# Patient Record
Sex: Male | Born: 1965 | Race: White | Hispanic: No | Marital: Married | Smoking: Former smoker
Health system: Southern US, Community
[De-identification: ages and names within clinical notes are randomized; demographics above are authoritative.]

## PROBLEM LIST (undated history)

## (undated) DIAGNOSIS — R51 Headache: Secondary | ICD-10-CM

## (undated) DIAGNOSIS — G4730 Sleep apnea, unspecified: Secondary | ICD-10-CM

## (undated) DIAGNOSIS — M810 Age-related osteoporosis without current pathological fracture: Secondary | ICD-10-CM

## (undated) DIAGNOSIS — U071 COVID-19: Secondary | ICD-10-CM

## (undated) DIAGNOSIS — K529 Noninfective gastroenteritis and colitis, unspecified: Secondary | ICD-10-CM

## (undated) DIAGNOSIS — K219 Gastro-esophageal reflux disease without esophagitis: Secondary | ICD-10-CM

## (undated) DIAGNOSIS — K222 Esophageal obstruction: Secondary | ICD-10-CM

## (undated) DIAGNOSIS — J45909 Unspecified asthma, uncomplicated: Secondary | ICD-10-CM

## (undated) DIAGNOSIS — R112 Nausea with vomiting, unspecified: Secondary | ICD-10-CM

## (undated) DIAGNOSIS — Z9889 Other specified postprocedural states: Secondary | ICD-10-CM

## (undated) DIAGNOSIS — I2510 Atherosclerotic heart disease of native coronary artery without angina pectoris: Secondary | ICD-10-CM

## (undated) DIAGNOSIS — Z8719 Personal history of other diseases of the digestive system: Secondary | ICD-10-CM

## (undated) DIAGNOSIS — E669 Obesity, unspecified: Secondary | ICD-10-CM

## (undated) DIAGNOSIS — Z87442 Personal history of urinary calculi: Secondary | ICD-10-CM

## (undated) DIAGNOSIS — I10 Essential (primary) hypertension: Secondary | ICD-10-CM

## (undated) DIAGNOSIS — R519 Headache, unspecified: Secondary | ICD-10-CM

## (undated) DIAGNOSIS — E7800 Pure hypercholesterolemia, unspecified: Secondary | ICD-10-CM

## (undated) DIAGNOSIS — R42 Dizziness and giddiness: Secondary | ICD-10-CM

## (undated) DIAGNOSIS — N400 Enlarged prostate without lower urinary tract symptoms: Secondary | ICD-10-CM

## (undated) DIAGNOSIS — T7840XA Allergy, unspecified, initial encounter: Secondary | ICD-10-CM

## (undated) DIAGNOSIS — D510 Vitamin B12 deficiency anemia due to intrinsic factor deficiency: Secondary | ICD-10-CM

## (undated) DIAGNOSIS — M1990 Unspecified osteoarthritis, unspecified site: Secondary | ICD-10-CM

## (undated) HISTORY — DX: Benign prostatic hyperplasia without lower urinary tract symptoms: N40.0

## (undated) HISTORY — PX: ESOPHAGOGASTRODUODENOSCOPY: SHX1529

## (undated) HISTORY — DX: COVID-19: U07.1

## (undated) HISTORY — PX: COLONOSCOPY: SHX174

## (undated) HISTORY — DX: Allergy, unspecified, initial encounter: T78.40XA

## (undated) HISTORY — DX: Unspecified osteoarthritis, unspecified site: M19.90

## (undated) HISTORY — PX: CARPAL TUNNEL RELEASE: SHX101

## (undated) HISTORY — PX: APPENDECTOMY: SHX54

## (undated) HISTORY — DX: Vitamin B12 deficiency anemia due to intrinsic factor deficiency: D51.0

## (undated) HISTORY — DX: Obesity, unspecified: E66.9

## (undated) HISTORY — DX: Esophageal obstruction: K22.2

## (undated) HISTORY — DX: Noninfective gastroenteritis and colitis, unspecified: K52.9

## (undated) HISTORY — DX: Dizziness and giddiness: R42

## (undated) HISTORY — DX: Age-related osteoporosis without current pathological fracture: M81.0

## (undated) HISTORY — DX: Gastro-esophageal reflux disease without esophagitis: K21.9

---
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
## 2003-08-15 ENCOUNTER — Emergency Department (HOSPITAL_COMMUNITY): Admission: EM | Admit: 2003-08-15 | Discharge: 2003-08-15 | Payer: Self-pay | Admitting: Emergency Medicine

## 2004-07-05 ENCOUNTER — Emergency Department (HOSPITAL_COMMUNITY): Admission: EM | Admit: 2004-07-05 | Discharge: 2004-07-05 | Payer: Self-pay | Admitting: *Deleted

## 2004-08-31 ENCOUNTER — Ambulatory Visit: Payer: Self-pay | Admitting: Internal Medicine

## 2004-09-03 ENCOUNTER — Ambulatory Visit: Payer: Self-pay | Admitting: Professional

## 2004-09-07 ENCOUNTER — Ambulatory Visit: Payer: Self-pay | Admitting: Professional

## 2004-10-08 ENCOUNTER — Ambulatory Visit: Payer: Self-pay | Admitting: Professional

## 2004-10-13 ENCOUNTER — Ambulatory Visit: Payer: Self-pay | Admitting: Internal Medicine

## 2004-10-15 ENCOUNTER — Ambulatory Visit: Payer: Self-pay | Admitting: Professional

## 2004-10-21 ENCOUNTER — Ambulatory Visit: Payer: Self-pay | Admitting: Cardiovascular Disease

## 2004-10-22 ENCOUNTER — Ambulatory Visit: Payer: Self-pay | Admitting: Professional

## 2004-10-26 ENCOUNTER — Ambulatory Visit: Payer: Self-pay | Admitting: Cardiology

## 2004-10-27 ENCOUNTER — Inpatient Hospital Stay (HOSPITAL_BASED_OUTPATIENT_CLINIC_OR_DEPARTMENT_OTHER): Admission: RE | Admit: 2004-10-27 | Discharge: 2004-10-27 | Payer: Self-pay | Admitting: Cardiology

## 2004-10-30 ENCOUNTER — Ambulatory Visit: Payer: Self-pay | Admitting: Cardiovascular Disease

## 2004-11-05 ENCOUNTER — Ambulatory Visit: Payer: Self-pay | Admitting: Professional

## 2004-12-01 ENCOUNTER — Ambulatory Visit: Payer: Self-pay

## 2005-01-07 ENCOUNTER — Ambulatory Visit: Payer: Self-pay | Admitting: Internal Medicine

## 2005-01-18 ENCOUNTER — Ambulatory Visit (HOSPITAL_COMMUNITY): Admission: RE | Admit: 2005-01-18 | Discharge: 2005-01-18 | Payer: Self-pay | Admitting: Internal Medicine

## 2006-01-31 ENCOUNTER — Ambulatory Visit: Payer: Self-pay | Admitting: Internal Medicine

## 2007-05-15 ENCOUNTER — Encounter: Payer: Self-pay | Admitting: *Deleted

## 2010-02-24 ENCOUNTER — Emergency Department (HOSPITAL_COMMUNITY): Admission: EM | Admit: 2010-02-24 | Discharge: 2010-02-24 | Payer: Self-pay | Admitting: Emergency Medicine

## 2010-12-14 LAB — DIFFERENTIAL
Basophils Absolute: 0 10*3/uL (ref 0.0–0.1)
Basophils Relative: 1 % (ref 0–1)
Eosinophils Absolute: 0.1 10*3/uL (ref 0.0–0.7)
Eosinophils Relative: 2 % (ref 0–5)
Lymphocytes Relative: 31 % (ref 12–46)
Lymphs Abs: 2.1 10*3/uL (ref 0.7–4.0)
Monocytes Absolute: 0.5 10*3/uL (ref 0.1–1.0)
Monocytes Relative: 8 % (ref 3–12)
Neutro Abs: 3.9 10*3/uL (ref 1.7–7.7)
Neutrophils Relative %: 59 % (ref 43–77)

## 2010-12-14 LAB — BASIC METABOLIC PANEL
BUN: 16 mg/dL (ref 6–23)
CO2: 27 mEq/L (ref 19–32)
Calcium: 9.4 mg/dL (ref 8.4–10.5)
Chloride: 108 mEq/L (ref 96–112)
Creatinine, Ser: 0.87 mg/dL (ref 0.4–1.5)
GFR calc Af Amer: >60 mL/min (ref >60)
GFR calc non Af Amer: >60 mL/min (ref >60)
Glucose, Bld: 115 mg/dL — ABNORMAL HIGH (ref 70–99)
Potassium: 4.4 mEq/L (ref 3.5–5.1)
Sodium: 140 mEq/L (ref 135–145)

## 2010-12-14 LAB — URINALYSIS, ROUTINE W REFLEX MICROSCOPIC
Bilirubin Urine: NEGATIVE
Glucose, UA: NEGATIVE mg/dL
Hgb urine dipstick: NEGATIVE
Ketones, ur: NEGATIVE mg/dL
Nitrite: NEGATIVE
Protein, ur: NEGATIVE mg/dL
Specific Gravity, Urine: 1.021 (ref 1.005–1.030)
Urobilinogen, UA: 0.2 mg/dL (ref 0.0–1.0)
pH: 5.5 (ref 5.0–8.0)

## 2010-12-14 LAB — CBC
HCT: 40.3 % (ref 39.0–52.0)
Hemoglobin: 14.1 g/dL (ref 13.0–17.0)
MCHC: 35 g/dL (ref 30.0–36.0)
MCV: 88.6 fL (ref 78.0–100.0)
Platelets: 270 10*3/uL (ref 150–400)
RBC: 4.56 MIL/uL (ref 4.22–5.81)
RDW: 13.4 % (ref 11.5–15.5)
WBC: 6.7 10*3/uL (ref 4.0–10.5)

## 2011-02-12 NOTE — Cardiovascular Report (Signed)
Taylor Schroeder, Taylor Schroeder               ACCOUNT NO.:  [id number]   MEDICAL RECORD NO.:  [id number]          PATIENT TYPE:  OIB   LOCATION:  6501                         FACILITY:  [facility]   PHYSICIAN:  Arturo Morton. Riley Kill, M.D. [facility] OF BIRTH:  Jul 23, 1966   DATE OF PROCEDURE:  10/27/2004  DATE OF DISCHARGE:                              CARDIAC CATHETERIZATION   INDICATIONS:  Mr. 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 He has been  under quite a bit of stress, and according to his mother just ended a 6-year  relationship. He has been a three pack per day smoker. He also drinks up to  two pots of coffee a day. The patient now presents with intermittent chest  discomfort. According to history, he apparently has had Cardiolite done in  the [facility] which was negative for ischemia, although I do not have  access to this. He has a markedly positive family history. He was seen in  consultation by Dr. Charlton Haws, and referred for diagnostic cardiac  catheterization. He is cut down to one pack of smoking of cigarettes a day.   PROCEDURE:  1.  Left heart catheterization  2.  Selective coronary arteriography.  3.  Selective left ventriculography.   DESCRIPTION OF PROCEDURE:  The patient was brought to the catheterization  laboratory after informed consent, prepped and draped in usual fashion.  Through an anterior puncture, the femoral artery was entered and a 4-French  sheath inserted by Mr. Hobbs.  Central aortic and left ventricular pressures  were measured with a pigtail, ventriculography was performed in the RAO  projection. Following this coronary arteriography was performed with four  [place] standard Judkins catheters. He tolerated the procedure well and there  were no complications. He was taken to the holding area in satisfactory  clinical condition. I proceeded to the viewing room were I reviewed the  angiographic  pictures with his mother and explained the path of physiology  and acute myocardial infarction.  I have also explained this to the patient  in detail.   HEMODYNAMIC DATA:  1.  Left ventricular pressure 107/8.  2.  Left aortic pressure 111/77.  3.  No gradient or pullback across aortic valve.   ANGIOGRAPHIC DATA:  1.  The left main coronary artery is large caliber vessel. It bifurcates      into a left anterior descending artery and into a dominant circumflex      vessel. The LAD courses to the apex. The LAD demonstrates some very      minimal Luminal irregularity. There is a major diagonal branch as well.      The LAD and the diagonal branch are large caliber vessels are free of      critical disease.  2.  The circumflex is a dominant vessel. There is a proximal marginal branch      without significant focal narrowing. There is a second marginal branch      that is a large caliber vessel. It provides a significant portion of the  lateral wall. In this marginal branch is a 40-50% area of eccentric      plaquing. The overall lumen itself appears to be adequate for flow. The      AV circumflex provides flow down the third marginal branch and three      posterolateral branches including a small posterior descending branch.      Other than minor Luminal irregularity,  all these vessels are free of      critical disease. The right coronary artery is a nondominant vessel      providing a single inferior branch.   Ventriculography in the RAO projection reveals preserved global systolic  function without segmental wall motion abnormality and without significant  mitral regurgitation.   CONCLUSION:  1.  Well-preserved overall left ventricular function.  2.  40-50% eccentric plaquing in the circumflex marginal branch,  OM II      without critical focal high-grade narrowing.  3.  Other scattered Luminal irregularities as noted above.   DISPOSITION:  1.  The patient will have follow-up  with Dr. Eden Emms for further evaluation.  2.  We will recommend at least an aspirin daily.  3.  The the patient absolutely needs to stop smoking  4.  Counseling will be helpful.  5.  Other risk factor modification will be important with risk factors      identified given the patient's positive family history.   As I explained to the patient,  given the underlying plaquing in the  circumflex he is at risk for plaque rupture and acute myocardial infarction.  This will need to be kept in mind. Given his habits, I think he is at  increased risk for this. A follow-up will be with Dr. Eden Emms.      TDS/MEDQ  D:  10/27/2004  T:  10/27/2004  Job:  [id number]   cc:   Charlton Haws, M.D.   Rosalyn Gess Norins, M.D. [facility]   CV Laboratory

## 2012-08-29 ENCOUNTER — Ambulatory Visit (INDEPENDENT_AMBULATORY_CARE_PROVIDER_SITE_OTHER): Payer: 59 | Admitting: Internal Medicine

## 2012-08-29 ENCOUNTER — Other Ambulatory Visit (INDEPENDENT_AMBULATORY_CARE_PROVIDER_SITE_OTHER): Payer: 59

## 2012-08-29 ENCOUNTER — Encounter: Payer: Self-pay | Admitting: Internal Medicine

## 2012-08-29 VITALS — BP 136/84 | HR 88 | Temp 97.8°F | Resp 16 | Wt 240.5 lb

## 2012-08-29 DIAGNOSIS — R7309 Other abnormal glucose: Secondary | ICD-10-CM

## 2012-08-29 DIAGNOSIS — R739 Hyperglycemia, unspecified: Secondary | ICD-10-CM | POA: Insufficient documentation

## 2012-08-29 DIAGNOSIS — Z0000 Encounter for general adult medical examination without abnormal findings: Secondary | ICD-10-CM

## 2012-08-29 DIAGNOSIS — J209 Acute bronchitis, unspecified: Secondary | ICD-10-CM | POA: Insufficient documentation

## 2012-08-29 LAB — CBC WITH DIFFERENTIAL/PLATELET
Basophils Absolute: 0.1 10*3/uL (ref 0.0–0.1)
Basophils Relative: 1 % (ref 0.0–3.0)
Eosinophils Absolute: 0.2 10*3/uL (ref 0.0–0.7)
Eosinophils Relative: 2 % (ref 0.0–5.0)
HCT: 42.8 % (ref 39.0–52.0)
Hemoglobin: 14.4 g/dL (ref 13.0–17.0)
Lymphocytes Relative: 24.1 % (ref 12.0–46.0)
Lymphs Abs: 2.6 10*3/uL (ref 0.7–4.0)
MCHC: 33.5 g/dL (ref 30.0–36.0)
MCV: 89.5 fl (ref 78.0–100.0)
Monocytes Absolute: 0.8 10*3/uL (ref 0.1–1.0)
Monocytes Relative: 6.9 % (ref 3.0–12.0)
Neutro Abs: 7.2 10*3/uL (ref 1.4–7.7)
Neutrophils Relative %: 66 % (ref 43.0–77.0)
Platelets: 286 10*3/uL (ref 150.0–400.0)
RBC: 4.78 Mil/uL (ref 4.22–5.81)
RDW: 13.1 % (ref 11.5–14.6)
WBC: 10.9 10*3/uL — ABNORMAL HIGH (ref 4.5–10.5)

## 2012-08-29 LAB — LIPID PANEL
Cholesterol: 212 mg/dL — ABNORMAL HIGH (ref 0–200)
HDL: 53.3 mg/dL (ref >39.00)
Total CHOL/HDL Ratio: 4
Triglycerides: 103 mg/dL (ref 0.0–149.0)
VLDL: 20.6 mg/dL (ref 0.0–40.0)

## 2012-08-29 LAB — COMPREHENSIVE METABOLIC PANEL
ALT: 21 U/L (ref 0–53)
AST: 19 U/L (ref 0–37)
Albumin: 4.1 g/dL (ref 3.5–5.2)
Alkaline Phosphatase: 63 U/L (ref 39–117)
BUN: 17 mg/dL (ref 6–23)
CO2: 26 mEq/L (ref 19–32)
Calcium: 9.3 mg/dL (ref 8.4–10.5)
Chloride: 102 mEq/L (ref 96–112)
Creatinine, Ser: 1 mg/dL (ref 0.4–1.5)
GFR: 89.1 mL/min (ref >60.00)
Glucose, Bld: 100 mg/dL — ABNORMAL HIGH (ref 70–99)
Potassium: 4.4 mEq/L (ref 3.5–5.1)
Sodium: 136 mEq/L (ref 135–145)
Total Bilirubin: 0.6 mg/dL (ref 0.3–1.2)
Total Protein: 7.1 g/dL (ref 6.0–8.3)

## 2012-08-29 LAB — PSA: PSA: 1.54 ng/mL (ref 0.10–4.00)

## 2012-08-29 LAB — URINALYSIS, ROUTINE W REFLEX MICROSCOPIC
Bilirubin Urine: NEGATIVE
Hgb urine dipstick: NEGATIVE
Ketones, ur: NEGATIVE
Leukocytes, UA: NEGATIVE
Nitrite: NEGATIVE
Specific Gravity, Urine: <=1.005 (ref 1.000–1.030)
Total Protein, Urine: NEGATIVE
Urine Glucose: NEGATIVE
Urobilinogen, UA: 0.2 (ref 0.0–1.0)
pH: 6 (ref 5.0–8.0)

## 2012-08-29 LAB — TSH: TSH: 2.49 u[IU]/mL (ref 0.35–5.50)

## 2012-08-29 LAB — LDL CHOLESTEROL, DIRECT: Direct LDL: 147.6 mg/dL

## 2012-08-29 LAB — HEMOGLOBIN A1C: Hgb A1c MFr Bld: 5.7 % (ref 4.6–6.5)

## 2012-08-29 LAB — FECAL OCCULT BLOOD, GUAIAC: Fecal Occult Blood: NEGATIVE

## 2012-08-29 MED ORDER — AZITHROMYCIN 500 MG PO TABS: 500.0000 mg | ORAL_TABLET | Freq: Every day | ORAL | Status: DC

## 2012-08-29 NOTE — Patient Instructions (Signed)
Acute Bronchitis You have acute bronchitis. This means you have a chest cold. The airways in your lungs are red and sore (inflamed). Acute means it is sudden onset.  CAUSES Bronchitis is most often caused by the same virus that causes a cold. SYMPTOMS   Body aches.  Chest congestion.  Chills.  Cough.  Fever.  Shortness of breath.  Sore throat. TREATMENT  Acute bronchitis is usually treated with rest, fluids, and medicines for relief of fever or cough. Most symptoms should go away after a few days or a week. Increased fluids may help thin your secretions and will prevent dehydration. Your caregiver may give you an inhaler to improve your symptoms. The inhaler reduces shortness of breath and helps control cough. You can take over-the-counter pain relievers or cough medicine to decrease coughing, pain, or fever. A cool-air vaporizer may help thin bronchial secretions and make it easier to clear your chest. Antibiotics are usually not needed but can be prescribed if you smoke, are seriously ill, have chronic lung problems, are elderly, or you are at higher risk for developing complications.Allergies and asthma can make bronchitis worse. Repeated episodes of bronchitis may cause longstanding lung problems. Avoid smoking and secondhand smoke.Exposure to cigarette smoke or irritating chemicals will make bronchitis worse. If you are a cigarette smoker, consider using nicotine gum or skin patches to help control withdrawal symptoms. Quitting smoking will help your lungs heal faster. Recovery from bronchitis is often slow, but you should start feeling better after 2 to 3 days. Cough from bronchitis frequently lasts for 3 to 4 weeks. To prevent another bout of acute bronchitis:  Quit smoking.  Wash your hands frequently to get rid of viruses or use a hand sanitizer.  Avoid other people with cold or virus symptoms.  Try not to touch your hands to your mouth, nose, or eyes. SEEK IMMEDIATE  MEDICAL CARE IF:  You develop increased fever, chills, or chest pain.  You have severe shortness of breath or bloody sputum.  You develop dehydration, fainting, repeated vomiting, or a severe headache.  You have no improvement after 1 week of treatment or you get worse. MAKE SURE YOU:   Understand these instructions.  Will watch your condition.  Will get help right away if you are not doing well or get worse. Document Released: 10/21/2004 Document Revised: 12/06/2011 Document Reviewed: 01/06/2011 [facility] Patient Information 2013 [place], [place]. Health Maintenance, Males A healthy lifestyle and preventative care can promote health and wellness.  Maintain regular health, dental, and eye exams.  Eat a healthy diet. Foods like vegetables, fruits, whole grains, low-fat dairy products, and lean protein foods contain the nutrients you need without too many calories. Decrease your intake of foods high in solid fats, added sugars, and salt. Get information about a proper diet from your caregiver, if necessary.  Regular physical exercise is one of the most important things you can do for your health. Most adults should get at least 150 minutes of moderate-intensity exercise (any activity that increases your heart rate and causes you to sweat) each week. In addition, most adults need muscle-strengthening exercises on 2 or more days a week.   Maintain a healthy weight. The body mass index (BMI) is a screening tool to identify possible weight problems. It provides an estimate of body fat based on height and weight. Your caregiver can help determine your BMI, and can help you achieve or maintain a healthy weight. For adults 20 years and older:  A BMI below 18.5  is considered underweight.  A BMI of 18.5 to 24.9 is normal.  A BMI of 25 to 29.9 is considered overweight.  A BMI of 30 and above is considered obese.  Maintain normal blood lipids and cholesterol by exercising and minimizing your  intake of saturated fat. Eat a balanced diet with plenty of fruits and vegetables. Blood tests for lipids and cholesterol should begin at age 10 and be repeated every 5 years. If your lipid or cholesterol levels are high, you are over 50, or you are a high risk for heart disease, you may need your cholesterol levels checked more frequently.Ongoing high lipid and cholesterol levels should be treated with medicines, if diet and exercise are not effective.  If you smoke, find out from your caregiver how to quit. If you do not use tobacco, do not start.  If you choose to drink alcohol, do not exceed 2 drinks per day. One drink is considered to be 12 ounces (355 mL) of beer, 5 ounces (148 mL) of wine, or 1.5 ounces (44 mL) of liquor.  Avoid use of street drugs. Do not share needles with anyone. Ask for help if you need support or instructions about stopping the use of drugs.  High blood pressure causes heart disease and increases the risk of stroke. Blood pressure should be checked at least every 1 to 2 years. Ongoing high blood pressure should be treated with medicines if weight loss and exercise are not effective.  If you are 49 to 47 years old, ask your caregiver if you should take aspirin to prevent heart disease.  Diabetes screening involves taking a blood sample to check your fasting blood sugar level. This should be done once every 3 years, after age 72, if you are within normal weight and without risk factors for diabetes. Testing should be considered at a younger age or be carried out more frequently if you are overweight and have at least 1 risk factor for diabetes.  Colorectal cancer can be detected and often prevented. Most routine colorectal cancer screening begins at the age of 93 and continues through age 53. However, your caregiver may recommend screening at an earlier age if you have risk factors for colon cancer. On a yearly basis, your caregiver may provide home test kits to check for  hidden blood in the stool. Use of a small camera at the end of a tube, to directly examine the colon (sigmoidoscopy or colonoscopy), can detect the earliest forms of colorectal cancer. Talk to your caregiver about this at age 5, when routine screening begins. Direct examination of the colon should be repeated every 5 to 10 years through age 48, unless early forms of pre-cancerous polyps or small growths are found.  Hepatitis C blood testing is recommended for all people born from [birth year] through 1966 and any individual with known risks for hepatitis C.  Healthy men should no longer receive prostate-specific antigen (PSA) blood tests as part of routine cancer screening. Consult with your caregiver about prostate cancer screening.  Testicular cancer screening is not recommended for adolescents or adult males who have no symptoms. Screening includes self-exam, caregiver exam, and other screening tests. Consult with your caregiver about any symptoms you have or any concerns you have about testicular cancer.  Practice safe sex. Use condoms and avoid high-risk sexual practices to reduce the spread of sexually transmitted infections (STIs).  Use sunscreen with a sun protection factor (SPF) of 30 or greater. Apply sunscreen liberally and repeatedly throughout the  day. You should seek shade when your shadow is shorter than you. Protect yourself by wearing long sleeves, pants, a wide-brimmed hat, and sunglasses year round, whenever you are outdoors.  Notify your caregiver of new moles or changes in moles, especially if there is a change in shape or color. Also notify your caregiver if a mole is larger than the size of a pencil eraser.  A one-time screening for abdominal aortic aneurysm (AAA) and surgical repair of large AAAs by sound wave imaging (ultrasonography) is recommended for ages 96 to 48 years who are current or former smokers.  Stay current with your immunizations. Document Released: 03/11/2008  Document Revised: 12/06/2011 Document Reviewed: 02/08/2011 [facility] Patient Information 2013 [place], [place].

## 2012-08-29 NOTE — Progress Notes (Signed)
Subjective:    Patient ID: Taylor Schroeder, male    DOB: 1966-09-17, 46 y.o.   MRN: [id number]  Cough This is a new problem. The current episode started in the past 7 days. The problem has been unchanged. The problem occurs every few hours. The cough is productive of purulent sputum. Associated symptoms include a sore throat. Pertinent negatives include no chest pain, chills, ear congestion, ear pain, fever, headaches, heartburn, hemoptysis, myalgias, nasal congestion, postnasal drip, rash, rhinorrhea, shortness of breath, sweats, weight loss or wheezing. Nothing aggravates the symptoms. He has tried OTC cough suppressant for the symptoms. The treatment provided mild relief. There is no history of asthma, bronchiectasis, bronchitis, COPD, emphysema, environmental allergies or pneumonia.      Review of Systems  Constitutional: Negative for fever, chills, weight loss, diaphoresis, activity change, appetite change, fatigue and unexpected weight change.  HENT: Positive for sore throat. Negative for ear pain, rhinorrhea and postnasal drip.   Eyes: Negative.   Respiratory: Positive for cough. Negative for apnea, hemoptysis, choking, chest tightness, shortness of breath, wheezing and stridor.   Cardiovascular: Negative for chest pain, palpitations and leg swelling.  Gastrointestinal: Negative for heartburn, nausea, vomiting, abdominal pain, diarrhea, constipation and blood in stool.  Genitourinary: Negative.  Negative for urgency, frequency, hematuria, flank pain, decreased urine volume, enuresis and difficulty urinating.  Musculoskeletal: Positive for arthralgias (shoulders and knees). Negative for myalgias, back pain, joint swelling and gait problem.  Skin: Negative for color change, pallor, rash and wound.  Neurological: Negative for dizziness, tremors, seizures, syncope, facial asymmetry, speech difficulty, weakness, light-headedness, numbness and headaches.  Hematological: Negative for  environmental allergies and adenopathy. Does not bruise/bleed easily.  Psychiatric/Behavioral: Negative.        Objective:   Physical Exam  Vitals reviewed. Constitutional: He is oriented to person, place, and time. He appears well-developed and well-nourished. No distress.  HENT:  Head: Normocephalic and atraumatic.  Mouth/Throat: Oropharynx is clear and moist. No oropharyngeal exudate.  Eyes: Conjunctivae normal are normal. Right eye exhibits no discharge. Left eye exhibits no discharge. No scleral icterus.  Neck: Normal range of motion. Neck supple. No JVD present. No tracheal deviation present. No thyromegaly present.  Cardiovascular: Normal rate, regular rhythm, normal heart sounds and intact distal pulses.  Exam reveals no gallop and no friction rub.   No murmur heard. Pulmonary/Chest: Effort normal and breath sounds normal. No stridor. No respiratory distress. He has no wheezes. He has no rales. He exhibits no tenderness.  Abdominal: Soft. Bowel sounds are normal. He exhibits no distension and no mass. There is no tenderness. There is no rebound and no guarding. Hernia confirmed negative in the right inguinal area and confirmed negative in the left inguinal area.  Genitourinary: Rectum normal, prostate normal, testes normal and penis normal. Rectal exam shows no external hemorrhoid, no internal hemorrhoid, no fissure, no mass, no tenderness and anal tone normal. Guaiac negative stool. Prostate is not enlarged and not tender. Right testis shows no mass, no swelling and no tenderness. Right testis is descended. Left testis shows no mass, no swelling and no tenderness. Left testis is descended. Uncircumcised. No phimosis, paraphimosis, hypospadias, penile erythema or penile tenderness. No discharge found.  Musculoskeletal: Normal range of motion. He exhibits no edema and no tenderness.  Lymphadenopathy:    He has no cervical adenopathy.       Right: No inguinal adenopathy present.        Left: No inguinal adenopathy present.  Neurological: He is oriented  to person, place, and time.  Skin: Skin is warm and dry. No rash noted. He is not diaphoretic. No erythema. No pallor.  Psychiatric: He has a normal mood and affect. His behavior is normal. Judgment and thought content normal.      Lab Results  Component Value Date   WBC 6.7 02/24/2010   HGB 14.1 02/24/2010   HCT 40.3 02/24/2010   PLT 270 02/24/2010   GLUCOSE 115* 02/24/2010   NA 140 02/24/2010   K 4.4 02/24/2010   CL 108 02/24/2010   CREATININE 0.87 02/24/2010   BUN 16 02/24/2010   CO2 27 02/24/2010      Assessment & Plan:

## 2012-08-30 NOTE — Assessment & Plan Note (Signed)
Exam done Vaccines were reviewed Labs ordered Pt ed material was given 

## 2012-08-30 NOTE — Assessment & Plan Note (Signed)
I will check his A1C to see if he has developed DM II 

## 2012-08-30 NOTE — Assessment & Plan Note (Signed)
Start Zpak

## 2012-09-27 DIAGNOSIS — K529 Noninfective gastroenteritis and colitis, unspecified: Secondary | ICD-10-CM

## 2012-09-27 HISTORY — DX: Noninfective gastroenteritis and colitis, unspecified: K52.9

## 2012-10-23 ENCOUNTER — Emergency Department (HOSPITAL_COMMUNITY)
Admission: EM | Admit: 2012-10-23 | Discharge: 2012-10-24 | Disposition: A | Discharge status: Home / Self Care | Payer: 59 | Attending: Emergency Medicine | Admitting: Emergency Medicine

## 2012-10-23 ENCOUNTER — Encounter (HOSPITAL_COMMUNITY): Payer: Self-pay | Admitting: Emergency Medicine

## 2012-10-23 ENCOUNTER — Emergency Department (HOSPITAL_COMMUNITY): Payer: 59

## 2012-10-23 DIAGNOSIS — Z79899 Other long term (current) drug therapy: Secondary | ICD-10-CM | POA: Insufficient documentation

## 2012-10-23 DIAGNOSIS — R6889 Other general symptoms and signs: Secondary | ICD-10-CM

## 2012-10-23 DIAGNOSIS — Z8739 Personal history of other diseases of the musculoskeletal system and connective tissue: Secondary | ICD-10-CM | POA: Insufficient documentation

## 2012-10-23 DIAGNOSIS — R52 Pain, unspecified: Secondary | ICD-10-CM | POA: Insufficient documentation

## 2012-10-23 DIAGNOSIS — R509 Fever, unspecified: Secondary | ICD-10-CM | POA: Insufficient documentation

## 2012-10-23 DIAGNOSIS — R0989 Other specified symptoms and signs involving the circulatory and respiratory systems: Secondary | ICD-10-CM | POA: Insufficient documentation

## 2012-10-23 DIAGNOSIS — R0600 Dyspnea, unspecified: Secondary | ICD-10-CM

## 2012-10-23 DIAGNOSIS — K219 Gastro-esophageal reflux disease without esophagitis: Secondary | ICD-10-CM | POA: Insufficient documentation

## 2012-10-23 DIAGNOSIS — R0609 Other forms of dyspnea: Secondary | ICD-10-CM | POA: Insufficient documentation

## 2012-10-23 DIAGNOSIS — J45909 Unspecified asthma, uncomplicated: Secondary | ICD-10-CM | POA: Insufficient documentation

## 2012-10-23 DIAGNOSIS — Z87891 Personal history of nicotine dependence: Secondary | ICD-10-CM | POA: Insufficient documentation

## 2012-10-23 LAB — CBC WITH DIFFERENTIAL/PLATELET
Basophils Absolute: 0 10*3/uL (ref 0.0–0.1)
Basophils Relative: 0 % (ref 0–1)
Eosinophils Absolute: 0.1 10*3/uL (ref 0.0–0.7)
Eosinophils Relative: 1 % (ref 0–5)
HCT: 38.5 % — ABNORMAL LOW (ref 39.0–52.0)
Hemoglobin: 13.5 g/dL (ref 13.0–17.0)
Lymphocytes Relative: 12 % (ref 12–46)
Lymphs Abs: 1.2 10*3/uL (ref 0.7–4.0)
MCH: 30.5 pg (ref 26.0–34.0)
MCHC: 35.1 g/dL (ref 30.0–36.0)
MCV: 86.9 fL (ref 78.0–100.0)
Monocytes Absolute: 0.6 10*3/uL (ref 0.1–1.0)
Monocytes Relative: 6 % (ref 3–12)
Neutro Abs: 7.9 10*3/uL — ABNORMAL HIGH (ref 1.7–7.7)
Neutrophils Relative %: 81 % — ABNORMAL HIGH (ref 43–77)
Platelets: 200 10*3/uL (ref 150–400)
RBC: 4.43 MIL/uL (ref 4.22–5.81)
RDW: 13.3 % (ref 11.5–15.5)
WBC: 9.8 10*3/uL (ref 4.0–10.5)

## 2012-10-23 LAB — BASIC METABOLIC PANEL
BUN: 18 mg/dL (ref 6–23)
CO2: 26 mEq/L (ref 19–32)
Calcium: 8.9 mg/dL (ref 8.4–10.5)
Chloride: 101 mEq/L (ref 96–112)
Creatinine, Ser: 1.03 mg/dL (ref 0.50–1.35)
GFR calc Af Amer: >90 mL/min (ref >90)
GFR calc non Af Amer: 85 mL/min — ABNORMAL LOW (ref >90)
Glucose, Bld: 124 mg/dL — ABNORMAL HIGH (ref 70–99)
Potassium: 3.7 mEq/L (ref 3.5–5.1)
Sodium: 136 mEq/L (ref 135–145)

## 2012-10-23 MED ORDER — ACETAMINOPHEN 325 MG PO TABS
650.0000 mg | ORAL_TABLET | Freq: Once | ORAL | Status: AC
  Administered 2012-10-23: 650 mg via ORAL
  Filled 2012-10-23: qty 2

## 2012-10-23 MED ORDER — HYDROCOD POLST-CHLORPHEN POLST 10-8 MG/5ML PO LQCR
5.0000 mL | Freq: Once | ORAL | Status: AC
  Administered 2012-10-23: 5 mL via ORAL
  Filled 2012-10-23: qty 5

## 2012-10-23 NOTE — ED Notes (Signed)
Pt to ED via [facility] with reports of sudden onset of body aches, elevated temp and productive cough today while driving home from work.

## 2012-10-23 NOTE — ED Provider Notes (Signed)
History     CSN: [id number]  Arrival date & time 10/23/12  2248   First MD Initiated Contact with Patient 10/23/12 2300      Chief Complaint  Patient presents with  . Cough    (Consider location/radiation/quality/duration/timing/severity/associated sxs/prior treatment) HPI 47 year old male presents to emergency department complaining of fever, body aches, cough. Symptoms started this evening around 8:00. Patient reports he's been feeling unwell earlier in the day however, with sinus congestion. 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 He reports it hurts to breathe and cough. He did not get a flu shot this year. Patient is not a current smoker, but has history of heavy smoking. He quit 4 years ago. He reports history of asthma, arthritis. Patient takes 1000 mg of ibuprofen each morning. Last dose this morning. He has not taken anything since that time. Patient presents via EMS, received albuterol neb treatment from EMS.  No sick contacts.   Past Medical History  Diagnosis Date  . Arthritis   . GERD (gastroesophageal reflux disease)     Past Surgical History  Procedure Date  . Appendectomy     Family History  Problem Relation Age of Onset  . Hypertension Mother   . Cancer Father   . Alcohol abuse Neg Hx   . Diabetes Neg Hx   . Early death Neg Hx   . Heart disease Neg Hx   . Hyperlipidemia Neg Hx   . Kidney disease Neg Hx   . Stroke Neg Hx     History  Substance Use Topics  . Smoking status: Former Smoker -- 2.0 packs/day for 30 years    Quit date: 08/29/2008  . Smokeless tobacco: Never Used  . Alcohol Use: No      Review of Systems  See History of Present Illness; otherwise all other systems are reviewed and negative  Allergies  Fexofenadine and Meperidine hcl  Home Medications   Current Outpatient Rx  Name  Route  Sig  Dispense  Refill  . IBUPROFEN PO   Oral   Take 1 tablet by mouth every 6 (six) hours as needed. For pain        . OMEPRAZOLE MAGNESIUM 20 MG PO TBEC   Oral   Take 20 mg by mouth daily.           BP 161/91  Temp 100 F (37.8 C) (Oral)  Resp 20  SpO2 98%  Physical Exam  Nursing note and vitals reviewed. Constitutional: He is oriented to person, place, and time. He appears well-developed and well-nourished. He appears distressed (uncomfortable appearing).  HENT:  Head: Normocephalic and atraumatic.  Nose: Nose normal.  Mouth/Throat: Oropharynx is clear and moist.  Neck: Normal range of motion. Neck supple. No JVD present. No tracheal deviation present. No thyromegaly present.       No stridor  Cardiovascular: Regular rhythm, normal heart sounds and intact distal pulses.  Exam reveals no gallop and no friction rub.   No murmur heard.      Tachycardia noted  Pulmonary/Chest: No stridor. He exhibits tenderness.       Patient is tachypnea. He has cough. No wheezing rales or rhonchi noted  Abdominal: Soft. Bowel sounds are normal. He exhibits no distension and no mass. There is no tenderness. There is no rebound and no guarding.  Musculoskeletal: Normal range of motion. He exhibits no edema and no tenderness.  Lymphadenopathy:    He has no cervical adenopathy.  Neurological:  He is alert and oriented to person, place, and time. He exhibits normal muscle tone. Coordination normal.  Skin: Skin is warm and dry. No rash noted. No erythema. No pallor.    ED Course  Procedures (including critical care time)  Labs Reviewed  CBC WITH DIFFERENTIAL - Abnormal; Notable for the following:    HCT 38.5 (*)     Neutrophils Relative 81 (*)     Neutro Abs 7.9 (*)     All other components within normal limits  BASIC METABOLIC PANEL - Abnormal; Notable for the following:    Glucose, Bld 124 (*)     GFR calc non Af Amer 85 (*)     All other components within normal limits   Dg Chest Port 1 View  10/23/2012  *RADIOLOGY REPORT*  Clinical Data: Fever, cough  PORTABLE CHEST - 1 VIEW  Comparison: Off   Findings: Poor inspiratory effect.  Allowing for this, normal heart size and vascular pattern.  Lungs clear with no effusions.  IMPRESSION: No acute findings.   Original Report Authenticated By: Esperanza Heir, M.D.     Date: 10/23/2012  Rate: 139  Rhythm: sinus tachycardia  QRS Axis: normal  Intervals: normal  ST/T Wave abnormalities: normal  Conduction Disutrbances:none  Narrative Interpretation:   Old EKG Reviewed: none available    1. Flu-like symptoms   2. Dyspnea       MDM  47 year old male with acute onset of febrile illness associated with cough and chest pain. Will get chest x-ray, lab work, suspect viral infection bronchitis versus influenza. No wheezing noted. Will treat fever, cough.       Olivia Mackie, MD 10/24/12 [phone]

## 2012-10-24 MED ORDER — OSELTAMIVIR PHOSPHATE 75 MG PO CAPS
75.0000 mg | ORAL_CAPSULE | Freq: Once | ORAL | Status: AC
  Administered 2012-10-24: 75 mg via ORAL
  Filled 2012-10-24: qty 1

## 2012-10-24 MED ORDER — OXYMETAZOLINE HCL 0.05 % NA SOLN
1.0000 | Freq: Once | NASAL | Status: AC
  Administered 2012-10-24: 1 via NASAL
  Filled 2012-10-24: qty 15

## 2012-10-24 MED ORDER — OSELTAMIVIR PHOSPHATE 75 MG PO CAPS: 75.0000 mg | ORAL_CAPSULE | Freq: Two times a day (BID) | ORAL | Status: DC

## 2012-10-24 MED ORDER — HYDROCOD POLST-CHLORPHEN POLST 10-8 MG/5ML PO LQCR: 5.0000 mL | Freq: Two times a day (BID) | ORAL | Status: DC | PRN

## 2012-10-24 MED ORDER — ALBUTEROL SULFATE HFA 108 (90 BASE) MCG/ACT IN AERS
1.0000 | INHALATION_SPRAY | RESPIRATORY_TRACT | Status: DC | PRN
  Administered 2012-10-24: 2 via RESPIRATORY_TRACT
  Filled 2012-10-24: qty 6.7

## 2012-12-21 ENCOUNTER — Ambulatory Visit (INDEPENDENT_AMBULATORY_CARE_PROVIDER_SITE_OTHER): Payer: 59 | Admitting: Internal Medicine

## 2012-12-21 ENCOUNTER — Encounter: Payer: Self-pay | Admitting: Internal Medicine

## 2012-12-21 VITALS — BP 126/86 | HR 86 | Temp 98.0°F | Resp 16 | Ht 69.0 in | Wt 247.0 lb

## 2012-12-21 DIAGNOSIS — E669 Obesity, unspecified: Secondary | ICD-10-CM

## 2012-12-21 DIAGNOSIS — K219 Gastro-esophageal reflux disease without esophagitis: Secondary | ICD-10-CM

## 2012-12-21 MED ORDER — DEXLANSOPRAZOLE 60 MG PO CPDR: 60.0000 mg | DELAYED_RELEASE_CAPSULE | Freq: Every day | ORAL | Status: DC

## 2012-12-21 MED ORDER — LORCASERIN HCL 10 MG PO TABS: 1.0000 | ORAL_TABLET | Freq: Two times a day (BID) | ORAL | Status: DC

## 2012-12-21 NOTE — Progress Notes (Signed)
Subjective:    Patient ID: Taylor Schroeder, male    DOB: June 23, 1966, 47 y.o.   MRN: [id number]  Gastrophageal Reflux He complains of heartburn and water brash. He reports no abdominal pain, no belching, no chest pain, no choking, no coughing, no dysphagia, no early satiety, no globus sensation, no hoarse voice, no nausea, no sore throat, no stridor, no tooth decay or no wheezing. This is a chronic problem. The current episode started more than 1 year ago. The problem occurs frequently. The problem has been gradually worsening. The heartburn is located in the substernum. The heartburn is of mild intensity. The heartburn wakes him from sleep. The heartburn does not limit his activity. The heartburn doesn't change with position. The symptoms are aggravated by medications. Pertinent negatives include no anemia, fatigue, melena, muscle weakness, orthopnea or weight loss. He has tried a PPI (prilosec) for the symptoms. The treatment provided mild relief.      Review of Systems  Constitutional: Positive for unexpected weight change (weight gain). Negative for fever, chills, weight loss, diaphoresis, activity change, appetite change and fatigue.  HENT: Negative.  Negative for sore throat and hoarse voice.   Eyes: Negative.   Respiratory: Negative.  Negative for apnea, cough, choking, chest tightness, shortness of breath, wheezing and stridor.   Cardiovascular: Negative.  Negative for chest pain, palpitations and leg swelling.  Gastrointestinal: Positive for heartburn. Negative for dysphagia, nausea, vomiting, abdominal pain, diarrhea, constipation and melena.  Endocrine: Negative.   Genitourinary: Negative.   Musculoskeletal: Positive for arthralgias (knees). Negative for myalgias, back pain, joint swelling, gait problem and muscle weakness.  Skin: Negative.  Negative for color change, pallor, rash and wound.  Allergic/Immunologic: Negative.   Neurological: Negative.  Negative for dizziness, tremors,  weakness, light-headedness, numbness and headaches.  Hematological: Negative.  Negative for adenopathy. Does not bruise/bleed easily.  Psychiatric/Behavioral: Negative.        Objective:   Physical Exam  Vitals reviewed. Constitutional: He is oriented to person, place, and time. He appears well-developed and well-nourished. No distress.  HENT:  Head: Normocephalic and atraumatic.  Mouth/Throat: Oropharynx is clear and moist. No oropharyngeal exudate.  Eyes: Conjunctivae are normal. Right eye exhibits no discharge. Left eye exhibits no discharge. No scleral icterus.  Neck: Normal range of motion. Neck supple. No JVD present. No tracheal deviation present. No thyromegaly present.  Cardiovascular: Normal rate, regular rhythm, normal heart sounds and intact distal pulses.  Exam reveals no gallop and no friction rub.   No murmur heard. Pulmonary/Chest: Effort normal and breath sounds normal. No stridor. No respiratory distress. He has no wheezes. He has no rales. He exhibits no tenderness.  Abdominal: Soft. Bowel sounds are normal. He exhibits no distension and no mass. There is no tenderness. There is no rebound and no guarding.  Musculoskeletal: Normal range of motion. He exhibits no edema and no tenderness.  Lymphadenopathy:    He has no cervical adenopathy.  Neurological: He is oriented to person, place, and time.  Skin: Skin is warm and dry. No rash noted. He is not diaphoretic. No erythema. No pallor.  Psychiatric: He has a normal mood and affect. His behavior is normal. Judgment and thought content normal.      Lab Results  Component Value Date   WBC 9.8 10/23/2012   HGB 13.5 10/23/2012   HCT 38.5* 10/23/2012   PLT 200 10/23/2012   GLUCOSE 124* 10/23/2012   CHOL 212* 08/29/2012   TRIG 103.0 08/29/2012   HDL 53.30 08/29/2012  LDLDIRECT 147.6 08/29/2012   ALT 21 08/29/2012   AST 19 08/29/2012   NA 136 10/23/2012   K 3.7 10/23/2012   CL 101 10/23/2012   CREATININE 1.03 10/23/2012   BUN  18 10/23/2012   CO2 26 10/23/2012   TSH 2.49 08/29/2012   PSA 1.54 08/29/2012   HGBA1C 5.7 08/29/2012      Assessment & Plan:

## 2012-12-21 NOTE — Patient Instructions (Signed)

## 2012-12-24 NOTE — Assessment & Plan Note (Signed)
prilosec has not been controlling his symptoms so I have asked him to start dexilant

## 2012-12-24 NOTE — Assessment & Plan Note (Signed)
He will start diet and exercise and will see a nutritionist I have asked him to try belviq

## 2013-01-15 ENCOUNTER — Other Ambulatory Visit: Payer: Self-pay | Admitting: Internal Medicine

## 2013-01-18 ENCOUNTER — Telehealth: Payer: Self-pay | Admitting: Internal Medicine

## 2013-01-18 DIAGNOSIS — E669 Obesity, unspecified: Secondary | ICD-10-CM

## 2013-01-18 MED ORDER — LORCASERIN HCL 10 MG PO TABS: 1.0000 | ORAL_TABLET | Freq: Two times a day (BID) | ORAL | Status: DC

## 2013-01-18 NOTE — Telephone Encounter (Signed)
done

## 2013-01-18 NOTE — Telephone Encounter (Signed)
rx faxed to pharmacy

## 2013-01-18 NOTE — Telephone Encounter (Signed)
Patient requesting refill on Belviq be sent to [facility] on [address]

## 2013-01-19 ENCOUNTER — Telehealth: Payer: Self-pay

## 2013-01-19 NOTE — Telephone Encounter (Signed)
Called insurance [facility], Belviq approved x 3 mos, pharmacy notified

## 2013-01-25 HISTORY — PX: CARDIAC CATHETERIZATION: SHX172

## 2013-01-27 ENCOUNTER — Emergency Department (HOSPITAL_COMMUNITY): Payer: 59

## 2013-01-27 ENCOUNTER — Observation Stay (HOSPITAL_COMMUNITY)
Admission: EM | Admit: 2013-01-27 | Discharge: 2013-01-29 | Disposition: A | Discharge status: Home / Self Care | Payer: 59 | Attending: Cardiology | Admitting: Cardiology

## 2013-01-27 ENCOUNTER — Encounter (HOSPITAL_COMMUNITY): Payer: Self-pay | Admitting: *Deleted

## 2013-01-27 DIAGNOSIS — Z6833 Body mass index (BMI) 33.0-33.9, adult: Secondary | ICD-10-CM | POA: Insufficient documentation

## 2013-01-27 DIAGNOSIS — I2510 Atherosclerotic heart disease of native coronary artery without angina pectoris: Secondary | ICD-10-CM | POA: Insufficient documentation

## 2013-01-27 DIAGNOSIS — E669 Obesity, unspecified: Secondary | ICD-10-CM

## 2013-01-27 DIAGNOSIS — R079 Chest pain, unspecified: Principal | ICD-10-CM

## 2013-01-27 DIAGNOSIS — K219 Gastro-esophageal reflux disease without esophagitis: Secondary | ICD-10-CM | POA: Diagnosis present

## 2013-01-27 HISTORY — DX: Atherosclerotic heart disease of native coronary artery without angina pectoris: I25.10

## 2013-01-27 LAB — PRO B NATRIURETIC PEPTIDE: Pro B Natriuretic peptide (BNP): <5 pg/mL (ref 0–125)

## 2013-01-27 LAB — CBC
HCT: 40.2 % (ref 39.0–52.0)
RBC: 4.71 MIL/uL (ref 4.22–5.81)
RDW: 13.1 % (ref 11.5–15.5)
WBC: 8.6 10*3/uL (ref 4.0–10.5)

## 2013-01-27 LAB — BASIC METABOLIC PANEL
BUN: 21 mg/dL (ref 6–23)
CO2: 26 mEq/L (ref 19–32)
Chloride: 104 mEq/L (ref 96–112)
GFR calc Af Amer: >90 mL/min (ref >90)
Potassium: 3.8 mEq/L (ref 3.5–5.1)

## 2013-01-27 LAB — POCT I-STAT, CHEM 8
Hemoglobin: 11.9 g/dL — ABNORMAL LOW (ref 13.0–17.0)
Sodium: 140 mEq/L (ref 135–145)
TCO2: 26 mmol/L (ref 0–100)

## 2013-01-27 LAB — POCT I-STAT TROPONIN I

## 2013-01-27 MED ORDER — GI COCKTAIL ~~LOC~~
30.0000 mL | Freq: Once | ORAL | Status: AC
  Administered 2013-01-27: 30 mL via ORAL
  Filled 2013-01-27: qty 30

## 2013-01-27 MED ORDER — IOHEXOL 350 MG/ML SOLN
100.0000 mL | Freq: Once | INTRAVENOUS | Status: AC | PRN
  Administered 2013-01-27: 100 mL via INTRAVENOUS

## 2013-01-27 MED ORDER — ONDANSETRON HCL 4 MG/2ML IJ SOLN: 4.0000 mg | Freq: Once | INTRAMUSCULAR | Status: AC

## 2013-01-27 MED ORDER — ONDANSETRON HCL 4 MG/2ML IJ SOLN
INTRAMUSCULAR | Status: AC
  Administered 2013-01-27: 4 mg via INTRAVENOUS
  Filled 2013-01-27: qty 2

## 2013-01-27 MED ORDER — MORPHINE SULFATE 4 MG/ML IJ SOLN
INTRAMUSCULAR | Status: AC
Start: 2013-01-27 — End: 2013-01-27
  Administered 2013-01-27: 4 mg
  Filled 2013-01-27: qty 1

## 2013-01-27 MED ORDER — MORPHINE SULFATE 2 MG/ML IJ SOLN
2.0000 mg | INTRAMUSCULAR | Status: DC | PRN
  Filled 2013-01-27: qty 1

## 2013-01-27 MED ORDER — NITROGLYCERIN 0.4 MG SL SUBL
0.4000 mg | SUBLINGUAL_TABLET | SUBLINGUAL | Status: DC | PRN
  Administered 2013-01-27: 0.4 mg via SUBLINGUAL
  Filled 2013-01-27: qty 25

## 2013-01-27 MED ORDER — ASPIRIN 325 MG PO TABS
325.0000 mg | ORAL_TABLET | ORAL | Status: AC
  Administered 2013-01-27: 325 mg via ORAL
  Filled 2013-01-27: qty 1

## 2013-01-27 MED ORDER — IOHEXOL 350 MG/ML SOLN
80.0000 mL | Freq: Once | INTRAVENOUS | Status: AC | PRN
  Administered 2013-01-27: 80 mL via INTRAVENOUS

## 2013-01-27 NOTE — ED Notes (Signed)
Pt reports pain relief. Pt being transported to radiology now.

## 2013-01-27 NOTE — ED Provider Notes (Signed)
History     CSN: [id number]  Arrival date & time 01/27/13  2123   First MD Initiated Contact with Patient 01/27/13 2134      Chief Complaint  Patient presents with  . Chest Pain    HPI Sudden onset of sharp pain substernal which radiates into the back.  Pain began this afternoon got worse after eating peanuts.  Patient has had some diaphoresis.  No nausea or vomiting. Past Medical History  Diagnosis Date  . Arthritis   . GERD (gastroesophageal reflux disease)   . Coronary artery disease Non-obstructive    a. nonobs cath in 2006;  b. 01/2013 Cath: LM nl, LAD 51m, LCX nl, RCA non-dom, nl. EF 60-65%.    Past Surgical History  Procedure Laterality Date  . Appendectomy    . Cardiac catheterization      Family History  Problem Relation Age of Onset  . Hypertension Mother   . Cancer Father   . Alcohol abuse Neg Hx   . Diabetes Neg Hx   . Early death Neg Hx   . Heart disease Neg Hx   . Hyperlipidemia Neg Hx   . Kidney disease Neg Hx   . Stroke Neg Hx     History  Substance Use Topics  . Smoking status: Former Smoker -- 2.00 packs/day for 30 years    Quit date: 08/29/2008  . Smokeless tobacco: Never Used  . Alcohol Use: 1.2 oz/week    2 Glasses of wine per week      Review of Systems Level V caveat Allergies  Fexofenadine and Meperidine hcl  Home Medications   Current Outpatient Rx  Name  Route  Sig  Dispense  Refill  . Lorcaserin HCl (BELVIQ) 10 MG TABS   Oral   Take 1 tablet by mouth 2 (two) times daily.   60 tablet   5   . omeprazole (PRILOSEC OTC) 20 MG tablet   Oral   Take 20 mg by mouth every other day.         Marland Kitchen aspirin EC 81 MG EC tablet   Oral   Take 1 tablet (81 mg total) by mouth daily.         Marland Kitchen ibuprofen (ADVIL,MOTRIN) 200 MG tablet   Oral   Take 4 tablets (800 mg total) by mouth 2 (two) times daily as needed for pain.   30 tablet        BP 125/74  Pulse 74  Temp(Src) 98 F (36.7 C) (Oral)  Resp 21  Ht 5\' 9"  (1.753 m)  Wt  228 lb 6.3 oz (103.6 kg)  BMI 33.71 kg/m2  SpO2 98%  Physical Exam  Nursing note and vitals reviewed. Constitutional: He is oriented to person, place, and time. He appears well-developed and well-nourished. He appears distressed.  HENT:  Head: Normocephalic and atraumatic.  Eyes: Pupils are equal, round, and reactive to light.  Neck: Normal range of motion.  Cardiovascular: Normal rate and intact distal pulses.   Pulmonary/Chest: No respiratory distress. He has no wheezes. He has no rales.  Abdominal: Normal appearance. He exhibits no distension. There is no tenderness. There is no rebound.  Musculoskeletal: Normal range of motion.  Neurological: He is alert and oriented to person, place, and time. No cranial nerve deficit.  Skin: Skin is warm and dry. No rash noted.  Psychiatric: He has a normal mood and affect. His behavior is normal.    ED Course  Procedures (including critical care time)  Date:  01/27/2013  Rate: 96  Rhythm: normal sinus rhythm  QRS Axis: normal  Intervals: normal  ST/T Wave abnormalities: normal  Conduction Disutrbances: none  Narrative Interpretation: Poor R. wave progression anteriorly which appears to be no different than the EKG from January 2014  Meds ordered this encounter  Medications  . aspirin tablet 325 mg    Sig:   . nitroGLYCERIN (NITROSTAT) SL tablet 0.4 mg    Sig:   . morphine 4 MG/ML injection    Sig:     BULLOCK, NATALIE: cabinet override  . morphine 2 MG/ML injection 2 mg    Sig:   . ondansetron (ZOFRAN) 4 MG/2ML injection    Sig:     BULLOCK, NATALIE: cabinet override  . ibuprofen (ADVIL,MOTRIN) 200 MG tablet    Sig: Take 800 mg by mouth 2 (two) times daily.  Marland Kitchen omeprazole (PRILOSEC OTC) 20 MG tablet    Sig: Take 20 mg by mouth every other day.  . ondansetron (ZOFRAN) injection 4 mg    Sig:   . gi cocktail (Maalox,Lidocaine,Donnatal)    Sig:       Labs Reviewed  CBC - Abnormal; Notable for the following:    MCHC 36.1 (*)     All other components within normal limits  BASIC METABOLIC PANEL - Abnormal; Notable for the following:    GFR calc non Af Amer 85 (*)    All other components within normal limits  CBC WITH DIFFERENTIAL - Abnormal; Notable for the following:    HCT 36.6 (*)    MCHC 36.1 (*)    All other components within normal limits  COMPREHENSIVE METABOLIC PANEL - Abnormal; Notable for the following:    Glucose, Bld 116 (*)    All other components within normal limits  BASIC METABOLIC PANEL - Abnormal; Notable for the following:    Glucose, Bld 103 (*)    All other components within normal limits  CBC - Abnormal; Notable for the following:    HCT 38.1 (*)    All other components within normal limits  HEPARIN LEVEL (UNFRACTIONATED) - Abnormal; Notable for the following:    Heparin Unfractionated 0.26 (*)    All other components within normal limits  POCT I-STAT, CHEM 8 - Abnormal; Notable for the following:    Hemoglobin 11.9 (*)    HCT 35.0 (*)    All other components within normal limits  MRSA PCR SCREENING  PRO B NATRIURETIC PEPTIDE  TROPONIN I  TROPONIN I  TROPONIN I  PROTIME-INR  APTT  PRO B NATRIURETIC PEPTIDE  LIPID PANEL  HEPARIN LEVEL (UNFRACTIONATED)  POCT I-STAT TROPONIN I  POCT ACTIVATED CLOTTING TIME   No results found.   1. Chest pain   2. GERD (gastroesophageal reflux disease)   3. Obesity, unspecified       MDM  Patient seen and evaluated by cardiology.  Admitted to hospital.  Patient much improved after pain management.        Nelia Shi, MD 02/01/13 [phone]

## 2013-01-27 NOTE — ED Notes (Signed)
Pt states c/o Constant sharp shooting CP that radiates to his back. Pain relived by nothing.

## 2013-01-27 NOTE — Consult Note (Deleted)
Admit date: 01/27/2013 Referring Physician  Dr. Radford Pax Primary Physician  Dr. Yetta Barre Primary Cardiologist  Dr. Riley Kill Reason for Consultation  Chest pain  HPI: Thisi s a 46yo WM with a history of CAD with remote cath in 2006 showing luminal irregularities of the LAD, 40-50% eccentric second marginal of the left circ and nondominant normal RCA who presented to the ER with complaints of chest pain.  He says that the pain started around 2pm while sitting and was described as a dull aching sensation.  It was constant since 2pm and then around 7pm the pain became much worse when driving home with radiation into his back and was a sharp as well as pressure pain.  He says it felt like someone was stabbing him as well as feeling like he had a stitch in his back.  He was SOB as well and it was worth when taking a deep breath in.  He pulled over to the side of the road and vomited but did not feel better.  He arrived in the ER and was given SL NTG and morphine.  He thinks the NTG helped some.  He says that now the sharp intense pain is gone but there is still a dull ache in the midsternal part of his chest.  He denied any radiation into his arms.  Because of the radiation into his back a chest CT angio was done which did not show any aortic dissection but did show CAD of the LAD.  Currently is CP is rated a 2/10 and did not improve after GI cocktail.     PMH:   Past Medical History  Diagnosis Date  . Arthritis    CAD with 40-50% OM2 by cath 2006   . GERD (gastroesophageal reflux disease)      PSH:   Past Surgical History  Procedure Laterality Date  . Appendectomy      Allergies:  Fexofenadine and Meperidine hcl Prior to Admit Meds:   (Not in a hospital admission) Fam HX:    Family History  Problem Relation Age of Onset  . Hypertension Mother   . Cancer Father   . Alcohol abuse Neg Hx   . Diabetes Neg Hx   . Early death Neg Hx   . Heart disease Neg Hx   . Hyperlipidemia Neg Hx   . Kidney  disease Neg Hx   . Stroke Neg Hx    Social HX:    History   Social History  . Marital Status: Married    Spouse Name: N/A    Number of Children: N/A  . Years of Education: N/A   Occupational History  . Not on file.   Social History Main Topics  . Smoking status: Former Smoker -- 2.00 packs/day for 30 years    Quit date: 08/29/2008  . Smokeless tobacco: Never Used  . Alcohol Use: No  . Drug Use: No  . Sexually Active: Yes   Other Topics Concern  . Not on file   Social History Narrative  . No narrative on file     ROS:  All 11 ROS were addressed and are negative except what is stated in the HPI  Physical Exam: Blood pressure 123/77, pulse 66, resp. rate 16, SpO2 100.00%.    General: Well developed, well nourished, in no acute distress Head: Eyes PERRLA, No xanthomas.   Normal cephalic and atramatic  Lungs:   Clear bilaterally to auscultation and percussion. Heart:   HRRR S1 S2 Pulses are  2+ & equal.            No carotid bruit. No JVD.  No abdominal bruits. No femoral bruits. Abdomen: Bowel sounds are positive, abdomen soft and non-tender without masses or                  Hernia's noted. Msk:  Back normal, normal gait. Normal strength and tone for age. Extremities:   No clubbing, cyanosis or edema.  DP +1 Neuro: Alert and oriented X 3. Psych:  Good affect, responds appropriately    Labs:   Lab Results  Component Value Date   WBC 8.6 01/27/2013   HGB 11.9* 01/27/2013   HCT 35.0* 01/27/2013   MCV 85.4 01/27/2013   PLT 253 01/27/2013    Recent Labs Lab 01/27/13 2133 01/27/13 2220  NA 140 140  K 3.8 3.6  CL 104 103  CO2 26  --   BUN 21 22  CREATININE 1.03 0.90  CALCIUM 9.5  --   GLUCOSE 95 89   No results found for this basename: PTT   No results found for this basename: INR, PROTIME   No results found for this basename: CKTOTAL, CKMB, CKMBINDEX, TROPONINI     Lab Results  Component Value Date   CHOL 212* 08/29/2012   Lab Results  Component Value  Date   HDL 53.30 08/29/2012   No results found for this basename: [facility]   Lab Results  Component Value Date   TRIG 103.0 08/29/2012   Lab Results  Component Value Date   CHOLHDL 4 08/29/2012   Lab Results  Component Value Date   LDLDIRECT 147.6 08/29/2012      Radiology:  Dg Chest Port 1 View  01/27/2013  *RADIOLOGY REPORT*  Clinical Data: Chest pain  PORTABLE CHEST - 1 VIEW  Comparison: Prior chest x-ray 10/23/2012  Findings: Low inspiratory volumes with mild bibasilar atelectasis. Stable cardiomegaly.  No edema, large effusion or pneumothorax. No acute osseous abnormality.  Degenerative changes in the left glenohumeral joint.  IMPRESSION: Low inspiratory volumes with mild bibasilar atelectasis.  Stable cardiomegaly.   Original Report Authenticated By: [name], M.D.    Ct Angio Chest Aorta W/cm &/or Wo/cm  01/27/2013  *RADIOLOGY REPORT*  Clinical Data: Chest pain, evaluate for dissection  CT ANGIOGRAPHY CHEST  Technique:  Multidetector CT imaging of the chest using the standard protocol during bolus administration of intravenous contrast. Multiplanar reconstructed images including MIPs were obtained and reviewed to evaluate the vascular anatomy.  Contrast: OMNIPAQUE IOHEXOL 350 MG/ML SOLN, 80mL OMNIPAQUE IOHEXOL 350 MG/ML SOLN  Comparison: Chest x-ray obtained earlier today  Findings:  Mediastinum: Unremarkable CT appearance of the thyroid gland.  No suspicious mediastinal or hilar adenopathy.  No soft tissue mediastinal mass.  The thoracic esophagus is unremarkable.  Heart/Vascular: No high attenuation within the aortic media on the noncontrasted view to suggest intramural hematoma. There is a bovine configuration of the aortic arch (two vessel arch with common origin of the brachiocephalic and left common carotid arteries), a normal anatomic variant.  No aortic dissection or dilatation.  Adequate opacification of the pulmonary arteries to the subsegmental level.  No evidence of  central filling defect to suggest acute pulmonary embolism.  Heart is within normal limits for size.  No pericardial effusion.  Probable trace atherosclerotic calcifications in the left anterior descending coronary artery.  Lungs/Pleura: Trace biapical emphysema.  Mild dependent atelectasis in the lower lobes.  Otherwise, the lungs are clear.  Upper Abdomen: Small  stone noted in the gallbladder neck/proximal cystic duct.  No significant gallbladder wall thickening or distension.  Bones: No acute fracture or aggressive appearing lytic or blastic osseous lesion.  IMPRESSION:  1.  Negative for acute dissection, intramural hematoma, pulmonary embolism or other acute cardiopulmonary abnormality. 2. Probable coronary artery disease as identified by the presence of calcified vascular plaque in the left anterior descending coronary artery 3.  Trace biapical pulmonary emphysema 4.  Cholelithiasis   Original Report Authenticated By: [name], M.D.     EKG:  NSR with no ST changes  ASSESSMENT:  1.  Chest pain syndrome with typical and atypical features in a patient with history of nonobstructive left circumflex disease and chest CT showing coronary artery calcifications in the LAD.  He has 2/10 CP that persists but EKG is normal and initiial cardiac enzymes are negative. 2.  GERD 3.  Arthritis  PLAN:   1.  Admit to tele bed 2.  Cycle cardiac enzymes 3.  IV NTG 4.  IV Heparin gtt 5.  ASA/beta blocker 6.  Check d-dimer 7.  Cardiac cath on Monday to evaluate coronary anatomy 8.  Check fasting lipid panel in am  Quintella Reichert, MD  01/27/2013  11:56 PM

## 2013-01-27 NOTE — ED Notes (Signed)
CT scan unsuccessful during 1st try, had to redo a 2nd time.

## 2013-01-28 ENCOUNTER — Encounter (HOSPITAL_COMMUNITY): Payer: Self-pay | Admitting: *Deleted

## 2013-01-28 DIAGNOSIS — R079 Chest pain, unspecified: Secondary | ICD-10-CM

## 2013-01-28 LAB — COMPREHENSIVE METABOLIC PANEL
ALT: 18 U/L (ref 0–53)
AST: 14 U/L (ref 0–37)
Alkaline Phosphatase: 59 U/L (ref 39–117)
Calcium: 9.1 mg/dL (ref 8.4–10.5)
Potassium: 4 mEq/L (ref 3.5–5.1)
Sodium: 137 mEq/L (ref 135–145)
Total Protein: 6.5 g/dL (ref 6.0–8.3)

## 2013-01-28 LAB — PROTIME-INR
INR: 1.05 (ref 0.00–1.49)
Prothrombin Time: 13.6 seconds (ref 11.6–15.2)

## 2013-01-28 LAB — CBC WITH DIFFERENTIAL/PLATELET
Basophils Absolute: 0 10*3/uL (ref 0.0–0.1)
Eosinophils Absolute: 0.1 10*3/uL (ref 0.0–0.7)
Eosinophils Relative: 1 % (ref 0–5)
Lymphocytes Relative: 24 % (ref 12–46)
MCH: 30.3 pg (ref 26.0–34.0)
MCV: 84.1 fL (ref 78.0–100.0)
Neutrophils Relative %: 69 % (ref 43–77)
Platelets: 217 10*3/uL (ref 150–400)
RBC: 4.35 MIL/uL (ref 4.22–5.81)
RDW: 13.1 % (ref 11.5–15.5)
WBC: 9.1 10*3/uL (ref 4.0–10.5)

## 2013-01-28 LAB — HEPARIN LEVEL (UNFRACTIONATED): Heparin Unfractionated: 0.34 IU/mL (ref 0.30–0.70)

## 2013-01-28 LAB — LIPID PANEL
HDL: 50 mg/dL (ref >39)
LDL Cholesterol: 87 mg/dL (ref 0–99)
Total CHOL/HDL Ratio: 3.1 RATIO
Triglycerides: 89 mg/dL (ref <150)
VLDL: 18 mg/dL (ref 0–40)

## 2013-01-28 LAB — BASIC METABOLIC PANEL
Calcium: 9.2 mg/dL (ref 8.4–10.5)
Creatinine, Ser: 0.97 mg/dL (ref 0.50–1.35)
GFR calc Af Amer: >90 mL/min (ref >90)
GFR calc non Af Amer: >90 mL/min (ref >90)

## 2013-01-28 LAB — TROPONIN I
Troponin I: <0.3 ng/mL (ref <0.30)
Troponin I: <0.3 ng/mL (ref <0.30)

## 2013-01-28 MED ORDER — ONDANSETRON HCL 4 MG/2ML IJ SOLN: 4.0000 mg | Freq: Four times a day (QID) | INTRAMUSCULAR | Status: DC | PRN

## 2013-01-28 MED ORDER — SODIUM CHLORIDE 0.9 % IV SOLN: 250.0000 mL | INTRAVENOUS | Status: DC | PRN

## 2013-01-28 MED ORDER — ASPIRIN EC 81 MG PO TBEC
81.0000 mg | DELAYED_RELEASE_TABLET | Freq: Every day | ORAL | Status: DC
  Administered 2013-01-29: 81 mg via ORAL
  Filled 2013-01-28: qty 1

## 2013-01-28 MED ORDER — PANTOPRAZOLE SODIUM 40 MG PO TBEC
40.0000 mg | DELAYED_RELEASE_TABLET | ORAL | Status: DC
  Administered 2013-01-28: 40 mg via ORAL
  Filled 2013-01-28: qty 1

## 2013-01-28 MED ORDER — NITROGLYCERIN IN D5W 200-5 MCG/ML-% IV SOLN
3.0000 ug/min | INTRAVENOUS | Status: DC
  Administered 2013-01-28: 5 ug/min via INTRAVENOUS
  Filled 2013-01-28: qty 250

## 2013-01-28 MED ORDER — HEPARIN (PORCINE) IN NACL 100-0.45 UNIT/ML-% IJ SOLN
1350.0000 [IU]/h | INTRAMUSCULAR | Status: DC
  Administered 2013-01-28: 1350 [IU]/h via INTRAVENOUS
  Filled 2013-01-28 (×2): qty 250

## 2013-01-28 MED ORDER — HEPARIN (PORCINE) IN NACL 100-0.45 UNIT/ML-% IJ SOLN
1450.0000 [IU]/h | INTRAMUSCULAR | Status: DC
  Administered 2013-01-28: 1450 [IU]/h via INTRAVENOUS
  Filled 2013-01-28 (×2): qty 250

## 2013-01-28 MED ORDER — ATORVASTATIN CALCIUM 20 MG PO TABS
20.0000 mg | ORAL_TABLET | Freq: Every day | ORAL | Status: DC
  Administered 2013-01-28 – 2013-01-29 (×2): 20 mg via ORAL
  Filled 2013-01-28 (×2): qty 1

## 2013-01-28 MED ORDER — METOPROLOL TARTRATE 12.5 MG HALF TABLET
12.5000 mg | ORAL_TABLET | Freq: Two times a day (BID) | ORAL | Status: DC
  Administered 2013-01-28 – 2013-01-29 (×4): 12.5 mg via ORAL
  Filled 2013-01-28 (×5): qty 1

## 2013-01-28 MED ORDER — NITROGLYCERIN 0.4 MG SL SUBL: 0.4000 mg | SUBLINGUAL_TABLET | SUBLINGUAL | Status: DC | PRN

## 2013-01-28 MED ORDER — SODIUM CHLORIDE 0.9 % IJ SOLN
3.0000 mL | Freq: Two times a day (BID) | INTRAMUSCULAR | Status: DC
  Administered 2013-01-28 – 2013-01-29 (×3): 3 mL via INTRAVENOUS

## 2013-01-28 MED ORDER — HEPARIN BOLUS VIA INFUSION
4000.0000 [IU] | Freq: Once | INTRAVENOUS | Status: AC
  Administered 2013-01-28: 4000 [IU] via INTRAVENOUS
  Filled 2013-01-28: qty 4000

## 2013-01-28 MED ORDER — SODIUM CHLORIDE 0.9 % IJ SOLN: 3.0000 mL | INTRAMUSCULAR | Status: DC | PRN

## 2013-01-28 MED ORDER — OMEPRAZOLE MAGNESIUM 20 MG PO TBEC
20.0000 mg | DELAYED_RELEASE_TABLET | ORAL | Status: DC
Start: 2013-01-28 — End: 2013-01-28

## 2013-01-28 MED ORDER — ACETAMINOPHEN 325 MG PO TABS: 650.0000 mg | ORAL_TABLET | ORAL | Status: DC | PRN

## 2013-01-28 MED ORDER — SODIUM CHLORIDE 0.9 % IV SOLN
INTRAVENOUS | Status: DC
  Administered 2013-01-28: 03:00:00 via INTRAVENOUS
  Administered 2013-01-29: 10 mL/h via INTRAVENOUS

## 2013-01-28 MED ORDER — SODIUM CHLORIDE 0.9 % IV SOLN
INTRAVENOUS | Status: DC
  Administered 2013-01-29: 75 mL/h via INTRAVENOUS

## 2013-01-28 NOTE — Progress Notes (Signed)
ANTICOAGULATION CONSULT NOTE - Initial Consult  Pharmacy Consult for Heparin Indication: chest pain/ACS  Allergies  Allergen Reactions  . Fexofenadine Other (See Comments)    Unknown-reaction  . Meperidine Hcl     "bad things" happen when I take it    Patient Measurements: Height: 5' 8.9" (175 cm) Weight: 246 lb 14.6 oz (112 kg) IBW/kg (Calculated) : 70.47 Heparin Dosing Weight: 95 kg   Vital Signs: BP: 120/82 mmHg (05/04 0030) Pulse Rate: 68 (05/04 0030)  Labs:  Recent Labs  01/27/13 2133 01/27/13 2220  HGB 14.5 11.9*  HCT 40.2 35.0*  PLT 253  --   CREATININE 1.03 0.90    Estimated Creatinine Clearance: 125 ml/min (by C-G formula based on Cr of 0.9).   Medical History: Past Medical History  Diagnosis Date  . Arthritis   . GERD (gastroesophageal reflux disease)     Medications:  Prescriptions prior to admission  Medication Sig Dispense Refill  . ibuprofen (ADVIL,MOTRIN) 200 MG tablet Take 800 mg by mouth 2 (two) times daily.      . Lorcaserin HCl (BELVIQ) 10 MG TABS Take 1 tablet by mouth 2 (two) times daily.  60 tablet  5  . omeprazole (PRILOSEC OTC) 20 MG tablet Take 20 mg by mouth every other day.        Assessment: 47 yo male with chest pain for heparin Goal of Therapy:  Heparin level 0.3-0.7 units/ml Monitor platelets by anticoagulation protocol: Yes   Plan:  Heparin 4000 units IV bolus, then 1350 units/hr Check heparin level in 6 hours.  Eddie Candle 01/28/2013,1:24 AM

## 2013-01-28 NOTE — Progress Notes (Signed)
SUBJECTIVE:  Patient admitted early this am.  For cath on Monday for evaluation of chest pain.   Mild chest soreness ongoing.    PHYSICAL EXAM Filed Vitals:   01/28/13 0230 01/28/13 0300 01/28/13 0500 01/28/13 0744  BP: 125/71 130/87 113/74 112/79  Pulse: 66 60 50 60  Temp:    97.6 F (36.4 C)  TempSrc:    Oral  Resp: 12 12 12 11   Height:      Weight:      SpO2: 100% 100% 99% 99%   General:  No distress Lungs:  Clear Heart:  RRR Abdomen:  Positive bowel sounds, no rebound no guarding Extremities:  No edema  LABS: Lab Results  Component Value Date   TROPONINI <0.30 01/28/2013   Results for orders placed during the hospital encounter of 01/27/13 (from the past 24 hour(s))  CBC     Status: Abnormal   Collection Time    01/27/13  9:33 PM      Result Value Range   WBC 8.6  4.0 - 10.5 K/uL   RBC 4.71  4.22 - 5.81 MIL/uL   Hemoglobin 14.5  13.0 - 17.0 g/dL   HCT 16.1  09.6 - 04.5 %   MCV 85.4  78.0 - 100.0 fL   MCH 30.8  26.0 - 34.0 pg   MCHC 36.1 (*) 30.0 - 36.0 g/dL   RDW 40.9  81.1 - 91.4 %   Platelets 253  150 - 400 K/uL  BASIC METABOLIC PANEL     Status: Abnormal   Collection Time    01/27/13  9:33 PM      Result Value Range   Sodium 140  135 - 145 mEq/L   Potassium 3.8  3.5 - 5.1 mEq/L   Chloride 104  96 - 112 mEq/L   CO2 26  19 - 32 mEq/L   Glucose, Bld 95  70 - 99 mg/dL   BUN 21  6 - 23 mg/dL   Creatinine, Ser 7.82  0.50 - 1.35 mg/dL   Calcium 9.5  8.4 - 95.6 mg/dL   GFR calc non Af Amer 85 (*) >90 mL/min   GFR calc Af Amer >90  >90 mL/min  PRO B NATRIURETIC PEPTIDE     Status: None   Collection Time    01/27/13  9:33 PM      Result Value Range   Pro B Natriuretic peptide (BNP) <5.0  0 - 125 pg/mL  POCT I-STAT TROPONIN I     Status: None   Collection Time    01/27/13  9:50 PM      Result Value Range   Troponin i, poc 0.00  0.00 - 0.08 ng/mL   Comment 3           POCT I-STAT, CHEM 8     Status: Abnormal   Collection Time    01/27/13 10:20 PM   Result Value Range   Sodium 140  135 - 145 mEq/L   Potassium 3.6  3.5 - 5.1 mEq/L   Chloride 103  96 - 112 mEq/L   BUN 22  6 - 23 mg/dL   Creatinine, Ser 2.13  0.50 - 1.35 mg/dL   Glucose, Bld 89  70 - 99 mg/dL   Calcium, Ion 0.86  5.78 - 1.23 mmol/L   TCO2 26  0 - 100 mmol/L   Hemoglobin 11.9 (*) 13.0 - 17.0 g/dL   HCT 46.9 (*) 62.9 - 52.8 %  TROPONIN I  Status: None   Collection Time    01/28/13  1:17 AM      Result Value Range   Troponin I <0.30  <0.30 ng/mL  PRO B NATRIURETIC PEPTIDE     Status: None   Collection Time    01/28/13  1:17 AM      Result Value Range   Pro B Natriuretic peptide (BNP) <5.0  0 - 125 pg/mL  PROTIME-INR     Status: None   Collection Time    01/28/13  1:18 AM      Result Value Range   Prothrombin Time 13.6  11.6 - 15.2 seconds   INR 1.05  0.00 - 1.49  APTT     Status: None   Collection Time    01/28/13  1:18 AM      Result Value Range   aPTT 33  24 - 37 seconds  CBC WITH DIFFERENTIAL     Status: Abnormal   Collection Time    01/28/13  1:18 AM      Result Value Range   WBC 9.1  4.0 - 10.5 K/uL   RBC 4.35  4.22 - 5.81 MIL/uL   Hemoglobin 13.2  13.0 - 17.0 g/dL   HCT 96.0 (*) 45.4 - 09.8 %   MCV 84.1  78.0 - 100.0 fL   MCH 30.3  26.0 - 34.0 pg   MCHC 36.1 (*) 30.0 - 36.0 g/dL   RDW 11.9  14.7 - 82.9 %   Platelets 217  150 - 400 K/uL   Neutrophils Relative 69  43 - 77 %   Neutro Abs 6.3  1.7 - 7.7 K/uL   Lymphocytes Relative 24  12 - 46 %   Lymphs Abs 2.2  0.7 - 4.0 K/uL   Monocytes Relative 6  3 - 12 %   Monocytes Absolute 0.5  0.1 - 1.0 K/uL   Eosinophils Relative 1  0 - 5 %   Eosinophils Absolute 0.1  0.0 - 0.7 K/uL   Basophils Relative 0  0 - 1 %   Basophils Absolute 0.0  0.0 - 0.1 K/uL  COMPREHENSIVE METABOLIC PANEL     Status: Abnormal   Collection Time    01/28/13  1:18 AM      Result Value Range   Sodium 137  135 - 145 mEq/L   Potassium 4.0  3.5 - 5.1 mEq/L   Chloride 104  96 - 112 mEq/L   CO2 25  19 - 32 mEq/L    Glucose, Bld 116 (*) 70 - 99 mg/dL   BUN 21  6 - 23 mg/dL   Creatinine, Ser 5.62  0.50 - 1.35 mg/dL   Calcium 9.1  8.4 - 13.0 mg/dL   Total Protein 6.5  6.0 - 8.3 g/dL   Albumin 3.6  3.5 - 5.2 g/dL   AST 14  0 - 37 U/L   ALT 18  0 - 53 U/L   Alkaline Phosphatase 59  39 - 117 U/L   Total Bilirubin 0.3  0.3 - 1.2 mg/dL   GFR calc non Af Amer >90  >90 mL/min   GFR calc Af Amer >90  >90 mL/min  MRSA PCR SCREENING     Status: None   Collection Time    01/28/13  1:28 AM      Result Value Range   MRSA by PCR NEGATIVE  NEGATIVE  LIPID PANEL     Status: None   Collection Time    01/28/13  4:25 AM      Result Value Range   Cholesterol 155  0 - 200 mg/dL   Triglycerides 89  <409 mg/dL   HDL 50  >81 mg/dL   Total CHOL/HDL Ratio 3.1     VLDL 18  0 - 40 mg/dL   LDL Cholesterol 87  0 - 99 mg/dL  BASIC METABOLIC PANEL     Status: Abnormal   Collection Time    01/28/13  4:25 AM      Result Value Range   Sodium 139  135 - 145 mEq/L   Potassium 3.8  3.5 - 5.1 mEq/L   Chloride 106  96 - 112 mEq/L   CO2 28  19 - 32 mEq/L   Glucose, Bld 103 (*) 70 - 99 mg/dL   BUN 19  6 - 23 mg/dL   Creatinine, Ser 1.91  0.50 - 1.35 mg/dL   Calcium 9.2  8.4 - 47.8 mg/dL   GFR calc non Af Amer >90  >90 mL/min   GFR calc Af Amer >90  >90 mL/min    Intake/Output Summary (Last 24 hours) at 01/28/13 0823 Last data filed at 01/28/13 0700  Gross per 24 hour  Intake 115.73 ml  Output   1250 ml  Net -1134.27 ml    EKG:  Sinus rhythm, rate 60, axis within normal limits, intervals within normal limits, no acute ST-T wave changes.  01/28/2013  ASSESSMENT AND PLAN:  CHEST PAIN:   Cath Monday.  Continue heparin and IV NTG.        Fayrene Fearing [facility] 01/28/2013 8:23 AM

## 2013-01-28 NOTE — H&P (Signed)
Admit date: 01/27/2013 Referring Physician  Dr. Radford Pax Primary Physician  Dr. Yetta Barre Primary Cardiologist  Dr. Riley Kill Reason for Consultation  Chest pain   HPI: Thisi s a 47yo WM with a history of CAD with remote cath in 2006 showing luminal irregularities of the LAD, 40-50% eccentric second marginal of the left circ and nondominant normal RCA who presented to the ER with complaints of chest pain.  He says that the pain started around 2pm while sitting and was described as a dull aching sensation.  It was constant since 2pm and then around 7pm the pain became much worse when driving home with radiation into his back and was a sharp as well as pressure pain.  He says it felt like someone was stabbing him as well as feeling like he had a stitch in his back.  He was SOB as well and it was worth when taking a deep breath in.  He pulled over to the side of the road and vomited but did not feel better.  He arrived in the ER and was given SL NTG and morphine.  He thinks the NTG helped some.  He says that now the sharp intense pain is gone but there is still a dull ache in the midsternal part of his chest.  He denied any radiation into his arms.  Because of the radiation into his back a chest CT angio was done which did not show any aortic dissection but did show CAD of the LAD.  Currently is CP is rated a 2/10 and did not improve after GI cocktail.        PMH:    Past Medical History   Diagnosis  Date   .  Arthritis       CAD with 40-50% OM2 by cath 2006     .  GERD (gastroesophageal reflux disease)         PSH:   Past Surgical History   Procedure  Laterality  Date   .  Appendectomy         Allergies:  Fexofenadine and Meperidine hcl Prior to Admit Meds:   (Not in a hospital admission) Fam HX:     Family History   Problem  Relation  Age of Onset   .  Hypertension  Mother     .  Cancer  Father     .  Alcohol abuse  Neg Hx     .  Diabetes  Neg Hx     .  Early death  Neg Hx     .  Heart  disease  Neg Hx     .  Hyperlipidemia  Neg Hx     .  Kidney disease  Neg Hx     .  Stroke  Neg Hx      Social HX:     History       Social History   .  Marital Status:  Married       Spouse Name:  N/A       Number of Children:  N/A   .  Years of Education:  N/A       Occupational History   .  Not on file.       Social History Main Topics   .  Smoking status:  Former Smoker -- 2.00 packs/day for 30 years       Quit date:  08/29/2008   .  Smokeless tobacco:  Never Used   .  Alcohol  Use:  No   .  Drug Use:  No   .  Sexually Active:  Yes       Other Topics  Concern   .  Not on file       Social History Narrative   .  No narrative on file      ROS:  All 11 ROS were addressed and are negative except what is stated in the HPI   Physical Exam: Blood pressure 123/77, pulse 66, resp. rate 16, SpO2 100.00%.     General: Well developed, well nourished, in no acute distress Head: Eyes PERRLA, No xanthomas.   Normal cephalic and atramatic       Lungs:   Clear bilaterally to auscultation and percussion. Heart:   HRRR S1 S2 Pulses are 2+ & equal.            No carotid bruit. No JVD.  No abdominal bruits. No femoral bruits. Abdomen: Bowel sounds are positive, abdomen soft and non-tender without masses or                   Hernia's noted. Msk:  Back normal, normal gait. Normal strength and tone for age. Extremities:   No clubbing, cyanosis or edema.  DP +1 Neuro: Alert and oriented X 3. Psych:  Good affect, responds appropriately       Labs:    Lab Results   Component  Value  Date     WBC  8.6  01/27/2013     HGB  11.9*  01/27/2013     HCT  35.0*  01/27/2013     MCV  85.4  01/27/2013     PLT  253  01/27/2013    Recent Labs Lab  01/27/13 2133  01/27/13 2220   NA  140  140   K  3.8  3.6   CL  104  103   CO2  26   --    BUN  21  22   CREATININE  1.03  0.90   CALCIUM  9.5   --    GLUCOSE  95  89    No results found for this basename: PTT    No results found for  this basename: INR, PROTIME    No results found for this basename: CKTOTAL, CKMB, CKMBINDEX, TROPONINI       Lab Results   Component  Value  Date     CHOL  212*  08/29/2012    Lab Results   Component  Value  Date     HDL  53.30  08/29/2012    No results found for this basename: [facility]    Lab Results   Component  Value  Date     TRIG  103.0  08/29/2012    Lab Results   Component  Value  Date     CHOLHDL  4  08/29/2012    Lab Results   Component  Value  Date     LDLDIRECT  147.6  08/29/2012      Radiology:  Dg Chest Port 1 View   01/27/2013  *RADIOLOGY REPORT*  Clinical Data: Chest pain  PORTABLE CHEST - 1 VIEW  Comparison: Prior chest x-ray 10/23/2012  Findings: Low inspiratory volumes with mild bibasilar atelectasis. Stable cardiomegaly.  No edema, large effusion or pneumothorax. No acute osseous abnormality.  Degenerative changes in the left glenohumeral joint.  IMPRESSION: Low inspiratory volumes with mild bibasilar atelectasis.  Stable cardiomegaly.   Original Report Authenticated  By: [name], M.D.     Ct Angio Chest Aorta W/cm &/or Wo/cm   01/27/2013  *RADIOLOGY REPORT*  Clinical Data: Chest pain, evaluate for dissection  CT ANGIOGRAPHY CHEST  Technique:  Multidetector CT imaging of the chest using the standard protocol during bolus administration of intravenous contrast. Multiplanar reconstructed images including MIPs were obtained and reviewed to evaluate the vascular anatomy.  Contrast: OMNIPAQUE IOHEXOL 350 MG/ML SOLN, 80mL OMNIPAQUE IOHEXOL 350 MG/ML SOLN  Comparison: Chest x-ray obtained earlier today  Findings:  Mediastinum: Unremarkable CT appearance of the thyroid gland.  No suspicious mediastinal or hilar adenopathy.  No soft tissue mediastinal mass.  The thoracic esophagus is unremarkable.  Heart/Vascular: No high attenuation within the aortic media on the noncontrasted view to suggest intramural hematoma. There is a bovine configuration of the aortic arch  (two vessel arch with common origin of the brachiocephalic and left common carotid arteries), a normal anatomic variant.  No aortic dissection or dilatation.  Adequate opacification of the pulmonary arteries to the subsegmental level.  No evidence of central filling defect to suggest acute pulmonary embolism.  Heart is within normal limits for size.  No pericardial effusion.  Probable trace atherosclerotic calcifications in the left anterior descending coronary artery.  Lungs/Pleura: Trace biapical emphysema.  Mild dependent atelectasis in the lower lobes.  Otherwise, the lungs are clear.  Upper Abdomen: Small stone noted in the gallbladder neck/proximal cystic duct.  No significant gallbladder wall thickening or distension.  Bones: No acute fracture or aggressive appearing lytic or blastic osseous lesion.  IMPRESSION:  1.  Negative for acute dissection, intramural hematoma, pulmonary embolism or other acute cardiopulmonary abnormality. 2. Probable coronary artery disease as identified by the presence of calcified vascular plaque in the left anterior descending coronary artery 3.  Trace biapical pulmonary emphysema 4.  Cholelithiasis   Original Report Authenticated By: [name], M.D.     EKG:  NSR with no ST changes   ASSESSMENT:   1.  Chest pain syndrome with typical and atypical features in a patient with history of nonobstructive left circumflex disease and chest CT showing coronary artery calcifications in the LAD.  He has 2/10 CP that persists but EKG is normal and initiial cardiac enzymes are negative. 2.  GERD 3.  Arthritis   PLAN:    1.  Admit to tele bed 2.  Cycle cardiac enzymes 3.  IV NTG 4.  IV Heparin gtt 5.  ASA/beta blocker 6.  Check d-dimer 7.  Cardiac cath on Monday to evaluate coronary anatomy 8.  Check fasting lipid panel in am   Quintella Reichert, MD   01/27/2013   11:56 PM         Routing History...     Date/Time From To Method   01/28/2013 12:07 AM Quintella Reichert, MD Etta Grandchild, MD In Basket

## 2013-01-28 NOTE — Progress Notes (Addendum)
Report from Night RN. Chart reviewed together. Handoff complete.Introductions complete. Will continue to monitor and advise attending as needed.   

## 2013-01-29 ENCOUNTER — Encounter (HOSPITAL_COMMUNITY): Payer: Self-pay | Admitting: Nurse Practitioner

## 2013-01-29 ENCOUNTER — Encounter (HOSPITAL_COMMUNITY)
Admission: EM | Disposition: A | Discharge status: Home / Self Care | Payer: Self-pay | Source: Home / Self Care | Attending: Emergency Medicine

## 2013-01-29 DIAGNOSIS — R079 Chest pain, unspecified: Secondary | ICD-10-CM

## 2013-01-29 HISTORY — PX: LEFT HEART CATHETERIZATION WITH CORONARY ANGIOGRAM: SHX5451

## 2013-01-29 LAB — POCT ACTIVATED CLOTTING TIME: Activated Clotting Time: 165 seconds

## 2013-01-29 LAB — HEPARIN LEVEL (UNFRACTIONATED): Heparin Unfractionated: 0.26 IU/mL — ABNORMAL LOW (ref 0.30–0.70)

## 2013-01-29 LAB — CBC
MCH: 29.7 pg (ref 26.0–34.0)
Platelets: 211 10*3/uL (ref 150–400)
RBC: 4.41 MIL/uL (ref 4.22–5.81)
RDW: 13.3 % (ref 11.5–15.5)
WBC: 6.7 10*3/uL (ref 4.0–10.5)

## 2013-01-29 SURGERY — LEFT HEART CATHETERIZATION WITH CORONARY ANGIOGRAM
Anesthesia: LOCAL

## 2013-01-29 MED ORDER — FENTANYL CITRATE 0.05 MG/ML IJ SOLN
INTRAMUSCULAR | Status: AC
  Filled 2013-01-29: qty 2

## 2013-01-29 MED ORDER — MIDAZOLAM HCL 2 MG/2ML IJ SOLN
INTRAMUSCULAR | Status: AC
  Filled 2013-01-29: qty 2

## 2013-01-29 MED ORDER — ASPIRIN 81 MG PO TBEC: 81.0000 mg | DELAYED_RELEASE_TABLET | Freq: Every day | ORAL | Status: DC

## 2013-01-29 MED ORDER — HEPARIN (PORCINE) IN NACL 2-0.9 UNIT/ML-% IJ SOLN
INTRAMUSCULAR | Status: AC
  Filled 2013-01-29: qty 1000

## 2013-01-29 MED ORDER — SODIUM CHLORIDE 0.9 % IV SOLN
INTRAVENOUS | Status: DC
  Administered 2013-01-29: 14:00:00 via INTRAVENOUS

## 2013-01-29 MED ORDER — ACETAMINOPHEN 325 MG PO TABS: 650.0000 mg | ORAL_TABLET | ORAL | Status: DC | PRN

## 2013-01-29 MED ORDER — HEPARIN (PORCINE) IN NACL 100-0.45 UNIT/ML-% IJ SOLN: 1650.0000 [IU]/h | INTRAMUSCULAR | Status: DC

## 2013-01-29 MED ORDER — LIDOCAINE HCL (PF) 1 % IJ SOLN
INTRAMUSCULAR | Status: AC
  Filled 2013-01-29: qty 30

## 2013-01-29 MED ORDER — IBUPROFEN 200 MG PO TABS: 800.0000 mg | ORAL_TABLET | Freq: Two times a day (BID) | ORAL | Status: DC | PRN

## 2013-01-29 MED ORDER — ONDANSETRON HCL 4 MG/2ML IJ SOLN: 4.0000 mg | Freq: Four times a day (QID) | INTRAMUSCULAR | Status: DC | PRN

## 2013-01-29 NOTE — Care Management Note (Signed)
    Page 1 of 1   01/29/2013     12:32:46 PM   CARE MANAGEMENT NOTE 01/29/2013  Patient:  Taylor Schroeder, Taylor Schroeder   Account Number:  [id number]  Date Initiated:  01/29/2013  Documentation initiated by:  Junius Creamer  Subjective/Objective Assessment:   adm w ch pain     Action/Plan:   lives w wife, pcp dr Sanda Linger   Anticipated DC Date:     Anticipated DC Plan:  HOME/SELF CARE      DC Planning Services  CM consult      Choice offered to / List presented to:             Status of service:   Medicare Important Message given?   (If response is "NO", the following Medicare IM given date fields will be blank) Date Medicare IM given:   Date Additional Medicare IM given:    Discharge Disposition:    Per UR Regulation:  Reviewed for med. necessity/level of care/duration of stay  If discussed at Long Length of Stay Meetings, dates discussed:    Comments:

## 2013-01-29 NOTE — Progress Notes (Signed)
   SUBJECTIVE:  Patient admitted with chest pain.  Enzymes are negative so far.  Plan is for cath this am for evaluation of chest pain.   Mild chest soreness ongoing.    PHYSICAL EXAM Filed Vitals:   01/29/13 0400 01/29/13 0500 01/29/13 0600 01/29/13 0700  BP: 117/74     Pulse:      Temp:      TempSrc:      Resp: 10 16 13 13   Height:      Weight: 228 lb 6.3 oz (103.6 kg)     SpO2:       General:  No distress Lungs:  Clear Heart:  RRR Abdomen:  Positive bowel sounds, no rebound no guarding Extremities:  No edema  LABS: Lab Results  Component Value Date   TROPONINI <0.30 01/28/2013   Results for orders placed during the hospital encounter of 01/27/13 (from the past 24 hour(s))  TROPONIN I     Status: None   Collection Time    01/28/13  2:30 PM      Result Value Range   Troponin I <0.30  <0.30 ng/mL  CBC     Status: Abnormal   Collection Time    01/29/13  4:50 AM      Result Value Range   WBC 6.7  4.0 - 10.5 K/uL   RBC 4.41  4.22 - 5.81 MIL/uL   Hemoglobin 13.1  13.0 - 17.0 g/dL   HCT 04.5 (*) 40.9 - 81.1 %   MCV 86.4  78.0 - 100.0 fL   MCH 29.7  26.0 - 34.0 pg   MCHC 34.4  30.0 - 36.0 g/dL   RDW 91.4  78.2 - 95.6 %   Platelets 211  150 - 400 K/uL  HEPARIN LEVEL (UNFRACTIONATED)     Status: Abnormal   Collection Time    01/29/13  4:50 AM      Result Value Range   Heparin Unfractionated 0.26 (*) 0.30 - 0.70 IU/mL    Intake/Output Summary (Last 24 hours) at 01/29/13 0745 Last data filed at 01/29/13 0700  Gross per 24 hour  Intake    884 ml  Output    950 ml  Net    -66 ml    EKG:  Sinus rhythm, rate 60, axis within normal limits, intervals within normal limits, no acute ST-T wave changes.  01/29/2013  ASSESSMENT AND PLAN:  CHEST PAIN:   Has a hx of mild - moderate CAD/  Cath today.  Continue heparin and IV NTG.   Cholesterol is 155,  Trigs are 89.  HDL = 50.  LDL = 87.  Possible DC this afternoon if cath is unremarkable.         Elyn Aquas. 01/29/2013 7:45 AM

## 2013-01-29 NOTE — Progress Notes (Signed)
ANTICOAGULATION CONSULT NOTE - Initial Consult  Pharmacy Consult for Heparin Indication: chest pain/ACS  Allergies  Allergen Reactions  . Fexofenadine Other (See Comments)    Unknown-reaction  . Meperidine Hcl     "bad things" happen when I take it    Patient Measurements: Height: 5\' 9"  (175.3 cm) Weight: 228 lb 6.3 oz (103.6 kg) IBW/kg (Calculated) : 70.7 Heparin Dosing Weight: 95 kg   Vital Signs: Temp: 97.5 F (36.4 C) (05/05 0745) Temp src: Oral (05/05 0745) BP: 119/81 mmHg (05/05 0745) Pulse Rate: 75 (05/05 0745)  Labs:  Recent Labs  01/27/13 2133 01/27/13 2220 01/28/13 0117 01/28/13 0118 01/28/13 0425 01/28/13 0734 01/28/13 1430 01/29/13 0450  HGB 14.5 11.9*  --  13.2  --   --   --  13.1  HCT 40.2 35.0*  --  36.6*  --   --   --  38.1*  PLT 253  --   --  217  --   --   --  211  APTT  --   --   --  33  --   --   --   --   LABPROT  --   --   --  13.6  --   --   --   --   INR  --   --   --  1.05  --   --   --   --   HEPARINUNFRC  --   --   --   --   --  0.34  --  0.26*  CREATININE 1.03 0.90  --  0.94 0.97  --   --   --   TROPONINI  --   --  <0.30  --   --  <0.30 <0.30  --     Estimated Creatinine Clearance: 111.7 ml/min (by C-G formula based on Cr of 0.97).   Medical History: Past Medical History  Diagnosis Date  . Arthritis   . GERD (gastroesophageal reflux disease)   . Coronary artery disease     Medications:  Prescriptions prior to admission  Medication Sig Dispense Refill  . ibuprofen (ADVIL,MOTRIN) 200 MG tablet Take 800 mg by mouth 2 (two) times daily.      . Lorcaserin HCl (BELVIQ) 10 MG TABS Take 1 tablet by mouth 2 (two) times daily.  60 tablet  5  . omeprazole (PRILOSEC OTC) 20 MG tablet Take 20 mg by mouth every other day.        Assessment: 47 yo male with chest pain on heparin planning cath today. Heparin slightly below goal, will adjust. No bleeding issues noted.  Goal of Therapy:  Heparin level 0.3-0.7 units/ml Monitor  platelets by anticoagulation protocol: Yes   Plan:  Increase heparin to 1650 units/hr Follow up after cath  Sheppard Coil PharmD., BCPS Clinical Pharmacist Pager [phone] 01/29/2013 8:29 AM

## 2013-01-29 NOTE — Discharge Summary (Signed)
Patient ID: Taylor Schroeder,  MRN: [id number], DOB/AGE: December 19, 1965 47 y.o.  Admit date: 01/27/2013 Discharge date: 01/29/2013  Primary Care Provider: Sanda Linger Primary Cardiologist: previously T. Stuckey, MD  Discharge Diagnoses Principal Problem:   Chest pain  **s/p cardiac catheterization this admission revealing mild, nonobstructive CAD. Active Problems:   GERD (gastroesophageal reflux disease)   Morbid obesity  Allergies Allergies  Allergen Reactions  . Fexofenadine Other (See Comments)    Unknown-reaction  . Meperidine Hcl     "bad things" happen when I take it   Procedures  CT Angiography of the Chest with contrast  IMPRESSION:  1. Negative for acute dissection, intramural hematoma, pulmonary embolism or other acute cardiopulmonary abnormality. 2. Probable coronary artery disease as identified by the presence of calcified vascular plaque in the left anterior descending coronary artery 3. Trace biapical pulmonary emphysema 4. Cholelithiasis _____________  Cardiac Catheterization 01/29/2013  Hemodynamics:     LV pressure: 108/20 Aortic pressure: 114/72  Angiography    Left Main: smooth and normal Left anterior Descending: moderate in size, mild irregularities,  Mid vessel. 20% Left Circumflex: large dominant,  OM1 is normal.  PDA, small PLSA are normal Right Coronary Artery: small and non-dominant, normal LV Gram: normal LV function.  EF 60-65%  Complications: No apparent complications Patient did tolerate procedure well.  Contrast used: 45 cc  Conclusions:   Essentially normal coronaries.  He had several minor luminal irregularities.   Normal LV function. _____________  History of Present Illness  47 y/o male with a h/o non-obstructive CAD, who was in his usual state of health until the afternoon of 01/27/2013 when he developed a dull-aching sensation in his chest.  This persisted over approximately 5 hrs prior to worsening and becoming more stabbing  in character.  He presented to [facility] ED on the evening of 5/3, where ECG was non-acute and initial cardiac markers were negative.  CT angio of the chest with contrast was performed and was negative for PE or dissection.  He was admitted by cardiology for further evaluation.  Hospital Course  Pt ruled out for MI, though he continued to have dull chest aching.  He underwent diagnostic catheterization this AM, which showed mild, non-obstructive CAD.  He will be discharged home today in good condition.  Discharge Vitals Blood pressure 130/84, pulse 63, temperature 98 F (36.7 C), temperature source Oral, resp. rate 21, height 5\' 9"  (1.753 m), weight 228 lb 6.3 oz (103.6 kg), SpO2 95.00%.  Filed Weights   01/28/13 0100 01/28/13 0124 01/29/13 0400  Weight: 246 lb 14.6 oz (112 kg) 231 lb 0.7 oz (104.8 kg) 228 lb 6.3 oz (103.6 kg)   Labs  CBC  Recent Labs  01/28/13 0118 01/29/13 0450  WBC 9.1 6.7  NEUTROABS 6.3  --   HGB 13.2 13.1  HCT 36.6* 38.1*  MCV 84.1 86.4  PLT 217 211   Basic Metabolic Panel  Recent Labs  01/28/13 0118 01/28/13 0425  NA 137 139  K 4.0 3.8  CL 104 106  CO2 25 28  GLUCOSE 116* 103*  BUN 21 19  CREATININE 0.94 0.97  CALCIUM 9.1 9.2   Liver Function Tests  Recent Labs  01/28/13 0118  AST 14  ALT 18  ALKPHOS 59  BILITOT 0.3  PROT 6.5  ALBUMIN 3.6   Cardiac Enzymes  Recent Labs  01/28/13 0117 01/28/13 0734 01/28/13 1430  TROPONINI <0.30 <0.30 <0.30   Fasting Lipid Panel  Recent Labs  01/28/13 0425  CHOL 155  HDL 50  LDLCALC 87  TRIG 89  CHOLHDL 3.1   Disposition  Pt is being discharged home today in good condition.  Follow-up Plans & Appointments  Follow-up Information   Follow up with Sanda Linger, MD. (1-2 wks)    Contact information:   [address] [facility] [address] [place] [address] [phone]     Discharge Medications    Medication List    TAKE these medications       aspirin 81 MG EC tablet  Take 1  tablet (81 mg total) by mouth daily.     ibuprofen 200 MG tablet  Commonly known as:  ADVIL,MOTRIN  Take 4 tablets (800 mg total) by mouth 2 (two) times daily as needed for pain.     Lorcaserin HCl 10 MG Tabs  Commonly known as:  BELVIQ  Take 1 tablet by mouth 2 (two) times daily.     omeprazole 20 MG tablet  Commonly known as:  PRILOSEC OTC  Take 20 mg by mouth every other day.      Outstanding Labs/Studies  None  Duration of Discharge Encounter   Greater than 30 minutes including physician time.  Signed, Nicolasa Ducking NP 01/29/2013, 1:26 PM   Attending Note:   The patient was seen and examined.  Agree with assessment and plan as noted above.  Changes made to the above note as needed.  Cath was unremarkable.  DC to home.    [name], [name]., MD, [facility] 01/30/2013, 3:18 PM

## 2013-01-29 NOTE — CV Procedure (Signed)
    Cardiac Cath Note  Taylor Schroeder [id number] 16-Jul-1966  Procedure: Left  Heart Cardiac Catheterization Note Indications:  Chest pain  Procedure Details Consent: Obtained Time Out: Verified patient identification, verified procedure, site/side was marked, verified correct patient position, special equipment/implants available, Radiology Safety Procedures followed,  medications/allergies/relevent history reviewed, required imaging and test results available.  Performed   Medications: Fentanyl: 50 mcg IV Versed: 2 mg IV  We attempted to cannulate the right radial artery but were unable to gain access.  The right femoral artery was easily canulated using a modified Seldinger technique.  Hemodynamics:    LV pressure: 108/20 Aortic pressure: 114/72  Angiography   Left Main: smooth and normal  Left anterior Descending: moderate in size, mild irregularities,  Mid vessel. 20%  Left Circumflex: large dominant,  OM1 is normal.  PDA, small PLSA are normal  Right Coronary Artery: small and non-dominant, normal  LV Gram: normal LV function.  EF 60-65%  Complications: No apparent complications Patient did tolerate procedure well.  Contrast used: 45 cc  Conclusions:   Essentially normal coronaries.  He had several minor luminal irregularities.  Normal LV function.  [name], [name]., MD, [facility] 01/29/2013, 12:52 PM Office - [phone] Pager [phone]

## 2013-01-30 ENCOUNTER — Telehealth: Payer: Self-pay | Admitting: Cardiovascular Disease

## 2013-01-30 ENCOUNTER — Encounter: Payer: Self-pay | Admitting: *Deleted

## 2013-01-30 NOTE — Telephone Encounter (Signed)
Letter done, pt aware it is at front desk.

## 2013-01-30 NOTE — Telephone Encounter (Signed)
Per Bjorn Loser after hours voicemail, pt needs work note that he needs to be out of work the rest of this week, and to go back next week with no restrictions, needs today , pls call when ready

## 2013-02-05 ENCOUNTER — Telehealth: Payer: Self-pay | Admitting: Cardiology

## 2013-02-05 NOTE — Telephone Encounter (Signed)
Pt was called, pt forgot to pick up the letter that was placed at front desk last week. Pt told that I will get letter and take to MR to fax. Pt aware.

## 2013-02-05 NOTE — Telephone Encounter (Signed)
New problem    Per pt call please fax work excuse to [phone]

## 2013-02-21 ENCOUNTER — Encounter: Payer: Self-pay | Admitting: *Deleted

## 2013-02-21 ENCOUNTER — Encounter: Payer: 59 | Attending: Internal Medicine | Admitting: *Deleted

## 2013-02-21 VITALS — Ht 69.0 in | Wt 227.0 lb

## 2013-02-21 DIAGNOSIS — E669 Obesity, unspecified: Secondary | ICD-10-CM

## 2013-02-21 DIAGNOSIS — E663 Overweight: Secondary | ICD-10-CM | POA: Insufficient documentation

## 2013-02-21 DIAGNOSIS — Z713 Dietary counseling and surveillance: Secondary | ICD-10-CM | POA: Insufficient documentation

## 2013-02-21 NOTE — Patient Instructions (Addendum)
Taylor Schroeder Delights breakfast sandwiches (freezer aisle) Bring lunch from home: ham or [place] with cheese sandwich with fruit, water, applesauce  Snack: Nabs or nuts Dinner: protein and 2 veggies Drink plenty of water throughout the day  Get in the pool as much as possible

## 2013-02-21 NOTE — Progress Notes (Signed)
  Medical Nutrition Therapy:  Appt start time: 1030 end time:  1130.   Assessment:  Primary concerns today: Taylor Schroeder is here for nutrition counseling pertaining to obesity.  He has been following a 1500 calorie diet, and he admits that sometimes he doesn't even get that much to eat.  He complains of cravings at night.  He has cut back on his belviq, but is considering increasing his dose. He doesn't like the side effects and would eventually would like to come off of it.  He isn't able to eat much during the day because his lunch time is 30 minutes and there is nothing healthy close by so he feels like he can make good choices.  He feels like he might be able to bring lunch from home now that his family has moved out.  He used to eat huge portions, but now he eats less.  Most of this is sheer determination and he is willing himself to eat less.  He wants to lose weight to help his knees.  He is following a very strict diet.  His cholesterol is 155 mg/dl down from 161 in March.  He was recently in the hospital for his heart an had to post-pone much needed shoulder surgery.  He is in a lot of pain in his knees and shoulder.  His doctor recommends going out on disability   MEDICATIONS: see list   DIETARY INTAKE:  Usual eating pattern includes 1 meals and 2-3 snacks per day.  Everyday foods include lean protein, vegetables, fruits.  Avoided foods include red meat.  He doesn't drink as much anymore and he also doesn't keep as many sweets in the house  24-hr recall:  B ( AM): coffee with granola bar usually but hasn't had anything lately  Snk ( AM): maybe Nabs  L ( PM): bananas, apples (this is hard with bad tooth) Snk ( PM): none D ( PM): between 5-7pm: chicken or chicken with vegetables on flatbread Snk ( PM): doesn't eat anything even if hungry.  Might have banana.  Used to eat sweets a lot, but not much any more Beverages: water  Usual physical activity: walks and lifts boxes at work  Estimated  energy needs: 1800-2000 calories 200 g carbohydrates 135 g protein 50 g fat  Progress Towards Goal(s):  In progress.   Nutritional Diagnosis:  NB-1.1 Food and nutrition-related knowledge deficit As related to proper balance of fats, carbohydrates, and proteins.  As evidenced by previous morbid obesity and subsequent rapid weight loss from very strict diet plan.    Intervention:  Nutrition counseling provided.  Discussed metabolic effects of meal skipping and encouraged Taylor Schroeder to avoid this practice.  Discussed Healthy options for breakfast, lunch, dinner, and snacks.  Encouraged 1800 calories/day.  Encouraged adequate water and protein consumption   Monitoring/Evaluation:  Dietary intake, exercise, and body weight in 4 month(s).

## 2013-03-12 HISTORY — PX: SHOULDER ARTHROSCOPY: SHX128

## 2013-04-18 ENCOUNTER — Encounter: Payer: Self-pay | Admitting: Internal Medicine

## 2013-04-18 ENCOUNTER — Other Ambulatory Visit (INDEPENDENT_AMBULATORY_CARE_PROVIDER_SITE_OTHER): Payer: 59

## 2013-04-18 ENCOUNTER — Ambulatory Visit (INDEPENDENT_AMBULATORY_CARE_PROVIDER_SITE_OTHER): Payer: 59 | Admitting: Internal Medicine

## 2013-04-18 VITALS — BP 118/80 | HR 67 | Temp 97.7°F | Resp 16 | Wt 228.0 lb

## 2013-04-18 DIAGNOSIS — D649 Anemia, unspecified: Secondary | ICD-10-CM

## 2013-04-18 DIAGNOSIS — D510 Vitamin B12 deficiency anemia due to intrinsic factor deficiency: Secondary | ICD-10-CM

## 2013-04-18 DIAGNOSIS — N400 Enlarged prostate without lower urinary tract symptoms: Secondary | ICD-10-CM

## 2013-04-18 DIAGNOSIS — D518 Other vitamin B12 deficiency anemias: Secondary | ICD-10-CM

## 2013-04-18 DIAGNOSIS — R1310 Dysphagia, unspecified: Secondary | ICD-10-CM

## 2013-04-18 DIAGNOSIS — K219 Gastro-esophageal reflux disease without esophagitis: Secondary | ICD-10-CM

## 2013-04-18 DIAGNOSIS — D519 Vitamin B12 deficiency anemia, unspecified: Secondary | ICD-10-CM

## 2013-04-18 DIAGNOSIS — E669 Obesity, unspecified: Secondary | ICD-10-CM

## 2013-04-18 HISTORY — DX: Vitamin B12 deficiency anemia due to intrinsic factor deficiency: D51.0

## 2013-04-18 LAB — CBC WITH DIFFERENTIAL/PLATELET
Basophils Relative: 0.6 % (ref 0.0–3.0)
Eosinophils Relative: 1.8 % (ref 0.0–5.0)
Lymphocytes Relative: 37.2 % (ref 12.0–46.0)
Neutrophils Relative %: 50.8 % (ref 43.0–77.0)
RBC: 4.52 Mil/uL (ref 4.22–5.81)
WBC: 6.6 10*3/uL (ref 4.5–10.5)

## 2013-04-18 LAB — IBC PANEL
Saturation Ratios: 32.8 % (ref 20.0–50.0)
Transferrin: 248.2 mg/dL (ref 212.0–360.0)

## 2013-04-18 LAB — PSA: PSA: 1.74 ng/mL (ref 0.10–4.00)

## 2013-04-18 LAB — FOLATE: Folate: 12.5 ng/mL (ref >5.9)

## 2013-04-18 LAB — FERRITIN: Ferritin: 173.2 ng/mL (ref 22.0–322.0)

## 2013-04-18 MED ORDER — HYOSCYAMINE SULFATE ER 0.375 MG PO TB12: 0.3750 mg | ORAL_TABLET | Freq: Two times a day (BID) | ORAL | Status: DC | PRN

## 2013-04-18 MED ORDER — CYANOCOBALAMIN 500 MCG/0.1ML NA SOLN: 0.1000 mL | NASAL | Status: DC

## 2013-04-18 NOTE — Assessment & Plan Note (Signed)
Will continue the PPI and also add levbid for symptom relief

## 2013-04-18 NOTE — Assessment & Plan Note (Signed)
He is losing weight

## 2013-04-18 NOTE — Assessment & Plan Note (Signed)
I will check his CBC and vitamin levels today

## 2013-04-18 NOTE — Patient Instructions (Signed)
Gastroesophageal Reflux Disease, Adult  Gastroesophageal reflux disease (GERD) happens when acid from your stomach flows up into the esophagus. When acid comes in contact with the esophagus, the acid causes soreness (inflammation) in the esophagus. Over time, GERD may create small holes (ulcers) in the lining of the esophagus.  CAUSES   · Increased body weight. This puts pressure on the stomach, making acid rise from the stomach into the esophagus.  · Smoking. This increases acid production in the stomach.  · Drinking alcohol. This causes decreased pressure in the lower esophageal sphincter (valve or ring of muscle between the esophagus and stomach), allowing acid from the stomach into the esophagus.  · Late evening meals and a full stomach. This increases pressure and acid production in the stomach.  · A malformed lower esophageal sphincter.  Sometimes, no cause is found.  SYMPTOMS   · Burning pain in the lower part of the mid-chest behind the breastbone and in the mid-stomach area. This may occur twice a week or more often.  · Trouble swallowing.  · Sore throat.  · Dry cough.  · Asthma-like symptoms including chest tightness, shortness of breath, or wheezing.  DIAGNOSIS   Your caregiver may be able to diagnose GERD based on your symptoms. In some cases, X-rays and other tests may be done to check for complications or to check the condition of your stomach and esophagus.  TREATMENT   Your caregiver may recommend over-the-counter or prescription medicines to help decrease acid production. Ask your caregiver before starting or adding any new medicines.   HOME CARE INSTRUCTIONS   · Change the factors that you can control. Ask your caregiver for guidance concerning weight loss, quitting smoking, and alcohol consumption.  · Avoid foods and drinks that make your symptoms worse, such as:  · Caffeine or alcoholic drinks.  · Chocolate.  · Peppermint or mint flavorings.  · Garlic and onions.  · Spicy foods.  · Citrus fruits,  such as oranges, lemons, or limes.  · Tomato-based foods such as sauce, chili, salsa, and pizza.  · Fried and fatty foods.  · Avoid lying down for the 3 hours prior to your bedtime or prior to taking a nap.  · Eat small, frequent meals instead of large meals.  · Wear loose-fitting clothing. Do not wear anything tight around your waist that causes pressure on your stomach.  · Raise the head of your bed 6 to 8 inches with wood blocks to help you sleep. Extra pillows will not help.  · Only take over-the-counter or prescription medicines for pain, discomfort, or fever as directed by your caregiver.  · Do not take aspirin, ibuprofen, or other nonsteroidal anti-inflammatory drugs (NSAIDs).  SEEK IMMEDIATE MEDICAL CARE IF:   · You have pain in your arms, neck, jaw, teeth, or back.  · Your pain increases or changes in intensity or duration.  · You develop nausea, vomiting, or sweating (diaphoresis).  · You develop shortness of breath, or you faint.  · Your vomit is green, yellow, black, or looks like coffee grounds or blood.  · Your stool is red, bloody, or black.  These symptoms could be signs of other problems, such as heart disease, gastric bleeding, or esophageal bleeding.  MAKE SURE YOU:   · Understand these instructions.  · Will watch your condition.  · Will get help right away if you are not doing well or get worse.  Document Released: 06/23/2005 Document Revised: 12/06/2011 Document Reviewed: 04/02/2011  Exit[employer]® Patient   Information ©2014 [employer].

## 2013-04-18 NOTE — Assessment & Plan Note (Signed)
I am concerned that he may have UGI pathology so I have referred him to GI - ? The need for an EGD

## 2013-04-18 NOTE — Assessment & Plan Note (Signed)
PSA is normal.

## 2013-04-18 NOTE — Assessment & Plan Note (Signed)
Start B12 replacement therapy 

## 2013-04-18 NOTE — Progress Notes (Signed)
Subjective:    Patient ID: Taylor Schroeder, male    DOB: June 17, 1966, 47 y.o.   MRN: [id number]  Gastrophageal Reflux He reports no abdominal pain, no chest pain, no coughing, no nausea or no wheezing. The symptoms are aggravated by certain foods. Associated symptoms include weight loss. Pertinent negatives include no anemia, fatigue, melena, muscle weakness or orthopnea. Risk factors include NSAIDs. He has tried a PPI for the symptoms. The treatment provided mild relief. Past procedures include an EGD (he thinks there were some ulcers on his previous EGD).      Review of Systems  Constitutional: Positive for weight loss, appetite change and unexpected weight change. Negative for fever, chills, diaphoresis, activity change and fatigue.  HENT: Positive for trouble swallowing (and early satiety).   Eyes: Negative.   Respiratory: Negative.  Negative for cough, chest tightness, shortness of breath, wheezing and stridor.   Cardiovascular: Negative.  Negative for chest pain, palpitations and leg swelling.  Gastrointestinal: Positive for rectal pain. Negative for nausea, vomiting, abdominal pain, diarrhea, constipation, blood in stool, melena and abdominal distention.  Endocrine: Negative.   Genitourinary: Negative.   Musculoskeletal: Negative.  Negative for muscle weakness.  Skin: Negative.   Allergic/Immunologic: Negative.   Neurological: Negative.   Hematological: Negative.  Negative for adenopathy. Does not bruise/bleed easily.  Psychiatric/Behavioral: Negative.        Objective:   Physical Exam  Vitals reviewed. Constitutional: He is oriented to person, place, and time. He appears well-developed and well-nourished.  Non-toxic appearance. He does not have a sickly appearance. He does not appear ill. No distress.  HENT:  Head: Normocephalic and atraumatic.  Mouth/Throat: Oropharynx is clear and moist. No oropharyngeal exudate.  Eyes: Conjunctivae are normal. Right eye exhibits no  discharge. Left eye exhibits no discharge. No scleral icterus.  Neck: Normal range of motion. Neck supple. No JVD present. No tracheal deviation present. No thyromegaly present.  Cardiovascular: Normal rate, regular rhythm, normal heart sounds and intact distal pulses.  Exam reveals no gallop and no friction rub.   No murmur heard. Pulmonary/Chest: Effort normal and breath sounds normal. No stridor. No respiratory distress. He has no wheezes. He has no rales. He exhibits no tenderness.  Abdominal: Soft. Bowel sounds are normal. He exhibits no distension and no mass. There is no tenderness. There is no rebound and no guarding. Hernia confirmed negative in the right inguinal area and confirmed negative in the left inguinal area.  Genitourinary: Penis normal. Rectal exam shows external hemorrhoid and internal hemorrhoid. Rectal exam shows no fissure, no mass, no tenderness and anal tone normal. Guaiac negative stool. Prostate is enlarged (1+ smooth symm BPH). Prostate is not tender. Right testis shows no mass, no swelling and no tenderness. Right testis is descended. Left testis shows no mass, no swelling and no tenderness. Left testis is descended. Circumcised. No penile erythema or penile tenderness. No discharge found.  Musculoskeletal: Normal range of motion. He exhibits no edema and no tenderness.  Lymphadenopathy:    He has no cervical adenopathy.       Right: No inguinal adenopathy present.       Left: No inguinal adenopathy present.  Neurological: He is oriented to person, place, and time.  Skin: Skin is warm and dry. No rash noted. He is not diaphoretic. No erythema. No pallor.  Psychiatric: He has a normal mood and affect. His behavior is normal. Judgment and thought content normal.      Lab Results  Component Value Date  WBC 6.7 01/29/2013   HGB 13.1 01/29/2013   HCT 38.1* 01/29/2013   PLT 211 01/29/2013   GLUCOSE 103* 01/28/2013   CHOL 155 01/28/2013   TRIG 89 01/28/2013   HDL 50 01/28/2013    LDLDIRECT 147.6 08/29/2012   LDLCALC 87 01/28/2013   ALT 18 01/28/2013   AST 14 01/28/2013   NA 139 01/28/2013   K 3.8 01/28/2013   CL 106 01/28/2013   CREATININE 0.97 01/28/2013   BUN 19 01/28/2013   CO2 28 01/28/2013   TSH 2.49 08/29/2012   PSA 1.54 08/29/2012   INR 1.05 01/28/2013   HGBA1C 5.7 08/29/2012      Assessment & Plan:

## 2013-04-19 ENCOUNTER — Encounter: Payer: Self-pay | Admitting: Internal Medicine

## 2013-05-04 ENCOUNTER — Telehealth: Payer: Self-pay

## 2013-05-04 NOTE — Telephone Encounter (Signed)
Spoke to patient and he said maybe in the late 80's he had GI testing.  He's not real sure where or when.  I had called to see if we could get GI records before his visit with Dr. Leone Payor on 05/18/13.

## 2013-05-18 ENCOUNTER — Other Ambulatory Visit: Payer: 59

## 2013-05-18 ENCOUNTER — Encounter: Payer: Self-pay | Admitting: Internal Medicine

## 2013-05-18 ENCOUNTER — Ambulatory Visit (INDEPENDENT_AMBULATORY_CARE_PROVIDER_SITE_OTHER): Payer: 59 | Admitting: Internal Medicine

## 2013-05-18 VITALS — BP 114/80 | HR 80 | Ht 67.75 in | Wt 227.2 lb

## 2013-05-18 DIAGNOSIS — R1310 Dysphagia, unspecified: Secondary | ICD-10-CM

## 2013-05-18 DIAGNOSIS — E538 Deficiency of other specified B group vitamins: Secondary | ICD-10-CM

## 2013-05-18 DIAGNOSIS — R195 Other fecal abnormalities: Secondary | ICD-10-CM

## 2013-05-18 MED ORDER — NA SULFATE-K SULFATE-MG SULF 17.5-3.13-1.6 GM/177ML PO SOLN: ORAL | Status: DC

## 2013-05-18 MED ORDER — CYANOCOBALAMIN 1000 MCG/ML IJ SOLN
1000.0000 ug | Freq: Once | INTRAMUSCULAR | Status: AC
  Administered 2013-05-18: 1000 ug via INTRAMUSCULAR

## 2013-05-18 MED ORDER — VITAMIN B-12 1000 MCG PO TABS: 1000.0000 ug | ORAL_TABLET | Freq: Every day | ORAL | Status: DC

## 2013-05-18 NOTE — Patient Instructions (Addendum)
You have been scheduled for an endoscopy and colonoscopy with propofol. Please follow the written instructions given to you at your visit today. Please pick up your prep at the pharmacy within the next 1-3 days. If you use inhalers (even only as needed), please bring them with you on the day of your procedure. Your physician has requested that you go to www.startemmi.com and enter the access code given to you at your visit today. This web site gives a general overview about your procedure. However, you should still follow specific instructions given to you by our office regarding your preparation for the procedure.  Your physician has requested that you go to the basement for  lab work before leaving today.  Please purchase Vitamin B12 and take daily.  We are giving you a Vitamin B12 injection today.  I appreciate the opportunity to care for you.

## 2013-05-19 ENCOUNTER — Encounter: Payer: Self-pay | Admitting: Internal Medicine

## 2013-05-19 NOTE — Progress Notes (Signed)
Referred by Sanda Linger, MD Subjective:    Patient ID: Taylor Schroeder, male    DOB: 01/22/1966, 47 y.o.   MRN: [id number]  HPI The patient is a middle-aged married white man, with intermittent solid food dysphagia - mid-sternal sticking point for years. Getting worse. Some weight loss in last year. Rice and potatoes are particular problems.He has been taking Prilosec x years and no heartburn as long as he takes it at least every other day.  He also describes a 1.5 year hx of soft to loose stools that are hard to clean (anal area) after defecation. No change in medications or diet associated with this. No bleeding but does have some bulging with defecation, ? Hemorrhoids.  He was admitted to hospital in 01/2013 with chest pain - cardiac cath with non-obstructive disease  Allergies  Allergen Reactions  . Allegra [Fexofenadine Hcl]   . Demerol [Meperidine]   . Fexofenadine Other (See Comments)    Unknown-reaction  . Meperidine Hcl     "bad things" happen when I take it   Outpatient Prescriptions Prior to Visit  Medication Sig Dispense Refill  . aspirin EC 81 MG EC tablet Take 1 tablet (81 mg total) by mouth daily.      . hyoscyamine (LEVBID) 0.375 MG 12 hr tablet Take 1 tablet (0.375 mg total) by mouth every 12 (twelve) hours as needed for cramping.  180 tablet  3  . Lorcaserin HCl 10 MG TABS Take 1 tablet by mouth daily.      Marland Kitchen omeprazole (PRILOSEC OTC) 20 MG tablet Take 20 mg by mouth every other day.      . Cyanocobalamin 500 MCG/0.1ML SOLN Place 0.1 mLs (500 mcg total) into the nose once a week.  2.3 mL  11   No facility-administered medications prior to visit.   Past Medical History  Diagnosis Date  . Arthritis   . GERD (gastroesophageal reflux disease)   . Coronary artery disease Non-obstructive    a. nonobs cath in 2006;  b. 01/2013 Cath: LM nl, LAD 53m, LCX nl, RCA non-dom, nl. EF 60-65%.  . Obesity   . BPH (benign prostatic hyperplasia)   . Anemia    Past Surgical History   Procedure Laterality Date  . Appendectomy    . Cardiac catheterization    . Carpal tunnel release Right   . Shoulder arthroscopy Left 03/12/2013  . Inguinal hernia repair     History   Social History  . Marital Status: Married    Spouse Name: N/A    Number of Children: 2  . Years of Education: N/A   Occupational History  . warehouse    Social History Main Topics  . Smoking status: Former Smoker -- 2.00 packs/day for 30 years    Types: Cigarettes    Quit date: 08/29/2008  . Smokeless tobacco: Never Used  . Alcohol Use: 1.2 oz/week    2 Glasses of wine per week     Comment: socially  . Drug Use: No  . Sexual Activity: Yes   Other Topics Concern  . None   Social History Narrative   Married, 2 daughters   Company secretary   Family History  Problem Relation Age of Onset  . Hypertension Mother   . Cancer Father     type unknown  . Alcohol abuse Neg Hx   . Diabetes Neg Hx   . Early death Neg Hx   . Heart disease Neg Hx   . Hyperlipidemia Neg Hx   .  Kidney disease Neg Hx   . Stroke Neg Hx   . COPD Other   . Emphysema Maternal Grandmother   . Emphysema Maternal Grandfather   . Other Paternal Grandmother     spinal cancer        Review of Systems B12 deficiency recently dxed - taking nasal B12 which is irritating nasal passages some Arthritis All other ROS negative    Objective:   Physical Exam General:  Well-developed, well-nourished and in no acute distress Eyes:  anicteric. ENT:   Mouth and posterior pharynx free of lesions.  Neck:   supple w/o thyromegaly or mass.  Lungs: Clear to auscultation bilaterally. Heart:  S1S2, no rubs, murmurs, gallops. Abdomen:  soft, non-tender, no hepatosplenomegaly, hernia, or mass and BS+.  Rectal: deferred Lymph:  no cervical or supraclavicular adenopathy. Extremities:   no edema Skin   no rash. Neuro:  A&O x 3.  Psych:  appropriate mood and  Affect.   Data Reviewed: Lab Results  Component Value Date    VITAMINB12 163* 04/18/2013   Lab Results  Component Value Date   WBC 6.6 04/18/2013   HGB 13.9 04/18/2013   HCT 40.6 04/18/2013   MCV 89.9 04/18/2013   PLT 247.0 04/18/2013   Lab Results  Component Value Date   TSH 2.49 08/29/2012    PCP notes, 01/2013 hospital records CT angio Chest 01/2013     Assessment & Plan:  Dysphagia, unspecified(787.20) - Plan: Intrinsic Factor Antibodies, Anti-Parietal Antibody, Ambulatory referral to Gastroenterology, Tissue transglutaminase, IgA, IgA  Loose stools - Plan: Intrinsic Factor Antibodies, Anti-Parietal Antibody, Ambulatory referral to Gastroenterology, Tissue transglutaminase, IgA, IgA  B12 deficiency - Plan: vitamin B-12 (CYANOCOBALAMIN) 1000 MCG tablet, cyanocobalamin ((VITAMIN B-12)) injection 1,000 mcg  1. EGD/dilate esophagus and colonoscopy The risks and benefits as well as alternatives of endoscopic procedure(s) have been discussed and reviewed. All questions answered. The patient agrees to proceed. 2. B12 injection today - ? If he can get injectable at an outpt pharmacy - will discuss after endoscopies 3. Serologic evaluation to see why he has B12 deficiency (? Pernicious anemia, ? Celiac dz)  I appreciate the opportunity to care for this patient.  [id number] Yetta Barre, MD

## 2013-05-21 ENCOUNTER — Encounter: Payer: Self-pay | Admitting: Internal Medicine

## 2013-05-24 ENCOUNTER — Ambulatory Visit (AMBULATORY_SURGERY_CENTER): Payer: 59 | Admitting: Internal Medicine

## 2013-05-24 ENCOUNTER — Encounter: Payer: Self-pay | Admitting: Internal Medicine

## 2013-05-24 VITALS — BP 124/81 | HR 76 | Temp 96.7°F | Resp 17 | Ht 67.0 in | Wt 227.0 lb

## 2013-05-24 DIAGNOSIS — K5289 Other specified noninfective gastroenteritis and colitis: Secondary | ICD-10-CM

## 2013-05-24 DIAGNOSIS — D126 Benign neoplasm of colon, unspecified: Secondary | ICD-10-CM

## 2013-05-24 DIAGNOSIS — R1310 Dysphagia, unspecified: Secondary | ICD-10-CM

## 2013-05-24 DIAGNOSIS — R195 Other fecal abnormalities: Secondary | ICD-10-CM

## 2013-05-24 DIAGNOSIS — Q391 Atresia of esophagus with tracheo-esophageal fistula: Secondary | ICD-10-CM

## 2013-05-24 DIAGNOSIS — Q393 Congenital stenosis and stricture of esophagus: Secondary | ICD-10-CM

## 2013-05-24 LAB — HM COLONOSCOPY

## 2013-05-24 MED ORDER — SODIUM CHLORIDE 0.9 % IV SOLN: 500.0000 mL | INTRAVENOUS | Status: DC

## 2013-05-24 MED ORDER — CYANOCOBALAMIN 1000 MCG/ML IJ SOLN: 1000.0000 ug | INTRAMUSCULAR | Status: DC

## 2013-05-24 NOTE — Progress Notes (Addendum)
Patient did not have preoperative order for IV antibiotic SSI prophylaxis. (G8918)  Patient did not experience any of the following events: a burn prior to discharge; a fall within the facility; wrong site/side/patient/procedure/implant event; or a hospital transfer or hospital admission upon discharge from the facility. (G8907)  

## 2013-05-24 NOTE — Op Note (Signed)
[facility] [address] [place], [address]   COLONOSCOPY PROCEDURE REPORT  PATIENT: Taylor Schroeder, Taylor Schroeder  MR#: [id number] BIRTHDATE: 08/03/1966 , 47  yrs. old GENDER: Male ENDOSCOPIST: Iva Boop, MD, [facility] REFERRED [id number] Karsten Ro, M.D. PROCEDURE DATE:  05/24/2013 PROCEDURE:   Colonoscopy with biopsy First Screening Colonoscopy - Avg.  risk and is 50 yrs.  old or older - No.  Prior Negative Screening - Now for repeat screening. N/A  History of Adenoma - Now for follow-up colonoscopy & has been > or = to 3 yrs.  N/A ASA CLASS:   Class II INDICATIONS:unexplained diarrhea. MEDICATIONS: Propofol (Diprivan) 140 mg IV, MAC sedation, administered by CRNA, These medications were titrated to patient response per physician's verbal order, and There was residual sedation effect present from prior procedure.  DESCRIPTION OF PROCEDURE:   After the risks benefits and alternatives of the procedure were thoroughly explained, informed consent was obtained.  A digital rectal exam revealed an enlarged prostate.   The [id number]  endoscope was introduced through the anus and advanced to the terminal ileum which was intubated for a short distance. No adverse events experienced. The quality of the prep was excellent using Suprep  The instrument was then slowly withdrawn as the colon was fully examined.   COLON FINDINGS: Mild ileitis was found in the terminal ileum.  Two small erosions/ulcers. Multiple biopsies were performed using cold forceps.   The colonic mucosa appeared normal throughout the entire examined colon.  Multiple random biopsies of the area were performed.   A right colon retroflexion was performed.  Retroflexed views revealed no abnormalities. The time to cecum=2 minutes 32 seconds.  Withdrawal time=8 minutes 02 seconds.  The scope was withdrawn and the procedure completed. COMPLICATIONS: There were no complications.  ENDOSCOPIC IMPRESSION: 1.    Mild erosion and ileitis was found in the terminal ileum; multiple biopsies were performed using cold forceps 2.   The colonic mucosa appeared normal throughout the entire examined colon; multiple random biopsies of the area were performed.  RECOMMENDATIONS: 1.  Office will call with the results. 2.  Repeat routine  colonoscopy 10 years for screening   eSigned:  Iva Boop, MD, [facility] 05/24/2013 4:43 PM   cc: Etta Grandchild, MD and The Patient

## 2013-05-24 NOTE — Progress Notes (Signed)
Procedure ends, to recovery, report given and VSS. 

## 2013-05-24 NOTE — Patient Instructions (Addendum)
There was an abnormality called a ring in your esophagus. It is a ring of scar tissue where food was likely stopping. I dilated the esophagus to fix this. If it worked you should know soon.  There was subtle inflammation in the terminal ileum (end of the small intestine). I took biopsies of that and the normal colon to try to understand your loose stool problems.   I will let you know the results and plans, likely by next week (phone call).  I am providing a B12 prescription today ([facility]) to see if you can get the injectable form - you should be able to get tuberculin syringes and we can show you how to inject it (my staff). Let us know if you can get this. Be sure to stay on the oral B12 regardless, for now.   I appreciate the opportunity to care for you. Iva Boop, MD, FACG   YOU HAD AN ENDOSCOPIC PROCEDURE TODAY AT THE [facility]: Refer to the procedure report that was given to you for any specific questions about what was found during the examination.  If the procedure report does not answer your questions, please call your gastroenterologist to clarify.  If you requested that your care partner not be given the details of your procedure findings, then the procedure report has been included in a sealed envelope for you to review at your convenience later.  YOU SHOULD EXPECT: Some feelings of bloating in the abdomen. Passage of more gas than usual.  Walking can help get rid of the air that was put into your GI tract during the procedure and reduce the bloating. If you had a lower endoscopy (such as a colonoscopy or flexible sigmoidoscopy) you may notice spotting of blood in your stool or on the toilet paper. If you underwent a bowel prep for your procedure, then you may not have a normal bowel movement for a few days.  DIET: You may have clear liquids at 5:30PM.  If they are tolerated, you may go to the soft diet for the rest of the day.  Drink plenty of fluids but you  should avoid alcoholic beverages for 24 hours.  ACTIVITY: Your care partner should take you home directly after the procedure.  You should plan to take it easy, moving slowly for the rest of the day.  You can resume normal activity the day after the procedure however you should NOT DRIVE or use heavy machinery for 24 hours (because of the sedation medicines used during the test).    SYMPTOMS TO REPORT IMMEDIATELY: A gastroenterologist can be reached at any hour.  During normal business hours, 8:30 AM to 5:00 PM Monday through Friday, call [phone].  After hours and on weekends, please call the GI answering service at [phone] who will take a message and have the physician on call contact you.   Following lower endoscopy (colonoscopy or flexible sigmoidoscopy):  Excessive amounts of blood in the stool  Significant tenderness or worsening of abdominal pains  Swelling of the abdomen that is new, acute  Fever of 100F or higher  Following upper endoscopy (EGD)  Vomiting of blood or coffee ground material  New chest pain or pain under the shoulder blades  Painful or persistently difficult swallowing  New shortness of breath  Fever of 100F or higher  Black, tarry-looking stools  FOLLOW UP: If any biopsies were taken you will be contacted by phone or by letter within the next 1-3  weeks.  Call your gastroenterologist if you have not heard about the biopsies in 3 weeks.  Our staff will call the home number listed on your records the next business day following your procedure to check on you and address any questions or concerns that you may have at that time regarding the information given to you following your procedure. This is a courtesy call and so if there is no answer at the home number and we have not heard from you through the emergency physician on call, we will assume that you have returned to your regular daily activities without incident.  SIGNATURES/CONFIDENTIALITY: You  and/or your care partner have signed paperwork which will be entered into your electronic medical record.  These signatures attest to the fact that that the information above on your After Visit Summary has been reviewed and is understood.  Full responsibility of the confidentiality of this discharge information lies with you and/or your care-partner.  See above for B12 instructions.

## 2013-05-24 NOTE — Progress Notes (Signed)
Called to room to assist during endoscopic procedure.  Patient ID and intended procedure confirmed with present staff. Received instructions for my participation in the procedure from the performing physician.  

## 2013-05-24 NOTE — Op Note (Signed)
[facility] [address] [place], [address]   ENDOSCOPY PROCEDURE REPORT  PATIENT: Taylor Schroeder, Taylor Schroeder  MR#: [id number] BIRTHDATE: [birth year] , 47  yrs. old GENDER: Male ENDOSCOPIST: Iva Boop, MD, [facility] REFERRED BY:  Etta Grandchild, M.D. PROCEDURE DATE:  05/24/2013 PROCEDURE:  EGD, diagnostic and Maloney dilation of esophagus ASA CLASS:     Class II INDICATIONS:  Dysphagia. MEDICATIONS: Propofol (Diprivan) 180 mg IV, MAC sedation, administered by CRNA, and These medications were titrated to patient response per physician's verbal order TOPICAL ANESTHETIC: none  DESCRIPTION OF PROCEDURE: After the risks benefits and alternatives of the procedure were thoroughly explained, informed consent was obtained.  The LB [id number] endoscope was introduced through the mouth and advanced to the second portion of the duodenum. Without limitations.  The instrument was slowly withdrawn as the mucosa was fully examined.        ESOPHAGUS: A moderately severe  ring was found in the lower third of the esophagus.  The remainder of the upper endoscopy exam was otherwise normal. Retroflexed views revealed no abnormalities.     The scope was then withdrawn from the patient. a 54 Fr Maloney dilator was passed w/o difficulty or heme  and the procedure completed.  COMPLICATIONS: There were no complications. ENDOSCOPIC IMPRESSION: 1.   Ring was found in the lower third of the esophagus - dilated 54 Fr 2.   The remainder of the upper endoscopy exam was otherwise normal  RECOMMENDATIONS: Clear liquids until  , then soft foods rest iof day.  Resume prior diet tomorrow. Start subcutaneous monthly B12 if possible (Rx given) - nasal irritated nares and he has pernicious anemia so though may absorb oral B12 ok SQ/IM injection more reliable See colonoscopy also   eSigned:  Iva Boop, MD, [facility] 05/24/2013 4:38 PM   QM:VHQION Karsten Ro, MD and The Patient

## 2013-05-25 ENCOUNTER — Telehealth: Payer: Self-pay

## 2013-05-25 NOTE — Telephone Encounter (Signed)
  Follow up Call-  Call back number 05/24/2013  Post procedure Call Back phone  # [phone]  Permission to leave phone message Yes     Patient questions:  Do you have a fever, pain , or abdominal swelling? no Pain Score  0 *  Have you tolerated food without any problems? yes  Have you been able to return to your normal activities? yes  Do you have any questions about your discharge instructions: Diet   no Medications  no Follow up visit  no  Do you have questions or concerns about your Care? no  Actions: * If pain score is 4 or above: No action needed, pain <4.

## 2013-06-01 ENCOUNTER — Encounter: Payer: Self-pay | Admitting: Internal Medicine

## 2013-06-01 ENCOUNTER — Other Ambulatory Visit: Payer: Self-pay

## 2013-06-01 DIAGNOSIS — K5090 Crohn's disease, unspecified, without complications: Secondary | ICD-10-CM

## 2013-06-01 DIAGNOSIS — K5000 Crohn's disease of small intestine without complications: Secondary | ICD-10-CM | POA: Insufficient documentation

## 2013-06-01 MED ORDER — BUDESONIDE 3 MG PO CP24: 9.0000 mg | ORAL_CAPSULE | ORAL | Status: DC

## 2013-06-01 NOTE — Progress Notes (Signed)
Quick Note:  Biopsies from ileum suggest Crohn's disease - please let him know  1) Entocort EC 9 mg daily #90 1 refill 2) IBD expanded panel please 3) REV 6 weeks 4) Send him info about Crohn's - it is a working diagnosis at this point - lab tests and response or not to Entocort will help [name] tell 5) he can also go to [email or web address] for info 6) if he has any specific ?'s I can call him or message via My Chart if active - let me know   LEC 10 year colon recall no letter ______

## 2013-06-06 ENCOUNTER — Telehealth: Payer: Self-pay | Admitting: Internal Medicine

## 2013-06-06 ENCOUNTER — Other Ambulatory Visit (INDEPENDENT_AMBULATORY_CARE_PROVIDER_SITE_OTHER): Payer: 59

## 2013-06-06 DIAGNOSIS — K5090 Crohn's disease, unspecified, without complications: Secondary | ICD-10-CM

## 2013-06-06 DIAGNOSIS — R195 Other fecal abnormalities: Secondary | ICD-10-CM

## 2013-06-06 DIAGNOSIS — R1310 Dysphagia, unspecified: Secondary | ICD-10-CM

## 2013-06-07 LAB — IBD EXPANDED PANEL
ACCA: 39 units (ref 0–90)
Atypical pANCA: NEGATIVE
gASCA: 14 units (ref 0–50)

## 2013-06-07 LAB — TISSUE TRANSGLUTAMINASE, IGA: Tissue Transglutaminase Ab, IgA: 4.6 U/mL (ref <20)

## 2013-06-07 NOTE — Telephone Encounter (Signed)
Left message on machine to call back  

## 2013-06-07 NOTE — Telephone Encounter (Signed)
Would you like to prescribe something else?

## 2013-06-07 NOTE — Telephone Encounter (Signed)
Prednisone 10 mg tabs #100 no refills 40 mg daily x 5 days 30 mg daily x 5 days 20 mg daily x 10 days 10mg  daily until he sees me

## 2013-06-08 NOTE — Telephone Encounter (Signed)
Let him know though biopsies suggested Crohn's the lab testing does not - so inconclusive as to the cause of loose stools and intestinal changes  Its ok to hold off on Tx and discuss at f/u in Oct- call back sooner if ?'s/worse

## 2013-06-08 NOTE — Progress Notes (Signed)
Quick Note:  Patient to be called ______

## 2013-06-08 NOTE — Telephone Encounter (Signed)
Pt does not want to take prednisone, he says there is something going on with his insurance and its not paying for anything right now.  He will keep f/u appt and call back if he hears from his insurance and gets that straightened out.  I did advise the pt it is important to take his medications.

## 2013-06-14 ENCOUNTER — Telehealth: Payer: Self-pay | Admitting: *Deleted

## 2013-06-14 NOTE — Telephone Encounter (Signed)
Pt informed. He states he is trying to appeal the Belviq denial. He wants to hold off on Adipex at this time. He will let us know if/when he decides to use Adipex.

## 2013-06-14 NOTE — Telephone Encounter (Signed)
He can try adipex but for only 4 months He can't take adipex for 2 years like he could with belviq This is his decision

## 2013-06-14 NOTE — Telephone Encounter (Signed)
Belziq PA is denied. Letter has been mailed. Options are to change med or appeal denial.

## 2013-06-20 ENCOUNTER — Ambulatory Visit: Payer: 59 | Admitting: Internal Medicine

## 2013-06-25 ENCOUNTER — Ambulatory Visit: Payer: 59 | Admitting: *Deleted

## 2013-06-27 ENCOUNTER — Encounter: Payer: Self-pay | Admitting: Internal Medicine

## 2013-06-27 ENCOUNTER — Ambulatory Visit (INDEPENDENT_AMBULATORY_CARE_PROVIDER_SITE_OTHER): Payer: 59 | Admitting: Internal Medicine

## 2013-06-27 VITALS — BP 124/88 | HR 83 | Temp 97.2°F | Resp 16 | Ht 67.0 in | Wt 230.0 lb

## 2013-06-27 DIAGNOSIS — D510 Vitamin B12 deficiency anemia due to intrinsic factor deficiency: Secondary | ICD-10-CM

## 2013-06-27 DIAGNOSIS — E669 Obesity, unspecified: Secondary | ICD-10-CM

## 2013-06-27 DIAGNOSIS — Z23 Encounter for immunization: Secondary | ICD-10-CM

## 2013-06-27 MED ORDER — LORCASERIN HCL 10 MG PO TABS: 1.0000 | ORAL_TABLET | Freq: Every day | ORAL | Status: DC

## 2013-06-27 MED ORDER — CYANOCOBALAMIN 2000 MCG PO TABS: 2000.0000 ug | ORAL_TABLET | Freq: Every day | ORAL | Status: DC

## 2013-06-27 NOTE — Progress Notes (Signed)
  Subjective:    Patient ID: Taylor Schroeder, male    DOB: 06-08-1966, 47 y.o.   MRN: [id number]  Anemia Presents for follow-up visit. There has been no abdominal pain, anorexia, bruising/bleeding easily, confusion, fever, leg swelling, light-headedness, malaise/fatigue, pallor, palpitations, paresthesias, pica or weight loss. Signs of blood loss that are not present include hematemesis, hematochezia and melena. Past treatments include parenteral vitamin B12 and oral vitamin B12.      Review of Systems  Constitutional: Negative.  Negative for fever, chills, weight loss, malaise/fatigue, diaphoresis, appetite change and fatigue.  HENT: Negative.   Eyes: Negative.   Respiratory: Negative.  Negative for cough, chest tightness, shortness of breath, wheezing and stridor.   Cardiovascular: Negative.  Negative for chest pain, palpitations and leg swelling.  Gastrointestinal: Negative.  Negative for nausea, vomiting, abdominal pain, diarrhea, constipation, melena, hematochezia, anorexia and hematemesis.  Endocrine: Negative.   Genitourinary: Negative.   Musculoskeletal: Negative.   Skin: Negative.  Negative for pallor.  Allergic/Immunologic: Negative.   Neurological: Negative.  Negative for light-headedness and paresthesias.  Hematological: Negative.  Does not bruise/bleed easily.  Psychiatric/Behavioral: Negative.  Negative for confusion.       Objective:   Physical Exam   Lab Results  Component Value Date   WBC 6.6 04/18/2013   HGB 13.9 04/18/2013   HCT 40.6 04/18/2013   PLT 247.0 04/18/2013   GLUCOSE 103* 01/28/2013   CHOL 155 01/28/2013   TRIG 89 01/28/2013   HDL 50 01/28/2013   LDLDIRECT 147.6 08/29/2012   LDLCALC 87 01/28/2013   ALT 18 01/28/2013   AST 14 01/28/2013   NA 139 01/28/2013   K 3.8 01/28/2013   CL 106 01/28/2013   CREATININE 0.97 01/28/2013   BUN 19 01/28/2013   CO2 28 01/28/2013   TSH 2.49 08/29/2012   PSA 1.74 04/18/2013   INR 1.05 01/28/2013   HGBA1C 5.7 08/29/2012       Assessment  & Plan:

## 2013-06-27 NOTE — Patient Instructions (Signed)

## 2013-06-28 NOTE — Assessment & Plan Note (Signed)
He has been unable to afford the B12 injection so he will try high dose oral B12 replacement therapy

## 2013-06-28 NOTE — Assessment & Plan Note (Signed)
The request for belviq was denied because I stated that he had not lost 5% BW since he started but on further review today his weight decreased from 247 lb ---> 227 lb after several months of belviq so we will re-submit an Rx and ask for an appeal.

## 2013-07-09 ENCOUNTER — Encounter: Payer: 59 | Attending: Internal Medicine | Admitting: *Deleted

## 2013-07-09 DIAGNOSIS — Z713 Dietary counseling and surveillance: Secondary | ICD-10-CM | POA: Insufficient documentation

## 2013-07-09 DIAGNOSIS — K219 Gastro-esophageal reflux disease without esophagitis: Secondary | ICD-10-CM

## 2013-07-09 DIAGNOSIS — K5000 Crohn's disease of small intestine without complications: Secondary | ICD-10-CM

## 2013-07-09 DIAGNOSIS — E669 Obesity, unspecified: Secondary | ICD-10-CM

## 2013-07-09 NOTE — Progress Notes (Signed)
  Medical Nutrition Therapy:  Appt start time: 1700 end time:  1730.  Assessment:  Primary concerns today: Taylor Schroeder is here for nutrition counseling follow up for obesity.  He had shoulder surgery in June and was out of work 6 weeks.  He has been diagnosed with moderate Chron's disease.  He has been off Belviq and his sciatic nerve is painful so he isn't able to be as active.  His daughter and her family have moved back in so the foods that are in the home are not as healthy. He's going to need a double knee replacement and his activity is limited.  He is very upset over his weight gain  MEDICATIONS: see list   DIETARY INTAKE:  Usual eating pattern includes 2 meals and 0 snacks per day.  Everyday foods include protein and carbohydrates.  Avoided foods include ham and hamburgers (causes GI distress).  Very seldom eats red meat or potatoes, corn  24-hr recall:  B ( AM): poptart or pastry Snk ( AM): none  L ( PM): sub sandwich or Nabs or skips Snk ( PM): none D ( PM): meat and 2 sides Snk ( PM): not usually Beverages: diet soda and black coffee.  Sometimes sweet tea  Usual physical activity: not now  Estimated energy needs: 1800 calories 200 g carbohydrates 135 g protein 50 g fat    Nutritional Diagnosis:  [place]-3.4 Unintentional weight gain As related to limited physical activity and inability to afford nutritious foods.  As evidenced by 7 pound weight gain this year.    Intervention:  Nutrition counseling provided.  Gave fliers on low-cost farmers' markets.  Discussed healthier breakfast options like [place] sandwich and bringing lunch like bagel, apple, and string cheese.  Reminded him to drink more water  Monitoring/Evaluation:  Dietary intake, exercise, GI symptoms, and body weight prn.  Suggested Pervis Hocking for Chron's disease education

## 2013-07-12 ENCOUNTER — Ambulatory Visit (INDEPENDENT_AMBULATORY_CARE_PROVIDER_SITE_OTHER): Payer: 59 | Admitting: Internal Medicine

## 2013-07-12 ENCOUNTER — Encounter: Payer: Self-pay | Admitting: Internal Medicine

## 2013-07-12 VITALS — BP 134/90 | HR 78 | Ht 67.0 in | Wt 235.0 lb

## 2013-07-12 DIAGNOSIS — K222 Esophageal obstruction: Secondary | ICD-10-CM | POA: Insufficient documentation

## 2013-07-12 DIAGNOSIS — K5000 Crohn's disease of small intestine without complications: Secondary | ICD-10-CM

## 2013-07-12 DIAGNOSIS — D510 Vitamin B12 deficiency anemia due to intrinsic factor deficiency: Secondary | ICD-10-CM

## 2013-07-12 HISTORY — DX: Esophageal obstruction: K22.2

## 2013-07-12 MED ORDER — CYANOCOBALAMIN 1000 MCG/ML IJ SOLN
1000.0000 ug | Freq: Once | INTRAMUSCULAR | Status: AC
  Administered 2013-07-12: 1000 ug via INTRAMUSCULAR

## 2013-07-12 NOTE — Assessment & Plan Note (Addendum)
B12 1000 ug IM today and continue oral - could be ok Recheck level later in year me or Dr. Yetta Barre

## 2013-07-12 NOTE — Assessment & Plan Note (Signed)
He seems to have mild to moderate disease. I don't think further studies such as a capsule endoscopy would change much. Ideally a 1-2 months treatment with Entocort could prove diagnostic. He does use ibuprofen 800 mg daily which could cause terminal ileal ulcers and we'll have to keep that in mind but it seems like he could certainly have Crohn's disease of the small bowel. Given cost issues I will not press for a Pneumovax her vitamin D level at this time he is having difficulty affording all of his medical needs at this time. Options include mesalamine compound started to the small bowel versus something like a sick all, sulfasalazine as a possibility though more least favorite choice given propensity for side effects. Not convinced and immunomodulators needed for him at this point. I will get back to him with results of the we can find out about cost assistance etc.

## 2013-07-12 NOTE — Assessment & Plan Note (Signed)
Dysphagia markedly improved though he had one episode of recurrence. He is to chew carefully and I think he should take 40 mg a PPI every day so to Prilosec OTC or generic equivalent make sense. In looking at his formulary as best I can tell none of the generic or name Oliver Barre PPIs in prescription strength are covered.

## 2013-07-12 NOTE — Patient Instructions (Addendum)
Today you have been given a Vitamin B-12 injection.  We will investigate options for treatment of chron's and call you with the most cost effective ideas.  Please take 2 omeprazole before breakfast daily. (40mg  daily)  I appreciate the opportunity to care for you.

## 2013-07-12 NOTE — Progress Notes (Signed)
  Subjective:    Patient ID: Taylor Schroeder, male    DOB: Jul 06, 1966, 47 y.o.   MRN: [id number]  HPI The patient returns for followup. He has been diagnosed with Crohn's ileitis I. recent colonoscopy in August. He has read about it, and thinks that over the years he's had intermittent gas cramps and pains and loose stools associated with certain foods like dairy products and fatty foods and thinks that might be explained by Crohn's disease and I agree. He could not afford Entocort there was a >$400 copay.   He had a lower esophageal but not GE junction ring in the esophagus and this was dilated with a 54 [place] Maloney dilator, he is noted a marked improvement in dysphagia but did have 1 episode of solid food dysphagia french fries recently. He is taking 20 mg omeprazole and alternates that with 40 mg pantoprazole with his wife gets. He is a very restrictive medication formulary it seems as far as high co-pay or even requiring full payment.  He is trying oral B12 because B12 injections to the pharmacy for one vial of 10 injections was $68 which was cost prohibitive for him.  Medications, allergies, past medical history, past surgical history, family history and social history are reviewed and updated in the EMR.  Review of Systems He has right-sided sciatica problems, he has bilateral chronic knee pain, still has some shoulder difficulties Weight is increasing, his obesity drug was denied further approval to Dr. Yetta Barre is working on that    Objective:   Physical Exam The patient is a middle-aged white man, obese, no acute distress    Assessment & Plan:

## 2013-07-16 ENCOUNTER — Telehealth: Payer: Self-pay | Admitting: Internal Medicine

## 2013-07-16 NOTE — Telephone Encounter (Signed)
Per last note, Belviq PA denied and phentermine was offered. Pt declined phentermine stating he would appeal insurance decision.

## 2013-07-16 NOTE — Telephone Encounter (Signed)
Patient needs PA for Belviq, wants to make sure we received information about this and what the status of his PA is, requesting call back

## 2013-07-26 ENCOUNTER — Telehealth: Payer: Self-pay | Admitting: Internal Medicine

## 2013-07-26 NOTE — Telephone Encounter (Signed)
07/26/2013  Pt left message inquiring about a pre-auth for Belviq.  Pt wants to know the status.  Please contact pt.

## 2013-07-27 ENCOUNTER — Telehealth: Payer: Self-pay | Admitting: Internal Medicine

## 2013-07-27 NOTE — Telephone Encounter (Deleted)
07/27/2013  Pt called in again about the Belviq.  Pt states that when he was in office on 06/27/2013, he informed Dr. Yetta Barre that the [facility] company and [facility] have been trying to contact this office regarding the pre-auth for this RX.  I informed Pt to get in touch with insurance and [facility] to make sure they have the correct fax number, and to have insurance company contact us directly regarding the matter if needed.  No further action needed at this time, just a [place]

## 2013-07-27 NOTE — Telephone Encounter (Signed)
Merrilyn Puma, CMA at 06/14/2013 4:56 PM    Status: Signed             Pt informed. He states he is trying to appeal the Belviq denial. He wants to hold off on Adipex at this time. He will let us know if/when he decides to use Adipex.         Etta Grandchild, MD at 06/14/2013 2:10 PM    Status: Signed             He can try adipex but for only 4 months  He can't take adipex for 2 years like he could with belviq  This is his decision        Merrilyn Puma, CMA at 06/14/2013 2:06 PM    Status: Signed             Belziq PA is denied. Letter has been mailed. Options are to change

## 2013-07-27 NOTE — Telephone Encounter (Signed)
07/27/2013  Pt called in again about the Belviq.  Pt states that when he was in office on 06/27/2013, he informed Dr. Jones that the insurance company and [facility] have been trying to contact this office regarding the pre-auth for this RX.  I informed Pt to get in touch with insurance and [facility] to make sure they have the correct fax number, and to have insurance company contact us directly regarding the matter if needed.  No further action needed at this time, just a FYI °

## 2013-07-27 NOTE — Telephone Encounter (Signed)
A user error has taken place.

## 2013-07-30 ENCOUNTER — Telehealth: Payer: Self-pay | Admitting: *Deleted

## 2013-07-30 NOTE — Telephone Encounter (Signed)
Status: Signed              Merrilyn Puma, CMA at 06/14/2013 4:56 PM      Status:  Signed                        Pt informed. He states he is trying to appeal the Belviq denial. He wants to hold off on Adipex at this time. He will let us know if/when he decides to use Adipex.               Etta Grandchild, MD at 06/14/2013 2:10 PM      Status:  Signed                        He can try adipex but for only 4 months   He can't take adipex for 2 years like he could with belviq   This is his decision              Merrilyn Puma, CMA at 06/14/2013 2:06 PM      Status:  Signed                     Belziq PA is denied. Letter has been mailed. Options are to change

## 2013-07-30 NOTE — Telephone Encounter (Signed)
Pt called states Belviq Rx needs PA.  Please advise

## 2013-08-03 ENCOUNTER — Telehealth: Payer: Self-pay

## 2013-08-03 NOTE — Telephone Encounter (Signed)
Patient called stating that he is aware of the initial denial of Belviq but he has since lost additional weight and requalify. Patient request that PA is resubmitted with new information. [facility] called [facility] but office closed at this time, will try again later.

## 2013-08-03 NOTE — Telephone Encounter (Signed)
Letter written

## 2013-08-03 NOTE — Telephone Encounter (Signed)
[facility] to [facility] request and was advised that MD will need to provide a letter of medical necessity that gives new information that meets requirements. This letter must be faxed to Prescription claim Appeals at [phone]. Thanks

## 2013-08-03 NOTE — Telephone Encounter (Signed)
Letter of appeal faxed to [phone] Prescription claim appeals # [id number]. Pt notified of status

## 2013-08-03 NOTE — Telephone Encounter (Signed)
Must indicate if  Pt has lost at least 5% of baseline body weight.

## 2013-10-01 ENCOUNTER — Telehealth: Payer: Self-pay | Admitting: Internal Medicine

## 2013-10-01 ENCOUNTER — Ambulatory Visit: Payer: Self-pay | Admitting: Nurse Practitioner

## 2013-10-01 NOTE — Telephone Encounter (Signed)
Patient Information:  Caller Name: Jariah  Phone: [phone]  Patient: Tramon, Crescenzo  Gender: Male  DOB: 06-Jan-1966  Age: 48 Years  PCP: Scarlette Calico (Adults only)  Office Follow Up:  Does the office need to follow up with this patient?: No  Instructions For The Office: N/A  RN Note:  All appts full 1/5 for [facility] office, scheduled at [facility] office for 1315 10/01/13.  Symptoms  Reason For Call & Symptoms: Burning feeling to left side of neck under ear down to collar bone area;  Feels like and "[place] Burn";  No rash, redness or injury;  Pain increased 09/30/13.  Not sleeping well due to the discomfort.  Pain is pretty constant today, Ibuprofen dulls the pain some for awhile.  No chest pain or sob.  Reviewed Health History In EMR: Yes  Reviewed Medications In EMR: Yes  Reviewed Allergies In EMR: Yes  Reviewed Surgeries / Procedures: Yes  Date of Onset of Symptoms: 09/17/2013  Treatments Tried: Used his Oxycodone 1/2 of a pill  09/29/13 that he had left from a surgery, helped him sleep but still woke up with alot of pain. Ibuprofen with some help  Treatments Tried Worked: No  Guideline(s) Used:  Neck Pain or Stiffness  Disposition Per Guideline:   Go to Office Now  Reason For Disposition Reached:   Head is twisting to one side (or ask "is it turning against your will?")  Advice Given:  Call Back If:  You become worse.  Patient Will Follow Care Advice:  YES

## 2013-10-03 ENCOUNTER — Encounter: Payer: Self-pay | Admitting: Internal Medicine

## 2013-10-03 ENCOUNTER — Ambulatory Visit (INDEPENDENT_AMBULATORY_CARE_PROVIDER_SITE_OTHER): Payer: 59 | Admitting: Internal Medicine

## 2013-10-03 ENCOUNTER — Ambulatory Visit (INDEPENDENT_AMBULATORY_CARE_PROVIDER_SITE_OTHER)
Admission: RE | Admit: 2013-10-03 | Discharge: 2013-10-03 | Disposition: A | Discharge status: Home / Self Care | Payer: 59 | Source: Ambulatory Visit | Attending: Internal Medicine | Admitting: Internal Medicine

## 2013-10-03 VITALS — BP 132/88 | HR 84 | Temp 97.8°F | Resp 16 | Ht 67.0 in | Wt 228.8 lb

## 2013-10-03 DIAGNOSIS — M5030 Other cervical disc degeneration, unspecified cervical region: Secondary | ICD-10-CM

## 2013-10-03 DIAGNOSIS — J019 Acute sinusitis, unspecified: Secondary | ICD-10-CM | POA: Insufficient documentation

## 2013-10-03 DIAGNOSIS — M542 Cervicalgia: Secondary | ICD-10-CM

## 2013-10-03 MED ORDER — CYCLOBENZAPRINE HCL 10 MG PO TABS: 10.0000 mg | ORAL_TABLET | Freq: Three times a day (TID) | ORAL | Status: DC | PRN

## 2013-10-03 MED ORDER — AMOXICILLIN 875 MG PO TABS: 875.0000 mg | ORAL_TABLET | Freq: Two times a day (BID) | ORAL | Status: DC

## 2013-10-03 NOTE — Patient Instructions (Signed)
Torticollis, Acute °You have suddenly (acutely) developed a twisted neck (torticollis). This is usually a self-limited condition. °CAUSES  °Acute torticollis may be caused by malposition, trauma or infection. Most commonly, acute torticollis is caused by sleeping in an awkward position. Torticollis may also be caused by the flexion, extension or twisting of the neck muscles beyond their normal position. Sometimes, the exact cause may not be known. °SYMPTOMS  °Usually, there is pain and limited movement of the neck. Your neck may twist to one side. °DIAGNOSIS  °The diagnosis is often made by physical examination. X-rays, CT scans or MRIs may be done if there is a history of trauma or concern of infection. °TREATMENT  °For a common, stiff neck that develops during sleep, treatment is focused on relaxing the contracted neck muscle. Medications (including shots) may be used to treat the problem. Most cases resolve in several days. Torticollis usually responds to conservative physical therapy. If left untreated, the shortened and spastic neck muscle can cause deformities in the face and neck. Rarely, surgery is required. °HOME CARE INSTRUCTIONS  °· Use over-the-counter and prescription medications as directed by your caregiver. °· Do stretching exercises and massage the neck as directed by your caregiver. °· Follow up with physical therapy if needed and as directed by your caregiver. °SEEK IMMEDIATE MEDICAL CARE IF:  °· You develop difficulty breathing or noisy breathing (stridor). °· You drool, develop trouble swallowing or have pain with swallowing. °· You develop numbness or weakness in the hands or feet. °· You have changes in speech or vision. °· You have problems with urination or bowel movements. °· You have difficulty walking. °· You have a fever. °· You have increased pain. °MAKE SURE YOU:  °· Understand these instructions. °· Will watch your condition. °· Will get help right away if you are not doing well or  get worse. °Document Released: 09/10/2000 Document Revised: 12/06/2011 Document Reviewed: 10/22/2009 °[employer]® Patient Information ©2014 [employer]. ° °

## 2013-10-03 NOTE — Progress Notes (Signed)
Pre visit review using our clinic review tool, if applicable. No additional management support is needed unless otherwise documented below in the visit note. 

## 2013-10-03 NOTE — Assessment & Plan Note (Signed)
I will treat the infection with amoxil 

## 2013-10-03 NOTE — Assessment & Plan Note (Signed)
He tried oxycodone and did not get much relief so I have asked him to cont taking nsaids and to try a muscle relaxer

## 2013-10-03 NOTE — Progress Notes (Signed)
Subjective:    Patient ID: Taylor Schroeder, male    DOB: Oct 14, 1965, 48 y.o.   MRN: [id number]  Sinusitis This is a recurrent problem. The current episode started 1 to 4 weeks ago. The problem is unchanged. There has been no fever. The fever has been present for less than 1 day. His pain is at a severity of 0/10. He is experiencing no pain. Associated symptoms include congestion, neck pain and sinus pressure. Pertinent negatives include no chills, coughing, diaphoresis, ear pain, headaches, hoarse voice, shortness of breath, sneezing, sore throat or swollen glands. Past treatments include nothing.  Neck Pain  This is a recurrent problem. The current episode started 1 to 4 weeks ago. The problem occurs intermittently. The problem has been unchanged. The pain is associated with nothing. The pain is present in the left side. The quality of the pain is described as aching. The pain is at a severity of 2/10. The pain is mild. Nothing aggravates the symptoms. Pertinent negatives include no chest pain, fever, headaches, leg pain, numbness, pain with swallowing, paresis, photophobia, syncope, tingling, trouble swallowing, visual change, weakness or weight loss. He has tried NSAIDs and oral narcotics for the symptoms. The treatment provided mild relief.      Review of Systems  Constitutional: Negative.  Negative for fever, chills, weight loss, diaphoresis, activity change, appetite change, fatigue and unexpected weight change.  HENT: Positive for congestion and sinus pressure. Negative for ear pain, hoarse voice, sneezing, sore throat and trouble swallowing.   Eyes: Negative.  Negative for photophobia.  Respiratory: Negative.  Negative for apnea, cough, choking, chest tightness, shortness of breath, wheezing and stridor.   Cardiovascular: Negative.  Negative for chest pain, palpitations, leg swelling and syncope.  Gastrointestinal: Negative.  Negative for abdominal pain, diarrhea, constipation, blood in  stool and rectal pain.  Endocrine: Negative.   Genitourinary: Negative.   Musculoskeletal: Positive for neck pain. Negative for arthralgias, back pain, gait problem, joint swelling, myalgias and neck stiffness.  Skin: Negative.   Allergic/Immunologic: Negative.   Neurological: Negative.  Negative for dizziness, tingling, tremors, seizures, syncope, facial asymmetry, speech difficulty, weakness, light-headedness, numbness and headaches.  Hematological: Negative.  Negative for adenopathy. Does not bruise/bleed easily.  Psychiatric/Behavioral: Negative.        Objective:   Physical Exam  Vitals reviewed. Constitutional: He is oriented to person, place, and time. He appears well-developed and well-nourished. No distress.  HENT:  Head: Normocephalic and atraumatic.  Right Ear: Hearing, tympanic membrane, external ear and ear canal normal.  Left Ear: Hearing, tympanic membrane, external ear and ear canal normal.  Nose: Mucosal edema and rhinorrhea present. Right sinus exhibits maxillary sinus tenderness. Right sinus exhibits no frontal sinus tenderness. Left sinus exhibits maxillary sinus tenderness. Left sinus exhibits no frontal sinus tenderness.  Mouth/Throat: Oropharynx is clear and moist and mucous membranes are normal. Mucous membranes are not pale, not dry and not cyanotic. No oral lesions. No trismus in the jaw. No uvula swelling. No oropharyngeal exudate, posterior oropharyngeal edema, posterior oropharyngeal erythema or tonsillar abscesses.  Eyes: Conjunctivae are normal. Right eye exhibits no discharge. Left eye exhibits no discharge. No scleral icterus.  Neck: Normal range of motion. Neck supple. No JVD present. No tracheal deviation present. No thyromegaly present.  Cardiovascular: Normal rate, regular rhythm, normal heart sounds and intact distal pulses.  Exam reveals no gallop and no friction rub.   No murmur heard. Pulmonary/Chest: Effort normal and breath sounds normal. No  stridor. No respiratory distress.  He has no wheezes. He has no rales. He exhibits no tenderness.  Abdominal: Soft. Bowel sounds are normal. He exhibits no distension and no mass. There is no tenderness. There is no rebound and no guarding.  Musculoskeletal: Normal range of motion. He exhibits no edema and no tenderness.       Cervical back: Normal. He exhibits normal range of motion, no tenderness, no bony tenderness, no swelling, no edema, no deformity, no laceration, no pain, no spasm and normal pulse.  Lymphadenopathy:    He has no cervical adenopathy.  Neurological: He is alert and oriented to person, place, and time. He has normal strength. He displays no atrophy, no tremor and normal reflexes. No cranial nerve deficit or sensory deficit. He exhibits normal muscle tone. He displays a negative Romberg sign. He displays no seizure activity. Coordination and gait normal. He displays no Babinski's sign on the right side. He displays no Babinski's sign on the left side.  Reflex Scores:      Tricep reflexes are 0 on the right side and 0 on the left side.      Bicep reflexes are 0 on the right side and 0 on the left side.      Brachioradialis reflexes are 0 on the right side and 0 on the left side.      Patellar reflexes are 0 on the right side and 0 on the left side.      Achilles reflexes are 0 on the right side and 0 on the left side. Skin: Skin is warm and dry. No rash noted. He is not diaphoretic. No erythema. No pallor.  Psychiatric: He has a normal mood and affect. His behavior is normal. Judgment and thought content normal.      Lab Results  Component Value Date   WBC 6.6 04/18/2013   HGB 13.9 04/18/2013   HCT 40.6 04/18/2013   PLT 247.0 04/18/2013   GLUCOSE 103* 01/28/2013   CHOL 155 01/28/2013   TRIG 89 01/28/2013   HDL 50 01/28/2013   LDLDIRECT 147.6 08/29/2012   LDLCALC 87 01/28/2013   ALT 18 01/28/2013   AST 14 01/28/2013   NA 139 01/28/2013   K 3.8 01/28/2013   CL 106 01/28/2013   CREATININE  0.97 01/28/2013   BUN 19 01/28/2013   CO2 28 01/28/2013   TSH 2.49 08/29/2012   PSA 1.74 04/18/2013   INR 1.05 01/28/2013   HGBA1C 5.7 08/29/2012      Assessment & Plan:

## 2013-10-03 NOTE — Assessment & Plan Note (Signed)
The xray shows that he has a lot of disease so I have asked him to see pain management about this

## 2013-10-04 ENCOUNTER — Ambulatory Visit: Payer: 59 | Admitting: Internal Medicine

## 2013-10-23 ENCOUNTER — Encounter: Payer: Self-pay | Admitting: Physical Medicine & Rehabilitation

## 2013-10-24 ENCOUNTER — Ambulatory Visit: Payer: 59 | Admitting: Internal Medicine

## 2013-11-19 ENCOUNTER — Ambulatory Visit: Payer: 59 | Admitting: Physical Medicine & Rehabilitation

## 2014-01-29 ENCOUNTER — Other Ambulatory Visit (HOSPITAL_COMMUNITY): Payer: Self-pay | Admitting: Orthopaedic Surgery

## 2014-01-29 NOTE — Pre-Procedure Instructions (Addendum)
PEARCE LITTLEFIELD  01/29/2014   Your procedure is scheduled on:  02/06/14  Report to [facility] short stay admitting at 1100 AM.  Call this number if you have problems the morning of surgery: [phone]   Remember:   Do not eat food or drink liquids after midnight.   Take these medicines the morning of surgery with A SIP OF WATER:omeprazole           STOP all herbel meds, nsaids (aleve,naproxen,advil,ibuprofen) 5 days prior to surgery including vitamins, aspirin    Do not wear jewelry, make-up or nail polish.  Do not wear lotions, powders, or perfumes. You may wear deodorant.  Do not shave 48 hours prior to surgery. Men may shave face and neck.  Do not bring valuables to the hospital.  [facility] is not responsible                  for any belongings or valuables.               Contacts, dentures or bridgework may not be worn into surgery.  Leave suitcase in the car. After surgery it may be brought to your room.  For patients admitted to the hospital, discharge time is determined by your                treatment team.               Patients discharged the day of surgery will not be allowed to drive  home.  Name and phone number of your driver:   Special Instructions:  Special Instructions: [facility] - Preparing for Surgery  Before surgery, you can play an important role.  Because skin is not sterile, your skin needs to be as free of germs as possible.  You can reduce the number of germs on you skin by washing with CHG (chlorahexidine gluconate) soap before surgery.  CHG is an antiseptic cleaner which kills germs and bonds with the skin to continue killing germs even after washing.  Please DO NOT use if you have an allergy to CHG or antibacterial soaps.  If your skin becomes reddened/irritated stop using the CHG and inform your nurse when you arrive at Short Stay.  Do not shave (including legs and underarms) for at least 48 hours prior to the first CHG shower.  You may shave your  face.  Please follow these instructions carefully:   1.  Shower with CHG Soap the night before surgery and the morning of Surgery.  2.  If you choose to wash your hair, wash your hair first as usual with your normal shampoo.  3.  After you shampoo, rinse your hair and body thoroughly to remove the Shampoo.  4.  Use CHG as you would any other liquid soap.  You can apply chg directly  to the skin and wash gently with scrungie or a clean washcloth.  5.  Apply the CHG Soap to your body ONLY FROM THE NECK DOWN.  Do not use on open wounds or open sores.  Avoid contact with your eyes ears, mouth and genitals (private parts).  Wash genitals (private parts)       with your normal soap.  6.  Wash thoroughly, paying special attention to the area where your surgery will be performed.  7.  Thoroughly rinse your body with warm water from the neck down.  8.  DO NOT shower/wash with your normal soap after using and rinsing off the CHG  Soap.  9.  Pat yourself dry with a clean towel.            10.  Wear clean pajamas.            11.  Place clean sheets on your bed the night of your first shower and do not sleep with pets.  Day of Surgery  Do not apply any lotions/deodorants the morning of surgery.  Please wear clean clothes to the hospital/surgery center.   Please read over the following fact sheets that you were given: Pain Booklet, Coughing and Deep Breathing, Blood Transfusion Information, Total Joint Packet, MRSA Information and Surgical Site Infection Prevention

## 2014-01-30 ENCOUNTER — Encounter (HOSPITAL_COMMUNITY)
Admission: RE | Admit: 2014-01-30 | Discharge: 2014-01-30 | Disposition: A | Discharge status: Home / Self Care | Payer: 59 | Source: Ambulatory Visit | Attending: Orthopaedic Surgery | Admitting: Orthopaedic Surgery

## 2014-01-30 ENCOUNTER — Ambulatory Visit (HOSPITAL_COMMUNITY)
Admission: RE | Admit: 2014-01-30 | Discharge: 2014-01-30 | Disposition: A | Discharge status: Home / Self Care | Payer: 59 | Source: Ambulatory Visit | Attending: Orthopaedic Surgery | Admitting: Orthopaedic Surgery

## 2014-01-30 ENCOUNTER — Encounter (HOSPITAL_COMMUNITY): Payer: Self-pay

## 2014-01-30 DIAGNOSIS — K219 Gastro-esophageal reflux disease without esophagitis: Secondary | ICD-10-CM | POA: Insufficient documentation

## 2014-01-30 DIAGNOSIS — M1710 Unilateral primary osteoarthritis, unspecified knee: Secondary | ICD-10-CM | POA: Insufficient documentation

## 2014-01-30 DIAGNOSIS — Z01818 Encounter for other preprocedural examination: Secondary | ICD-10-CM | POA: Insufficient documentation

## 2014-01-30 DIAGNOSIS — Z87891 Personal history of nicotine dependence: Secondary | ICD-10-CM | POA: Insufficient documentation

## 2014-01-30 DIAGNOSIS — IMO0002 Reserved for concepts with insufficient information to code with codable children: Secondary | ICD-10-CM

## 2014-01-30 DIAGNOSIS — Z01810 Encounter for preprocedural cardiovascular examination: Secondary | ICD-10-CM | POA: Insufficient documentation

## 2014-01-30 DIAGNOSIS — Z01812 Encounter for preprocedural laboratory examination: Secondary | ICD-10-CM | POA: Insufficient documentation

## 2014-01-30 LAB — CBC
HCT: 42.3 % (ref 39.0–52.0)
Hemoglobin: 14.7 g/dL (ref 13.0–17.0)
MCH: 30.4 pg (ref 26.0–34.0)
MCHC: 34.8 g/dL (ref 30.0–36.0)
MCV: 87.6 fL (ref 78.0–100.0)
PLATELETS: 253 10*3/uL (ref 150–400)
RBC: 4.83 MIL/uL (ref 4.22–5.81)
RDW: 13.1 % (ref 11.5–15.5)
WBC: 8.3 10*3/uL (ref 4.0–10.5)

## 2014-01-30 LAB — COMPREHENSIVE METABOLIC PANEL
ALK PHOS: 64 U/L (ref 39–117)
ALT: 27 U/L (ref 0–53)
AST: 20 U/L (ref 0–37)
Albumin: 4 g/dL (ref 3.5–5.2)
BILIRUBIN TOTAL: 0.4 mg/dL (ref 0.3–1.2)
BUN: 20 mg/dL (ref 6–23)
CHLORIDE: 102 meq/L (ref 96–112)
CO2: 25 meq/L (ref 19–32)
Calcium: 9.6 mg/dL (ref 8.4–10.5)
Creatinine, Ser: 0.97 mg/dL (ref 0.50–1.35)
GFR calc Af Amer: >90 mL/min (ref >90)
GFR calc non Af Amer: >90 mL/min (ref >90)
Glucose, Bld: 98 mg/dL (ref 70–99)
Potassium: 4.5 mEq/L (ref 3.7–5.3)
SODIUM: 140 meq/L (ref 137–147)
Total Protein: 7.5 g/dL (ref 6.0–8.3)

## 2014-01-30 LAB — TYPE AND SCREEN
ABO/RH(D): A POS
Antibody Screen: NEGATIVE

## 2014-01-30 LAB — URINALYSIS, ROUTINE W REFLEX MICROSCOPIC
Bilirubin Urine: NEGATIVE
GLUCOSE, UA: NEGATIVE mg/dL
HGB URINE DIPSTICK: NEGATIVE
Ketones, ur: NEGATIVE mg/dL
LEUKOCYTES UA: NEGATIVE
Nitrite: NEGATIVE
PH: 5.5 (ref 5.0–8.0)
Protein, ur: NEGATIVE mg/dL
Specific Gravity, Urine: 1.026 (ref 1.005–1.030)
Urobilinogen, UA: 0.2 mg/dL (ref 0.0–1.0)

## 2014-01-30 LAB — PROTIME-INR
INR: 0.94 (ref 0.00–1.49)
Prothrombin Time: 12.4 seconds (ref 11.6–15.2)

## 2014-01-30 LAB — SURGICAL PCR SCREEN
MRSA, PCR: NEGATIVE
Staphylococcus aureus: POSITIVE — AB

## 2014-01-30 LAB — ABO/RH: ABO/RH(D): A POS

## 2014-01-30 LAB — APTT: APTT: 27 s (ref 24–37)

## 2014-02-01 NOTE — Progress Notes (Signed)
Anesthesia Chart Review:  Patient is a 48 year old male scheduled for left TKA on 02/06/14 by Dr. Lorin Mercy.  History includes former smoker, GERD, pernicious anemia, Crohn's ileitis, BPH, lower esophageal ring s/p dilation, arthritis. BMI is 35 consistent with obesity. GI is Dr. Carlean Purl.  PCP is Dr. Scarlette Calico.  He was seen by cardiologist Dr. Mertie Moores for chest pain and had a cardiac cath on 01/29/13 that showed essentially normal coronaries with only minor luminal irregularities (20% mid LAD) and normal LVF with EF 60-65%.  EKG on 01/30/14 showed NSR, cannot rule out anterior infarct (age undetermined).  It was not felt significantly changed from last tracing.  Preoperative CXR and labs noted.    Patient without significant CAD by cath last year.  Preoperative studies appear acceptable for OR.  Anticipate that he can proceed as planned.  George Hugh [facility] Short Stay Center/Anesthesiology Phone [phone] 02/01/2014 10:32 AM

## 2014-02-04 ENCOUNTER — Encounter: Payer: Self-pay | Admitting: Physician Assistant

## 2014-02-04 ENCOUNTER — Ambulatory Visit (INDEPENDENT_AMBULATORY_CARE_PROVIDER_SITE_OTHER): Payer: 59 | Admitting: Physician Assistant

## 2014-02-04 VITALS — BP 132/100 | HR 91 | Temp 98.5°F | Resp 16 | Wt 242.0 lb

## 2014-02-04 DIAGNOSIS — H6120 Impacted cerumen, unspecified ear: Secondary | ICD-10-CM

## 2014-02-04 NOTE — Progress Notes (Signed)
Pre visit review using our clinic review tool, if applicable. No additional management support is needed unless otherwise documented below in the visit note. 

## 2014-02-04 NOTE — Patient Instructions (Signed)
We have scheduled you an appoint with ENT. They will be able to help you with your ears being clogged from here.  Follow up to clinic as needed.  Cerumen Impaction A cerumen impaction is when the wax in your ear forms a plug. This plug usually causes reduced hearing. Sometimes it also causes an earache or dizziness. Removing a cerumen impaction can be difficult and painful. The wax sticks to the ear canal. The canal is sensitive and bleeds easily. If you try to remove a heavy wax buildup with a cotton tipped swab, you may push it in further. Irrigation with water, suction, and small ear curettes may be used to clear out the wax. If the impaction is fixed to the skin in the ear canal, ear drops may be needed for a few days to loosen the wax. People who build up a lot of wax frequently can use ear wax removal products available in your local drugstore. SEEK MEDICAL CARE IF:  You develop an earache, increased hearing loss, or marked dizziness. Document Released: 10/21/2004 Document Revised: 12/06/2011 Document Reviewed: 12/11/2009 [facility] Patient Information 2014 [place], [place].

## 2014-02-04 NOTE — Progress Notes (Signed)
Subjective:    Patient ID: Taylor Schroeder, male    DOB: 04-27-1966, 48 y.o.   MRN: [id number]  HPI Patient is a 48 year old Caucasian male presenting to the clinic today for blocked ears. Patient has had Cerumen impaction in the past requiring ear lavage. Patient has a knee replacement surgery scheduled for this Wednesday, and would like for his ears to be cleaned prior to this. Patient said that over the past week he did notice that his ears were itching a little more frequently. Patient has noticed some decrease in his hearing, however he can still hear people speaking enough to communicate. He had tried to clean his ears with Q-tips, however this offered no relief. Patient did admit to some anxiety currently over thinking about his knee replacement surgery scheduled for Wednesday. He denies any other symptoms at this time. He denies fevers, chills, nausea, vomiting, diarrhea, dizziness and shortness of breath.   Review of Systems As per the history of present illness and are otherwise negative.  Past Medical History  Diagnosis Date  . Arthritis   . GERD (gastroesophageal reflux disease)   . Coronary artery disease Non-obstructive    a. nonobs cath in 2006;  b. 01/2013 Cath: LM nl, LAD 57m, LCX nl, RCA non-dom, nl. EF 60-65%.  . Obesity   . BPH (benign prostatic hyperplasia)   . Anemia   . Pernicious anemia-B12 deficiency 04/18/2013  . Crohn's ileitis - working dx 06/01/2013  . Esophageal ring, acquired 07/12/2013   Past Surgical History  Procedure Laterality Date  . Appendectomy    . Cardiac catheterization    . Carpal tunnel release Right   . Shoulder arthroscopy Left 03/12/2013  . Inguinal hernia repair    . Colonoscopy    . Esophagogastroduodenoscopy      reports that he quit smoking about 5 years ago. His smoking use included Cigarettes. He has a 60 pack-year smoking history. He has never used smokeless tobacco. He reports that he drinks about 1.2 ounces of alcohol per week. He  reports that he does not use illicit drugs. family history includes COPD in his other; Cancer in his father; Emphysema in his maternal grandfather and maternal grandmother; Hypertension in his mother; Other in his paternal grandmother. There is no history of Alcohol abuse, Diabetes, Early death, Heart disease, Hyperlipidemia, Kidney disease, or Stroke. Allergies  Allergen Reactions  . Allegra [Fexofenadine Hcl] Hives  . Demerol [Meperidine] Nausea And Vomiting  . Fexofenadine Other (See Comments)    Unknown-reaction  . Meperidine Hcl     "bad things" happen when I take it       Objective:   Physical Exam  Nursing note and vitals reviewed. Constitutional: He is oriented to person, place, and time. He appears well-developed and well-nourished. No distress.  HENT:  Head: Normocephalic and atraumatic.  Nose: Nose normal.  Mouth/Throat: Oropharynx is clear and moist. No oropharyngeal exudate.  Bilateral tympanic membranes are not visualized due to serum and impaction in the bilateral external canals.    Eyes: Conjunctivae and EOM are normal. Pupils are equal, round, and reactive to light.  Neck: Normal range of motion. Neck supple. No JVD present.  Cardiovascular: Normal rate, regular rhythm, normal heart sounds and intact distal pulses.  Exam reveals no gallop and no friction rub.   No murmur heard. Pulmonary/Chest: Effort normal and breath sounds normal. No stridor. No respiratory distress. He has no wheezes. He has no rales. He exhibits no tenderness.  Musculoskeletal: Normal range  of motion.  Lymphadenopathy:    He has no cervical adenopathy.  Neurological: He is alert and oriented to person, place, and time.  Hearing is grossly intact.  Skin: Skin is warm and dry. No rash noted. He is not diaphoretic. No erythema. No pallor.  Psychiatric: He has a normal mood and affect. His behavior is normal. Judgment and thought content normal.  Patient slightly anxious during visit.   Filed  Vitals:   02/04/14 1120  BP: 132/100  Pulse: 91  Temp: 98.5 F (36.9 C)  Resp: 16    Lab Results  Component Value Date   WBC 8.3 01/30/2014   HGB 14.7 01/30/2014   HCT 42.3 01/30/2014   PLT 253 01/30/2014   GLUCOSE 98 01/30/2014   CHOL 155 01/28/2013   TRIG 89 01/28/2013   HDL 50 01/28/2013   LDLDIRECT 147.6 08/29/2012   LDLCALC 87 01/28/2013   ALT 27 01/30/2014   AST 20 01/30/2014   NA 140 01/30/2014   K 4.5 01/30/2014   CL 102 01/30/2014   CREATININE 0.97 01/30/2014   BUN 20 01/30/2014   CO2 25 01/30/2014   TSH 2.49 08/29/2012   PSA 1.74 04/18/2013   INR 0.94 01/30/2014   HGBA1C 5.7 08/29/2012      Assessment & Plan:  Taylor Schroeder was seen today for ears clogged.  Diagnoses and associated orders for this visit:  Cerumen impaction - Ear Lavage: incomplete lavage of the ear canals. After extensive lavage, bilateral ear canals still had impacted Cerumen deep in the ear canal. - ENT was called during visit, and appointment set for tomorrow at 1:45PM to have ears evaluated.  Plan to followup to clinic as needed.  Patient Instructions  We have scheduled you an appoint with ENT. They will be able to help you with your ears being clogged from here.  Follow up to clinic as needed.

## 2014-02-05 MED ORDER — CHLORHEXIDINE GLUCONATE 4 % EX LIQD
60.0000 mL | Freq: Once | CUTANEOUS | Status: DC
  Filled 2014-02-05: qty 60

## 2014-02-05 MED ORDER — CEFAZOLIN SODIUM-DEXTROSE 2-3 GM-% IV SOLR
2.0000 g | INTRAVENOUS | Status: AC
  Administered 2014-02-06: 2 g via INTRAVENOUS
  Filled 2014-02-05: qty 50

## 2014-02-06 ENCOUNTER — Encounter (HOSPITAL_COMMUNITY)
Admission: RE | Disposition: A | Discharge status: Home Health | Payer: Self-pay | Source: Ambulatory Visit | Attending: Orthopaedic Surgery

## 2014-02-06 ENCOUNTER — Encounter (HOSPITAL_COMMUNITY): Payer: Self-pay | Admitting: Certified Registered Nurse Anesthetist

## 2014-02-06 ENCOUNTER — Inpatient Hospital Stay (HOSPITAL_COMMUNITY): Payer: 59 | Admitting: Anesthesiology

## 2014-02-06 ENCOUNTER — Encounter (HOSPITAL_COMMUNITY): Payer: 59 | Admitting: Vascular Surgery

## 2014-02-06 ENCOUNTER — Inpatient Hospital Stay (HOSPITAL_COMMUNITY)
Admission: RE | Admit: 2014-02-06 | Discharge: 2014-02-09 | DRG: 470 | Disposition: A | Discharge status: Home Health | Payer: 59 | Source: Ambulatory Visit | Attending: Orthopaedic Surgery | Admitting: Orthopaedic Surgery

## 2014-02-06 DIAGNOSIS — M1710 Unilateral primary osteoarthritis, unspecified knee: Principal | ICD-10-CM | POA: Diagnosis present

## 2014-02-06 DIAGNOSIS — Z87891 Personal history of nicotine dependence: Secondary | ICD-10-CM

## 2014-02-06 DIAGNOSIS — I2510 Atherosclerotic heart disease of native coronary artery without angina pectoris: Secondary | ICD-10-CM | POA: Diagnosis present

## 2014-02-06 DIAGNOSIS — N400 Enlarged prostate without lower urinary tract symptoms: Secondary | ICD-10-CM | POA: Diagnosis present

## 2014-02-06 DIAGNOSIS — Z79899 Other long term (current) drug therapy: Secondary | ICD-10-CM

## 2014-02-06 DIAGNOSIS — M1712 Unilateral primary osteoarthritis, left knee: Secondary | ICD-10-CM | POA: Diagnosis present

## 2014-02-06 DIAGNOSIS — K219 Gastro-esophageal reflux disease without esophagitis: Secondary | ICD-10-CM | POA: Diagnosis present

## 2014-02-06 DIAGNOSIS — E669 Obesity, unspecified: Secondary | ICD-10-CM | POA: Diagnosis present

## 2014-02-06 DIAGNOSIS — Z6841 Body Mass Index (BMI) 40.0 and over, adult: Secondary | ICD-10-CM

## 2014-02-06 HISTORY — PX: TOTAL KNEE ARTHROPLASTY: SHX125

## 2014-02-06 HISTORY — PX: KNEE ARTHROPLASTY: SHX992

## 2014-02-06 HISTORY — DX: Personal history of other diseases of the digestive system: Z87.19

## 2014-02-06 SURGERY — ARTHROPLASTY, KNEE, TOTAL, USING IMAGELESS COMPUTER-ASSISTED NAVIGATION
Anesthesia: General | Site: Knee | Laterality: Left

## 2014-02-06 MED ORDER — LIDOCAINE HCL (CARDIAC) 20 MG/ML IV SOLN
INTRAVENOUS | Status: AC
  Filled 2014-02-06: qty 5

## 2014-02-06 MED ORDER — LACTATED RINGERS IV SOLN: INTRAVENOUS | Status: DC

## 2014-02-06 MED ORDER — PANTOPRAZOLE SODIUM 40 MG PO TBEC
40.0000 mg | DELAYED_RELEASE_TABLET | Freq: Every day | ORAL | Status: DC
  Administered 2014-02-07 – 2014-02-09 (×3): 40 mg via ORAL
  Filled 2014-02-06 (×3): qty 1

## 2014-02-06 MED ORDER — PROPOFOL 10 MG/ML IV BOLUS
INTRAVENOUS | Status: DC | PRN
  Administered 2014-02-06: 150 mg via INTRAVENOUS

## 2014-02-06 MED ORDER — ONDANSETRON HCL 4 MG/2ML IJ SOLN
4.0000 mg | Freq: Four times a day (QID) | INTRAMUSCULAR | Status: DC | PRN
  Administered 2014-02-06: 4 mg via INTRAVENOUS
  Filled 2014-02-06: qty 2

## 2014-02-06 MED ORDER — FENTANYL CITRATE 0.05 MG/ML IJ SOLN
INTRAMUSCULAR | Status: AC
  Administered 2014-02-06: 50 ug
  Filled 2014-02-06: qty 2

## 2014-02-06 MED ORDER — HYDROMORPHONE HCL PF 1 MG/ML IJ SOLN
INTRAMUSCULAR | Status: DC | PRN
  Administered 2014-02-06: 0.5 mg via INTRAVENOUS

## 2014-02-06 MED ORDER — FENTANYL CITRATE 0.05 MG/ML IJ SOLN
INTRAMUSCULAR | Status: AC
  Filled 2014-02-06: qty 5

## 2014-02-06 MED ORDER — FENTANYL CITRATE 0.05 MG/ML IJ SOLN
INTRAMUSCULAR | Status: DC | PRN
  Administered 2014-02-06 (×2): 50 ug via INTRAVENOUS
  Administered 2014-02-06: 25 ug via INTRAVENOUS
  Administered 2014-02-06: 100 ug via INTRAVENOUS
  Administered 2014-02-06: 25 ug via INTRAVENOUS

## 2014-02-06 MED ORDER — ONDANSETRON HCL 4 MG/2ML IJ SOLN
INTRAMUSCULAR | Status: AC
  Filled 2014-02-06: qty 2

## 2014-02-06 MED ORDER — DIPHENHYDRAMINE HCL 12.5 MG/5ML PO ELIX: 12.5000 mg | ORAL_SOLUTION | ORAL | Status: DC | PRN

## 2014-02-06 MED ORDER — BUPIVACAINE HCL (PF) 0.25 % IJ SOLN
INTRAMUSCULAR | Status: AC
  Filled 2014-02-06: qty 30

## 2014-02-06 MED ORDER — METHOCARBAMOL 500 MG PO TABS: 500.0000 mg | ORAL_TABLET | Freq: Four times a day (QID) | ORAL | Status: DC | PRN

## 2014-02-06 MED ORDER — KCL IN DEXTROSE-NACL 20-5-0.45 MEQ/L-%-% IV SOLN
INTRAVENOUS | Status: DC
  Administered 2014-02-07: 07:00:00 via INTRAVENOUS
  Filled 2014-02-06 (×7): qty 1000

## 2014-02-06 MED ORDER — OMEPRAZOLE MAGNESIUM 20 MG PO TBEC: 20.0000 mg | DELAYED_RELEASE_TABLET | Freq: Every day | ORAL | Status: DC

## 2014-02-06 MED ORDER — MIDAZOLAM HCL 5 MG/5ML IJ SOLN
INTRAMUSCULAR | Status: DC | PRN
  Administered 2014-02-06: 2 mg via INTRAVENOUS

## 2014-02-06 MED ORDER — METHOCARBAMOL 500 MG PO TABS
500.0000 mg | ORAL_TABLET | Freq: Four times a day (QID) | ORAL | Status: DC | PRN
  Administered 2014-02-07 – 2014-02-09 (×9): 500 mg via ORAL
  Filled 2014-02-06 (×9): qty 1

## 2014-02-06 MED ORDER — HYDROMORPHONE HCL PF 1 MG/ML IJ SOLN
INTRAMUSCULAR | Status: AC
  Filled 2014-02-06: qty 1

## 2014-02-06 MED ORDER — ASPIRIN EC 325 MG PO TBEC
325.0000 mg | DELAYED_RELEASE_TABLET | Freq: Every day | ORAL | Status: DC
Start: 2014-02-07 — End: 2014-02-09
  Administered 2014-02-07 – 2014-02-09 (×3): 325 mg via ORAL
  Filled 2014-02-06 (×4): qty 1

## 2014-02-06 MED ORDER — MIDAZOLAM HCL 2 MG/2ML IJ SOLN
INTRAMUSCULAR | Status: AC
  Administered 2014-02-06: 2 mg
  Filled 2014-02-06: qty 2

## 2014-02-06 MED ORDER — HYDROMORPHONE HCL PF 1 MG/ML IJ SOLN
0.2500 mg | INTRAMUSCULAR | Status: DC | PRN
  Administered 2014-02-06 (×2): 0.5 mg via INTRAVENOUS

## 2014-02-06 MED ORDER — SODIUM CHLORIDE 0.9 % IR SOLN
Status: DC | PRN
  Administered 2014-02-06: 1000 mL

## 2014-02-06 MED ORDER — OXYCODONE HCL 5 MG/5ML PO SOLN: 5.0000 mg | Freq: Once | ORAL | Status: DC | PRN

## 2014-02-06 MED ORDER — ONDANSETRON HCL 4 MG/2ML IJ SOLN: 4.0000 mg | Freq: Once | INTRAMUSCULAR | Status: DC | PRN

## 2014-02-06 MED ORDER — DOCUSATE SODIUM 100 MG PO CAPS
100.0000 mg | ORAL_CAPSULE | Freq: Two times a day (BID) | ORAL | Status: DC
  Administered 2014-02-06 – 2014-02-09 (×6): 100 mg via ORAL
  Filled 2014-02-06 (×9): qty 1

## 2014-02-06 MED ORDER — LACTATED RINGERS IV SOLN
INTRAVENOUS | Status: DC | PRN
  Administered 2014-02-06 (×2): via INTRAVENOUS

## 2014-02-06 MED ORDER — KETOROLAC TROMETHAMINE 15 MG/ML IJ SOLN
15.0000 mg | Freq: Four times a day (QID) | INTRAMUSCULAR | Status: AC
  Administered 2014-02-06 – 2014-02-07 (×4): 15 mg via INTRAVENOUS
  Filled 2014-02-06 (×3): qty 1

## 2014-02-06 MED ORDER — ACETAMINOPHEN 650 MG RE SUPP: 650.0000 mg | Freq: Four times a day (QID) | RECTAL | Status: DC | PRN

## 2014-02-06 MED ORDER — ACETAMINOPHEN 325 MG PO TABS: 650.0000 mg | ORAL_TABLET | Freq: Four times a day (QID) | ORAL | Status: DC | PRN

## 2014-02-06 MED ORDER — ASPIRIN EC 325 MG PO TBEC: 325.0000 mg | DELAYED_RELEASE_TABLET | Freq: Every day | ORAL | Status: DC

## 2014-02-06 MED ORDER — PHENOL 1.4 % MT LIQD: 1.0000 | OROMUCOSAL | Status: DC | PRN

## 2014-02-06 MED ORDER — MEPERIDINE HCL 25 MG/ML IJ SOLN: 6.2500 mg | INTRAMUSCULAR | Status: DC | PRN

## 2014-02-06 MED ORDER — MENTHOL 3 MG MT LOZG: 1.0000 | LOZENGE | OROMUCOSAL | Status: DC | PRN

## 2014-02-06 MED ORDER — BUPIVACAINE HCL (PF) 0.25 % IJ SOLN
INTRAMUSCULAR | Status: DC | PRN
  Administered 2014-02-06: 30 mL

## 2014-02-06 MED ORDER — PROPOFOL 10 MG/ML IV BOLUS
INTRAVENOUS | Status: AC
  Filled 2014-02-06: qty 20

## 2014-02-06 MED ORDER — ONDANSETRON HCL 4 MG PO TABS: 4.0000 mg | ORAL_TABLET | Freq: Four times a day (QID) | ORAL | Status: DC | PRN

## 2014-02-06 MED ORDER — BUPIVACAINE-EPINEPHRINE (PF) 0.5% -1:200000 IJ SOLN
INTRAMUSCULAR | Status: DC | PRN
  Administered 2014-02-06: 30 mL

## 2014-02-06 MED ORDER — LIDOCAINE HCL (CARDIAC) 20 MG/ML IV SOLN
INTRAVENOUS | Status: DC | PRN
  Administered 2014-02-06: 100 mg via INTRAVENOUS

## 2014-02-06 MED ORDER — MIDAZOLAM HCL 2 MG/2ML IJ SOLN
INTRAMUSCULAR | Status: AC
  Filled 2014-02-06: qty 2

## 2014-02-06 MED ORDER — OXYCODONE HCL 5 MG PO TABS
5.0000 mg | ORAL_TABLET | ORAL | Status: DC | PRN
  Administered 2014-02-07 – 2014-02-09 (×14): 10 mg via ORAL
  Filled 2014-02-06 (×15): qty 2

## 2014-02-06 MED ORDER — BISACODYL 10 MG RE SUPP: 10.0000 mg | Freq: Every day | RECTAL | Status: DC | PRN

## 2014-02-06 MED ORDER — ONDANSETRON HCL 4 MG/2ML IJ SOLN
INTRAMUSCULAR | Status: DC | PRN
  Administered 2014-02-06: 4 mg via INTRAVENOUS

## 2014-02-06 MED ORDER — OXYCODONE HCL 5 MG PO TABS: 5.0000 mg | ORAL_TABLET | Freq: Once | ORAL | Status: DC | PRN

## 2014-02-06 MED ORDER — MORPHINE SULFATE 2 MG/ML IJ SOLN
2.0000 mg | INTRAMUSCULAR | Status: DC | PRN
  Administered 2014-02-06 – 2014-02-07 (×5): 2 mg via INTRAVENOUS
  Filled 2014-02-06 (×5): qty 1

## 2014-02-06 MED ORDER — FLEET ENEMA 7-19 GM/118ML RE ENEM: 1.0000 | ENEMA | Freq: Once | RECTAL | Status: AC | PRN

## 2014-02-06 MED ORDER — OXYCODONE-ACETAMINOPHEN 5-325 MG PO TABS: 1.0000 | ORAL_TABLET | ORAL | Status: DC | PRN

## 2014-02-06 MED ORDER — SENNOSIDES-DOCUSATE SODIUM 8.6-50 MG PO TABS
1.0000 | ORAL_TABLET | Freq: Every evening | ORAL | Status: DC | PRN
  Administered 2014-02-08: 1 via ORAL
  Filled 2014-02-06: qty 1

## 2014-02-06 MED ORDER — KCL IN DEXTROSE-NACL 20-5-0.45 MEQ/L-%-% IV SOLN
INTRAVENOUS | Status: AC
Start: 2014-02-06 — End: 2014-02-06
  Administered 2014-02-06: 1000 mL
  Filled 2014-02-06: qty 1000

## 2014-02-06 MED ORDER — KETOROLAC TROMETHAMINE 30 MG/ML IJ SOLN
INTRAMUSCULAR | Status: AC
  Filled 2014-02-06: qty 1

## 2014-02-06 MED ORDER — DEXTROSE 5 % IV SOLN
500.0000 mg | Freq: Four times a day (QID) | INTRAVENOUS | Status: DC | PRN
  Administered 2014-02-06: 500 mg via INTRAVENOUS
  Filled 2014-02-06: qty 5

## 2014-02-06 MED ORDER — METOCLOPRAMIDE HCL 10 MG PO TABS: 5.0000 mg | ORAL_TABLET | Freq: Three times a day (TID) | ORAL | Status: DC | PRN

## 2014-02-06 MED ORDER — METOCLOPRAMIDE HCL 5 MG/ML IJ SOLN
5.0000 mg | Freq: Three times a day (TID) | INTRAMUSCULAR | Status: DC | PRN
  Administered 2014-02-06: 10 mg via INTRAVENOUS
  Filled 2014-02-06: qty 2

## 2014-02-06 MED ORDER — LACTATED RINGERS IV SOLN
INTRAVENOUS | Status: DC
  Administered 2014-02-06: 12:00:00 via INTRAVENOUS

## 2014-02-06 SURGICAL SUPPLY — 81 items
APL SKNCLS STERI-STRIP NONHPOA (GAUZE/BANDAGES/DRESSINGS) ×1
BANDAGE ELASTIC 4 VELCRO ST LF (GAUZE/BANDAGES/DRESSINGS) ×3 IMPLANT
BANDAGE ELASTIC 6 VELCRO ST LF (GAUZE/BANDAGES/DRESSINGS) ×2 IMPLANT
BANDAGE ESMARK 6X9 LF (GAUZE/BANDAGES/DRESSINGS) ×1 IMPLANT
BENZOIN TINCTURE PRP APPL 2/3 (GAUZE/BANDAGES/DRESSINGS) ×3 IMPLANT
BLADE 10 SAFETY STRL DISP (BLADE) ×3 IMPLANT
BLADE SAGITTAL 25.0X1.19X90 (BLADE) ×2 IMPLANT
BLADE SAGITTAL 25.0X1.19X90MM (BLADE) ×1
BLADE SAW SGTL 13X75X1.27 (BLADE) ×3 IMPLANT
BLADE SURG 10 STRL SS (BLADE) ×2 IMPLANT
BNDG CMPR 9X6 STRL LF SNTH (GAUZE/BANDAGES/DRESSINGS) ×1
BNDG CMPR MED 10X6 ELC LF (GAUZE/BANDAGES/DRESSINGS)
BNDG ELASTIC 6X10 VLCR STRL LF (GAUZE/BANDAGES/DRESSINGS) ×1 IMPLANT
BNDG ESMARK 6X9 LF (GAUZE/BANDAGES/DRESSINGS) ×3
BOWL SMART MIX CTS (DISPOSABLE) ×3 IMPLANT
CAPT RP KNEE ×2 IMPLANT
CEMENT HV SMART SET (Cement) ×6 IMPLANT
CLOSURE WOUND 1/2 X4 (GAUZE/BANDAGES/DRESSINGS) ×1
COVER BACK TABLE 24X17X13 BIG (DRAPES) IMPLANT
COVER SURGICAL LIGHT HANDLE (MISCELLANEOUS) ×3 IMPLANT
CUFF TOURNIQUET SINGLE 34IN LL (TOURNIQUET CUFF) ×3 IMPLANT
CUFF TOURNIQUET SINGLE 44IN (TOURNIQUET CUFF) IMPLANT
DRAPE ORTHO SPLIT 77X108 STRL (DRAPES) ×6
DRAPE SURG ORHT 6 SPLT 77X108 (DRAPES) ×2 IMPLANT
DRAPE U-SHAPE 47X51 STRL (DRAPES) ×3 IMPLANT
DRSG PAD ABDOMINAL 8X10 ST (GAUZE/BANDAGES/DRESSINGS) ×6 IMPLANT
DURAPREP 26ML APPLICATOR (WOUND CARE) ×3 IMPLANT
ELECT REM PT RETURN 9FT ADLT (ELECTROSURGICAL) ×3
ELECTRODE REM PT RTRN 9FT ADLT (ELECTROSURGICAL) ×1 IMPLANT
FACESHIELD WRAPAROUND (MASK) ×3 IMPLANT
FACESHIELD WRAPAROUND OR TEAM (MASK) ×1 IMPLANT
GAUZE XEROFORM 5X9 LF (GAUZE/BANDAGES/DRESSINGS) ×3 IMPLANT
GLOVE BIO SURGEON STRL SZ 6.5 (GLOVE) ×6 IMPLANT
GLOVE BIO SURGEONS STRL SZ 6.5 (GLOVE) ×6
GLOVE BIOGEL PI IND STRL 7.5 (GLOVE) ×1 IMPLANT
GLOVE BIOGEL PI IND STRL 8 (GLOVE) ×1 IMPLANT
GLOVE BIOGEL PI INDICATOR 7.5 (GLOVE) ×2
GLOVE BIOGEL PI INDICATOR 8 (GLOVE) ×2
GLOVE ECLIPSE 7.0 STRL STRAW (GLOVE) ×3 IMPLANT
GLOVE ORTHO TXT STRL SZ7.5 (GLOVE) ×5 IMPLANT
GOWN STRL REUS W/ TWL LRG LVL3 (GOWN DISPOSABLE) ×3 IMPLANT
GOWN STRL REUS W/ TWL XL LVL3 (GOWN DISPOSABLE) ×1 IMPLANT
GOWN STRL REUS W/TWL LRG LVL3 (GOWN DISPOSABLE) ×9
GOWN STRL REUS W/TWL XL LVL3 (GOWN DISPOSABLE) ×3
HANDPIECE INTERPULSE COAX TIP (DISPOSABLE) ×3
IMMOBILIZER KNEE 22 (SOFTGOODS) ×2 IMPLANT
IMMOBILIZER KNEE 22 UNIV (SOFTGOODS) ×3 IMPLANT
KIT BASIN OR (CUSTOM PROCEDURE TRAY) ×3 IMPLANT
KIT ROOM TURNOVER OR (KITS) ×3 IMPLANT
MANIFOLD NEPTUNE II (INSTRUMENTS) ×3 IMPLANT
MARKER SPHERE PSV REFLC THRD 5 (MARKER) ×9 IMPLANT
NDL 1/2 CIR MAYO (NEEDLE) ×1 IMPLANT
NDL HYPO 25GX1X1/2 BEV (NEEDLE) ×1 IMPLANT
NEEDLE 1/2 CIR MAYO (NEEDLE) ×3 IMPLANT
NEEDLE HYPO 25GX1X1/2 BEV (NEEDLE) ×3 IMPLANT
NS IRRIG 1000ML POUR BTL (IV SOLUTION) ×3 IMPLANT
PACK TOTAL JOINT (CUSTOM PROCEDURE TRAY) ×3 IMPLANT
PAD ABD 8X10 STRL (GAUZE/BANDAGES/DRESSINGS) ×4 IMPLANT
PAD ARMBOARD 7.5X6 YLW CONV (MISCELLANEOUS) ×6 IMPLANT
PAD CAST 4YDX4 CTTN HI CHSV (CAST SUPPLIES) ×1 IMPLANT
PADDING CAST COTTON 4X4 STRL (CAST SUPPLIES) ×3
PADDING CAST COTTON 6X4 STRL (CAST SUPPLIES) ×3 IMPLANT
PIN SCHANZ 4MM 130MM (PIN) ×12 IMPLANT
SET HNDPC FAN SPRY TIP SCT (DISPOSABLE) ×1 IMPLANT
SPONGE GAUZE 4X4 12PLY (GAUZE/BANDAGES/DRESSINGS) ×3 IMPLANT
SPONGE GAUZE 4X4 12PLY STER LF (GAUZE/BANDAGES/DRESSINGS) ×2 IMPLANT
STAPLER VISISTAT 35W (STAPLE) ×3 IMPLANT
STRIP CLOSURE SKIN 1/2X4 (GAUZE/BANDAGES/DRESSINGS) ×3 IMPLANT
SUCTION FRAZIER TIP 10 FR DISP (SUCTIONS) ×3 IMPLANT
SUT ETHIBOND NAB CT1 #1 30IN (SUTURE) ×3 IMPLANT
SUT VIC AB 0 CT1 27 (SUTURE) ×3
SUT VIC AB 0 CT1 27XBRD ANBCTR (SUTURE) IMPLANT
SUT VIC AB 2-0 CT1 27 (SUTURE) ×9
SUT VIC AB 2-0 CT1 TAPERPNT 27 (SUTURE) ×2 IMPLANT
SUT VICRYL 4-0 PS2 18IN ABS (SUTURE) ×3 IMPLANT
SUT VICRYL AB 2 0 TIES (SUTURE) ×3 IMPLANT
SYR CONTROL 10ML LL (SYRINGE) ×3 IMPLANT
TOWEL OR 17X24 6PK STRL BLUE (TOWEL DISPOSABLE) ×3 IMPLANT
TOWEL OR 17X26 10 PK STRL BLUE (TOWEL DISPOSABLE) ×3 IMPLANT
TRAY FOLEY CATH 16FRSI W/METER (SET/KITS/TRAYS/PACK) ×1 IMPLANT
WATER STERILE IRR 1000ML POUR (IV SOLUTION) ×9 IMPLANT

## 2014-02-06 NOTE — Interval H&P Note (Signed)
History and Physical Interval Note:  02/06/2014 12:58 PM  Taylor Schroeder  has presented today for surgery, with the diagnosis of Osteoarthritis Left Knee  The various methods of treatment have been discussed with the patient and family. After consideration of risks, benefits and other options for treatment, the patient has consented to  Procedure(s) with comments: COMPUTER ASSISTED TOTAL KNEE ARTHROPLASTY (Left) - Cemented Left Total Knee Arthroplasty as a surgical intervention .  The patient's history has been reviewed, patient examined, no change in status, stable for surgery.  I have reviewed the patient's chart and labs.  Questions were answered to the patient's satisfaction.     [name]

## 2014-02-06 NOTE — Plan of Care (Signed)
Problem: Consults Goal: Diagnosis- Total Joint Replacement Primary Total Knee Left     

## 2014-02-06 NOTE — Brief Op Note (Cosign Needed)
02/06/2014  3:40 PM  PATIENT:  Z[place] Jarred Speegle  48 y.o. male  PRE-OPERATIVE DIAGNOSIS:  Osteoarthritis Left Knee  POST-OPERATIVE DIAGNOSIS:  Osteoarthritis Left Knee  PROCEDURE:  Procedure(s) with comments: COMPUTER ASSISTED TOTAL KNEE ARTHROPLASTY (Left) - Cemented Left Total Knee Arthroplasty  SURGEON:  Surgeon(s) and Role:    * [name], MD - Primary  PHYSICIAN ASSISTANT: Phillips [facility]   ASSISTANTS: none   ANESTHESIA:   general  EBL:  Total I/O In: 1000 [I.V.:1000] Out: 100 [Blood:100]  BLOOD ADMINISTERED:none  DRAINS: none   LOCAL MEDICATIONS USED:  MARCAINE     SPECIMEN:  No Specimen  DISPOSITION OF SPECIMEN:  N/A   COUNTS:  YES  TOURNIQUET:   Total Tourniquet Time Documented: Thigh (laterality) - 60 minutes Total: Thigh (laterality) - 60 minutes   DICTATION: note in chart  PLAN OF CARE: Admit to inpatient   PATIENT DISPOSITION:  PACU - hemodynamically stable.   Delay start of Pharmacological VTE agent (>24hrs) due to surgical blood loss or risk of bleeding: no

## 2014-02-06 NOTE — Transfer of Care (Signed)
Immediate Anesthesia Transfer of Care Note  Patient: Taylor Schroeder  Procedure(s) Performed: Procedure(s) with comments: COMPUTER ASSISTED TOTAL KNEE ARTHROPLASTY (Left) - Cemented Left Total Knee Arthroplasty  Patient Location: PACU  Anesthesia Type:General  Level of Consciousness: awake, alert  and oriented  Airway & Oxygen Therapy: Patient connected to nasal cannula oxygen  Post-op Assessment: Report given to PACU RN  Post vital signs: stable  Complications: No apparent anesthesia complications

## 2014-02-06 NOTE — Discharge Instructions (Signed)
Keep knee incision dry for 5 days post op then may wet while bathing. Therapy daily and  goal full extension and greater than 90 degrees flexion. Call if fever or chills or increased drainage. Go to ER if acutely short of breath or call for ambulance. Return for follow up in 2 weeks. May full weight bear on the surgical leg unless told otherwise. Use knee immobilizer until able to straight leg raise off bed with knee stable. In house walking for first 2 weeks. Ice packs daily to knee for pain and swelling

## 2014-02-06 NOTE — Anesthesia Postprocedure Evaluation (Signed)
Anesthesia Post Note  Patient: Taylor Schroeder  Procedure(s) Performed: Procedure(s) (LRB): COMPUTER ASSISTED TOTAL KNEE ARTHROPLASTY (Left)  Anesthesia type: general  Patient location: PACU  Post pain: Pain level controlled  Post assessment: Patient's Cardiovascular Status Stable  Last Vitals:  Filed Vitals:   02/06/14 1745  BP: 115/78  Pulse: 86  Temp: 36.6 C  Resp: 16    Post vital signs: Reviewed and stable  Level of consciousness: sedated  Complications: No apparent anesthesia complications

## 2014-02-06 NOTE — Anesthesia Preprocedure Evaluation (Signed)
Anesthesia Evaluation  Patient identified by MRN, date of birth, ID band Patient awake    Reviewed: Allergy & Precautions, H&P , NPO status , Patient's Chart, lab work & pertinent test results  Airway Mallampati: I TM Distance: >3 FB Neck ROM: Full    Dental   Pulmonary former smoker,          Cardiovascular     Neuro/Psych    GI/Hepatic GERD-  Medicated and Controlled,  Endo/Other    Renal/GU      Musculoskeletal   Abdominal   Peds  Hematology   Anesthesia Other Findings   Reproductive/Obstetrics                           Anesthesia Physical Anesthesia Plan  ASA: II  Anesthesia Plan: General   Post-op Pain Management:    Induction: Intravenous  Airway Management Planned: LMA  Additional Equipment:   Intra-op Plan:   Post-operative Plan: Extubation in OR  Informed Consent: I have reviewed the patients History and Physical, chart, labs and discussed the procedure including the risks, benefits and alternatives for the proposed anesthesia with the patient or authorized representative who has indicated his/her understanding and acceptance.     Plan Discussed with: CRNA and Surgeon  Anesthesia Plan Comments:         Anesthesia Quick Evaluation

## 2014-02-06 NOTE — Anesthesia Procedure Notes (Addendum)
Anesthesia Regional Block:  Adductor canal block  Pre-Anesthetic Checklist: ,, timeout performed, Correct Patient, Correct Site, Correct Laterality, Correct Procedure, Correct Position, site marked, Risks and benefits discussed,  Surgical consent,  Pre-op evaluation,  At surgeon's request and post-op pain management  Laterality: Left  Prep: chloraprep       Needles:  Injection technique: Single-shot  Needle Type: Echogenic Stimulator Needle     Needle Length: 9cm 9 cm Needle Gauge: 21 and 21 G    Additional Needles:  Procedures: ultrasound guided (picture in chart) Adductor canal block Narrative:  Start time: 02/06/2014 12:55 PM End time: 02/06/2014 1:10 PM Injection made incrementally with aspirations every 5 mL.  Performed by: Personally  Anesthesiologist: Lillia Abed MD  Additional Notes: Monitors applied. Patient sedated. Sterile prep and drape,hand hygiene and sterile gloves were used. Relevant anatomy identified.Needle position confirmed.Local anesthetic injected incrementally after negative aspiration. Local anesthetic spread visualized around nerve(s). Vascular puncture avoided. No complications. Image printed for medical record.The patient tolerated the procedure well.    Lillia Abed MD   Procedure Name: LMA Insertion Date/Time: 02/06/2014 1:39 PM Performed by: Judeth Cornfield T Pre-anesthesia Checklist: Patient identified, Timeout performed, Emergency Drugs available, Suction available and Patient being monitored Patient Re-evaluated:Patient Re-evaluated prior to inductionOxygen Delivery Method: Circle system utilized Preoxygenation: Pre-oxygenation with 100% oxygen Intubation Type: IV induction Ventilation: Mask ventilation without difficulty LMA: LMA inserted LMA Size: 4.0 Number of attempts: 1 Tube secured with: Tape Dental Injury: Teeth and Oropharynx as per pre-operative assessment

## 2014-02-06 NOTE — Progress Notes (Signed)
Orthopedic Tech Progress Note Patient Details:  Taylor Schroeder Aug 02, 1966 [id number]  CPM Left Knee CPM Left Knee: On Left Knee Flexion (Degrees): 60 Left Knee Extension (Degrees): 0 Additional Comments: applied overhead frame  Ortho Devices Ortho Device/Splint Location: footsie roll Ortho Device/Splint Interventions: Ordered   [name] 02/06/2014, 4:43 PM

## 2014-02-06 NOTE — H&P (Signed)
TOTAL KNEE ADMISSION H&P  Patient is being admitted for left total knee arthroplasty.  Subjective:  Chief Complaint:left knee pain.  HPI: Taylor Schroeder, 48 y.o. male, has a history of pain and functional disability in the left knee due to arthritis and has failed non-surgical conservative treatments for greater than 12 weeks to includeNSAID's and/or analgesics, corticosteriod injections and activity modification.  Onset of symptoms was gradual, starting 2 years ago with gradually worsening course since that time. The patient noted no past surgery on the left knee(s).  Patient currently rates pain in the left knee(s) at 6 out of 10 with activity. Patient has worsening of pain with activity and weight bearing.  Patient has evidence of subchondral sclerosis, periarticular osteophytes and joint space narrowing by imaging studies.  There is no active infection.  Patient Active Problem List   Diagnosis Date Noted  . Neck pain on left side 10/03/2013  . Acute sinusitis, unspecified 10/03/2013  . DDD (degenerative disc disease), cervical 10/03/2013  . Esophageal ring, acquired 07/12/2013  . Crohn's ileitis - working dx 06/01/2013  . BPH (benign prostatic hyperplasia) 04/18/2013  . Pernicious anemia-B12 deficiency 04/18/2013  . Obesity, unspecified 12/21/2012  . GERD (gastroesophageal reflux disease) 12/21/2012  . Other abnormal glucose 08/29/2012  . Routine general medical examination at a health care facility 08/29/2012   Past Medical History  Diagnosis Date  . Arthritis   . GERD (gastroesophageal reflux disease)   . Coronary artery disease Non-obstructive    a. nonobs cath in 2006;  b. 01/2013 Cath: LM nl, LAD 74m, LCX nl, RCA non-dom, nl. EF 60-65%.  . Obesity   . BPH (benign prostatic hyperplasia)   . Anemia   . Pernicious anemia-B12 deficiency 04/18/2013  . Crohn's ileitis - working dx 06/01/2013  . Esophageal ring, acquired 07/12/2013    Past Surgical History  Procedure Laterality  Date  . Appendectomy    . Cardiac catheterization    . Carpal tunnel release Right   . Shoulder arthroscopy Left 03/12/2013  . Inguinal hernia repair    . Colonoscopy    . Esophagogastroduodenoscopy      Prescriptions prior to admission  Medication Sig Dispense Refill  . cyanocobalamin 2000 MCG tablet Take 1 tablet (2,000 mcg total) by mouth daily.  90 tablet  3  . ibuprofen (ADVIL,MOTRIN) 200 MG tablet Take 800 mg by mouth daily as needed for moderate pain.       Marland Kitchen omeprazole (PRILOSEC OTC) 20 MG tablet Take 20 mg by mouth daily.        Allergies  Allergen Reactions  . Allegra [Fexofenadine Hcl] Hives  . Demerol [Meperidine] Nausea And Vomiting  . Fexofenadine Other (See Comments)    Unknown-reaction  . Meperidine Hcl     "bad things" happen when I take it    History  Substance Use Topics  . Smoking status: Former Smoker -- 2.00 packs/day for 30 years    Types: Cigarettes    Quit date: 08/29/2008  . Smokeless tobacco: Never Used  . Alcohol Use: 1.2 oz/week    2 Glasses of wine per week     Comment: socially    Family History  Problem Relation Age of Onset  . Hypertension Mother   . Cancer Father     type unknown  . Alcohol abuse Neg Hx   . Diabetes Neg Hx   . Early death Neg Hx   . Heart disease Neg Hx   . Hyperlipidemia Neg Hx   .  Kidney disease Neg Hx   . Stroke Neg Hx   . COPD Other   . Emphysema Maternal Grandmother   . Emphysema Maternal Grandfather   . Other Paternal Grandmother     spinal cancer     Review of Systems  Musculoskeletal: Positive for joint pain.       Left >right knee  All other systems reviewed and are negative.   Objective:  Physical Exam  Constitutional: He is oriented to person, place, and time. He appears well-developed and well-nourished.  HENT:  Head: Normocephalic.  Eyes: EOM are normal. Pupils are equal, round, and reactive to light.  Neck: Normal range of motion.  Cardiovascular: Normal rate.   Respiratory: Effort  normal.  GI: Soft.  Musculoskeletal:   Knees show bilateral varus deformity.  He has 5 to 110 degrees, symmetrical.  Laxity of the medial collateral ligament with stress from medial compartment erosive wear.  Patellofemoral crepitus.  Neurological: He is alert and oriented to person, place, and time.  Skin: Skin is warm and dry.  Psychiatric: He has a normal mood and affect.    Vital signs in last 24 hours: Temp:  [97.9 F (36.6 C)] 97.9 F (36.6 C) (05/13 1120) Pulse Rate:  [90] 90 (05/13 1120) Resp:  [18] 18 (05/13 1120) BP: (132)/(79) 132/79 mmHg (05/13 1120) SpO2:  [99 %] 99 % (05/13 1120) Weight:  [107.502 kg (237 lb)] 107.502 kg (237 lb) (05/13 1120)  Labs:   Estimated body mass index is 34.98 kg/(m^2) as calculated from the following:   Height as of this encounter: 5\' 9"  (1.753 m).   Weight as of this encounter: 107.502 kg (237 lb).   Imaging Review Plain radiographs demonstrate moderate degenerative joint disease of the left knee(s). The overall alignment ismild varus. The bone quality appears to be satisfactory for age and reported activity level.  Assessment/Plan:  End stage arthritis, left knee   The patient history, physical examination, clinical judgment of the provider and imaging studies are consistent with end stage degenerative joint disease of the left knee(s) and total knee arthroplasty is deemed medically necessary. The treatment options including medical management, injection therapy arthroscopy and arthroplasty were discussed at length. The risks and benefits of total knee arthroplasty were presented and reviewed. The risks due to aseptic loosening, infection, stiffness, patella tracking problems, thromboembolic complications and other imponderables were discussed. The patient acknowledged the explanation, agreed to proceed with the plan and consent was signed. Patient is being admitted for inpatient treatment for surgery, pain control, PT, OT, prophylactic  antibiotics, VTE prophylaxis, progressive ambulation and ADL's and discharge planning. The patient is planning to be discharged home with home health services

## 2014-02-07 ENCOUNTER — Encounter (HOSPITAL_COMMUNITY): Payer: Self-pay | Admitting: Orthopaedic Surgery

## 2014-02-07 LAB — BASIC METABOLIC PANEL
BUN: 16 mg/dL (ref 6–23)
CHLORIDE: 106 meq/L (ref 96–112)
CO2: 24 mEq/L (ref 19–32)
CREATININE: 1.09 mg/dL (ref 0.50–1.35)
Calcium: 8.5 mg/dL (ref 8.4–10.5)
GFR calc non Af Amer: 79 mL/min — ABNORMAL LOW (ref >90)
Glucose, Bld: 95 mg/dL (ref 70–99)
Potassium: 4.1 mEq/L (ref 3.7–5.3)
Sodium: 142 mEq/L (ref 137–147)

## 2014-02-07 LAB — CBC
HCT: 34.7 % — ABNORMAL LOW (ref 39.0–52.0)
Hemoglobin: 11.6 g/dL — ABNORMAL LOW (ref 13.0–17.0)
MCH: 29.7 pg (ref 26.0–34.0)
MCHC: 33.4 g/dL (ref 30.0–36.0)
MCV: 89 fL (ref 78.0–100.0)
PLATELETS: 228 10*3/uL (ref 150–400)
RBC: 3.9 MIL/uL — ABNORMAL LOW (ref 4.22–5.81)
RDW: 13.6 % (ref 11.5–15.5)
WBC: 8.9 10*3/uL (ref 4.0–10.5)

## 2014-02-07 NOTE — Progress Notes (Signed)
Occupational Therapy Evaluation and Discharge Patient Details Name: Taylor Schroeder MRN: [id number] DOB: June 22, 1966 Today's Date: 02/07/2014    History of Present Illness 48 y.o. male s/p left TKA. History of degenerative disc disease and cardiac catheterization.   Clinical Impression   PTA pt lived at home with family and used a cane for long distance ambulation. Pt reports that someone will be with his 24/7 to assist with ADLs. ADL training and education completed with pt and family. Pt has no further acute OT needs.     Follow Up Recommendations  No OT follow up;Supervision/Assistance - 24 hour    Equipment Recommendations  3 in 1 bedside comode       Precautions / Restrictions Precautions Precautions: Knee Precaution Booklet Issued: Yes (comment) Precaution Comments: reviewed handout for precautions and exercises Required Braces or Orthoses: Knee Immobilizer - Left Restrictions Weight Bearing Restrictions: Yes LLE Weight Bearing: Weight bearing as tolerated      Mobility Bed Mobility               General bed mobility comments: Not assessed- pt in recliner before and after therapy.   Transfers Overall transfer level: Needs assistance Equipment used: Rolling walker (2 wheeled) Transfers: Sit to/from Stand Sit to Stand: Min guard         General transfer comment: Verbal cues for hand placement.    Balance Overall balance assessment: Needs assistance Sitting-balance support: No upper extremity supported;Feet supported Sitting balance-Leahy Scale: Fair     Standing balance support: Bilateral upper extremity supported;During functional activity Standing balance-Leahy Scale: Poor Standing balance comment: Requires UE support on RW.                            ADL Overall ADL's : Needs assistance/impaired Eating/Feeding: Independent;Sitting   Grooming: Min guard;Standing   Upper Body Bathing: Set up;Sitting   Lower Body Bathing: Min  guard;Sit to/from stand   Upper Body Dressing : Set up;Sitting   Lower Body Dressing: Minimal assistance;Sit to/from stand   Toilet Transfer: Min guard;Stand-pivot;BSC;RW   Toileting- Water quality scientist and Hygiene: Min guard;Sit to/from stand       Functional mobility during ADLs: Min guard;Rolling walker General ADL Comments: Pt overall at min guard level for ADLs and functional mobility. Educated pt on benefit of donning/doffing socks and shoes to promote flexion in L knee. Demonstrated tub transfer with use of 3N1 and RW and pt stated that he understood. Pt reports that he will have assistance 24/7 at home. Educated pt on fall prevention strategies and energy conservation techniques.      Vision  Per pt report, no change from baseline.                   Perception Perception Perception Tested?: No   Praxis Praxis Praxis tested?: Within functional limits    Pertinent Vitals/Pain No c/o pain; pt c/o fatigue. Pt declined opportunity to transfer from recliner to bed for increased comfort.     Hand Dominance Right   Extremity/Trunk Assessment Upper Extremity Assessment Upper Extremity Assessment: Overall WFL for tasks assessed   Lower Extremity Assessment Lower Extremity Assessment: Defer to PT evaluation       Communication Communication Communication: No difficulties   Cognition Arousal/Alertness: Awake/alert Behavior During Therapy: WFL for tasks assessed/performed Overall Cognitive Status: Within Functional Limits for tasks assessed  Home Living Family/patient expects to be discharged to:: Private residence Living Arrangements: Spouse/significant other Available Help at Discharge: Family;Available 24 hours/day Type of Home: House Home Access: Stairs to enter [employer] of Steps: 2 Entrance Stairs-Rails: Right;Left Home Layout: One level     Bathroom Shower/Tub: Tub/shower unit Shower/tub  characteristics: Curtain Biochemist, clinical: Standard     Home Equipment: Cane - single point          Prior Functioning/Environment Level of Independence: Independent with assistive device(s)        Comments: Used cane for ambulation with long distances. Works full time in a factory, on his feet 8 hour shifts.                              End of Session Equipment Utilized During Treatment: Rolling walker CPM Left Knee CPM Left Knee: Off Nurse Communication: Patient requests pain meds  Activity Tolerance: Patient limited by fatigue Patient left: in chair;with call bell/phone within reach;with family/visitor present   Time: 0272-5366 OT Time Calculation (min): 18 min Charges:  OT General Charges $OT Visit: 1 Procedure OT Evaluation $Initial OT Evaluation Tier I: 1 Procedure OT Treatments $Self Care/Home Management : 8-22 mins  Juluis Rainier [phone] 02/07/2014, 2:36 PM

## 2014-02-07 NOTE — Op Note (Signed)
NAMEHUNTLEY, KNOOP               ACCOUNT NO.:  [id number]  MEDICAL RECORD NO.:  [id number]  LOCATION:  5N26C                        FACILITY:  [facility]  PHYSICIAN:  Quinterious Walraven C. Lorin Mercy, M.D.    DATE OF BIRTH:  18-Mar-1966  DATE OF PROCEDURE:  02/06/2014 DATE OF DISCHARGE:                              OPERATIVE REPORT   PREOPERATIVE DIAGNOSIS:  Left knee osteoarthritis.  POSTOPERATIVE DIAGNOSIS:  Left knee osteoarthritis.  PROCEDURE:  Left total knee arthroplasty, cemented, computer assist.  SURGEON:  Alonna Bartling C. Lorin Mercy, M.D.  ASSISTANT:  Epimenio Foot, PA-C, medically necessary and present for the entire procedure.  ESTIMATED BLOOD LOSS:  Less than 100 mL.  TOURNIQUET TIME:  60 minutes.  DRAINS:  None.  ANESTHESIA:  GOT plus Marcaine local.  IMPLANTS:  [employer] DePuy #4 femur with lugs, 12.5 poly, #4 tibial plateau, 38-mm 3-pegged patella.  DESCRIPTION OF PROCEDURE:  After standard prepping and draping, proximal thigh tourniquet application, DuraPrep was used and preoperative Ancef was given prophylactically.  Usual total knee sheets, drapes, sterile skin marker, Betadine, Steri-Drape, stockinette and Coban had been applied.  Time-out procedure was completed.  Leg was wrapped in Esmarch, tourniquet inflated.  Midline incision was made.  Superficial retinaculum was divided.  Medial parapatellar tendon approach was made, splitting the quad tendon between the medial one-third and lateral two- thirds.  Patella was everted, 10 mm cut off the posterior aspect of bone.  There was grade 4 changes, most severe in the medial compartment with worn 1-cm deep grooves with exposed subchondral bone and marginal osteophytes tricompartmental.  Pins were placed inside the incision on the femur and stab incision was made.  Two mid tibial pins were made and the computer models were generated, tibia first followed by femur.  The patient had 10-degree varus deformity due to the medial  compartment wire, had hyperextension of 1.8 degrees.  A 9 mm taken off the distal cut on the femur, 9 on the tibia.  This took more lateral bone than medial bone on the tibia.  Chamfer cuts were made on the femur.  Box cut was not made until the tibia cuts were made.  Keel preparation, then box cut was made.  Trials were inserted.  There was full extension. Computer pins were removed.  Flexion and extension blocks were checked and trials with 12.5 spacer block gave a nice fit due to the medial compartment wear in order to take some bone medially where the medial side had scalloped out on the medial tibial plateau.  An additional 2 mm had been taken.  All meniscal remnants were resected as well as the PCL. Pulsatile lavage was used for preparation.  Cement was vacuum mixed. Tibia was cemented first followed by femur, placement of the permanent 12.5 poly and then patellar button held with the clamp.  All excess cement was removed.  Knee reached full extension and good flexion. Collateral ligament balance was excellent.  The computer showed 0.5-mm difference in flexion-extension balance and 90 degrees flexion and full extension.  Cement was hardened at 15 minutes.  Tourniquet was deflated, hemostasis obtained and standard layered closure with 0 and #1 sutures in the deep retinaculum,  2-0 Vicryl in the superficial and subcutaneous tissue, and then subcuticular closure, postop dressing, Steri-Strips and knee immobilizer.  Instrument count and needle count were correct.     Darlys Buis C. Lorin Mercy, M.D.     MCY/MEDQ  D:  02/06/2014  T:  02/07/2014  Job:  [id number]

## 2014-02-07 NOTE — Progress Notes (Signed)
Physical Therapy Treatment Patient Details Name: Taylor Schroeder MRN: [id number] DOB: Jul 19, 1966 Today's Date: 02/07/2014    History of Present Illness 48 y.o. male s/p left TKA. History of degenerative disc disease and cardiac catheterization.    PT Comments    Pt progressing slowly with physical therapy this afternoon, limited again by pain however reports he is doing much better compared to this AM. Ambulated up to 20 feet with min guard for safety. Performed therapeutic exercises and is showing improved strength in right lower extremity. Patient will continue to benefit from skilled physical therapy services to further improve independence with functional mobility.   Follow Up Recommendations  Home health PT;Supervision/Assistance - 24 hour     Equipment Recommendations  Rolling walker with 5" wheels;3in1 (PT)    Recommendations for Other Services OT consult     Precautions / Restrictions Precautions Precautions: Knee Precaution Booklet Issued: Yes (comment) Precaution Comments: reviewed handout for precautions and exercises Required Braces or Orthoses: Knee Immobilizer - Left Restrictions Weight Bearing Restrictions: Yes LLE Weight Bearing: Weight bearing as tolerated    Mobility  Bed Mobility Overal bed mobility: Needs Assistance Bed Mobility: Supine to Sit;Sit to Supine     Supine to sit: Supervision;HOB elevated Sit to supine: Supervision   General bed mobility comments: Supervision for safety with cues for technique. Pt requires extra time, but did not need physical assist. HOB flat with sit to supine but elevated with supine to sit  Transfers Overall transfer level: Needs assistance Equipment used: Rolling walker (2 wheeled) Transfers: Sit to/from Stand Sit to Stand: Min guard         General transfer comment: Min guard +2 for safety from lowest bed setting. PT demonstrated technique and required verbal cues for hand placement. Needs extra time and cues  to decrease WB through LUE when grabbing walker  Ambulation/Gait Ambulation/Gait assistance: Min guard Ambulation Distance (Feet): 20 Feet Assistive device: Rolling walker (2 wheeled) Gait Pattern/deviations: Step-to pattern;Step-through pattern;Decreased step length - right;Decreased stance time - left;Antalgic Gait velocity: slowed   General Gait Details: min guard for safety, distance limited by pain this afternoon. Better control of RW and increasing WB through LLE. Continues to use UEs heavily. Very slow/guarded pace. No evidence of knee instability in knee immobilizer   Stairs            Wheelchair Mobility    Modified Rankin (Stroke Patients Only)       Balance Overall balance assessment: Needs assistance Sitting-balance support: No upper extremity supported;Feet supported Sitting balance-Leahy Scale: Fair     Standing balance support: Bilateral upper extremity supported;During functional activity Standing balance-Leahy Scale: Poor Standing balance comment: Requires UE support on RW.                    Cognition Arousal/Alertness: Awake/alert Behavior During Therapy: WFL for tasks assessed/performed Overall Cognitive Status: Within Functional Limits for tasks assessed                      Exercises Total Joint Exercises Ankle Circles/Pumps: AROM;Both;10 reps;Seated Quad Sets: AROM;Left;Supine;10 reps Heel Slides: AAROM;Left;10 reps;Supine Hip ABduction/ADduction: AROM;Left;10 reps;Supine Straight Leg Raises: AAROM;Left;10 reps;Supine    General Comments General comments (skin integrity, edema, etc.): reviewed exercises and importance of being OOB as much as possible. Pt verbalizes understanding of precautions but states he is unable to tolerate "zero knee" for extended periods of time.      Pertinent Vitals/Pain No numerical value given for pain.  Reports his pain is much better than the "20/10" pain he was having earlier today Patient  repositioned in bed for comfort.     Home Living Family/patient expects to be discharged to:: Private residence Living Arrangements: Spouse/significant other Available Help at Discharge: Family;Available 24 hours/day Type of Home: House Home Access: Stairs to enter Entrance Stairs-Rails: Right;Left Home Layout: One level Home Equipment: Cane - single point      Prior Function Level of Independence: Independent with assistive device(s)      Comments: Used cane for ambulation with long distances. Works full time in a factory, on his feet 8 hour shifts.   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 PT Goal Formulation: With patient Time For Goal Achievement: 02/14/14 Potential to Achieve Goals: Good Progress towards PT goals: Progressing toward goals    Frequency  7X/week    PT Plan Current plan remains appropriate    Co-evaluation             End of Session Equipment Utilized During Treatment: Left knee immobilizer Activity Tolerance: Patient limited by pain Patient left: with call bell/phone within reach;in bed;with family/visitor present     Time: 1451-1516 PT Time Calculation (min): 25 min  Charges:  $Gait Training: 8-22 mins $Therapeutic Exercise: 8-22 mins                    G Codes:      [place], [place]  Ellouise Newer 02/07/2014, 4:00 PM

## 2014-02-07 NOTE — Progress Notes (Signed)
Orthopedic Tech Progress Note Patient Details:  Taylor Schroeder [birth year] [id number] On cpm at 7:45 pm LLE 0-60 Patient ID: [name], male   DOB: 1965/12/08, 48 y.o.   MRN: [id number]   [name] 02/07/2014, 7:44 PM

## 2014-02-07 NOTE — Progress Notes (Signed)
Utilization review completed.  

## 2014-02-07 NOTE — Progress Notes (Signed)
Subjective: 1 Day Post-Op Procedure(s) (LRB): COMPUTER ASSISTED TOTAL KNEE ARTHROPLASTY (Left) Patient reports pain as moderate.    Objective: Vital signs in last 24 hours: Temp:  [97.7 F (36.5 C)-98.3 F (36.8 C)] 98 F (36.7 C) (05/14 0646) Pulse Rate:  [86-114] 86 (05/14 0646) Resp:  [8-18] 16 (05/14 0646) BP: (100-152)/(67-96) 100/67 mmHg (05/14 0646) SpO2:  [92 %-99 %] 95 % (05/14 0646) Weight:  [107.502 kg (237 lb)] 107.502 kg (237 lb) (05/13 1120)  Intake/Output from previous day: 05/13 0701 - 05/14 0700 In: 2670 [P.O.:720; I.V.:1950] Out: 900 [Urine:800; Blood:100] Intake/Output this shift:     Recent Labs  02/07/14 0545  HGB 11.6*    Recent Labs  02/07/14 0545  WBC 8.9  RBC 3.90*  HCT 34.7*  PLT 228    Recent Labs  02/07/14 0545  NA 142  K 4.1  CL 106  CO2 24  BUN 16  CREATININE 1.09  GLUCOSE 95  CALCIUM 8.5   No results found for this basename: LABPT, INR,  in the last 72 hours  Neurologically intact  Assessment/Plan: 1 Day Post-Op Procedure(s) (LRB): COMPUTER ASSISTED TOTAL KNEE ARTHROPLASTY (Left) Up with therapy saline lock IV.   Taylor Schroeder 02/07/2014, 7:53 AM

## 2014-02-07 NOTE — Evaluation (Signed)
Physical Therapy Evaluation Patient Details Name: Taylor Schroeder MRN: [id number] DOB: [birth year] Today's Date: 02/07/2014   History of Present Illness  48 y.o. male s/p left TKA. History of degenerative disc disease and cardiac catheterization.  Clinical Impression  Pt is s/p left TKA resulting in the deficits listed below (see PT Problem List). Pt ambulatory distance limited by pain this morning but was able to safely transfer with PT and perform therapeutic exercises. Pt will benefit from skilled PT to increase their independence and safety with mobility to allow discharge to the venue listed below.      Follow Up Recommendations Home health PT;Supervision/Assistance - 24 hour    Equipment Recommendations  Rolling walker with 5" wheels;3in1 (PT)    Recommendations for Other Services OT consult     Precautions / Restrictions Precautions Precautions: Knee Precaution Booklet Issued: Yes (comment) Precaution Comments: reviewed handout for precautions and exercises Required Braces or Orthoses: Knee Immobilizer - Left Restrictions Weight Bearing Restrictions: Yes LLE Weight Bearing: Weight bearing as tolerated      Mobility  Bed Mobility Overal bed mobility: Needs Assistance Bed Mobility: Supine to Sit     Supine to sit: Min assist;HOB elevated     General bed mobility comments: Min assist for LLE support to off of bed. HOB elevated. Cues for technique and requires extra time  Transfers Overall transfer level: Needs assistance Equipment used: Rolling walker (2 wheeled) Transfers: Sit to/from Stand Sit to Stand: Min guard;+2 safety/equipment         General transfer comment: Min guard +2 for safety from lowest bed setting. PT demonstrated technique and required verbal cues for hand placement.  Ambulation/Gait Ambulation/Gait assistance: Min assist;+2 safety/equipment Ambulation Distance (Feet): 6 Feet Assistive device: Rolling walker (2 wheeled) Gait  Pattern/deviations: Step-to pattern;Decreased step length - right;Decreased stance time - left;Antalgic;Trunk flexed Gait velocity: slowed   General Gait Details: min assist for RW control and sequencing. Verbal cues for left knee extension and quadriceps activation in left stance phase. Mild instability of L knee while in knee immobilizer but no instances of buckling requiring physical assist or block. Distance limited by pain.  Stairs            Wheelchair Mobility    Modified Rankin (Stroke Patients Only)       Balance Overall balance assessment: Needs assistance Sitting-balance support: No upper extremity supported;Feet supported Sitting balance-Leahy Scale: Fair Sitting balance - Comments: sits EOB without assist   Standing balance support: Bilateral upper extremity supported Standing balance-Leahy Scale: Poor Standing balance comment: Requires UE support on RW                             Pertinent Vitals/Pain 10/10 pain Nurse notified and is aware - brought pain medication at end of therapy session Patient repositioned in chair for comfort with knee positioned for optimal extension    Home Living Family/patient expects to be discharged to:: Private residence Living Arrangements: Spouse/significant other Available Help at Discharge: Family;Available 24 hours/day Type of Home: House Home Access: Stairs to enter Entrance Stairs-Rails: Psychiatric nurse of Steps: 2 Home Layout: One level Home Equipment: Cane - single point      Prior Function Level of Independence: Independent with assistive device(s)         Comments: Used cane for ambulation with long distances. Works full time in a factory, on his feet 8 hour shifts.     Hand Dominance  Dominant Hand: Right    Extremity/Trunk Assessment   Upper Extremity Assessment: Defer to OT evaluation           Lower Extremity Assessment: LLE deficits/detail   LLE Deficits /  Details: decreased strength and ROM     Communication   Communication: No difficulties  Cognition Arousal/Alertness: Awake/alert Behavior During Therapy: WFL for tasks assessed/performed Overall Cognitive Status: Within Functional Limits for tasks assessed                      General Comments General comments (skin integrity, edema, etc.): SLR extensor lag >10 degrees on LLE. Pt with 10/10 pain. Reviewed exercises and precautions.    Exercises Total Joint Exercises Ankle Circles/Pumps: AROM;Both;10 reps;Seated Quad Sets: AROM;Left;15 reps;Supine Straight Leg Raises: AAROM;Left;5 reps;Seated      Assessment/Plan    PT Assessment Patient needs continued PT services  PT Diagnosis Difficulty walking;Abnormality of gait;Acute pain   PT Problem List Decreased strength;Decreased range of motion;Decreased activity tolerance;Decreased balance;Decreased mobility;Decreased knowledge of use of DME;Decreased knowledge of precautions;Obesity;Pain  PT Treatment Interventions DME instruction;Gait training;Stair training;Functional mobility training;Therapeutic activities;Therapeutic exercise;Balance training;Neuromuscular re-education;Patient/family education;Modalities   PT Goals (Current goals can be found in the Care Plan section) Acute Rehab PT Goals Patient Stated Goal: be able to 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 league again PT Goal Formulation: With patient Time For Goal Achievement: 02/14/14 Potential to Achieve Goals: Good    Frequency 7X/week   Barriers to discharge        Co-evaluation               End of Session Equipment Utilized During Treatment: Left knee immobilizer Activity Tolerance: Patient limited by pain Patient left: in chair;with call bell/phone within reach Nurse Communication: Mobility status;Patient requests pain meds         Time: 0905-0940 PT Time Calculation (min): 35 min   Charges:   PT Evaluation $Initial PT Evaluation Tier I: 1 Procedure PT  Treatments $Therapeutic Exercise: 8-22 mins $Therapeutic Activity: 8-22 mins   PT G Codes:         Elayne Snare, [place] V[place] 02/07/2014, 10:08 AM

## 2014-02-08 LAB — CBC
HEMATOCRIT: 34.1 % — AB (ref 39.0–52.0)
Hemoglobin: 11.5 g/dL — ABNORMAL LOW (ref 13.0–17.0)
MCH: 30.3 pg (ref 26.0–34.0)
MCHC: 33.7 g/dL (ref 30.0–36.0)
MCV: 90 fL (ref 78.0–100.0)
PLATELETS: 212 10*3/uL (ref 150–400)
RBC: 3.79 MIL/uL — ABNORMAL LOW (ref 4.22–5.81)
RDW: 13.4 % (ref 11.5–15.5)
WBC: 10 10*3/uL (ref 4.0–10.5)

## 2014-02-08 NOTE — Progress Notes (Signed)
Oxycodone 10 mg removed per patient request- too early and returned.

## 2014-02-08 NOTE — Care Management Note (Signed)
CARE MANAGEMENT NOTE 02/08/2014  Patient:  Taylor Schroeder, Taylor Schroeder   Account Number:  [id number]  Date Initiated:  02/08/2014  Documentation initiated by:  Ricki Miller  Subjective/Objective Assessment:   48 yr old male s/p left total knee arthroplasty.     Action/Plan:   Case manager spoke with patient concerning home health and DME needs at discharge.Choice offered. Referral called to [facility], [facility] liason.   Anticipated DC Date:  02/08/2014   Anticipated DC Plan:  [place]  CM consult      PAC Choice  [place]   Choice offered to / List presented to:  C-1 Patient   DME arranged  [place]  3-N-1      DME agency  [place] arranged  [place].   Status of service:  Completed, signed off Medicare Important Message given?   (If response is "NO", the following Medicare IM given date fields will be blank) Date Medicare IM given:   Date Additional Medicare IM given:    Discharge Disposition:  [place]  Per UR Regulation:

## 2014-02-08 NOTE — Progress Notes (Signed)
Subjective: 2 Days Post-Op Procedure(s) (LRB): COMPUTER ASSISTED TOTAL KNEE ARTHROPLASTY (Left) Patient reports pain as moderate.  Has been severe since surgery,but improving today.   "block didn't work at [employer] in hallway today.  No BM, feel bloated.  Will try laxative tonight.  Objective: Vital signs in last 24 hours: Temp:  [98 F (36.7 C)-100 F (37.8 C)] 98 F (36.7 C) (05/15 0521) Pulse Rate:  [82-105] 105 (05/15 0521) Resp:  [16-18] 16 (05/15 0521) BP: (129-144)/(81-84) 129/81 mmHg (05/15 0521) SpO2:  [98 %-100 %] 98 % (05/15 0521)  Intake/Output from previous day: 05/14 0701 - 05/15 0700 In: 1640 [P.O.:720; I.V.:920] Out: 1575 [Urine:1575] Intake/Output this shift: Total I/O In: -  Out: 400 [Urine:400]   Recent Labs  02/07/14 0545 02/08/14 0434  HGB 11.6* 11.5*    Recent Labs  02/07/14 0545 02/08/14 0434  WBC 8.9 10.0  RBC 3.90* 3.79*  HCT 34.7* 34.1*  PLT 228 212    Recent Labs  02/07/14 0545  NA 142  K 4.1  CL 106  CO2 24  BUN 16  CREATININE 1.09  GLUCOSE 95  CALCIUM 8.5   No results found for this basename: LABPT, INR,  in the last 72 hours  Neurovascular intact Sensation intact distally Intact pulses distally Dorsiflexion/Plantar flexion intact Incision: no drainage  Assessment/Plan: 2 Days Post-Op Procedure(s) (LRB): COMPUTER ASSISTED TOTAL KNEE ARTHROPLASTY (Left) Up with therapy Plan for discharge tomorrow if improving with pain control and activity.  Epimenio Foot 02/08/2014, 10:41 AM

## 2014-02-08 NOTE — Progress Notes (Signed)
Physical Therapy Treatment Patient Details Name: Taylor Schroeder MRN: [id number] DOB: 1966/09/01 Today's Date: 02/08/2014    History of Present Illness 48 y.o. male s/p left TKA. History of degenerative disc disease and cardiac catheterization.    PT Comments    Pt continues to progress slowly towards physical therapy goals. Increased ambulatory distance to only 50 feet this afternoon with additional shorter bout; he complains of arm fatigue however, he reports beginning to bear more weight through operated extremity. Completed stair training with wife present and observing. He will benefit from one additional bout of stair training, however physically his knee is showing good stability with no instances of buckling.   Follow Up Recommendations  Home health PT;Supervision/Assistance - 24 hour     Equipment Recommendations  Rolling walker with 5" wheels;3in1 (PT)    Recommendations for Other Services OT consult     Precautions / Restrictions Precautions Precautions: Knee Precaution Booklet Issued: Yes (comment) Precaution Comments: reviewed handout for precautions and exercises Required Braces or Orthoses: Knee Immobilizer - Left Restrictions Weight Bearing Restrictions: Yes LLE Weight Bearing: Weight bearing as tolerated    Mobility  Bed Mobility Overal bed mobility: Needs Assistance Bed Mobility: Supine to Sit     Supine to sit: Supervision     General bed mobility comments: Supervision for safety with cues for technique. Pt requires extra time, but did not need physical assist. HOB flat  Transfers Overall transfer level: Needs assistance Equipment used: Rolling walker (2 wheeled) Transfers: Sit to/from Stand Sit to Stand: Supervision         General transfer comment: supervision for safety with sit<>stand from lowest bed setting. Correctly places hands without cues. relies heavily on RW. Cues for upright posture when assuming standing position. Performed x2 from  recliner with good control and no loss of balance  Ambulation/Gait Ambulation/Gait assistance: Min guard Ambulation Distance (Feet): 50 Feet (additional bout of 30 feet) Assistive device: Rolling walker (2 wheeled) Gait Pattern/deviations: Step-to pattern;Step-through pattern;Decreased step length - right;Decreased stance time - left;Antalgic Gait velocity: slowed   General Gait Details: min guard for safety. appears and states that he is able to bear more weight through LLE this afternoon compared to previous therapy sessions. cues for forward gaze and to extend left knee in left stance phase with quad activation   Stairs Stairs: Yes Stairs assistance: Min assist Stair Management: One rail Left;Step to pattern;Sideways Number of Stairs: 2 (x2) General stair comments: Stair training with demonstration for technique. Verbal and tactile cues for sequencing with left quad activation. No instances of buckling. Cues for upright posture and to prevent leaning heavily over rail. Pt states he feels more confident in the task but reports he does not yet "trust" his knee.  Wheelchair Mobility    Modified Rankin (Stroke Patients Only)       Balance                                    Cognition Arousal/Alertness: Awake/alert Behavior During Therapy: WFL for tasks assessed/performed Overall Cognitive Status: Within Functional Limits for tasks assessed                      Exercises Total Joint Exercises Ankle Circles/Pumps: AROM;Both;10 reps;Seated Quad Sets: AROM;Left;Supine;10 reps Knee Flexion: AAROM;Left;5 reps;Seated Goniometric ROM: 6-69 degrees active ROM left knee flexion    General Comments General comments (skin integrity, edema, etc.): reviewed  precautions and importance of zero knee. States he is unable to tolerate zero knee longer than 35 minutes at a time      Pertinent Vitals/Pain 5/10 pain - pt states his pain is decreasing as nurse has recently  provided him with pain medication Patient repositioned in chair for comfort. 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 PT Goal Formulation: With patient Time For Goal Achievement: 02/14/14 Potential to Achieve Goals: Good Progress towards PT goals: Progressing toward goals    Frequency  7X/week    PT Plan Current plan remains appropriate    Co-evaluation             End of Session Equipment Utilized During Treatment: Left knee immobilizer;Gait belt Activity Tolerance: Patient limited by pain Patient left: with call bell/phone within reach;in chair;with family/visitor present     Time: 2536-6440 PT Time Calculation (min): 37 min  Charges:  $Gait Training: 8-22 mins $Therapeutic Exercise: 8-22 mins                    G Codes:      [place], [place]  Ellouise Newer 02/08/2014, 5:27 PM

## 2014-02-08 NOTE — Progress Notes (Signed)
Physical Therapy Treatment Patient Details Name: Taylor Schroeder MRN: [id number] DOB: Feb 09, 1966 Today's Date: 02/08/2014    History of Present Illness 48 y.o. male s/p left TKA. History of degenerative disc disease and cardiac catheterization.    PT Comments    Pt progressing slowly towards physical therapy goals, ambulating up to 35 feet this AM, limited by pain and fatigue, however he states his pain has improved since yesterday. He complains of his left lower extremity numbness and weakness. Improved knee stability with knee immobilizer during ambulation with no evidence of buckling. Stair training scheduled for afternoon therapy session. Patient will continue to benefit from skilled physical therapy services to further improve independence with functional mobility.   Follow Up Recommendations  Home health PT;Supervision/Assistance - 24 hour     Equipment Recommendations  Rolling walker with 5" wheels;3in1 (PT)    Recommendations for Other Services OT consult     Precautions / Restrictions Precautions Precautions: Knee Precaution Booklet Issued: Yes (comment) Precaution Comments: reviewed handout for precautions and exercises Required Braces or Orthoses: Knee Immobilizer - Left Restrictions Weight Bearing Restrictions: Yes LLE Weight Bearing: Weight bearing as tolerated    Mobility  Bed Mobility Overal bed mobility: Needs Assistance Bed Mobility: Supine to Sit     Supine to sit: Supervision;HOB elevated     General bed mobility comments: Supervision for safety with cues for technique. Pt requires extra time, but did not need physical assist. HOB flat   Transfers Overall transfer level: Needs assistance Equipment used: Rolling walker (2 wheeled) Transfers: Sit to/from Stand Sit to Stand: Supervision         General transfer comment: supervision for safety with sit<>stand from lowest bed setting. Correctly places hands without cues. relies heavily on RW. Cues  for uprigth posure once standing  Ambulation/Gait Ambulation/Gait assistance: Min guard Ambulation Distance (Feet): 35 Feet Assistive device: Rolling walker (2 wheeled) Gait Pattern/deviations: Step-to pattern;Step-through pattern;Decreased step length - right;Decreased stance time - left;Antalgic;Narrow base of support Gait velocity: slowed   General Gait Details: min guard for safety. Improving distance slowly. Pt required to sit at 35 feet secondary to pain and fatigue. Good knee stability but pt not bearing a lot of weight through LLE yet. Knee immobilizer preventing any buckling. Cues for forward gaze and to increase right step length for step-through gait pattern   Stairs            Wheelchair Mobility    Modified Rankin (Stroke Patients Only)       Balance                                    Cognition Arousal/Alertness: Awake/alert Behavior During Therapy: WFL for tasks assessed/performed Overall Cognitive Status: Within Functional Limits for tasks assessed                      Exercises Total Joint Exercises Ankle Circles/Pumps: AROM;Both;10 reps;Seated Quad Sets: AROM;Left;Supine;10 reps Short Arc QuadSinclair Ship;Left;10 reps;Supine Straight Leg Raises: AAROM;Left;10 reps;Supine    General Comments General comments (skin integrity, edema, etc.): Pt complains of numbness in LLE extending from anterior quadriceps to below knee. Reviewed therapeutic exercises and safety with mobility. Discussed importance of using zero knee when not in CPM.      Pertinent Vitals/Pain 5/10 pain - states he will call nurse if his pain increases Patient repositioned in chair for comfort.     Home  Living                      Prior Function            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 PT Goal Formulation: With patient Time For  Goal Achievement: 02/14/14 Potential to Achieve Goals: Good Progress towards PT goals: Progressing toward goals    Frequency  7X/week    PT Plan Current plan remains appropriate    Co-evaluation             End of Session Equipment Utilized During Treatment: Left knee immobilizer Activity Tolerance: Patient limited by pain Patient left: with call bell/phone within reach;in chair     Time: 2355-7322 PT Time Calculation (min): 24 min  Charges:  $Gait Training: 8-22 mins $Therapeutic Exercise: 8-22 mins                    G Codes:      [employer], [place]  Ellouise Newer 02/08/2014, 10:43 AM

## 2014-02-08 NOTE — Progress Notes (Signed)
Patient refused zero knee foam.

## 2014-02-09 LAB — CBC
HCT: 33.7 % — ABNORMAL LOW (ref 39.0–52.0)
HEMOGLOBIN: 11.4 g/dL — AB (ref 13.0–17.0)
MCH: 30 pg (ref 26.0–34.0)
MCHC: 33.8 g/dL (ref 30.0–36.0)
MCV: 88.7 fL (ref 78.0–100.0)
PLATELETS: 230 10*3/uL (ref 150–400)
RBC: 3.8 MIL/uL — ABNORMAL LOW (ref 4.22–5.81)
RDW: 13.3 % (ref 11.5–15.5)
WBC: 9.3 10*3/uL (ref 4.0–10.5)

## 2014-02-09 NOTE — Care Management Note (Signed)
    Page 1 of 2   02/09/2014     12:28:43 PM CARE MANAGEMENT NOTE 02/09/2014  Patient:  Taylor Schroeder, Taylor Schroeder   Account Number:  [id number]  Date Initiated:  02/08/2014  Documentation initiated by:  Ricki Miller  Subjective/Objective Assessment:   48 yr old male s/p left total knee arthroplasty.     Action/Plan:   Case manager spoke with patient concerning home health and DME needs at discharge.Choice offered. Referral called to [facility], [facility] liason.   Anticipated DC Date:  02/08/2014   Anticipated DC Plan:  [place]  CM consult      PAC Choice  [place]   Choice offered to / List presented to:  C-1 Patient   DME arranged  [place]  3-N-1      DME agency  [place] arranged  [place].   Status of service:  Completed, signed off Medicare Important Message given?   (If response is "NO", the following Medicare IM given date fields will be blank) Date Medicare IM given:   Date Additional Medicare IM given:    Discharge Disposition:  [place]  Per UR Regulation:    If discussed at Long Length of Stay Meetings, dates discussed:    Comments:  02/09/14 12:25 CM notified [facility] rep, Kristen of pt discharge. No other CM needs were comunicated.  Mariane Masters, BSN, CM [phone].

## 2014-02-09 NOTE — Progress Notes (Signed)
Subjective: Patient slow to mobilize and reports significant left knee pain and stiffness t   Objective: Vital signs in last 24 hours: Temp:  [97.9 F (36.6 C)-98.7 F (37.1 C)] 97.9 F (36.6 C) (05/16 0541) Pulse Rate:  [96-112] 96 (05/16 0541) Resp:  [16-18] 16 (05/16 0541) BP: (122-139)/(62-90) 131/90 mmHg (05/16 0541) SpO2:  [96 %-100 %] 100 % (05/16 0541) Weight:  [167.196 kg (368 lb 9.6 oz)] 167.196 kg (368 lb 9.6 oz) (05/15 1630)  Intake/Output from previous day: 05/15 0701 - 05/16 0700 In: 1200 [P.O.:1200] Out: 2150 [Urine:2150] Intake/Output this shift: Total I/O In: 600 [P.O.:600] Out: -   Exam:  Dorsiflexion/Plantar flexion intact  Labs:  Recent Labs  02/07/14 0545 02/08/14 0434 02/09/14 0620  HGB 11.6* 11.5* 11.4*    Recent Labs  02/08/14 0434 02/09/14 0620  WBC 10.0 9.3  RBC 3.79* 3.80*  HCT 34.1* 33.7*  PLT 212 230    Recent Labs  02/07/14 0545  NA 142  K 4.1  CL 106  CO2 24  BUN 16  CREATININE 1.09  GLUCOSE 95  CALCIUM 8.5   No results found for this basename: LABPT, INR,  in the last 72 hours  Assessment/Plan: Impression is that the patient's making somewhat slow progress following his total knee replacement. He would be better off staying today for therapy CPM and mobilization with likely discharge tomorrow   Meredith Pel 02/09/2014, 9:56 AM

## 2014-02-09 NOTE — Progress Notes (Signed)
Physical Therapy Treatment Patient Details Name: Taylor Schroeder MRN: [id number] DOB: September 21, 1966 Today's Date: 2014-03-07    History of Present Illness 48 y.o. male s/p left TKA. History of degenerative disc disease and cardiac catheterization.    PT Comments    "I am ready to go home" per patients after entering room. Pt safe at current mobility level to discharge home with prn family assistance/supervision.  Follow Up Recommendations  Home health PT;Supervision/Assistance - 24 hour     Equipment Recommendations  Rolling walker with 5" wheels;3in1 (PT)    Recommendations for Other Services OT consult     Precautions / Restrictions Precautions Precautions: Knee Precaution Comments: re-educated on knee precautions Required Braces or Orthoses: Knee Immobilizer - Left Knee Immobilizer - Left: On when out of bed or walking Restrictions LLE Weight Bearing: Weight bearing as tolerated    Cognition Arousal/Alertness: Awake/alert Behavior During Therapy: WFL for tasks assessed/performed Overall Cognitive Status: Within Functional Limits for tasks assessed           Exercises Total Joint Exercises Ankle Circles/Pumps: AROM;Both;10 reps;Supine Quad Sets: AROM;Strengthening;Left;10 reps;Supine Short Arc Quad: AAROM;Strengthening;Left;10 reps;Supine Heel Slides: AAROM;Strengthening;Left;10 reps;Supine Straight Leg Raises: AAROM;Strengthening;Left;10 reps;Supine Goniometric ROM: seated in recliner (legs up): 65 degrees AROM flexion        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 PT Goal Formulation: With patient Time For Goal Achievement: 02/14/14 Potential to Achieve Goals: Good Progress towards PT goals: Progressing toward goals    Frequency  7X/week    PT Plan Current plan remains appropriate    End of Session Equipment Utilized During Treatment:  Gait belt;Left knee immobilizer Activity Tolerance: Patient tolerated treatment well Patient left: in bed;with family/visitor present;with call bell/phone within reach     Time: 1638-4536 PT Time Calculation (min): 15 min  Charges:  $Gait Training: 8-22 mins $Therapeutic Exercise: 8-22 mins                    G Codes:      Willow Ora 03/07/2014, 4:17 PM  Willow Ora, PTA Office- [phone]

## 2014-02-09 NOTE — Progress Notes (Signed)
Physical Therapy Treatment Patient Details Name: Taylor Schroeder MRN: [id number] DOB: [birth year] Today's Date: 02-28-14    History of Present Illness 48 y.o. male s/p left TKA. History of degenerative disc disease and cardiac catheterization.    PT Comments    Pt making great progress with mobility today.  Follow Up Recommendations  Home health PT;Supervision/Assistance - 24 hour     Equipment Recommendations  Rolling walker with 5" wheels;3in1 (PT)    Recommendations for Other Services OT consult     Precautions / Restrictions Precautions Precautions: Knee Precaution Comments: re-educated on knee precautions Required Braces or Orthoses: Knee Immobilizer - Left Knee Immobilizer - Left: On when out of bed or walking Restrictions LLE Weight Bearing: Weight bearing as tolerated    Mobility  Bed Mobility               General bed mobility comments: Pt out of bed in bathroom on arrival finishing bath with NT and to recliner after session.  Transfers Overall transfer level: Needs assistance Equipment used: Rolling walker (2 wheeled) Transfers: Sit to/from Stand Sit to Stand: Supervision         General transfer comment: demo'd safe technique with transfers  Ambulation/Gait Ambulation/Gait assistance: Min guard;Supervision Ambulation Distance (Feet): 300 Feet Assistive device: Rolling walker (2 wheeled) Gait Pattern/deviations: Step-through pattern;Antalgic Gait velocity: decreased Gait velocity interpretation: Below normal speed for age/gender General Gait Details: occasional cues on posture, walker position with gait and for decr'd UE reliance on walker.   Stairs   Stairs assistance: Supervision Stair Management: Two rails;Step to pattern;Forwards Number of Stairs: 5 General stair comments: pt reportes he can reach both rails at home easily. min cues on use of both rails. no instability or knee buckling noted with stairs.      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 PT Goal Formulation: With patient Time For Goal Achievement: 02/14/14 Potential to Achieve Goals: Good Progress towards PT goals: Progressing toward goals    Frequency  7X/week    PT Plan Current plan remains appropriate    End of Session Equipment Utilized During Treatment: Gait belt;Left knee immobilizer Activity Tolerance: Patient tolerated treatment well Patient left: in chair;with call bell/phone within reach     Time: 0927-0957 PT Time Calculation (min): 30 min  Charges:  $Gait Training: 8-22 mins $Therapeutic Exercise: 8-22 mins                    G Codes:      Willow Ora 2014-02-28, 1:42 PM  Willow Ora, PTA Office- [phone]

## 2014-02-20 ENCOUNTER — Ambulatory Visit
Admission: RE | Admit: 2014-02-20 | Discharge: 2014-02-20 | Disposition: A | Discharge status: Home / Self Care | Payer: 59 | Source: Ambulatory Visit | Attending: Orthopaedic Surgery | Admitting: Orthopaedic Surgery

## 2014-02-20 ENCOUNTER — Other Ambulatory Visit: Payer: Self-pay | Admitting: Orthopaedic Surgery

## 2014-02-20 DIAGNOSIS — R609 Edema, unspecified: Secondary | ICD-10-CM

## 2014-02-27 ENCOUNTER — Emergency Department (HOSPITAL_COMMUNITY): Payer: 59

## 2014-02-27 ENCOUNTER — Encounter (HOSPITAL_COMMUNITY): Payer: Self-pay | Admitting: Emergency Medicine

## 2014-02-27 ENCOUNTER — Emergency Department (HOSPITAL_COMMUNITY)
Admission: EM | Admit: 2014-02-27 | Discharge: 2014-02-27 | Disposition: A | Discharge status: Home / Self Care | Payer: 59 | Attending: Emergency Medicine | Admitting: Emergency Medicine

## 2014-02-27 DIAGNOSIS — E669 Obesity, unspecified: Secondary | ICD-10-CM | POA: Insufficient documentation

## 2014-02-27 DIAGNOSIS — Z862 Personal history of diseases of the blood and blood-forming organs and certain disorders involving the immune mechanism: Secondary | ICD-10-CM | POA: Insufficient documentation

## 2014-02-27 DIAGNOSIS — I2510 Atherosclerotic heart disease of native coronary artery without angina pectoris: Secondary | ICD-10-CM | POA: Insufficient documentation

## 2014-02-27 DIAGNOSIS — K029 Dental caries, unspecified: Secondary | ICD-10-CM | POA: Insufficient documentation

## 2014-02-27 DIAGNOSIS — M129 Arthropathy, unspecified: Secondary | ICD-10-CM | POA: Insufficient documentation

## 2014-02-27 DIAGNOSIS — K047 Periapical abscess without sinus: Secondary | ICD-10-CM

## 2014-02-27 DIAGNOSIS — H9209 Otalgia, unspecified ear: Secondary | ICD-10-CM | POA: Insufficient documentation

## 2014-02-27 DIAGNOSIS — Z9889 Other specified postprocedural states: Secondary | ICD-10-CM | POA: Insufficient documentation

## 2014-02-27 DIAGNOSIS — J45909 Unspecified asthma, uncomplicated: Secondary | ICD-10-CM | POA: Insufficient documentation

## 2014-02-27 DIAGNOSIS — Z7982 Long term (current) use of aspirin: Secondary | ICD-10-CM | POA: Insufficient documentation

## 2014-02-27 DIAGNOSIS — Z87448 Personal history of other diseases of urinary system: Secondary | ICD-10-CM | POA: Insufficient documentation

## 2014-02-27 DIAGNOSIS — Z79899 Other long term (current) drug therapy: Secondary | ICD-10-CM | POA: Insufficient documentation

## 2014-02-27 DIAGNOSIS — K219 Gastro-esophageal reflux disease without esophagitis: Secondary | ICD-10-CM | POA: Insufficient documentation

## 2014-02-27 DIAGNOSIS — Z87891 Personal history of nicotine dependence: Secondary | ICD-10-CM | POA: Insufficient documentation

## 2014-02-27 DIAGNOSIS — R6884 Jaw pain: Secondary | ICD-10-CM

## 2014-02-27 LAB — I-STAT CHEM 8, ED
BUN: 9 mg/dL (ref 6–23)
Calcium, Ion: 1.17 mmol/L (ref 1.12–1.23)
Chloride: 99 mEq/L (ref 96–112)
Creatinine, Ser: 0.9 mg/dL (ref 0.50–1.35)
Glucose, Bld: 99 mg/dL (ref 70–99)
HCT: 39 % (ref 39.0–52.0)
Hemoglobin: 13.3 g/dL (ref 13.0–17.0)
Potassium: 4.2 mEq/L (ref 3.7–5.3)
Sodium: 138 mEq/L (ref 137–147)
TCO2: 27 mmol/L (ref 0–100)

## 2014-02-27 MED ORDER — ONDANSETRON HCL 4 MG/2ML IJ SOLN
4.0000 mg | Freq: Once | INTRAMUSCULAR | Status: AC
  Administered 2014-02-27: 4 mg via INTRAVENOUS
  Filled 2014-02-27: qty 2

## 2014-02-27 MED ORDER — IOHEXOL 300 MG/ML  SOLN
85.0000 mL | Freq: Once | INTRAMUSCULAR | Status: AC | PRN
  Administered 2014-02-27: 75 mL via INTRAVENOUS

## 2014-02-27 MED ORDER — CLINDAMYCIN HCL 300 MG PO CAPS: 300.0000 mg | ORAL_CAPSULE | Freq: Four times a day (QID) | ORAL | Status: DC

## 2014-02-27 MED ORDER — HYDROMORPHONE HCL PF 1 MG/ML IJ SOLN
1.0000 mg | Freq: Once | INTRAMUSCULAR | Status: AC
  Administered 2014-02-27: 1 mg via INTRAVENOUS
  Filled 2014-02-27: qty 1

## 2014-02-27 MED ORDER — OXYCODONE-ACETAMINOPHEN 5-325 MG PO TABS: 1.0000 | ORAL_TABLET | ORAL | Status: DC | PRN

## 2014-02-27 MED ORDER — KETOROLAC TROMETHAMINE 30 MG/ML IJ SOLN
30.0000 mg | Freq: Once | INTRAMUSCULAR | Status: AC
  Administered 2014-02-27: 30 mg via INTRAVENOUS
  Filled 2014-02-27: qty 1

## 2014-02-27 NOTE — Discharge Instructions (Signed)
Abscessed Tooth  An abscessed tooth is an infection around your tooth. It may be caused by holes or damage to the tooth (cavity) or a dental disease. An abscessed tooth causes mild to very bad pain in and around the tooth. See your dentist right away if you have tooth or gum pain.  HOME CARE   Take your medicine as told. Finish it even if you start to feel better.   Do not drive after taking pain medicine.   Rinse your mouth (gargle) often with salt water ( teaspoon salt in 8 ounces of warm water).   Do not apply heat to the outside of your face.  GET HELP RIGHT AWAY IF:    You have a temperature by mouth above 102 F (38.9 C), not controlled by medicine.   You have chills and a very bad headache.   You have problems breathing or swallowing.   Your mouth will not open.   You develop puffiness (swelling) on the neck or around the eye.   Your pain is not helped by medicine.   Your pain is getting worse instead of better.  MAKE SURE YOU:    Understand these instructions.   Will watch your condition.   Will get help right away if you are not doing well or get worse.  Document Released: 03/01/2008 Document Revised: 12/06/2011 Document Reviewed: 12/22/2010  [employer] Patient Information 2014 [employer].

## 2014-02-27 NOTE — ED Provider Notes (Signed)
CSN: [id number]     Arrival date & time 02/27/14  1002 History   First MD Initiated Contact with Patient 02/27/14 1123     Chief Complaint  Patient presents with  . Jaw Pain     (Consider location/radiation/quality/duration/timing/severity/associated sxs/prior Treatment) HPI Comments: ZACHARIA SOWLES is a 48 y.o. male who presents to the Emergency Department complaining of left jaw pain for 2 days. He describes the pain as a stabbing sharp pain to the left jaw and up to his left ear and neck.  Patient had total knee replacement on 02/06/2014 he is currently doing well with his knee pain and he takes one aspirin daily since his surgery. He states that he noticed swelling to his left jaw and was concerned that it may be from a dental infection. He states that he began taking amoxicillin that he had left from a previous infection has been taking that for 3 days. He describes pain with chewing and difficulty opening his mouth. He denies fever, chills, chest pain, shortness of breath or  pain with neck movement.  he has been taking oxycodone for his knee pain, but states it is not been helping  with the jaw pain.  The history is provided by the patient.    Past Medical History  Diagnosis Date  . Arthritis   . GERD (gastroesophageal reflux disease)   . Coronary artery disease Non-obstructive    a. nonobs cath in 2006;  b. 01/2013 Cath: LM nl, LAD 60m, LCX nl, RCA non-dom, nl. EF 60-65%.  . Obesity   . BPH (benign prostatic hyperplasia)   . Anemia   . Pernicious anemia-B12 deficiency 04/18/2013  . Crohn's ileitis - working dx 06/01/2013  . Esophageal ring, acquired 07/12/2013  . Asthma     "as a baby"  . H/O hiatal hernia     "think so" (02/06/2014)   Past Surgical History  Procedure Laterality Date  . Appendectomy    . Carpal tunnel release Right   . Shoulder arthroscopy Left 03/12/2013  . Colonoscopy    . Esophagogastroduodenoscopy    . Total knee arthroplasty Left 02/06/2014  . Inguinal  hernia repair  1966    "? side"  . Cardiac catheterization  01/2013  . Knee arthroplasty Left 02/06/2014    Procedure: COMPUTER ASSISTED TOTAL KNEE ARTHROPLASTY;  Surgeon: [name], MD;  Location: [facility];  Service: Orthopedics;  Laterality: Left;  Cemented Left Total Knee Arthroplasty   Family History  Problem Relation Age of Onset  . Hypertension Mother   . Cancer Father     type unknown  . Alcohol abuse Neg Hx   . Diabetes Neg Hx   . Early death Neg Hx   . Heart disease Neg Hx   . Hyperlipidemia Neg Hx   . Kidney disease Neg Hx   . Stroke Neg Hx   . COPD Other   . Emphysema Maternal Grandmother   . Emphysema Maternal Grandfather   . Other Paternal Grandmother     spinal cancer   History  Substance Use Topics  . Smoking status: Former Smoker -- 2.00 packs/day for 30 years    Types: Cigarettes    Quit date: 08/29/2008  . Smokeless tobacco: Never Used  . Alcohol Use: Yes     Comment: 02/06/2014 "couple beers a couple times/month"    Review of Systems  Constitutional: Negative for fever, chills, activity change and appetite change.  HENT: Positive for dental problem, ear pain and facial swelling.  Negative for congestion, drooling, sore throat and trouble swallowing.   Eyes: Negative for pain and visual disturbance.  Respiratory: Negative for chest tightness, shortness of breath and wheezing.   Cardiovascular: Negative for chest pain.  Gastrointestinal: Negative for nausea, vomiting and abdominal pain.  Musculoskeletal: Negative for neck pain and neck stiffness.  Skin: Negative for rash.  Neurological: Negative for dizziness, seizures, syncope, facial asymmetry and headaches.  Hematological: Negative for adenopathy.  All other systems reviewed and are negative.     Allergies  Allegra; Demerol; Fexofenadine; and Meperidine hcl  Home Medications   Prior to Admission medications   Medication Sig Start Date End Date Taking? Authorizing Provider  aspirin EC 325 MG  tablet Take 1 tablet (325 mg total) by mouth daily. 02/06/14  Yes Epimenio Foot, PA-C  methocarbamol (ROBAXIN) 500 MG tablet Take 1 tablet (500 mg total) by mouth every 6 (six) hours as needed for muscle spasms (spasm). 02/06/14  Yes Epimenio Foot, PA-C  omeprazole (PRILOSEC OTC) 20 MG tablet Take 20 mg by mouth daily.    Yes Historical Provider, MD  oxyCODONE-acetaminophen (ROXICET) 5-325 MG per tablet Take 1-2 tablets by mouth every 4 (four) hours as needed. 02/06/14  Yes Epimenio Foot, PA-C   BP 136/99  Pulse 97  Temp(Src) 98 F (36.7 C) (Oral)  Resp 20  Ht 5\' 8"  (1.727 m)  Wt 240 lb (108.863 kg)  BMI 36.50 kg/m2  SpO2 99% Physical Exam  Nursing note and vitals reviewed. Constitutional: He is oriented to person, place, and time. He appears well-developed and well-nourished. No distress.  HENT:  Head: Normocephalic and atraumatic.  Right Ear: Tympanic membrane and ear canal normal.  Left Ear: Tympanic membrane and ear canal normal.  Nose: Nose normal.  Mouth/Throat: Uvula is midline, oropharynx is clear and moist and mucous membranes are normal. No trismus in the jaw. Dental caries present. No dental abscesses or uvula swelling. No oropharyngeal exudate, posterior oropharyngeal edema, posterior oropharyngeal erythema or tonsillar abscesses.    Tenderness to palpation with dental caries of the left third molar. Mild trismus.   Patient also has tenderness of the left mandible. No facial swelling noted, no  obvious dental abscess, or sublingual abnml.  Airway patent  Eyes: Pupils are equal, round, and reactive to light.  Neck: Normal range of motion, full passive range of motion without pain and phonation normal. Neck supple. No thyromegaly present.  Cardiovascular: Normal rate, regular rhythm, normal heart sounds and intact distal pulses.   No murmur heard. Pulmonary/Chest: Effort normal and breath sounds normal. No stridor. No respiratory distress.  Musculoskeletal: Normal range of  motion.  Lymphadenopathy:    He has no cervical adenopathy.  Neurological: He is alert and oriented to person, place, and time. He exhibits normal muscle tone. Coordination normal.  Skin: Skin is warm and dry.    ED Course  Procedures (including critical care time) Labs Review Labs Reviewed  I-STAT CHEM 8, ED    Imaging Review Ct Soft Tissue Neck W Contrast  02/27/2014   CLINICAL DATA:  Severe left gel pain.  EXAM: CT NECK WITH CONTRAST  TECHNIQUE: Multidetector CT imaging of the neck was performed using the standard protocol following the bolus administration of intravenous contrast.  CONTRAST:  55mL OMNIPAQUE IOHEXOL 300 MG/ML  SOLN  COMPARISON:  None.  FINDINGS: Examination of the mandible demonstrates no periapical abscess involving the left or right mandible. There is a periapical abscess involving the of most posterior right tooth  the maxilla as well as the fourth from back tooth on the right on the same side (these findings are best seen on sagittal image 29). .  The submandibular glands are normal evidence of acute inflammation. Parotid glands are normal without evidence of inflammation. No salivary gland stone is identified.  No evidence of fluid collection of the left right neck. There scattered sub cm cervical lymph nodes.  Limited view of the inferior brain is normal. The orbits are normal. The intraconal contents are normal.  There is no fluid in the paranasal sinuses.  Frontal sinuses clear.  The hypopharynx and glottis is normal.  The the thyroid gland is normal.  Lung apices are clear.  IMPRESSION: 1. No evidence of periapical abscess involving the mandible. 2. No salivary gland abnormality evident. 3. Periapical abscess involving the posterior right upper teeth.   Electronically Signed   By: Suzy Bouchard M.D.   On: 02/27/2014 13:40     EKG Interpretation None      Vital signs are stable. Patient is well-appearing and nontoxic. He does appear uncomfortable. Plan at this  time will be IV medications and CT of the soft tissue neck.  MDM   Final diagnoses:  Jaw pain  Periapical abscess    Pt is feeling better after medications.  Pain improved.  I have discussed CT results with the patient.  Findings are suggestive of periapical abscess to right upper teeth, but patient sx's are lower left teeth and jaw.  Pt does have ttp of the left lower jaw line and lower teeth.  Clinical suspicion for ACS is low. No CT findings to suggest Ludwig's angina.  Airway is patent.  Pt is non-toxic appearing.  He agrees to close dental f/u and Rx given for clindamycin and percocet.  Pt appears stable for d/c    Aldridge Krzyzanowski L. Vanessa Garfield, PA-C 02/28/14 2133

## 2014-02-27 NOTE — ED Notes (Signed)
Patient transported to CT 

## 2014-02-27 NOTE — ED Notes (Signed)
Reports left knee surgery on 5/13 and now having left jaw pain x 2 days. Pain from left ear into left shoulder, swelling noted to jaw. Airway intact.

## 2014-02-27 NOTE — ED Notes (Signed)
Patient returned from CT

## 2014-03-03 NOTE — ED Provider Notes (Signed)
Medical screening examination/treatment/procedure(s) were performed by non-physician practitioner and as supervising physician I was immediately available for consultation/collaboration.   EKG Interpretation   Date/Time:  Wednesday February 27 2014 10:07:19 EDT Ventricular Rate:  98 PR Interval:  180 QRS Duration: 70 QT Interval:  324 QTC Calculation: 413 R Axis:   28 Text Interpretation:  Normal sinus rhythm Low voltage QRS Septal infarct ,  age undetermined Abnormal ECG ED PHYSICIAN INTERPRETATION AVAILABLE IN  [facility] Confirmed by TEST, Record ([id number]) on 03/01/2014 7:17:51 AM        Fredia Sorrow, MD 03/03/14 1144

## 2014-03-06 NOTE — Discharge Summary (Signed)
Physician Discharge Summary  Patient ID: Taylor Schroeder MRN: [id number] DOB/AGE: [birth year] 48 y.o.  Admit date: 02/06/2014 Discharge date: 02/09/2014  Admission Diagnoses:  Osteoarthritis of left knee  Discharge Diagnoses:  Principal Problem:   Osteoarthritis of left knee   Past Medical History  Diagnosis Date  . Arthritis   . GERD (gastroesophageal reflux disease)   . Coronary artery disease Non-obstructive    a. nonobs cath in 2006;  b. 01/2013 Cath: LM nl, LAD 55m, LCX nl, RCA non-dom, nl. EF 60-65%.  . Obesity   . BPH (benign prostatic hyperplasia)   . Anemia   . Pernicious anemia-B12 deficiency 04/18/2013  . Crohn's ileitis - working dx 06/01/2013  . Esophageal ring, acquired 07/12/2013  . Asthma     "as a baby"  . H/O hiatal hernia     "think so" (02/06/2014)    Surgeries: Procedure(s): COMPUTER ASSISTED TOTAL KNEE ARTHROPLASTY on 02/06/2014   Consultants (if any):    Discharged Condition: Improved  Hospital Course: Taylor Schroeder is an 48 y.o. male who was admitted 02/06/2014 with a diagnosis of Osteoarthritis of left knee and went to the operating room on 02/06/2014 and underwent the above named procedures.    He was given perioperative antibiotics:      Anti-infectives   Start     Dose/Rate Route Frequency Ordered Stop   02/06/14 0600  ceFAZolin (ANCEF) IVPB 2 g/50 mL premix     2 g 100 mL/hr over 30 Minutes Intravenous On call to O.R. 02/05/14 1411 02/06/14 1343    .  He was given sequential compression devices, early ambulation, and aspirin for DVT prophylaxis.  He benefited maximally from the hospital stay and there were no complications.    Recent vital signs:  Filed Vitals:   02/09/14 1500  BP: 138/80  Pulse: 100  Temp: 98.1 F (36.7 C)  Resp: 18    Recent laboratory studies:  Lab Results  Component Value Date   HGB 13.3 02/27/2014   HGB 11.4* 02/09/2014   HGB 11.5* 02/08/2014   Lab Results  Component Value Date   WBC 9.3 02/09/2014    PLT 230 02/09/2014   Lab Results  Component Value Date   INR 0.94 01/30/2014   Lab Results  Component Value Date   NA 138 02/27/2014   K 4.2 02/27/2014   CL 99 02/27/2014   CO2 24 02/07/2014   BUN 9 02/27/2014   CREATININE 0.90 02/27/2014   GLUCOSE 99 02/27/2014    Discharge Medications:     Medication List         aspirin EC 325 MG tablet  Take 1 tablet (325 mg total) by mouth daily.     methocarbamol 500 MG tablet  Commonly known as:  ROBAXIN  Take 1 tablet (500 mg total) by mouth every 6 (six) hours as needed for muscle spasms (spasm).     omeprazole 20 MG tablet  Commonly known as:  PRILOSEC OTC  Take 20 mg by mouth daily.     oxyCODONE-acetaminophen 5-325 MG per tablet  Commonly known as:  ROXICET  Take 1-2 tablets by mouth every 4 (four) hours as needed.        Diagnostic Studies: Ct Soft Tissue Neck W Contrast  02/27/2014   CLINICAL DATA:  Severe left gel pain.  EXAM: CT NECK WITH CONTRAST  TECHNIQUE: Multidetector CT imaging of the neck was performed using the standard protocol following the bolus administration of intravenous contrast.  CONTRAST:  51mL  OMNIPAQUE IOHEXOL 300 MG/ML  SOLN  COMPARISON:  None.  FINDINGS: Examination of the mandible demonstrates no periapical abscess involving the left or right mandible. There is a periapical abscess involving the of most posterior right tooth the maxilla as well as the fourth from back tooth on the right on the same side (these findings are best seen on sagittal image 29). .  The submandibular glands are normal evidence of acute inflammation. Parotid glands are normal without evidence of inflammation. No salivary gland stone is identified.  No evidence of fluid collection of the left right neck. There scattered sub cm cervical lymph nodes.  Limited view of the inferior brain is normal. The orbits are normal. The intraconal contents are normal.  There is no fluid in the paranasal sinuses.  Frontal sinuses clear.  The hypopharynx and  glottis is normal.  The the thyroid gland is normal.  Lung apices are clear.  IMPRESSION: 1. No evidence of periapical abscess involving the mandible. 2. No salivary gland abnormality evident. 3. Periapical abscess involving the posterior right upper teeth.   Electronically Signed   By: Suzy Bouchard M.D.   On: 02/27/2014 13:40   US Venous Img Lower Unilateral Left  02/20/2014   CLINICAL DATA:  Patient complains of pain and edema.  Rule out DVT.  EXAM: LEFT LOWER EXTREMITY VENOUS DOPPLER ULTRASOUND  TECHNIQUE: Gray-scale sonography with graded compression, as well as color Doppler and duplex ultrasound, were performed to evaluate the deep venous system from the level of the common femoral vein through the popliteal and proximal calf veins. Spectral Doppler was utilized to evaluate flow at rest and with distal augmentation maneuvers.  COMPARISON:  None.  FINDINGS: Normal compressibility of the left common femoral vein, left femoral vein and left popliteal vein. Visualized deep calf veins are patent. The left great saphenous vein is compressible and patent.  IMPRESSION: Negative for left lower extremity DVT.   Electronically Signed   By: Markus Daft M.D.   On: 02/20/2014 15:38    Disposition: 01-Home or Self Care  Discharge Instructions   Call MD / Call 911    Complete by:  As directed   If you experience chest pain or shortness of breath, CALL 911 and be transported to the hospital emergency room.  If you develope a fever above 101 F, pus (white drainage) or increased drainage or redness at the wound, or calf pain, call your surgeon's office.     Constipation Prevention    Complete by:  As directed   Drink plenty of fluids.  Prune juice may be helpful.  You may use a stool softener, such as Colace (over the counter) 100 mg twice a day.  Use MiraLax (over the counter) for constipation as needed.     Diet - low sodium heart healthy    Complete by:  As directed      Increase activity slowly as  tolerated    Complete by:  As directed            Follow-up Information   Follow up with [name], MD. Schedule an appointment as soon as possible for a visit in 2 weeks.   Specialty:  Orthopedic Surgery   Contact information:   [place] [address] [phone]       Follow up with [facility]. (Someone from [facility] will contact you concerning start date and time for physical therapy.)    Contact information:  [place] [address] [phone]        Signed: Epimenio Foot 03/06/2014, 3:40 PM

## 2014-03-10 ENCOUNTER — Other Ambulatory Visit: Payer: Self-pay | Admitting: Internal Medicine

## 2014-03-22 NOTE — OR Nursing (Signed)
Addendum to scope page 

## 2014-07-15 ENCOUNTER — Ambulatory Visit (INDEPENDENT_AMBULATORY_CARE_PROVIDER_SITE_OTHER): Payer: 59 | Admitting: Internal Medicine

## 2014-07-15 ENCOUNTER — Encounter: Payer: Self-pay | Admitting: Internal Medicine

## 2014-07-15 ENCOUNTER — Other Ambulatory Visit (INDEPENDENT_AMBULATORY_CARE_PROVIDER_SITE_OTHER): Payer: 59

## 2014-07-15 VITALS — BP 140/96 | HR 93 | Temp 98.1°F | Resp 16 | Ht 68.0 in | Wt 245.0 lb

## 2014-07-15 DIAGNOSIS — Z0000 Encounter for general adult medical examination without abnormal findings: Secondary | ICD-10-CM

## 2014-07-15 DIAGNOSIS — I10 Essential (primary) hypertension: Secondary | ICD-10-CM

## 2014-07-15 DIAGNOSIS — R739 Hyperglycemia, unspecified: Secondary | ICD-10-CM

## 2014-07-15 DIAGNOSIS — Z23 Encounter for immunization: Secondary | ICD-10-CM

## 2014-07-15 DIAGNOSIS — D510 Vitamin B12 deficiency anemia due to intrinsic factor deficiency: Secondary | ICD-10-CM

## 2014-07-15 LAB — LIPID PANEL
CHOLESTEROL: 210 mg/dL — AB (ref 0–200)
HDL: 43.8 mg/dL (ref >39.00)
LDL CALC: 143 mg/dL — AB (ref 0–99)
NonHDL: 166.2
TRIGLYCERIDES: 118 mg/dL (ref 0.0–149.0)
Total CHOL/HDL Ratio: 5
VLDL: 23.6 mg/dL (ref 0.0–40.0)

## 2014-07-15 LAB — COMPREHENSIVE METABOLIC PANEL
ALK PHOS: 68 U/L (ref 39–117)
ALT: 30 U/L (ref 0–53)
AST: 21 U/L (ref 0–37)
Albumin: 3.7 g/dL (ref 3.5–5.2)
BILIRUBIN TOTAL: 0.6 mg/dL (ref 0.2–1.2)
BUN: 14 mg/dL (ref 6–23)
CO2: 20 mEq/L (ref 19–32)
CREATININE: 0.9 mg/dL (ref 0.4–1.5)
Calcium: 9.2 mg/dL (ref 8.4–10.5)
Chloride: 103 mEq/L (ref 96–112)
GFR: 96.47 mL/min (ref >60.00)
Glucose, Bld: 93 mg/dL (ref 70–99)
Potassium: 4.1 mEq/L (ref 3.5–5.1)
Sodium: 139 mEq/L (ref 135–145)
Total Protein: 7.3 g/dL (ref 6.0–8.3)

## 2014-07-15 LAB — CBC WITH DIFFERENTIAL/PLATELET
BASOS ABS: 0 10*3/uL (ref 0.0–0.1)
Basophils Relative: 0.4 % (ref 0.0–3.0)
EOS ABS: 0.1 10*3/uL (ref 0.0–0.7)
Eosinophils Relative: 1.1 % (ref 0.0–5.0)
HCT: 42.4 % (ref 39.0–52.0)
Hemoglobin: 14.2 g/dL (ref 13.0–17.0)
Lymphocytes Relative: 30.2 % (ref 12.0–46.0)
Lymphs Abs: 2.7 10*3/uL (ref 0.7–4.0)
MCHC: 33.5 g/dL (ref 30.0–36.0)
MCV: 87 fl (ref 78.0–100.0)
MONOS PCT: 7.8 % (ref 3.0–12.0)
Monocytes Absolute: 0.7 10*3/uL (ref 0.1–1.0)
NEUTROS ABS: 5.4 10*3/uL (ref 1.4–7.7)
Neutrophils Relative %: 60.5 % (ref 43.0–77.0)
PLATELETS: 299 10*3/uL (ref 150.0–400.0)
RBC: 4.88 Mil/uL (ref 4.22–5.81)
RDW: 14.3 % (ref 11.5–15.5)
WBC: 8.9 10*3/uL (ref 4.0–10.5)

## 2014-07-15 LAB — FOLATE: Folate: 10.5 ng/mL (ref >5.9)

## 2014-07-15 LAB — TSH: TSH: 2.34 u[IU]/mL (ref 0.35–4.50)

## 2014-07-15 LAB — VITAMIN B12: Vitamin B-12: 705 pg/mL (ref 211–911)

## 2014-07-15 LAB — HEMOGLOBIN A1C: HEMOGLOBIN A1C: 5.5 % (ref 4.6–6.5)

## 2014-07-15 LAB — PSA: PSA: 2.1 ng/mL (ref 0.10–4.00)

## 2014-07-15 NOTE — Progress Notes (Signed)
Subjective:    Patient ID: Taylor Schroeder, male    DOB: 09-Aug-1966, 48 y.o.   MRN: [id number]  Hypertension This is a new problem. The current episode started more than 1 year ago. The problem is unchanged. The problem is uncontrolled. Pertinent negatives include no anxiety, blurred vision, chest pain, headaches, malaise/fatigue, neck pain, orthopnea, palpitations, peripheral edema, PND, shortness of breath or sweats. There are no associated agents to hypertension. Risk factors for coronary artery disease include male gender and obesity. Past treatments include nothing. The current treatment provides no improvement. Compliance problems include diet and exercise.       Review of Systems  Constitutional: Negative for fever, chills, malaise/fatigue, diaphoresis, appetite change and fatigue.  HENT: Negative.   Eyes: Negative.  Negative for blurred vision.  Respiratory: Negative.  Negative for cough, choking, chest tightness, shortness of breath, wheezing and stridor.   Cardiovascular: Negative.  Negative for chest pain, palpitations, orthopnea, leg swelling and PND.  Gastrointestinal: Negative.  Negative for nausea, vomiting, abdominal pain, diarrhea, constipation and blood in stool.  Endocrine: Negative.   Genitourinary: Negative.  Negative for difficulty urinating.  Musculoskeletal: Negative.  Negative for arthralgias, back pain and neck pain.  Skin: Negative.  Negative for rash.  Allergic/Immunologic: Negative.   Neurological: Negative.  Negative for dizziness, syncope, speech difficulty, weakness, light-headedness, numbness and headaches.  Hematological: Negative.  Negative for adenopathy. Does not bruise/bleed easily.  Psychiatric/Behavioral: Negative.        Objective:   Physical Exam  Vitals reviewed. Constitutional: He is oriented to person, place, and time. He appears well-developed and well-nourished. No distress.  HENT:  Head: Normocephalic and atraumatic.  Mouth/Throat:  Oropharynx is clear and moist. No oropharyngeal exudate.  Eyes: Conjunctivae are normal. Right eye exhibits no discharge. Left eye exhibits no discharge. No scleral icterus.  Neck: Normal range of motion. Neck supple. No JVD present. No tracheal deviation present. No thyromegaly present.  Cardiovascular: Normal rate, regular rhythm, normal heart sounds and intact distal pulses.  Exam reveals no gallop and no friction rub.   No murmur heard. Pulmonary/Chest: Effort normal and breath sounds normal. No stridor. No respiratory distress. He has no wheezes. He has no rales. He exhibits no tenderness.  Abdominal: Soft. Bowel sounds are normal. He exhibits no distension and no mass. There is no tenderness. There is no rebound and no guarding. Hernia confirmed negative in the right inguinal area and confirmed negative in the left inguinal area.  Genitourinary: Rectum normal, prostate normal, testes normal and penis normal. Rectal exam shows no external hemorrhoid, no internal hemorrhoid, no fissure, no mass, no tenderness and anal tone normal. Guaiac negative stool. Prostate is not enlarged and not tender. Right testis shows no mass, no swelling and no tenderness. Right testis is descended. Left testis shows no mass, no swelling and no tenderness. Left testis is descended. Circumcised. No penile erythema or penile tenderness. No discharge found.  Musculoskeletal: Normal range of motion. He exhibits no edema and no tenderness.  Lymphadenopathy:    He has no cervical adenopathy.       Right: No inguinal adenopathy present.       Left: No inguinal adenopathy present.  Neurological: He is oriented to person, place, and time.  Skin: Skin is warm and dry. No rash noted. He is not diaphoretic. No erythema. No pallor.  Psychiatric: He has a normal mood and affect. His behavior is normal. Judgment and thought content normal.     Lab Results  Component Value Date   WBC 9.3 02/09/2014   HGB 13.3 02/27/2014   HCT  39.0 02/27/2014   PLT 230 02/09/2014   GLUCOSE 99 02/27/2014   CHOL 155 01/28/2013   TRIG 89 01/28/2013   HDL 50 01/28/2013   LDLDIRECT 147.6 08/29/2012   LDLCALC 87 01/28/2013   ALT 27 01/30/2014   AST 20 01/30/2014   NA 138 02/27/2014   K 4.2 02/27/2014   CL 99 02/27/2014   CREATININE 0.90 02/27/2014   BUN 9 02/27/2014   CO2 24 02/07/2014   TSH 2.49 08/29/2012   PSA 1.74 04/18/2013   INR 0.94 01/30/2014   HGBA1C 5.7 08/29/2012       Assessment & Plan:

## 2014-07-15 NOTE — Patient Instructions (Signed)
Health Maintenance A healthy lifestyle and preventative care can promote health and wellness.  Maintain regular health, dental, and eye exams.  Eat a healthy diet. Foods like vegetables, fruits, whole grains, low-fat dairy products, and lean protein foods contain the nutrients you need and are low in calories. Decrease your intake of foods high in solid fats, added sugars, and salt. Get information about a proper diet from your health care provider, if necessary.  Regular physical exercise is one of the most important things you can do for your health. Most adults should get at least 150 minutes of moderate-intensity exercise (any activity that increases your heart rate and causes you to sweat) each week. In addition, most adults need muscle-strengthening exercises on 2 or more days a week.   Maintain a healthy weight. The body mass index (BMI) is a screening tool to identify possible weight problems. It provides an estimate of body fat based on height and weight. Your health care provider can find your BMI and can help you achieve or maintain a healthy weight. For males 20 years and older:  A BMI below 18.5 is considered underweight.  A BMI of 18.5 to 24.9 is normal.  A BMI of 25 to 29.9 is considered overweight.  A BMI of 30 and above is considered obese.  Maintain normal blood lipids and cholesterol by exercising and minimizing your intake of saturated fat. Eat a balanced diet with plenty of fruits and vegetables. Blood tests for lipids and cholesterol should begin at age 20 and be repeated every 5 years. If your lipid or cholesterol levels are high, you are over age 50, or you are at high risk for heart disease, you may need your cholesterol levels checked more frequently.Ongoing high lipid and cholesterol levels should be treated with medicines if diet and exercise are not working.  If you smoke, find out from your health care provider how to quit. If you do not use tobacco, do not  start.  Lung cancer screening is recommended for adults aged 55-80 years who are at high risk for developing lung cancer because of a history of smoking. A yearly low-dose CT scan of the lungs is recommended for people who have at least a 30-pack-year history of smoking and are current smokers or have quit within the past 15 years. A pack year of smoking is smoking an average of 1 pack of cigarettes a day for 1 year (for example, a 30-pack-year history of smoking could mean smoking 1 pack a day for 30 years or 2 packs a day for 15 years). Yearly screening should continue until the smoker has stopped smoking for at least 15 years. Yearly screening should be stopped for people who develop a health problem that would prevent them from having lung cancer treatment.  If you choose to drink alcohol, do not have more than 2 drinks per day. One drink is considered to be 12 oz (360 mL) of beer, 5 oz (150 mL) of wine, or 1.5 oz (45 mL) of liquor.  Avoid the use of street drugs. Do not share needles with anyone. Ask for help if you need support or instructions about stopping the use of drugs.  High blood pressure causes heart disease and increases the risk of stroke. Blood pressure should be checked at least every 1-2 years. Ongoing high blood pressure should be treated with medicines if weight loss and exercise are not effective.  If you are 45-79 years old, ask your health care provider if   you should take aspirin to prevent heart disease.  Diabetes screening involves taking a blood sample to check your fasting blood sugar level. This should be done once every 3 years after age 45 if you are at a normal weight and without risk factors for diabetes. Testing should be considered at a younger age or be carried out more frequently if you are overweight and have at least 1 risk factor for diabetes.  Colorectal cancer can be detected and often prevented. Most routine colorectal cancer screening begins at the age of 50  and continues through age 75. However, your health care provider may recommend screening at an earlier age if you have risk factors for colon cancer. On a yearly basis, your health care provider may provide home test kits to check for hidden blood in the stool. A small camera at the end of a tube may be used to directly examine the colon (sigmoidoscopy or colonoscopy) to detect the earliest forms of colorectal cancer. Talk to your health care provider about this at age 50 when routine screening begins. A direct exam of the colon should be repeated every 5-10 years through age 75, unless early forms of precancerous polyps or small growths are found.  People who are at an increased risk for hepatitis B should be screened for this virus. You are considered at high risk for hepatitis B if:  You were born in a country where hepatitis B occurs often. Talk with your health care provider about which countries are considered high risk.  Your parents were born in a high-risk country and you have not received a shot to protect against hepatitis B (hepatitis B vaccine).  You have HIV or AIDS.  You use needles to inject street drugs.  You live with, or have sex with, someone who has hepatitis B.  You are a man who has sex with other men (MSM).  You get hemodialysis treatment.  You take certain medicines for conditions like cancer, organ transplantation, and autoimmune conditions.  Hepatitis C blood testing is recommended for all people born from 1945 through 1965 and any individual with known risk factors for hepatitis C.  Healthy men should no longer receive prostate-specific antigen (PSA) blood tests as part of routine cancer screening. Talk to your health care provider about prostate cancer screening.  Testicular cancer screening is not recommended for adolescents or adult males who have no symptoms. Screening includes self-exam, a health care provider exam, and other screening tests. Consult with your  health care provider about any symptoms you have or any concerns you have about testicular cancer.  Practice safe sex. Use condoms and avoid high-risk sexual practices to reduce the spread of sexually transmitted infections (STIs).  You should be screened for STIs, including gonorrhea and chlamydia if:  You are sexually active and are younger than 24 years.  You are older than 24 years, and your health care provider tells you that you are at risk for this type of infection.  Your sexual activity has changed since you were last screened, and you are at an increased risk for chlamydia or gonorrhea. Ask your health care provider if you are at risk.  If you are at risk of being infected with HIV, it is recommended that you take a prescription medicine daily to prevent HIV infection. This is called pre-exposure prophylaxis (PrEP). You are considered at risk if:  You are a man who has sex with other men (MSM).  You are a heterosexual man who   is sexually active with multiple partners.  You take drugs by injection.  You are sexually active with a partner who has HIV.  Talk with your health care provider about whether you are at high risk of being infected with HIV. If you choose to begin PrEP, you should first be tested for HIV. You should then be tested every 3 months for as long as you are taking PrEP.  Use sunscreen. Apply sunscreen liberally and repeatedly throughout the day. You should seek shade when your shadow is shorter than you. Protect yourself by wearing long sleeves, pants, a wide-brimmed hat, and sunglasses year round whenever you are outdoors.  Tell your health care provider of new moles or changes in moles, especially if there is a change in shape or color. Also, tell your health care provider if a mole is larger than the size of a pencil eraser.  A one-time screening for abdominal aortic aneurysm (AAA) and surgical repair of large AAAs by ultrasound is recommended for men aged  65-75 years who are current or former smokers.  Stay current with your vaccines (immunizations). Document Released: 03/11/2008 Document Revised: 09/18/2013 Document Reviewed: 02/08/2011 [employer] Patient Information 2015 [employer]. This information is not intended to replace advice given to you by your health care provider. Make sure you discuss any questions you have with your health care provider.  Hypertension Hypertension, commonly called high blood pressure, is when the force of blood pumping through your arteries is too strong. Your arteries are the blood vessels that carry blood from your heart throughout your body. A blood pressure reading consists of a higher number over a lower number, such as 110/72. The higher number (systolic) is the pressure inside your arteries when your heart pumps. The lower number (diastolic) is the pressure inside your arteries when your heart relaxes. Ideally you want your blood pressure below 120/80. Hypertension forces your heart to work harder to pump blood. Your arteries may become narrow or stiff. Having hypertension puts you at risk for heart disease, stroke, and other problems.  RISK FACTORS Some risk factors for high blood pressure are controllable. Others are not.  Risk factors you cannot control include:   Race. You may be at higher risk if you are African American.  Age. Risk increases with age.  Gender. Men are at higher risk than women before age 45 years. After age 65, women are at higher risk than men. Risk factors you can control include:  Not getting enough exercise or physical activity.  Being overweight.  Getting too much fat, sugar, calories, or salt in your diet.  Drinking too much alcohol. SIGNS AND SYMPTOMS Hypertension does not usually cause signs or symptoms. Extremely high blood pressure (hypertensive crisis) may cause headache, anxiety, shortness of breath, and nosebleed. DIAGNOSIS  To check if you have hypertension,  your health care provider will measure your blood pressure while you are seated, with your arm held at the level of your heart. It should be measured at least twice using the same arm. Certain conditions can cause a difference in blood pressure between your right and left arms. A blood pressure reading that is higher than normal on one occasion does not mean that you need treatment. If one blood pressure reading is high, ask your health care provider about having it checked again. TREATMENT  Treating high blood pressure includes making lifestyle changes and possibly taking medicine. Living a healthy lifestyle can help lower high blood pressure. You may need to change   some of your habits. Lifestyle changes may include:  Following the DASH diet. This diet is high in fruits, vegetables, and whole grains. It is low in salt, red meat, and added sugars.  Getting at least 2 hours of brisk physical activity every week.  Losing weight if necessary.  Not smoking.  Limiting alcoholic beverages.  Learning ways to reduce stress. If lifestyle changes are not enough to get your blood pressure under control, your health care provider may prescribe medicine. You may need to take more than one. Work closely with your health care provider to understand the risks and benefits. HOME CARE INSTRUCTIONS  Have your blood pressure rechecked as directed by your health care provider.   Take medicines only as directed by your health care provider. Follow the directions carefully. Blood pressure medicines must be taken as prescribed. The medicine does not work as well when you skip doses. Skipping doses also puts you at risk for problems.   Do not smoke.   Monitor your blood pressure at home as directed by your health care provider. SEEK MEDICAL CARE IF:   You think you are having a reaction to medicines taken.  You have recurrent headaches or feel dizzy.  You have swelling in your ankles.  You have  trouble with your vision. SEEK IMMEDIATE MEDICAL CARE IF:  You develop a severe headache or confusion.  You have unusual weakness, numbness, or feel faint.  You have severe chest or abdominal pain.  You vomit repeatedly.  You have trouble breathing. MAKE SURE YOU:   Understand these instructions.  Will watch your condition.  Will get help right away if you are not doing well or get worse. Document Released: 09/13/2005 Document Revised: 01/28/2014 Document Reviewed: 07/06/2013 [employer] Patient Information 2015 [employer]. This information is not intended to replace advice given to you by your health care provider. Make sure you discuss any questions you have with your health care provider.  

## 2014-07-16 NOTE — Assessment & Plan Note (Signed)
Exam done Vaccines were reviewed and updated Labs ordered Pt ed material was given 

## 2014-07-16 NOTE — Assessment & Plan Note (Signed)
He does not want to start a med yet, he will work on his lifestyle modifications

## 2014-07-16 NOTE — Assessment & Plan Note (Signed)
His B12 level is normal now 

## 2014-09-05 ENCOUNTER — Encounter (HOSPITAL_COMMUNITY): Payer: Self-pay | Admitting: Cardiovascular Disease

## 2014-11-18 ENCOUNTER — Ambulatory Visit (INDEPENDENT_AMBULATORY_CARE_PROVIDER_SITE_OTHER): Payer: 59 | Admitting: Internal Medicine

## 2014-11-18 ENCOUNTER — Encounter: Payer: Self-pay | Admitting: Internal Medicine

## 2014-11-18 VITALS — BP 134/84 | HR 108 | Temp 98.4°F | Ht 68.0 in | Wt 253.8 lb

## 2014-11-18 DIAGNOSIS — E669 Obesity, unspecified: Secondary | ICD-10-CM

## 2014-11-18 DIAGNOSIS — N529 Male erectile dysfunction, unspecified: Secondary | ICD-10-CM

## 2014-11-18 DIAGNOSIS — M5030 Other cervical disc degeneration, unspecified cervical region: Secondary | ICD-10-CM

## 2014-11-18 DIAGNOSIS — I10 Essential (primary) hypertension: Secondary | ICD-10-CM

## 2014-11-18 MED ORDER — TADALAFIL 20 MG PO TABS: 20.0000 mg | ORAL_TABLET | Freq: Every day | ORAL | Status: DC | PRN

## 2014-11-18 MED ORDER — LORCASERIN HCL 10 MG PO TABS: 1.0000 | ORAL_TABLET | Freq: Two times a day (BID) | ORAL | Status: DC

## 2014-11-18 MED ORDER — METHOCARBAMOL 500 MG PO TABS: 500.0000 mg | ORAL_TABLET | Freq: Three times a day (TID) | ORAL | Status: DC | PRN

## 2014-11-18 NOTE — Patient Instructions (Signed)
Erectile Dysfunction Erectile dysfunction is the inability to get or sustain a good enough erection to have sexual intercourse. Erectile dysfunction may involve:  Inability to get an erection.  Lack of enough hardness to allow penetration.  Loss of the erection before sex is finished.  Premature ejaculation. CAUSES  Certain drugs, such as:  Pain relievers.  Antihistamines.  Antidepressants.  Blood pressure medicines.  Water pills (diuretics).  Ulcer medicines.  Muscle relaxants.  Illegal drugs.  Excessive drinking.  Psychological causes, such as:  Anxiety.  Depression.  Sadness.  Exhaustion.  Performance fear.  Stress.  Physical causes, such as:  Artery problems. This may include diabetes, smoking, liver disease, or atherosclerosis.  High blood pressure.  Hormonal problems, such as low testosterone.  Obesity.  Nerve problems. This may include back or pelvic injuries, diabetes mellitus, multiple sclerosis, or Parkinson disease. SYMPTOMS  Inability to get an erection.  Lack of enough hardness to allow penetration.  Loss of the erection before sex is finished.  Premature ejaculation.  Normal erections at some times, but with frequent unsatisfactory episodes.  Orgasms that are not satisfactory in sensation or frequency.  Low sexual satisfaction in either partner because of erection problems.  A curved penis occurring with erection. The curve may cause pain or may be too curved to allow for intercourse.  Never having nighttime erections. DIAGNOSIS Your caregiver can often diagnose this condition by:  Performing a physical exam to find other diseases or specific problems with the penis.  Asking you detailed questions about the problem.  Performing blood tests to check for diabetes mellitus or to measure hormone levels.  Performing urine tests to find other underlying health conditions.  Performing an ultrasound exam to check for  scarring.  Performing a test to check blood flow to the penis.  Doing a sleep study at home to measure nighttime erections. TREATMENT   You may be prescribed medicines by mouth.  You may be given medicine injections into the penis.  You may be prescribed a vacuum pump with a ring.  Penile implant surgery may be performed. You may receive:  An inflatable implant.  A semirigid implant.  Blood vessel surgery may be performed. HOME CARE INSTRUCTIONS  If you are prescribed oral medicine, you should take the medicine as prescribed. Do not increase the dosage without first discussing it with your physician.  If you are using self-injections, be careful to avoid any veins that are on the surface of the penis. Apply pressure to the injection site for 5 minutes.  If you are using a vacuum pump, make sure you have read the instructions before using it. Discuss any questions with your physician before taking the pump home. SEEK MEDICAL CARE IF:  You experience pain that is not responsive to the pain medicine you have been prescribed.  You experience nausea or vomiting. SEEK IMMEDIATE MEDICAL CARE IF:   When taking oral or injectable medications, you experience an erection that lasts longer than 4 hours. If your physician is unavailable, go to the nearest emergency room for evaluation. An erection that lasts much longer than 4 hours can result in permanent damage to your penis.  You have pain that is severe.  You develop redness, severe pain, or severe swelling of your penis.  You have redness spreading up into your groin or lower abdomen.  You are unable to pass your urine. Document Released: 09/10/2000 Document Revised: 05/16/2013 Document Reviewed: 02/15/2013 [facility] Patient Information 2015 [place], [place]. This information is not  intended to replace advice given to you by your health care provider. Make sure you discuss any questions you have with your health care provider.

## 2014-11-18 NOTE — Progress Notes (Signed)
Pre visit review using our clinic review tool, if applicable. No additional management support is needed unless otherwise documented below in the visit note. 

## 2014-11-19 NOTE — Progress Notes (Signed)
   Subjective:    Patient ID: Taylor Schroeder, male    DOB: 1965/11/09, 49 y.o.   MRN: [id number]  HPI Comments: He returns for a f/up and complains that he has gained weight and wants to start belviq again. Also, he has recurring episodes of neck pain and needs a refill on robaxin, he sees his orthopedist about this soon.     Review of Systems  Constitutional: Positive for unexpected weight change. Negative for fever, chills, diaphoresis, appetite change and fatigue.  HENT: Negative.  Negative for trouble swallowing and voice change.   Eyes: Negative.   Respiratory: Negative.  Negative for cough, choking, chest tightness, shortness of breath and stridor.   Cardiovascular: Negative.  Negative for chest pain, palpitations and leg swelling.  Gastrointestinal: Negative.  Negative for nausea, vomiting, abdominal pain, diarrhea, constipation and blood in stool.  Endocrine: Negative.   Genitourinary: Negative.  Negative for dysuria, urgency, frequency, hematuria, flank pain, decreased urine volume and difficulty urinating.       He complains of ED  Musculoskeletal: Positive for neck pain and neck stiffness. Negative for myalgias, back pain, joint swelling, arthralgias and gait problem.  Skin: Negative.   Allergic/Immunologic: Negative.   Neurological: Negative.  Negative for dizziness, tremors, weakness, light-headedness and numbness.  Hematological: Negative.  Negative for adenopathy. Does not bruise/bleed easily.  Psychiatric/Behavioral: Negative.        Objective:   Physical Exam  Constitutional: He is oriented to person, place, and time. He appears well-developed and well-nourished. No distress.  HENT:  Head: Normocephalic and atraumatic.  Mouth/Throat: Oropharynx is clear and moist. No oropharyngeal exudate.  Eyes: Conjunctivae are normal. Right eye exhibits no discharge. Left eye exhibits no discharge. No scleral icterus.  Neck: Normal range of motion. Neck supple. No JVD present.  No tracheal deviation present. No thyromegaly present.  Cardiovascular: Normal rate, regular rhythm, normal heart sounds and intact distal pulses.  Exam reveals no gallop and no friction rub.   No murmur heard. Pulmonary/Chest: Effort normal and breath sounds normal. No stridor. No respiratory distress. He has no wheezes. He has no rales. He exhibits no tenderness.  Abdominal: Soft. Bowel sounds are normal. He exhibits no distension and no mass. There is no tenderness. There is no rebound and no guarding.  Musculoskeletal: Normal range of motion. He exhibits no edema or tenderness.  Lymphadenopathy:    He has no cervical adenopathy.  Neurological: He is oriented to person, place, and time.  Skin: Skin is warm and dry. No rash noted. He is not diaphoretic. No erythema. No pallor.  Vitals reviewed.    Lab Results  Component Value Date   WBC 8.9 07/15/2014   HGB 14.2 07/15/2014   HCT 42.4 07/15/2014   PLT 299.0 07/15/2014   GLUCOSE 93 07/15/2014   CHOL 210* 07/15/2014   TRIG 118.0 07/15/2014   HDL 43.80 07/15/2014   LDLDIRECT 147.6 08/29/2012   LDLCALC 143* 07/15/2014   ALT 30 07/15/2014   AST 21 07/15/2014   NA 139 07/15/2014   K 4.1 07/15/2014   CL 103 07/15/2014   CREATININE 0.9 07/15/2014   BUN 14 07/15/2014   CO2 20 07/15/2014   TSH 2.34 07/15/2014   PSA 2.10 07/15/2014   INR 0.94 01/30/2014   HGBA1C 5.5 07/15/2014       Assessment & Plan:

## 2014-11-19 NOTE — Assessment & Plan Note (Signed)
Will restart robaxin

## 2014-11-19 NOTE — Assessment & Plan Note (Signed)
He will cont to work on his lifestyle modifications Will restart lebviq

## 2014-11-19 NOTE — Assessment & Plan Note (Signed)
His BP is well controlled 

## 2014-11-19 NOTE — Assessment & Plan Note (Signed)
He will try cialis for this

## 2014-11-22 ENCOUNTER — Telehealth: Payer: Self-pay | Admitting: Internal Medicine

## 2014-11-22 NOTE — Telephone Encounter (Signed)
Pt called in and said that [facility] was suppose to fax over [place] a PA for Lorcaserin HCl (BELVIQ) 10 MG TABS [[id number]] .  He is wanting to know when he can get this medication?

## 2014-11-22 NOTE — Telephone Encounter (Signed)
PA clinical information submitted to [facility], approval now pending their decision.

## 2014-11-22 NOTE — Telephone Encounter (Signed)
I have not been in the office since 11/15/14 and have not seen any PA or faxed rejection from pharmacist for this patient or medication.

## 2014-11-25 NOTE — Telephone Encounter (Signed)
Per insurance medication approved

## 2014-12-04 ENCOUNTER — Other Ambulatory Visit: Payer: Self-pay | Admitting: Orthopaedic Surgery

## 2014-12-04 DIAGNOSIS — M542 Cervicalgia: Secondary | ICD-10-CM

## 2014-12-10 IMAGING — US US EXTREM LOW VENOUS*L*
1 series · 14 of 24 positions shown · non-contrast
Comparison: None.

CLINICAL DATA: Patient complains of pain and edema.  Rule out DVT.

EXAM:
LEFT LOWER EXTREMITY VENOUS DOPPLER ULTRASOUND
TECHNIQUE: Gray-scale sonography with graded compression, as well as color
Doppler and duplex ultrasound, were performed to evaluate the deep
venous system from the level of the common femoral vein through the
popliteal and proximal calf veins. Spectral Doppler was utilized to
evaluate flow at rest and with distal augmentation maneuvers.

[Series 1: us extrem low venous*left* · 14 of 27 slices shown]
[im 1/27]
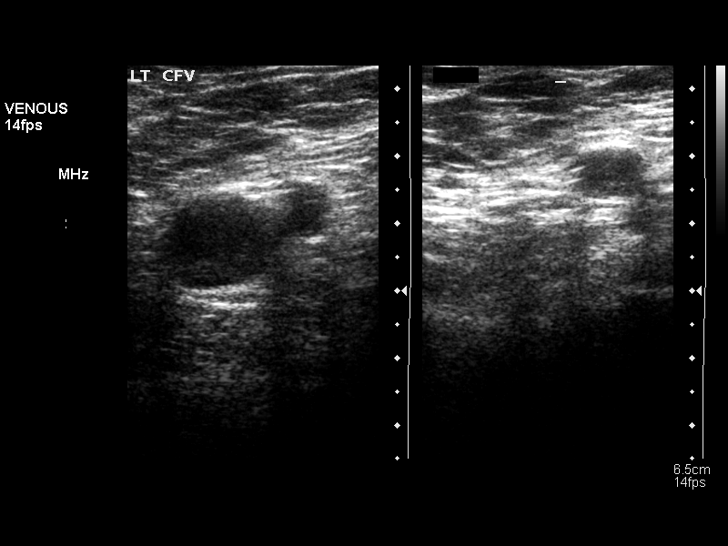
[im 3/27]
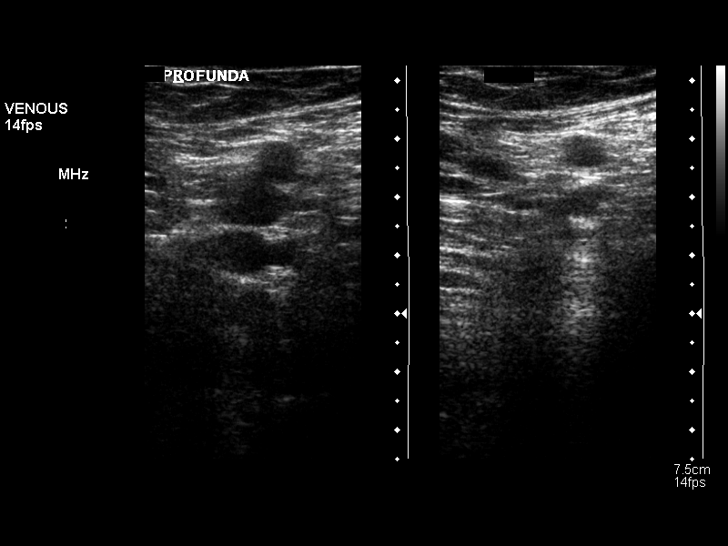
[im 5/27]
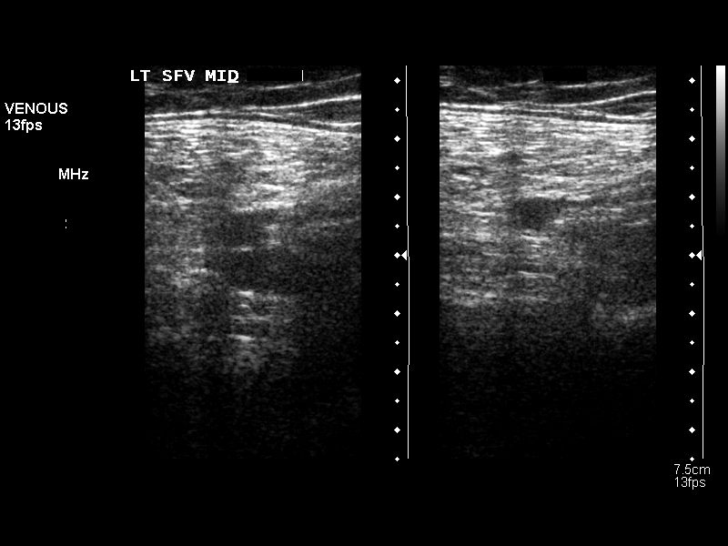
[im 7/27]
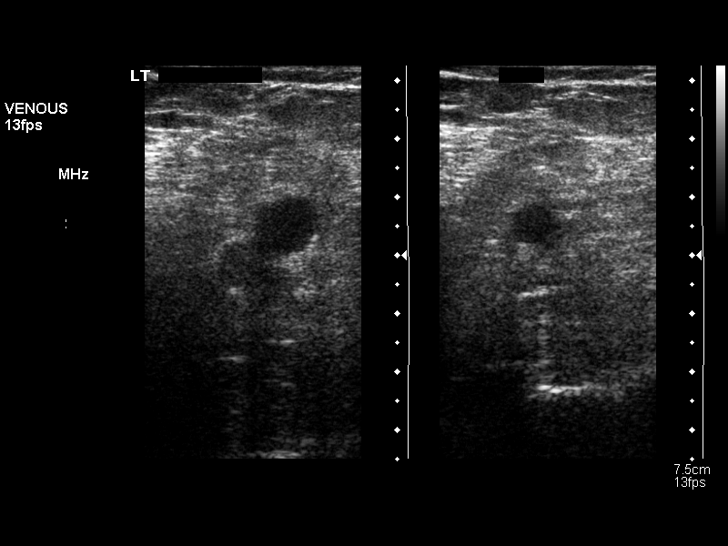
[im 8/27]
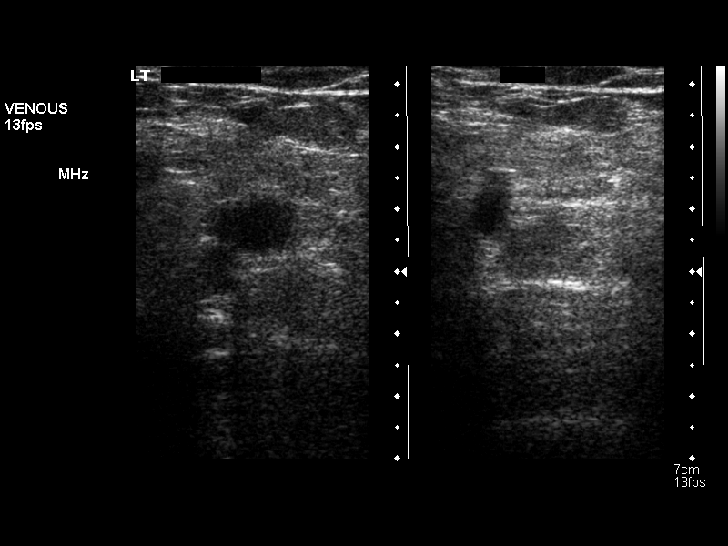
[im 11/27]
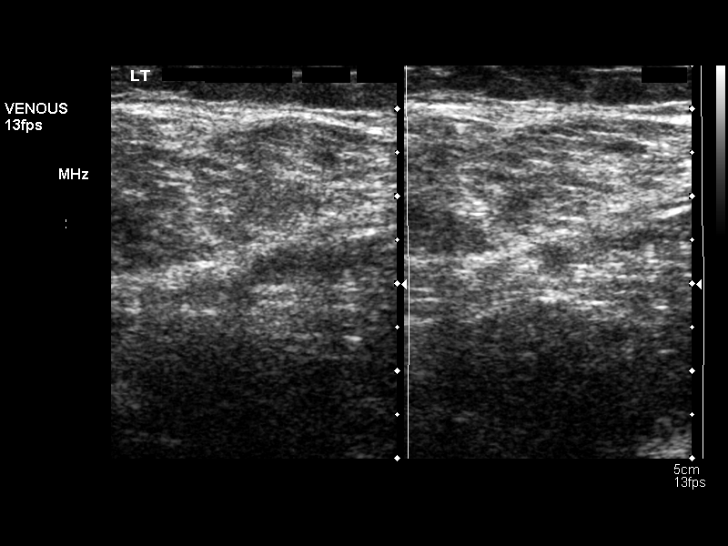
[im 13/27]
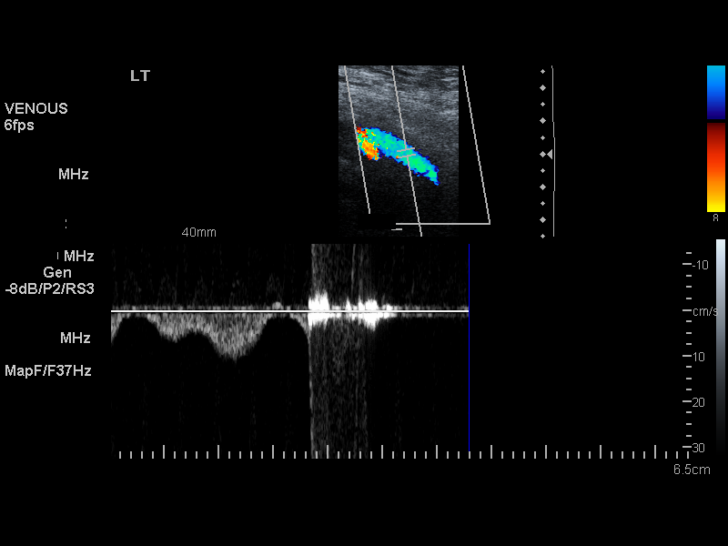
[im 14/27]
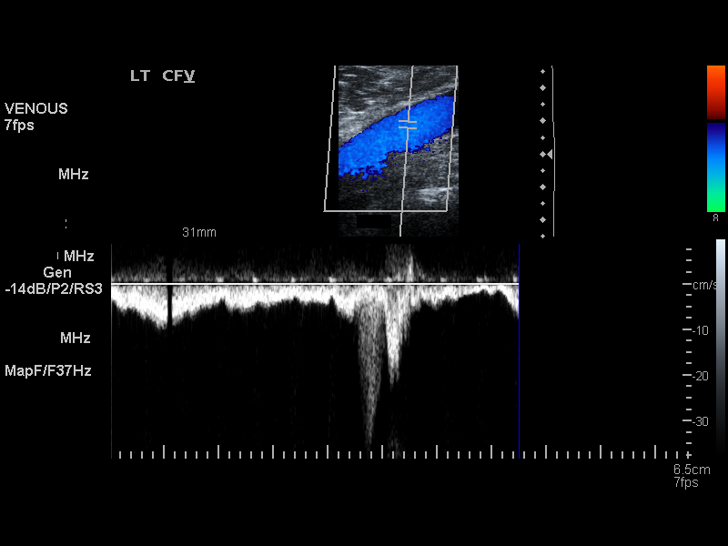
[im 16/27]
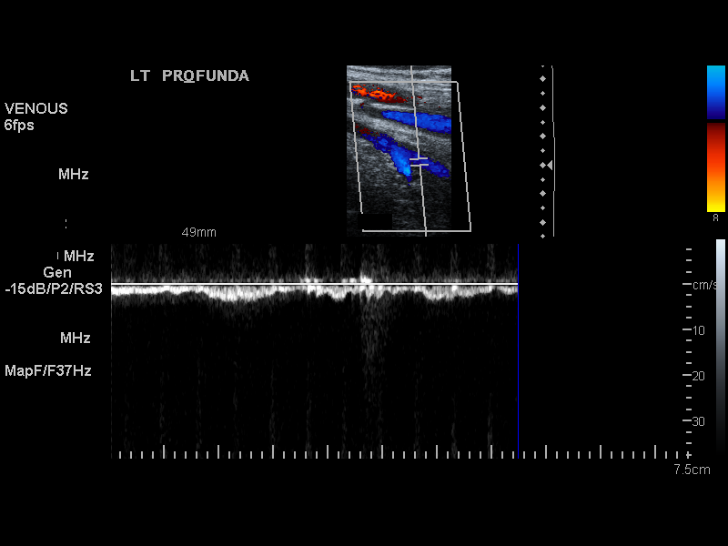
[im 19/27]
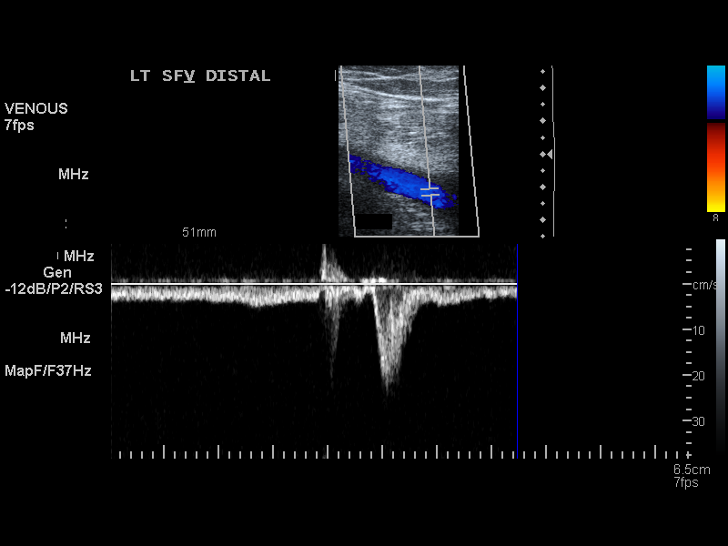
[im 21/27]
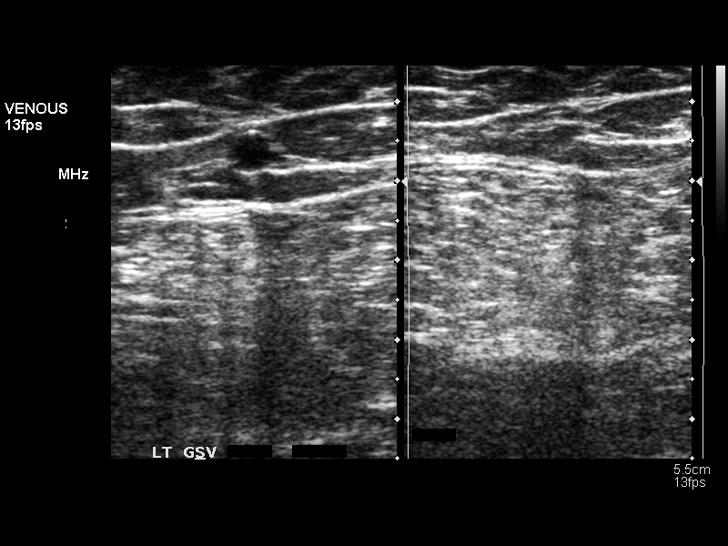
[im 22/27]
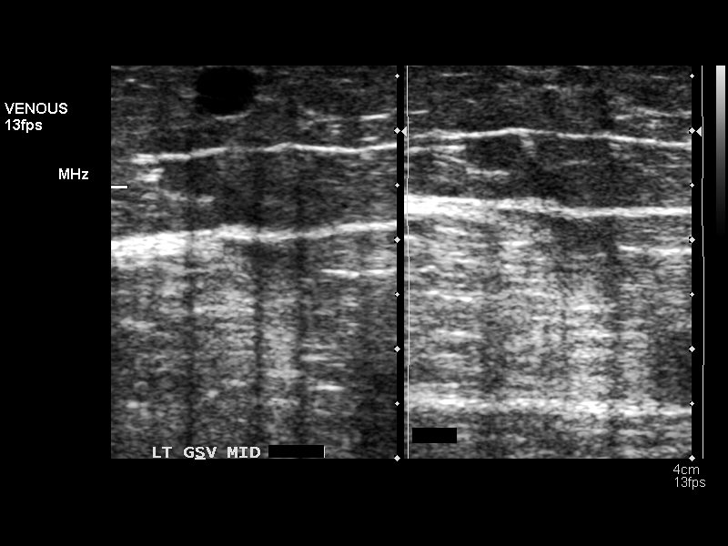
[im 24/27]
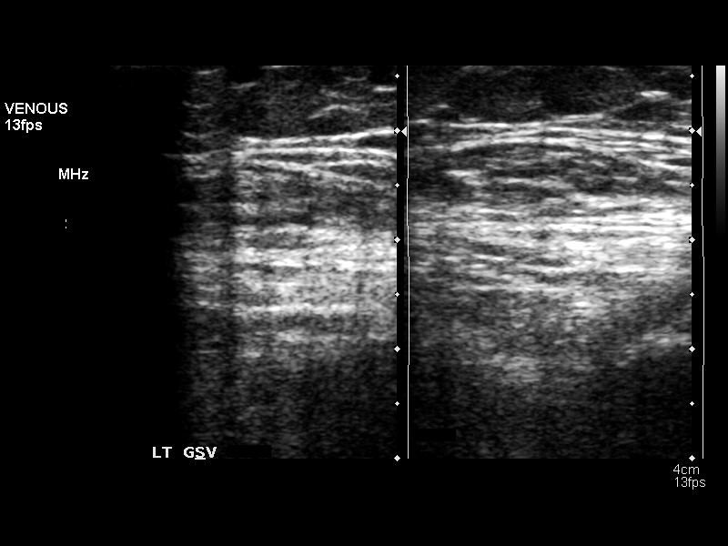
[im 27/27]
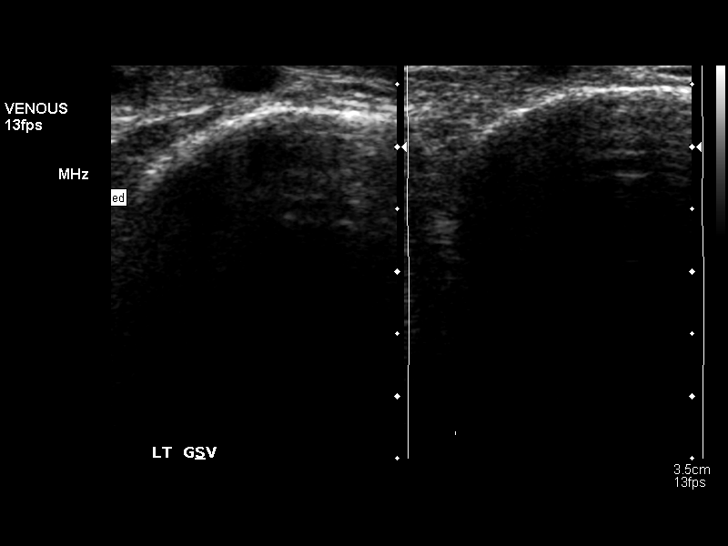

[14 of 24 positions shown; findings below may reference images not displayed]

FINDINGS: Normal compressibility of the left common femoral vein, left femoral
vein and left popliteal vein. Visualized deep calf veins are patent.
The left great saphenous vein is compressible and patent.
IMPRESSION: Negative for left lower extremity DVT.

## 2014-12-12 ENCOUNTER — Ambulatory Visit
Admission: RE | Admit: 2014-12-12 | Discharge: 2014-12-12 | Disposition: A | Discharge status: Home / Self Care | Payer: 59 | Source: Ambulatory Visit | Attending: Orthopaedic Surgery | Admitting: Orthopaedic Surgery

## 2014-12-12 DIAGNOSIS — M542 Cervicalgia: Secondary | ICD-10-CM

## 2015-11-18 ENCOUNTER — Ambulatory Visit (INDEPENDENT_AMBULATORY_CARE_PROVIDER_SITE_OTHER): Payer: Self-pay | Admitting: Internal Medicine

## 2015-11-18 ENCOUNTER — Encounter: Payer: Self-pay | Admitting: Internal Medicine

## 2015-11-18 VITALS — BP 140/98 | HR 86 | Temp 98.0°F | Resp 16 | Ht 68.0 in | Wt 262.2 lb

## 2015-11-18 DIAGNOSIS — I10 Essential (primary) hypertension: Secondary | ICD-10-CM

## 2015-11-18 DIAGNOSIS — J0100 Acute maxillary sinusitis, unspecified: Secondary | ICD-10-CM | POA: Insufficient documentation

## 2015-11-18 DIAGNOSIS — J0101 Acute recurrent maxillary sinusitis: Secondary | ICD-10-CM

## 2015-11-18 MED ORDER — AMOXICILLIN-POT CLAVULANATE 875-125 MG PO TABS: 1.0000 | ORAL_TABLET | Freq: Two times a day (BID) | ORAL | Status: AC

## 2015-11-18 MED ORDER — AZILSARTAN-CHLORTHALIDONE 40-12.5 MG PO TABS: 1.0000 | ORAL_TABLET | Freq: Every day | ORAL | Status: DC

## 2015-11-18 MED ORDER — AMOXICILLIN-POT CLAVULANATE 875-125 MG PO TABS: 1.0000 | ORAL_TABLET | Freq: Two times a day (BID) | ORAL | Status: DC

## 2015-11-18 MED ORDER — FLUTICASONE PROPIONATE 50 MCG/ACT NA SUSP: 2.0000 | Freq: Every day | NASAL | Status: DC

## 2015-11-18 NOTE — Progress Notes (Signed)
Subjective:  Patient ID: Taylor Schroeder, male    DOB: [birth year]  Age: 50 y.o. MRN: [id number]  CC: Hypertension and Sinusitis   HPI Taylor Schroeder presents for follow-up on hypertension and a chronic sinus infection.  He has a history of hypertension and has not treated it over the last year since he lost his job and lost his insurance. He now has a new job with [employer] but he cannot work unless his blood pressures less than 140/90. He complains of an occasional episode of headache but he denies blurred vision, chest pain, shortness of breath, nausea, vomiting, or paresthesias. He does report occasional episodes of feeling like his hands and toes get swollen when he eats a lot of sodium.  He also complains of a several month history of sinus infection with right-sided facial pain and nasal mucus that is thick, yellow-green and has a foul odor. Sometimes the facial pain radiates into his right ear.  Outpatient Prescriptions Prior to Visit  Medication Sig Dispense Refill  . aspirin EC 325 MG tablet Take 1 tablet (325 mg total) by mouth daily. 30 tablet 0  . omeprazole (PRILOSEC OTC) 20 MG tablet Take 20 mg by mouth daily.     . Lorcaserin HCl (BELVIQ) 10 MG TABS Take 1 tablet by mouth 2 (two) times daily. (Patient not taking: Reported on 11/18/2015) 60 tablet 5  . methocarbamol (ROBAXIN) 500 MG tablet Take 1 tablet (500 mg total) by mouth every 8 (eight) hours as needed for muscle spasms. (Patient not taking: Reported on 11/18/2015) 60 tablet 11  . tadalafil (CIALIS) 20 MG tablet Take 1 tablet (20 mg total) by mouth daily as needed for erectile dysfunction. (Patient not taking: Reported on 11/18/2015) 10 tablet 11   No facility-administered medications prior to visit.    ROS Review of Systems  Constitutional: Positive for unexpected weight change (some weight gain). Negative for fever, chills, diaphoresis, appetite change and fatigue.  HENT: Positive for congestion,  postnasal drip and sinus pressure. Negative for facial swelling, sneezing, sore throat, tinnitus and trouble swallowing.   Eyes: Negative.  Negative for photophobia and visual disturbance.  Respiratory: Negative.  Negative for cough, choking, chest tightness, shortness of breath and stridor.   Cardiovascular: Negative.  Negative for chest pain, palpitations and leg swelling.  Gastrointestinal: Negative.  Negative for nausea, vomiting, abdominal pain, diarrhea, constipation and blood in stool.  Endocrine: Negative.   Genitourinary: Negative.   Musculoskeletal: Negative.  Negative for back pain and neck pain.  Skin: Negative.  Negative for color change and rash.  Allergic/Immunologic: Negative.   Neurological: Positive for headaches. Negative for seizures, syncope, speech difficulty, weakness and numbness.  Hematological: Negative.  Negative for adenopathy. Does not bruise/bleed easily.  Psychiatric/Behavioral: Negative.     Objective:  BP 140/98 mmHg  Pulse 86  Temp(Src) 98 F (36.7 C) (Oral)  Resp 16  Ht 5\' 8"  (1.727 m)  Wt 262 lb 4 oz (118.956 kg)  BMI 39.88 kg/m2  SpO2 95%  BP Readings from Last 3 Encounters:  11/18/15 140/98  11/18/14 134/84  07/15/14 140/96    Wt Readings from Last 3 Encounters:  11/18/15 262 lb 4 oz (118.956 kg)  11/18/14 253 lb 12 oz (115.1 kg)  07/15/14 245 lb (111.131 kg)    Physical Exam  Constitutional: He is oriented to person, place, and time. He appears well-developed and well-nourished. No distress.  HENT:  Head: Normocephalic and atraumatic.  Right Ear: Hearing, tympanic membrane,  external ear and ear canal normal.  Left Ear: Hearing, tympanic membrane, external ear and ear canal normal.  Nose: Mucosal edema present. No rhinorrhea or sinus tenderness. No epistaxis. Right sinus exhibits maxillary sinus tenderness. Right sinus exhibits no frontal sinus tenderness. Left sinus exhibits no maxillary sinus tenderness and no frontal sinus  tenderness.  Mouth/Throat: Oropharynx is clear and moist and mucous membranes are normal. Mucous membranes are not pale, not dry and not cyanotic. No oral lesions. No trismus in the jaw. No uvula swelling. No oropharyngeal exudate, posterior oropharyngeal edema, posterior oropharyngeal erythema or tonsillar abscesses.  Eyes: Conjunctivae are normal. Right eye exhibits no discharge. Left eye exhibits no discharge. No scleral icterus.  Neck: Normal range of motion. Neck supple. No JVD present. No tracheal deviation present. No thyromegaly present.  Cardiovascular: Normal rate, regular rhythm, normal heart sounds and intact distal pulses.  Exam reveals no gallop and no friction rub.   No murmur heard. Pulmonary/Chest: Effort normal and breath sounds normal. No stridor. No respiratory distress. He has no wheezes. He has no rales. He exhibits no tenderness.  Abdominal: Soft. Bowel sounds are normal. He exhibits no distension and no mass. There is no tenderness. There is no rebound and no guarding.  Musculoskeletal: Normal range of motion. He exhibits no edema or tenderness.  Lymphadenopathy:    He has no cervical adenopathy.  Neurological: He is oriented to person, place, and time.  Skin: Skin is warm and dry. No rash noted. He is not diaphoretic. No erythema. No pallor.  Vitals reviewed.   Lab Results  Component Value Date   WBC 8.9 07/15/2014   HGB 14.2 07/15/2014   HCT 42.4 07/15/2014   PLT 299.0 07/15/2014   GLUCOSE 93 07/15/2014   CHOL 210* 07/15/2014   TRIG 118.0 07/15/2014   HDL 43.80 07/15/2014   LDLDIRECT 147.6 08/29/2012   LDLCALC 143* 07/15/2014   ALT 30 07/15/2014   AST 21 07/15/2014   NA 139 07/15/2014   K 4.1 07/15/2014   CL 103 07/15/2014   CREATININE 0.9 07/15/2014   BUN 14 07/15/2014   CO2 20 07/15/2014   TSH 2.34 07/15/2014   PSA 2.10 07/15/2014   INR 0.94 01/30/2014   HGBA1C 5.5 07/15/2014    Mr Cervical Spine Wo Contrast  12/13/2014  CLINICAL DATA:  Neck pain  and right shoulder pain. Right hand numbness. EXAM: MRI CERVICAL SPINE WITHOUT CONTRAST TECHNIQUE: Multiplanar, multisequence MR imaging of the cervical spine was performed. No intravenous contrast was administered. COMPARISON:  CT scan of the neck dated 02/27/2014 FINDINGS: The visualized intracranial contents, paraspinal soft tissues, and cervical spinal cord. No facet arthritis in the cervical spine. C1-2 and C2-3:  Normal. C3-4: Minimal uncinate osteophytes create moderate bilateral foraminal narrowing. C4-5:  Slight bilateral foraminal narrowing. C5-6: Disc space narrowing. Osteophytes create moderate bilateral foraminal narrowing. C6-7: Disc space narrowing.  No foraminal narrowing. C7-T1 and T1-2:  Normal. IMPRESSION: 1. Multilevel degenerative disc disease with moderate foraminal narrowing at C3-4 and C5-6 and slight bilateral foraminal narrowing at C4-5. 2. No disc protrusions or discrete neural impingement. No significant change since the prior neck CT dated 02/27/2014. Electronically Signed   By: Lorriane Shire M.D.   On: 12/13/2014 10:10    Assessment & Plan:   Rhandy was seen today for hypertension and sinusitis.  Diagnoses and all orders for this visit:  Essential hypertension -     Azilsartan-Chlorthalidone (EDARBYCLOR) 40-12.5 MG TABS; Take 1 tablet by mouth daily.  Acute recurrent  maxillary sinusitis -     Discontinue: amoxicillin-clavulanate (AUGMENTIN) 875-125 MG tablet; Take 1 tablet by mouth 2 (two) times daily. -     fluticasone (FLONASE) 50 MCG/ACT nasal spray; Place 2 sprays into both nostrils daily. -     amoxicillin-clavulanate (AUGMENTIN) 875-125 MG tablet; Take 1 tablet by mouth 2 (two) times daily.   I have discontinued Mr. Clementson's Lorcaserin HCl, tadalafil, and methocarbamol. I am also having him start on fluticasone and Azilsartan-Chlorthalidone. Additionally, I am having him maintain his omeprazole, aspirin EC, and amoxicillin-clavulanate.  Meds ordered this  encounter  Medications  . DISCONTD: amoxicillin-clavulanate (AUGMENTIN) 875-125 MG tablet    Sig: Take 1 tablet by mouth 2 (two) times daily.    Dispense:  28 tablet    Refill:  1  . fluticasone (FLONASE) 50 MCG/ACT nasal spray    Sig: Place 2 sprays into both nostrils daily.    Dispense:  16 g    Refill:  6  . Azilsartan-Chlorthalidone (EDARBYCLOR) 40-12.5 MG TABS    Sig: Take 1 tablet by mouth daily.    Dispense:  35 tablet    Refill:  0  . amoxicillin-clavulanate (AUGMENTIN) 875-125 MG tablet    Sig: Take 1 tablet by mouth 2 (two) times daily.    Dispense:  28 tablet    Refill:  1     Follow-up: Return in about 4 weeks (around 12/16/2015).  Scarlette Calico, MD

## 2015-11-18 NOTE — Patient Instructions (Signed)
Hypertension Hypertension, commonly called high blood pressure, is when the force of blood pumping through your arteries is too strong. Your arteries are the blood vessels that carry blood from your heart throughout your body. A blood pressure reading consists of a higher number over a lower number, such as 110/72. The higher number (systolic) is the pressure inside your arteries when your heart pumps. The lower number (diastolic) is the pressure inside your arteries when your heart relaxes. Ideally you want your blood pressure below 120/80. Hypertension forces your heart to work harder to pump blood. Your arteries may become narrow or stiff. Having untreated or uncontrolled hypertension can cause heart attack, stroke, kidney disease, and other problems. RISK FACTORS Some risk factors for high blood pressure are controllable. Others are not.  Risk factors you cannot control include:   Race. You may be at higher risk if you are African American.  Age. Risk increases with age.  Gender. Men are at higher risk than women before age 45 years. After age 65, women are at higher risk than men. Risk factors you can control include:  Not getting enough exercise or physical activity.  Being overweight.  Getting too much fat, sugar, calories, or salt in your diet.  Drinking too much alcohol. SIGNS AND SYMPTOMS Hypertension does not usually cause signs or symptoms. Extremely high blood pressure (hypertensive crisis) may cause headache, anxiety, shortness of breath, and nosebleed. DIAGNOSIS To check if you have hypertension, your health care provider will measure your blood pressure while you are seated, with your arm held at the level of your heart. It should be measured at least twice using the same arm. Certain conditions can cause a difference in blood pressure between your right and left arms. A blood pressure reading that is higher than normal on one occasion does not mean that you need treatment. If  it is not clear whether you have high blood pressure, you may be asked to return on a different day to have your blood pressure checked again. Or, you may be asked to monitor your blood pressure at home for 1 or more weeks. TREATMENT Treating high blood pressure includes making lifestyle changes and possibly taking medicine. Living a healthy lifestyle can help lower high blood pressure. You may need to change some of your habits. Lifestyle changes may include:  Following the DASH diet. This diet is high in fruits, vegetables, and whole grains. It is low in salt, red meat, and added sugars.  Keep your sodium intake below 2,300 mg per day.  Getting at least 30-45 minutes of aerobic exercise at least 4 times per week.  Losing weight if necessary.  Not smoking.  Limiting alcoholic beverages.  Learning ways to reduce stress. Your health care provider may prescribe medicine if lifestyle changes are not enough to get your blood pressure under control, and if one of the following is true:  You are 18-59 years of age and your systolic blood pressure is above 140.  You are 60 years of age or older, and your systolic blood pressure is above 150.  Your diastolic blood pressure is above 90.  You have diabetes, and your systolic blood pressure is over 140 or your diastolic blood pressure is over 90.  You have kidney disease and your blood pressure is above 140/90.  You have heart disease and your blood pressure is above 140/90. Your personal target blood pressure may vary depending on your medical conditions, your age, and other factors. HOME CARE INSTRUCTIONS    Have your blood pressure rechecked as directed by your health care provider.   Take medicines only as directed by your health care provider. Follow the directions carefully. Blood pressure medicines must be taken as prescribed. The medicine does not work as well when you skip doses. Skipping doses also puts you at risk for  problems.  Do not smoke.   Monitor your blood pressure at home as directed by your health care provider. SEEK MEDICAL CARE IF:   You think you are having a reaction to medicines taken.  You have recurrent headaches or feel dizzy.  You have swelling in your ankles.  You have trouble with your vision. SEEK IMMEDIATE MEDICAL CARE IF:  You develop a severe headache or confusion.  You have unusual weakness, numbness, or feel faint.  You have severe chest or abdominal pain.  You vomit repeatedly.  You have trouble breathing. MAKE SURE YOU:   Understand these instructions.  Will watch your condition.  Will get help right away if you are not doing well or get worse.   This information is not intended to replace advice given to you by your health care provider. Make sure you discuss any questions you have with your health care provider.   Document Released: 09/13/2005 Document Revised: 01/28/2015 Document Reviewed: 07/06/2013 Elsevier Interactive Patient Education 2016 [employer]  

## 2015-11-18 NOTE — Progress Notes (Signed)
Pre visit review using our clinic review tool, if applicable. No additional management support is needed unless otherwise documented below in the visit note. 

## 2015-11-25 ENCOUNTER — Telehealth: Payer: Self-pay | Admitting: Internal Medicine

## 2015-11-25 NOTE — Telephone Encounter (Signed)
LMOVM regarding note below

## 2015-11-25 NOTE — Telephone Encounter (Signed)
Spoke with pt he took his bp at [facility] yesterday morning and it was 117/77 states he has not taken his pill this AM and his fatique has improved mildly as well as nausea but it has only been a brief while will d.c tomorrow as well and see how he feel since he starts his new job then. Will also take bp after work and call office back

## 2015-11-25 NOTE — Telephone Encounter (Signed)
Pt's was put on different bp medication last week and he is feeling very fatigued and he becomes nauseated after he eats. He wanted me to remind you that he has no insurance right now. He does start his new job tomorrow and that you gave him samples for this.  Please advise pt

## 2015-11-25 NOTE — Telephone Encounter (Signed)
He needs to have his blood pressure check

## 2015-11-25 NOTE — Telephone Encounter (Signed)
Please advise 

## 2015-11-25 NOTE — Telephone Encounter (Signed)
Ask him to stop taking Edarbyclor - that is a combination medication that may be too effective for him. Ask him to come by tomorrow and pick up plain samples of Edarbi without the diuretic.

## 2015-11-26 ENCOUNTER — Ambulatory Visit (INDEPENDENT_AMBULATORY_CARE_PROVIDER_SITE_OTHER): Payer: Self-pay | Admitting: Internal Medicine

## 2015-11-26 VITALS — BP 138/78

## 2015-11-26 DIAGNOSIS — I10 Essential (primary) hypertension: Secondary | ICD-10-CM

## 2015-11-26 MED ORDER — AZILSARTAN MEDOXOMIL 40 MG PO TABS: 1.0000 | ORAL_TABLET | Freq: Every day | ORAL | Status: DC

## 2015-11-27 ENCOUNTER — Encounter: Payer: Self-pay | Admitting: Internal Medicine

## 2015-11-27 NOTE — Progress Notes (Signed)
Subjective:  Patient ID: Taylor Schroeder, male    DOB: 07-08-1966  Age: 50 y.o. MRN: [id number]  CC: Hypertension   HPI Taylor Schroeder presents for a BP check - When I last saw him he was started on an ARB and hydrochlorothiazide combination. He felt like that drove his blood pressure too low and caused nausea and dizziness. He comes back today for blood pressure recheck. He stopped taking the diuretic combination about 2 days ago and since he has felt well.  Outpatient Prescriptions Prior to Visit  Medication Sig Dispense Refill  . amoxicillin-clavulanate (AUGMENTIN) 875-125 MG tablet Take 1 tablet by mouth 2 (two) times daily. 28 tablet 1  . aspirin EC 325 MG tablet Take 1 tablet (325 mg total) by mouth daily. 30 tablet 0  . fluticasone (FLONASE) 50 MCG/ACT nasal spray Place 2 sprays into both nostrils daily. 16 g 6  . omeprazole (PRILOSEC OTC) 20 MG tablet Take 20 mg by mouth daily.      No facility-administered medications prior to visit.    ROS Review of Systems  All other systems reviewed and are negative.   Objective:  BP 138/78 mmHg  BP Readings from Last 3 Encounters:  11/26/15 138/78  11/18/15 140/98  11/18/14 134/84    Wt Readings from Last 3 Encounters:  11/18/15 262 lb 4 oz (118.956 kg)  11/18/14 253 lb 12 oz (115.1 kg)  07/15/14 245 lb (111.131 kg)    Physical Exam  Vitals reviewed.   Lab Results  Component Value Date   WBC 8.9 07/15/2014   HGB 14.2 07/15/2014   HCT 42.4 07/15/2014   PLT 299.0 07/15/2014   GLUCOSE 93 07/15/2014   CHOL 210* 07/15/2014   TRIG 118.0 07/15/2014   HDL 43.80 07/15/2014   LDLDIRECT 147.6 08/29/2012   LDLCALC 143* 07/15/2014   ALT 30 07/15/2014   AST 21 07/15/2014   NA 139 07/15/2014   K 4.1 07/15/2014   CL 103 07/15/2014   CREATININE 0.9 07/15/2014   BUN 14 07/15/2014   CO2 20 07/15/2014   TSH 2.34 07/15/2014   PSA 2.10 07/15/2014   INR 0.94 01/30/2014   HGBA1C 5.5 07/15/2014    Mr Cervical Spine Wo  Contrast  12/13/2014  CLINICAL DATA:  Neck pain and right shoulder pain. Right hand numbness. EXAM: MRI CERVICAL SPINE WITHOUT CONTRAST TECHNIQUE: Multiplanar, multisequence MR imaging of the cervical spine was performed. No intravenous contrast was administered. COMPARISON:  CT scan of the neck dated 02/27/2014 FINDINGS: The visualized intracranial contents, paraspinal soft tissues, and cervical spinal cord. No facet arthritis in the cervical spine. C1-2 and C2-3:  Normal. C3-4: Minimal uncinate osteophytes create moderate bilateral foraminal narrowing. C4-5:  Slight bilateral foraminal narrowing. C5-6: Disc space narrowing. Osteophytes create moderate bilateral foraminal narrowing. C6-7: Disc space narrowing.  No foraminal narrowing. C7-T1 and T1-2:  Normal. IMPRESSION: 1. Multilevel degenerative disc disease with moderate foraminal narrowing at C3-4 and C5-6 and slight bilateral foraminal narrowing at C4-5. 2. No disc protrusions or discrete neural impingement. No significant change since the prior neck CT dated 02/27/2014. Electronically Signed   By: Lorriane Shire M.D.   On: 12/13/2014 10:10    Assessment & Plan:   Taylor Schroeder was seen today for hypertension.  Diagnoses and all orders for this visit:  Essential hypertension- will discontinue the hydrochlorothiazide since he didn't tolerate it, will try to control his blood pressure with the ARB. -     Azilsartan Medoxomil (EDARBI) 40 MG TABS;  Take 1 tablet by mouth daily.   I am having Taylor Schroeder start on Azilsartan Medoxomil. I am also having him maintain his omeprazole, aspirin EC, fluticasone, and amoxicillin-clavulanate.  Meds ordered this encounter  Medications  . Azilsartan Medoxomil (EDARBI) 40 MG TABS    Sig: Take 1 tablet by mouth daily.    Dispense:  35 tablet    Refill:  0     Follow-up: No Follow-up on file.  Scarlette Calico, MD

## 2015-12-03 ENCOUNTER — Ambulatory Visit: Payer: Self-pay

## 2015-12-29 ENCOUNTER — Ambulatory Visit (INDEPENDENT_AMBULATORY_CARE_PROVIDER_SITE_OTHER): Payer: BC Managed Care – PPO | Admitting: Internal Medicine

## 2015-12-29 ENCOUNTER — Ambulatory Visit: Payer: Self-pay

## 2015-12-29 ENCOUNTER — Ambulatory Visit: Payer: Self-pay | Admitting: Internal Medicine

## 2015-12-29 ENCOUNTER — Encounter: Payer: Self-pay | Admitting: Internal Medicine

## 2015-12-29 VITALS — BP 106/80 | HR 100 | Temp 98.4°F | Resp 16 | Ht 68.0 in | Wt 270.0 lb

## 2015-12-29 DIAGNOSIS — I10 Essential (primary) hypertension: Secondary | ICD-10-CM

## 2015-12-29 DIAGNOSIS — Z23 Encounter for immunization: Secondary | ICD-10-CM | POA: Diagnosis not present

## 2015-12-29 MED ORDER — HYDROCHLOROTHIAZIDE 12.5 MG PO CAPS: 12.5000 mg | ORAL_CAPSULE | Freq: Every day | ORAL | Status: DC

## 2015-12-29 MED ORDER — LORCASERIN HCL ER 20 MG PO TB24: 1.0000 | ORAL_TABLET | Freq: Every day | ORAL | Status: DC

## 2015-12-29 MED ORDER — HYDROCHLOROTHIAZIDE 12.5 MG PO CAPS
12.5000 mg | ORAL_CAPSULE | Freq: Every day | ORAL | Status: DC
Start: 2015-12-29 — End: 2015-12-29

## 2015-12-29 NOTE — Progress Notes (Signed)
Subjective:  Patient ID: Taylor Schroeder, male    DOB: 09-Oct-1965  Age: 50 y.o. MRN: [id number]  CC: Hypertension   HPI Taylor Schroeder presents for a blood pressure check. He complains that when he takes the ARB plus the hydrochlorothiazide his blood pressure is low and his legs feel sluggish when he is trying to exert himself. He also states that if he doesn't take the water pill his fingers and toes start to feel puffy and he feels like he retains fluid. He denies chest pain, shortness of breath, headache, blurred vision, or edema.  He also wants to try Belviq again to help him lose weight.  Outpatient Prescriptions Prior to Visit  Medication Sig Dispense Refill  . omeprazole (PRILOSEC OTC) 20 MG tablet Take 20 mg by mouth daily.     . Azilsartan Medoxomil (EDARBI) 40 MG TABS Take 1 tablet by mouth daily. 35 tablet 0  . Azilsartan-Chlorthalidone (EDARBYCLOR) 40-12.5 MG TABS Take 1 tablet by mouth daily. 35 tablet 0  . fluticasone (FLONASE) 50 MCG/ACT nasal spray Place 2 sprays into both nostrils daily. (Patient not taking: Reported on 12/29/2015) 16 g 6  . aspirin EC 325 MG tablet Take 1 tablet (325 mg total) by mouth daily. 30 tablet 0   No facility-administered medications prior to visit.    ROS Review of Systems  Constitutional: Negative.  Negative for fever, chills, diaphoresis, appetite change and fatigue. Unexpected weight change: wt gain.  HENT: Negative.   Eyes: Negative.  Negative for visual disturbance.  Respiratory: Negative.  Negative for cough, choking, chest tightness, shortness of breath and stridor.   Cardiovascular: Negative.  Negative for chest pain, palpitations and leg swelling.  Gastrointestinal: Negative.  Negative for nausea, vomiting, abdominal pain, diarrhea and constipation.  Endocrine: Negative.   Genitourinary: Negative.   Musculoskeletal: Negative.  Negative for back pain, arthralgias and neck pain.  Skin: Negative.  Negative for color change and rash.   Allergic/Immunologic: Negative.   Neurological: Negative.  Negative for dizziness, tremors, weakness, light-headedness and numbness.  Hematological: Negative.  Does not bruise/bleed easily.  Psychiatric/Behavioral: Negative.     Objective:  BP 106/80 mmHg  Pulse 100  Temp(Src) 98.4 F (36.9 C) (Oral)  Resp 16  Ht 5\' 8"  (1.727 m)  Wt 270 lb (122.471 kg)  BMI 41.06 kg/m2  SpO2 96%  BP Readings from Last 3 Encounters:  12/29/15 106/80  11/26/15 138/78  11/18/15 140/98    Wt Readings from Last 3 Encounters:  12/29/15 270 lb (122.471 kg)  11/18/15 262 lb 4 oz (118.956 kg)  11/18/14 253 lb 12 oz (115.1 kg)    Physical Exam  Constitutional: He is oriented to person, place, and time. He appears well-developed and well-nourished. No distress.  HENT:  Mouth/Throat: Oropharynx is clear and moist. No oropharyngeal exudate.  Eyes: Conjunctivae are normal. Right eye exhibits no discharge. Left eye exhibits no discharge. No scleral icterus.  Neck: Normal range of motion. Neck supple. No JVD present. No tracheal deviation present. No thyromegaly present.  Cardiovascular: Normal rate, regular rhythm, normal heart sounds and intact distal pulses.  Exam reveals no gallop and no friction rub.   No murmur heard. EKG ---  Sinus  Rhythm  WITHIN NORMAL LIMITS  Pulmonary/Chest: Effort normal and breath sounds normal. No stridor. No respiratory distress. He has no wheezes. He has no rales. He exhibits no tenderness.  Abdominal: Soft. Bowel sounds are normal. He exhibits no distension and no mass. There is no tenderness.  There is no rebound and no guarding.  Musculoskeletal: Normal range of motion. He exhibits no edema or tenderness.  Lymphadenopathy:    He has no cervical adenopathy.  Neurological: He is oriented to person, place, and time.  Skin: Skin is warm and dry. No rash noted. He is not diaphoretic. No erythema. No pallor.  Psychiatric: He has a normal mood and affect. His behavior is  normal. Judgment and thought content normal.  Vitals reviewed.   Lab Results  Component Value Date   WBC 8.9 07/15/2014   HGB 14.2 07/15/2014   HCT 42.4 07/15/2014   PLT 299.0 07/15/2014   GLUCOSE 93 07/15/2014   CHOL 210* 07/15/2014   TRIG 118.0 07/15/2014   HDL 43.80 07/15/2014   LDLDIRECT 147.6 08/29/2012   LDLCALC 143* 07/15/2014   ALT 30 07/15/2014   AST 21 07/15/2014   NA 139 07/15/2014   K 4.1 07/15/2014   CL 103 07/15/2014   CREATININE 0.9 07/15/2014   BUN 14 07/15/2014   CO2 20 07/15/2014   TSH 2.34 07/15/2014   PSA 2.10 07/15/2014   INR 0.94 01/30/2014   HGBA1C 5.5 07/15/2014    Mr Cervical Spine Wo Contrast  12/13/2014  CLINICAL DATA:  Neck pain and right shoulder pain. Right hand numbness. EXAM: MRI CERVICAL SPINE WITHOUT CONTRAST TECHNIQUE: Multiplanar, multisequence MR imaging of the cervical spine was performed. No intravenous contrast was administered. COMPARISON:  CT scan of the neck dated 02/27/2014 FINDINGS: The visualized intracranial contents, paraspinal soft tissues, and cervical spinal cord. No facet arthritis in the cervical spine. C1-2 and C2-3:  Normal. C3-4: Minimal uncinate osteophytes create moderate bilateral foraminal narrowing. C4-5:  Slight bilateral foraminal narrowing. C5-6: Disc space narrowing. Osteophytes create moderate bilateral foraminal narrowing. C6-7: Disc space narrowing.  No foraminal narrowing. C7-T1 and T1-2:  Normal. IMPRESSION: 1. Multilevel degenerative disc disease with moderate foraminal narrowing at C3-4 and C5-6 and slight bilateral foraminal narrowing at C4-5. 2. No disc protrusions or discrete neural impingement. No significant change since the prior neck CT dated 02/27/2014. Electronically Signed   By: Lorriane Shire M.D.   On: 12/13/2014 10:10    Assessment & Plan:   [place] was seen today for hypertension.  Diagnoses and all orders for this visit:  Essential hypertension- his systolic blood pressure is a little too low  and he feels symptomatic with a combination of an ARB and hydrochlorothiazide, will discontinue the ARB and will continue hydrochlorothiazide. -     EKG 12-Lead -     Discontinue: hydrochlorothiazide (MICROZIDE) 12.5 MG capsule; Take 1 capsule (12.5 mg total) by mouth daily. -     hydrochlorothiazide (MICROZIDE) 12.5 MG capsule; Take 1 capsule (12.5 mg total) by mouth daily.  Morbid obesity due to excess calories ([place])- will try Belviq to help him to decrease his caloric intake and lose weight, he will do this in addition to lifestyle modifications. -     Lorcaserin HCl ER (BELVIQ XR) 20 MG TB24; Take 1 tablet by mouth daily.  Need for Tdap vaccination -     Tdap vaccine greater than or equal to 7yo IM   I have discontinued Mr. Sentell's aspirin EC and Azilsartan Medoxomil. I am also having him start on Lorcaserin HCl ER. Additionally, I am having him maintain his omeprazole, fluticasone, and hydrochlorothiazide.  Meds ordered this encounter  Medications  . DISCONTD: hydrochlorothiazide (MICROZIDE) 12.5 MG capsule    Sig: Take 1 capsule (12.5 mg total) by mouth daily.  Dispense:  90 capsule    Refill:  1  . hydrochlorothiazide (MICROZIDE) 12.5 MG capsule    Sig: Take 1 capsule (12.5 mg total) by mouth daily.    Dispense:  90 capsule    Refill:  1  . Lorcaserin HCl ER (BELVIQ XR) 20 MG TB24    Sig: Take 1 tablet by mouth daily.    Dispense:  30 tablet    Refill:  5     Follow-up: Return in about 3 months (around 03/29/2016).  Scarlette Calico, MD

## 2015-12-29 NOTE — Progress Notes (Signed)
Pre visit review using our clinic review tool, if applicable. No additional management support is needed unless otherwise documented below in the visit note. 

## 2015-12-29 NOTE — Patient Instructions (Signed)
Hypertension Hypertension, commonly called high blood pressure, is when the force of blood pumping through your arteries is too strong. Your arteries are the blood vessels that carry blood from your heart throughout your body. A blood pressure reading consists of a higher number over a lower number, such as 110/72. The higher number (systolic) is the pressure inside your arteries when your heart pumps. The lower number (diastolic) is the pressure inside your arteries when your heart relaxes. Ideally you want your blood pressure below 120/80. Hypertension forces your heart to work harder to pump blood. Your arteries may become narrow or stiff. Having untreated or uncontrolled hypertension can cause heart attack, stroke, kidney disease, and other problems. RISK FACTORS Some risk factors for high blood pressure are controllable. Others are not.  Risk factors you cannot control include:   Race. You may be at higher risk if you are African American.  Age. Risk increases with age.  Gender. Men are at higher risk than women before age 45 years. After age 65, women are at higher risk than men. Risk factors you can control include:  Not getting enough exercise or physical activity.  Being overweight.  Getting too much fat, sugar, calories, or salt in your diet.  Drinking too much alcohol. SIGNS AND SYMPTOMS Hypertension does not usually cause signs or symptoms. Extremely high blood pressure (hypertensive crisis) may cause headache, anxiety, shortness of breath, and nosebleed. DIAGNOSIS To check if you have hypertension, your health care provider will measure your blood pressure while you are seated, with your arm held at the level of your heart. It should be measured at least twice using the same arm. Certain conditions can cause a difference in blood pressure between your right and left arms. A blood pressure reading that is higher than normal on one occasion does not mean that you need treatment. If  it is not clear whether you have high blood pressure, you may be asked to return on a different day to have your blood pressure checked again. Or, you may be asked to monitor your blood pressure at home for 1 or more weeks. TREATMENT Treating high blood pressure includes making lifestyle changes and possibly taking medicine. Living a healthy lifestyle can help lower high blood pressure. You may need to change some of your habits. Lifestyle changes may include:  Following the DASH diet. This diet is high in fruits, vegetables, and whole grains. It is low in salt, red meat, and added sugars.  Keep your sodium intake below 2,300 mg per day.  Getting at least 30-45 minutes of aerobic exercise at least 4 times per week.  Losing weight if necessary.  Not smoking.  Limiting alcoholic beverages.  Learning ways to reduce stress. Your health care provider may prescribe medicine if lifestyle changes are not enough to get your blood pressure under control, and if one of the following is true:  You are 18-59 years of age and your systolic blood pressure is above 140.  You are 60 years of age or older, and your systolic blood pressure is above 150.  Your diastolic blood pressure is above 90.  You have diabetes, and your systolic blood pressure is over 140 or your diastolic blood pressure is over 90.  You have kidney disease and your blood pressure is above 140/90.  You have heart disease and your blood pressure is above 140/90. Your personal target blood pressure may vary depending on your medical conditions, your age, and other factors. HOME CARE INSTRUCTIONS    Have your blood pressure rechecked as directed by your health care provider.   Take medicines only as directed by your health care provider. Follow the directions carefully. Blood pressure medicines must be taken as prescribed. The medicine does not work as well when you skip doses. Skipping doses also puts you at risk for  problems.  Do not smoke.   Monitor your blood pressure at home as directed by your health care provider. SEEK MEDICAL CARE IF:   You think you are having a reaction to medicines taken.  You have recurrent headaches or feel dizzy.  You have swelling in your ankles.  You have trouble with your vision. SEEK IMMEDIATE MEDICAL CARE IF:  You develop a severe headache or confusion.  You have unusual weakness, numbness, or feel faint.  You have severe chest or abdominal pain.  You vomit repeatedly.  You have trouble breathing. MAKE SURE YOU:   Understand these instructions.  Will watch your condition.  Will get help right away if you are not doing well or get worse.   This information is not intended to replace advice given to you by your health care provider. Make sure you discuss any questions you have with your health care provider.   Document Released: 09/13/2005 Document Revised: 01/28/2015 Document Reviewed: 07/06/2013 Elsevier Interactive Patient Education 2016 [employer]  

## 2015-12-30 ENCOUNTER — Telehealth: Payer: Self-pay

## 2015-12-30 NOTE — Telephone Encounter (Signed)
PA initiated and APPROVED via CoverMyMeds Key [id number], pharmacy informed via fax

## 2015-12-30 NOTE — Telephone Encounter (Signed)
APPROVED through 03/29/2016

## 2016-04-02 NOTE — Telephone Encounter (Signed)
APPROVED 04/02/2016 - 04/02/2017, pharmacy advised via fax

## 2016-04-02 NOTE — Telephone Encounter (Signed)
Second PA initiated via CoverMyMeds key [facility]

## 2016-05-07 ENCOUNTER — Encounter: Payer: Self-pay | Admitting: Family

## 2016-05-07 ENCOUNTER — Other Ambulatory Visit (INDEPENDENT_AMBULATORY_CARE_PROVIDER_SITE_OTHER): Payer: BC Managed Care – PPO

## 2016-05-07 ENCOUNTER — Ambulatory Visit (INDEPENDENT_AMBULATORY_CARE_PROVIDER_SITE_OTHER): Payer: BC Managed Care – PPO | Admitting: Family

## 2016-05-07 VITALS — BP 162/100 | HR 98 | Temp 97.9°F | Resp 18 | Ht 68.0 in | Wt 267.0 lb

## 2016-05-07 DIAGNOSIS — K2100 Gastro-esophageal reflux disease with esophagitis, without bleeding: Secondary | ICD-10-CM

## 2016-05-07 DIAGNOSIS — I10 Essential (primary) hypertension: Secondary | ICD-10-CM

## 2016-05-07 LAB — COMPREHENSIVE METABOLIC PANEL
ALBUMIN: 4.2 g/dL (ref 3.5–5.2)
ALT: 47 U/L (ref 0–53)
AST: 26 U/L (ref 0–37)
Alkaline Phosphatase: 54 U/L (ref 39–117)
BUN: 13 mg/dL (ref 6–23)
CHLORIDE: 105 meq/L (ref 96–112)
CO2: 29 mEq/L (ref 19–32)
CREATININE: 0.96 mg/dL (ref 0.40–1.50)
Calcium: 9.6 mg/dL (ref 8.4–10.5)
GFR: 87.76 mL/min (ref >60.00)
Glucose, Bld: 101 mg/dL — ABNORMAL HIGH (ref 70–99)
POTASSIUM: 4.8 meq/L (ref 3.5–5.1)
Sodium: 140 mEq/L (ref 135–145)
Total Bilirubin: 0.5 mg/dL (ref 0.2–1.2)
Total Protein: 7.1 g/dL (ref 6.0–8.3)

## 2016-05-07 LAB — LIPASE: Lipase: 19 U/L (ref 11.0–59.0)

## 2016-05-07 LAB — H. PYLORI ANTIBODY, IGG: H Pylori IgG: NEGATIVE

## 2016-05-07 MED ORDER — METOPROLOL SUCCINATE ER 50 MG PO TB24: 50.0000 mg | ORAL_TABLET | Freq: Every day | ORAL | 1 refills | Status: DC

## 2016-05-07 MED ORDER — OMEPRAZOLE 40 MG PO CPDR: 40.0000 mg | DELAYED_RELEASE_CAPSULE | Freq: Every day | ORAL | 1 refills | Status: DC

## 2016-05-07 NOTE — Assessment & Plan Note (Signed)
Symptoms and exam consistent with exacerbation of gastroesophageal reflux most likely related to poor dietary choices and weight. Increase omeprazole to 40 mg daily. Obtain complete metabolic panel, H. pylori, and lipase. There is concern for possible gastritis/ulcer secondary to long-term use of nonsteroidal anti-inflammatories which may require a referral to gastroenterology for endoscopy. Treat conservatively at this time with medication and follow-up pending blood work.

## 2016-05-07 NOTE — Progress Notes (Signed)
Subjective:    Patient ID: Taylor Schroeder, male    DOB: October 22, 1965, 50 y.o.   MRN: [id number]  Chief Complaint  Patient presents with  . stomach issues    feeling bloated at times to where he feels uncomfortable, sometimes stomach pain, x1 month has has wheezing    HPI:  Taylor Schroeder is a 50 y.o. male who  has a past medical history of Anemia; Arthritis; Asthma; BPH (benign prostatic hyperplasia); Coronary artery disease (Non-obstructive); Crohn's ileitis - working dx (06/01/2013); Esophageal ring, acquired (07/12/2013); GERD (gastroesophageal reflux disease); H/O hiatal hernia; Obesity; and Pernicious anemia-B12 deficiency (04/18/2013). and presents today for an acute office visit.  1.) Bloating - This is a new problem. Associated symptoms of feeling bloated at time and some stomach pain. Describes that his stomach will "blow up like a balloon." Pain is generally in the epigastric region. Pain is described as uncomfortable and doesn't feel right. This does not occur with any meal or dependent upon the food type. Currently maintained on omeprazole for GERD. Notes that his bowel movement pattern varies and is random. Last bowel movement was last night. Denies hard stools. Nutritional intake consists of fast and processed food for 2 out of 3 meals per day on average.  2.) Wheezing - Associated symptom of wheezing has been going on for a couple of months and described as "getting strangled" when he eats. Described as feeling like he has something in his throat and is unable to get it out at times.     Allergies  Allergen Reactions  . Allegra [Fexofenadine Hcl] Hives  . Demerol [Meperidine] Nausea And Vomiting  . Fexofenadine Other (See Comments)    Unknown-reaction  . Hctz [Hydrochlorothiazide]     Weak and muscle cramps   . Meperidine Hcl     "bad things" happen when I take it     Current Outpatient Prescriptions on File Prior to Visit  Medication Sig Dispense Refill  .  fluticasone (FLONASE) 50 MCG/ACT nasal spray Place 2 sprays into both nostrils daily. 16 g 6  . Lorcaserin HCl ER (BELVIQ XR) 20 MG TB24 Take 1 tablet by mouth daily. 30 tablet 5   No current facility-administered medications on file prior to visit.      Past Surgical History:  Procedure Laterality Date  . APPENDECTOMY    . CARDIAC CATHETERIZATION  01/2013  . CARPAL TUNNEL RELEASE Right   . COLONOSCOPY    . ESOPHAGOGASTRODUODENOSCOPY    . INGUINAL HERNIA REPAIR  1966   "? side"  . KNEE ARTHROPLASTY Left 02/06/2014   Procedure: COMPUTER ASSISTED TOTAL KNEE ARTHROPLASTY;  Surgeon: [name], MD;  Location: [place];  Service: Orthopedics;  Laterality: Left;  Cemented Left Total Knee Arthroplasty  . LEFT HEART CATHETERIZATION WITH CORONARY ANGIOGRAM N/A 01/29/2013   Procedure: LEFT HEART CATHETERIZATION WITH CORONARY ANGIOGRAM;  Surgeon: Thayer Headings, MD;  Location: [facility] CATH LAB;  Service: Cardiovascular;  Laterality: N/A;  . SHOULDER ARTHROSCOPY Left 03/12/2013  . TOTAL KNEE ARTHROPLASTY Left 02/06/2014    Past Medical History:  Diagnosis Date  . Anemia   . Arthritis   . Asthma    "as a baby"  . BPH (benign prostatic hyperplasia)   . Coronary artery disease Non-obstructive   a. nonobs cath in 2006;  b. 01/2013 Cath: LM nl, LAD 53m, LCX nl, RCA non-dom, nl. EF 60-65%.  . Crohn's ileitis - working dx 06/01/2013  . Esophageal ring, acquired 07/12/2013  .  GERD (gastroesophageal reflux disease)   . H/O hiatal hernia    "think so" (02/06/2014)  . Obesity   . Pernicious anemia-B12 deficiency 04/18/2013    Review of Systems  Constitutional: Negative for chills and fever.  Respiratory: Positive for wheezing. Negative for chest tightness and shortness of breath.   Gastrointestinal: Positive for abdominal distention and abdominal pain. Negative for diarrhea, nausea and vomiting.  Neurological: Negative for numbness.      Objective:    BP (!) 162/100 (BP Location: Left Arm, Patient  Position: Sitting, Cuff Size: Large)   Pulse 98   Temp 97.9 F (36.6 C) (Oral)   Resp 18   Ht 5\' 8"  (1.727 m)   Wt 267 lb (121.1 kg)   SpO2 97%   BMI 40.60 kg/m  Nursing note and vital signs reviewed.  Physical Exam  Constitutional: He is oriented to person, place, and time. He appears well-developed and well-nourished. No distress.  Cardiovascular: Normal rate, regular rhythm, normal heart sounds and intact distal pulses.   Pulmonary/Chest: Effort normal and breath sounds normal.  Abdominal: Normal appearance and bowel sounds are normal. There is no hepatosplenomegaly. There is tenderness in the epigastric area. There is no rigidity, no rebound, no guarding, no tenderness at McBurney's point and negative Murphy's sign.  Neurological: He is alert and oriented to person, place, and time.  Skin: Skin is warm and dry.  Psychiatric: He has a normal mood and affect. His behavior is normal. Judgment and thought content normal.       Assessment & Plan:   Problem List Items Addressed This Visit      Cardiovascular and Mediastinum   Essential hypertension    Blood pressure remains uncontrolled and above goal 140/90 with current regimen. Patient compliance and self discontinuation is a challenge. Discussed importance of taking blood pressure medication to keep blood pressure below 140/90 on average. Refer to trial ARB, however patient was previously on Edarbi and self discontinued. Start metoprolol. Encouraged to monitor blood pressures at home. Decrease sodium in diet. Follow-up in 2 weeks for nurse visit to check blood pressure.      Relevant Medications   metoprolol succinate (TOPROL-XL) 50 MG 24 hr tablet     Digestive   GERD (gastroesophageal reflux disease) - Primary    Symptoms and exam consistent with exacerbation of gastroesophageal reflux most likely related to poor dietary choices and weight. Increase omeprazole to 40 mg daily. Obtain complete metabolic panel, H. pylori, and  lipase. There is concern for possible gastritis/ulcer secondary to long-term use of nonsteroidal anti-inflammatories which may require a referral to gastroenterology for endoscopy. Treat conservatively at this time with medication and follow-up pending blood work.      Relevant Medications   omeprazole (PRILOSEC) 40 MG capsule   Other Relevant Orders   Lipase   H. pylori antibody, IgG   Comprehensive metabolic panel    Other Visit Diagnoses   None.      I have discontinued Mr. Abila's omeprazole and hydrochlorothiazide. I am also having him start on metoprolol succinate and omeprazole. Additionally, I am having him maintain his fluticasone and Lorcaserin HCl ER.   Meds ordered this encounter  Medications  . metoprolol succinate (TOPROL-XL) 50 MG 24 hr tablet    Sig: Take 1 tablet (50 mg total) by mouth daily. Take with or immediately following a meal.    Dispense:  30 tablet    Refill:  1    Order Specific Question:   Supervising Provider  Answer:   Pricilla Holm A [id number]  . omeprazole (PRILOSEC) 40 MG capsule    Sig: Take 1 capsule (40 mg total) by mouth daily.    Dispense:  30 capsule    Refill:  1    Order Specific Question:   Supervising Provider    Answer:   Pricilla Holm A [id number]     Follow-up: Return in about 2 weeks (around 05/21/2016), or if symptoms worsen or fail to improve, for Blood pressure check.  Mauricio Po, FNP

## 2016-05-07 NOTE — Assessment & Plan Note (Addendum)
Blood pressure remains uncontrolled and above goal 140/90 with current regimen. Patient compliance and self discontinuation is a challenge. Discussed importance of taking blood pressure medication to keep blood pressure below 140/90 on average. Refer to trial ARB, however patient was previously on Edarbi and self discontinued. Start metoprolol. Encouraged to monitor blood pressures at home. Decrease sodium in diet. Follow-up in 2 weeks for nurse visit to check blood pressure.

## 2016-05-07 NOTE — Patient Instructions (Addendum)
Thank you for choosing [facility].  Summary/Instructions:  Please increase her omeprazole to 40 mg daily.  Work to decrease the amount of processed/sugary foods in your diet.  Consider a probiotic such as Culturelle or align.  Please start taking the Metoprolol daily and check blood pressures at home.    Your prescription(s) have been submitted to your pharmacy or been printed and provided for you. Please take as directed and contact our office if you believe you are having problem(s) with the medication(s) or have any questions.  Please stop by the lab on the lower level of the building for your blood work. Your results will be released to [place] (or called to you) after review, usually within 72 hours after test completion. If any changes need to be made, you will be notified at that same time.  1. The lab is open from 7:30am to 5:30 pm Monday-Friday  2. No appointment is necessary  3. Fasting (if needed) is 6-8 hours after food and drink; black coffee and water  are okay   If your symptoms worsen or fail to improve, please contact our office for further instruction, or in case of emergency go directly to the emergency room at the closest medical facility.    Food Choices for Gastroesophageal Reflux Disease, Adult When you have gastroesophageal reflux disease (GERD), the foods you eat and your eating habits are very important. Choosing the right foods can help ease the discomfort of GERD. WHAT GENERAL GUIDELINES DO I NEED TO FOLLOW?  Choose fruits, vegetables, whole grains, low-fat dairy products, and low-fat meat, fish, and poultry.  Limit fats such as oils, salad dressings, butter, nuts, and avocado.  Keep a food diary to identify foods that cause symptoms.  Avoid foods that cause reflux. These may be different for different people.  Eat frequent small meals instead of three large meals each day.  Eat your meals slowly, in a relaxed setting.  Limit fried  foods.  Cook foods using methods other than frying.  Avoid drinking alcohol.  Avoid drinking large amounts of liquids with your meals.  Avoid bending over or lying down until 2-3 hours after eating. WHAT FOODS ARE NOT RECOMMENDED? The following are some foods and drinks that may worsen your symptoms: Vegetables Tomatoes. Tomato juice. Tomato and spaghetti sauce. Chili peppers. Onion and garlic. Horseradish. Fruits Oranges, grapefruit, and lemon (fruit and juice). Meats High-fat meats, fish, and poultry. This includes hot dogs, ribs, ham, sausage, salami, and bacon. Dairy Whole milk and chocolate milk. Sour cream. Cream. Butter. Ice cream. Cream cheese.  Beverages Coffee and tea, with or without caffeine. Carbonated beverages or energy drinks. Condiments Hot sauce. Barbecue sauce.  Sweets/Desserts Chocolate and cocoa. Donuts. Peppermint and spearmint. Fats and Oils High-fat foods, including [place] fries and potato chips. Other Vinegar. Strong spices, such as black pepper, white pepper, red pepper, cayenne, curry powder, cloves, ginger, and chili powder. The items listed above may not be a complete list of foods and beverages to avoid. Contact your dietitian for more information.   This information is not intended to replace advice given to you by your health care provider. Make sure you discuss any questions you have with your health care provider.   Document Released: 09/13/2005 Document Revised: 10/04/2014 Document Reviewed: 07/18/2013 Elsevier Interactive Patient Education [employer].

## 2016-06-10 ENCOUNTER — Encounter: Payer: Self-pay | Admitting: Family Medicine

## 2016-06-10 ENCOUNTER — Ambulatory Visit (INDEPENDENT_AMBULATORY_CARE_PROVIDER_SITE_OTHER): Payer: BC Managed Care – PPO | Admitting: Family Medicine

## 2016-06-10 VITALS — BP 138/90 | HR 92 | Temp 97.6°F | Wt 260.4 lb

## 2016-06-10 DIAGNOSIS — J069 Acute upper respiratory infection, unspecified: Secondary | ICD-10-CM

## 2016-06-10 DIAGNOSIS — K120 Recurrent oral aphthae: Secondary | ICD-10-CM

## 2016-06-10 DIAGNOSIS — K1379 Other lesions of oral mucosa: Secondary | ICD-10-CM | POA: Diagnosis not present

## 2016-06-10 MED ORDER — FLUOCINONIDE 0.05 % EX GEL: CUTANEOUS | 0 refills | Status: DC

## 2016-06-10 NOTE — Progress Notes (Signed)
Subjective:    Patient ID: Taylor Schroeder, male    DOB: 30-Apr-1966, 50 y.o.   MRN: [id number]  HPI  Taylor Schroeder is a 50 year old male who presents today with sinus pressure/congestion present for 2 days.  Associated symptom rhinitis with clear drainage and nonproductive cough. Denies fever, chills, sweats, ear pain, tooth pain, itchy/watery eyes, and sore throat.  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  No history of asthma/bronchitis, or recent antibiotic use. No treatment at home at this time.   He reports "biting his tongue" six days ago.  He reports this as painful and has interfered with his drinking and eating. He tried OTC canker sore treatment which has not provided any benefit. He denies any additional lesions in other areas of his mouth, fever, chills, or sweats. Aggravating factors are eating and drinking. No alleviating factors noted.  HTN: BP retake 138/90 and pulse rate is 92.  He denies any chest palpitations, chest pain, SOB, headache, nosebleeds, numbness, tingling, or weakness, or edema. He reports taking metoprolol just prior to this appointment. He monitors his BP at home and his systolic range is in the [id number] and diastolic range is in the [id number] and has not exceeded 90.    Review of Systems  Constitutional: Negative for chills, fatigue and fever.  HENT: Positive for congestion, postnasal drip, rhinorrhea and sinus pressure.        Sore on right side of tongue  Respiratory: Positive for cough. Negative for shortness of breath and wheezing.   Cardiovascular: Negative for chest pain and palpitations.  Gastrointestinal: Negative for abdominal pain, diarrhea, nausea and vomiting.  Musculoskeletal: Negative for myalgias.  Skin: Negative for rash.  Neurological: Negative for dizziness and headaches.   Past Medical History:  Diagnosis Date  . Anemia   . Arthritis   . Asthma    "as a baby"  . BPH  (benign prostatic hyperplasia)   . Coronary artery disease Non-obstructive   a. nonobs cath in 2006;  b. 01/2013 Cath: LM nl, LAD 21m, LCX nl, RCA non-dom, nl. EF 60-65%.  . Crohn's ileitis - working dx 06/01/2013  . Esophageal ring, acquired 07/12/2013  . GERD (gastroesophageal reflux disease)   . H/O hiatal hernia    "think so" (02/06/2014)  . Obesity   . Pernicious anemia-B12 deficiency 04/18/2013     Social History   Social History  . Marital status: Married    Spouse name: N/A  . Number of children: 2  . Years of education: N/A   Occupational History  . [employer]   Social History Main Topics  . Smoking status: Former Smoker    Packs/day: 2.00    Years: 30.00    Types: Cigarettes    Quit date: 08/29/2008  . Smokeless tobacco: Never Used  . Alcohol use No     Comment: 02/06/2014 "couple beers a couple times/month"  . Drug use: No  . Sexual activity: Yes   Other Topics Concern  . Not on file   Social History Narrative   Married, 2 daughters   Biochemist, clinical    Past Surgical History:  Procedure Laterality Date  . APPENDECTOMY    . CARDIAC CATHETERIZATION  01/2013  . CARPAL TUNNEL RELEASE Right   . COLONOSCOPY    . ESOPHAGOGASTRODUODENOSCOPY    . INGUINAL HERNIA REPAIR  1966   "? side"  . KNEE ARTHROPLASTY Left 02/06/2014  Procedure: COMPUTER ASSISTED TOTAL KNEE ARTHROPLASTY;  Surgeon: [name], MD;  Location: [facility];  Service: Orthopedics;  Laterality: Left;  Cemented Left Total Knee Arthroplasty  . LEFT HEART CATHETERIZATION WITH CORONARY ANGIOGRAM N/A 01/29/2013   Procedure: LEFT HEART CATHETERIZATION WITH CORONARY ANGIOGRAM;  Surgeon: Thayer Headings, MD;  Location: [facility] C[facility] LAB;  Service: Cardiovascular;  Laterality: N/A;  . SHOULDER ARTHROSCOPY Left 03/12/2013  . TOTAL KNEE ARTHROPLASTY Left 02/06/2014    Family History  Problem Relation Age of Onset  . Hypertension Mother   . Cancer Father     type unknown  . COPD Other   . Emphysema Maternal  Grandmother   . Emphysema Maternal Grandfather   . Other Paternal Grandmother     spinal cancer  . Alcohol abuse Neg Hx   . Diabetes Neg Hx   . Early death Neg Hx   . Heart disease Neg Hx   . Hyperlipidemia Neg Hx   . Kidney disease Neg Hx   . Stroke Neg Hx     Allergies  Allergen Reactions  . Allegra [Fexofenadine Hcl] Hives  . Demerol [Meperidine] Nausea And Vomiting  . Fexofenadine Other (See Comments)    Unknown-reaction  . Hctz [Hydrochlorothiazide]     Weak and muscle cramps   . Meperidine Hcl     "bad things" happen when I take it    Current Outpatient Prescriptions on File Prior to Visit  Medication Sig Dispense Refill  . Lorcaserin HCl ER (BELVIQ XR) 20 MG TB24 Take 1 tablet by mouth daily. 30 tablet 5  . metoprolol succinate (TOPROL-XL) 50 MG 24 hr tablet Take 1 tablet (50 mg total) by mouth daily. Take with or immediately following a meal. 30 tablet 1  . omeprazole (PRILOSEC) 40 MG capsule Take 1 capsule (40 mg total) by mouth daily. 30 capsule 1  . fluticasone (FLONASE) 50 MCG/ACT nasal spray Place 2 sprays into both nostrils daily. (Patient not taking: Reported on 06/10/2016) 16 g 6   No current facility-administered medications on file prior to visit.     BP 138/90   Pulse 92   Temp 97.6 F (36.4 C) (Oral)   Wt 260 lb 6.4 oz (118.1 kg)   SpO2 97%   BMI 39.59 kg/m    Objective:   Physical Exam  Constitutional: He is oriented to person, place, and time. He appears well-developed and well-nourished.  HENT:  Right Ear: Tympanic membrane normal.  Left Ear: Tympanic membrane normal.  Nose: Rhinorrhea present. Right sinus exhibits maxillary sinus tenderness. Right sinus exhibits no frontal sinus tenderness. Left sinus exhibits maxillary sinus tenderness. Left sinus exhibits no frontal sinus tenderness.  Mouth/Throat: Mucous membranes are normal. No oropharyngeal exudate or posterior oropharyngeal erythema.  Trauma from bite on the right side of tongue with  round shallow ulcer with a grey base suggestive of an  aphthous ulcer present next to area of bite.   Eyes: Pupils are equal, round, and reactive to light. No scleral icterus.  Neck: Neck supple.  Cardiovascular: Normal rate and regular rhythm.   Pulmonary/Chest: Effort normal and breath sounds normal. He has no wheezes.  Abdominal: Soft. Bowel sounds are normal. There is no rebound.  Lymphadenopathy:    He has cervical adenopathy.  Neurological: He is alert and oriented to person, place, and time.  Skin: Skin is warm and dry. No rash noted.      Assessment & Plan:  1. Aphthous ulcer of mouth Shallow, round lesion with a  grey base next to area of trauma from bite on tongue suggests aphthous ulcer. Advised patient to use a thin layer of fluocinonide gel until symptoms have improved. Advised him that this is used sparingly and discontinue when symptoms have improved. - fluocinonide gel (LIDEX) 0.05 %; Apply to affected area QID until symptoms have resolved. Do not apply this to a cold sore. Use a thin layer  Dispense: 15 g; Refill: 0  2. Sore in mouth Healing area of trauma from bite on right side of tongue. Will treat aphthous ulcer that is next to this as this is the area that patient describes as painful.  3. Acute upper respiratory infection Exam and history support viral respiratory infection. Advised patient to follow up for further evaluation and treatment if symptoms do not improve with conservative measures, worsen, or he develops a fever >100. Written instructions provided for symptomatic treatment.  Delano Metz, FNP-C

## 2016-06-10 NOTE — Progress Notes (Signed)
Pre visit review using our clinic review tool, if applicable. No additional management support is needed unless otherwise documented below in the visit note. 

## 2016-06-10 NOTE — Patient Instructions (Signed)
Please use gel on area in mouth as directed and follow up if symptoms do not improve, worsen, or you develop new symptoms in 3 to 4 days.   Upper Respiratory Infection, Adult Most upper respiratory infections (URIs) are a viral infection of the air passages leading to the lungs. A URI affects the nose, throat, and upper air passages. The most common type of URI is nasopharyngitis and is typically referred to as "the common cold." URIs run their course and usually go away on their own. Most of the time, a URI does not require medical attention, but sometimes a bacterial infection in the upper airways can follow a viral infection. This is called a secondary infection. Sinus and middle ear infections are common types of secondary upper respiratory infections. Bacterial pneumonia can also complicate a URI. A URI can worsen asthma and chronic obstructive pulmonary disease (COPD). Sometimes, these complications can require emergency medical care and may be life threatening.  CAUSES Almost all URIs are caused by viruses. A virus is a type of germ and can spread from one person to another.  RISKS FACTORS You may be at risk for a URI if:   You smoke.   You have chronic heart or lung disease.  You have a weakened defense (immune) system.   You are very young or very old.   You have nasal allergies or asthma.  You work in crowded or poorly ventilated areas.  You work in health care facilities or schools. SIGNS AND SYMPTOMS  Symptoms typically develop 2-3 days after you come in contact with a cold virus. Most viral URIs last 7-10 days. However, viral URIs from the influenza virus (flu virus) can last 14-18 days and are typically more severe. Symptoms may include:   Runny or stuffy (congested) nose.   Sneezing.   Cough.   Sore throat.   Headache.   Fatigue.   Fever.   Loss of appetite.   Pain in your forehead, behind your eyes, and over your cheekbones (sinus  pain).  Muscle aches.  DIAGNOSIS  Your health care provider may diagnose a URI by:  Physical exam.  Tests to check that your symptoms are not due to another condition such as:  Strep throat.  Sinusitis.  Pneumonia.  Asthma. TREATMENT  A URI goes away on its own with time. It cannot be cured with medicines, but medicines may be prescribed or recommended to relieve symptoms. Medicines may help:  Reduce your fever.  Reduce your cough.  Relieve nasal congestion. HOME CARE INSTRUCTIONS   Take medicines only as directed by your health care provider.   Gargle warm saltwater or take cough drops to comfort your throat as directed by your health care provider.  Use a warm mist humidifier or inhale steam from a shower to increase air moisture. This may make it easier to breathe.  Drink enough fluid to keep your urine clear or pale yellow.   Eat soups and other clear broths and maintain good nutrition.   Rest as needed.   Return to work when your temperature has returned to normal or as your health care provider advises. You may need to stay home longer to avoid infecting others. You can also use a face mask and careful hand washing to prevent spread of the virus.  Increase the usage of your inhaler if you have asthma.   Do not use any tobacco products, including cigarettes, chewing tobacco, or electronic cigarettes. If you need help quitting, ask your health  care provider. PREVENTION  The best way to protect yourself from getting a cold is to practice good hygiene.   Avoid oral or hand contact with people with cold symptoms.   Wash your hands often if contact occurs.  There is no clear evidence that vitamin C, vitamin E, echinacea, or exercise reduces the chance of developing a cold. However, it is always recommended to get plenty of rest, exercise, and practice good nutrition.  SEEK MEDICAL CARE IF:   You are getting worse rather than better.   Your symptoms are  not controlled by medicine.   You have chills.  You have worsening shortness of breath.  You have brown or red mucus.  You have yellow or brown nasal discharge.  You have pain in your face, especially when you bend forward.  You have a fever.  You have swollen neck glands.  You have pain while swallowing.  You have white areas in the back of your throat. SEEK IMMEDIATE MEDICAL CARE IF:   You have severe or persistent:  Headache.  Ear pain.  Sinus pain.  Chest pain.  You have chronic lung disease and any of the following:  Wheezing.  Prolonged cough.  Coughing up blood.  A change in your usual mucus.  You have a stiff neck.  You have changes in your:  Vision.  Hearing.  Thinking.  Mood. MAKE SURE YOU:   Understand these instructions.  Will watch your condition.  Will get help right away if you are not doing well or get worse.   This information is not intended to replace advice given to you by your health care provider. Make sure you discuss any questions you have with your health care provider.   Document Released: 03/09/2001 Document Revised: 01/28/2015 Document Reviewed: 12/19/2013 Elsevier Interactive Patient Education [employer].

## 2016-06-30 ENCOUNTER — Other Ambulatory Visit: Payer: Self-pay | Admitting: Family

## 2016-06-30 DIAGNOSIS — K2100 Gastro-esophageal reflux disease with esophagitis, without bleeding: Secondary | ICD-10-CM

## 2016-06-30 DIAGNOSIS — I10 Essential (primary) hypertension: Secondary | ICD-10-CM

## 2016-07-01 ENCOUNTER — Other Ambulatory Visit: Payer: Self-pay | Admitting: Family

## 2016-07-01 DIAGNOSIS — K2100 Gastro-esophageal reflux disease with esophagitis, without bleeding: Secondary | ICD-10-CM

## 2016-07-01 DIAGNOSIS — I10 Essential (primary) hypertension: Secondary | ICD-10-CM

## 2016-07-13 ENCOUNTER — Ambulatory Visit (INDEPENDENT_AMBULATORY_CARE_PROVIDER_SITE_OTHER)
Admission: RE | Admit: 2016-07-13 | Discharge: 2016-07-13 | Disposition: A | Discharge status: Home / Self Care | Payer: BC Managed Care – PPO | Source: Ambulatory Visit | Attending: Internal Medicine | Admitting: Internal Medicine

## 2016-07-13 ENCOUNTER — Encounter: Payer: Self-pay | Admitting: Internal Medicine

## 2016-07-13 ENCOUNTER — Ambulatory Visit (INDEPENDENT_AMBULATORY_CARE_PROVIDER_SITE_OTHER): Payer: BC Managed Care – PPO | Admitting: Internal Medicine

## 2016-07-13 VITALS — BP 130/80 | HR 89 | Temp 97.7°F | Resp 16 | Wt 267.0 lb

## 2016-07-13 DIAGNOSIS — K2100 Gastro-esophageal reflux disease with esophagitis, without bleeding: Secondary | ICD-10-CM

## 2016-07-13 DIAGNOSIS — R059 Cough, unspecified: Secondary | ICD-10-CM

## 2016-07-13 DIAGNOSIS — R05 Cough: Secondary | ICD-10-CM

## 2016-07-13 DIAGNOSIS — K222 Esophageal obstruction: Secondary | ICD-10-CM | POA: Diagnosis not present

## 2016-07-13 MED ORDER — HYDROCODONE-HOMATROPINE 5-1.5 MG/5ML PO SYRP: 5.0000 mL | ORAL_SOLUTION | Freq: Three times a day (TID) | ORAL | 0 refills | Status: DC | PRN

## 2016-07-13 NOTE — Progress Notes (Signed)
Pre visit review using our clinic review tool, if applicable. No additional management support is needed unless otherwise documented below in the visit note. 
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
## 2016-07-13 NOTE — Patient Instructions (Signed)

## 2016-07-14 ENCOUNTER — Encounter: Payer: Self-pay | Admitting: Internal Medicine

## 2016-08-27 HISTORY — PX: KNEE ARTHROSCOPY: SHX127

## 2016-09-08 ENCOUNTER — Encounter: Payer: Self-pay | Admitting: Internal Medicine

## 2016-09-08 ENCOUNTER — Ambulatory Visit (INDEPENDENT_AMBULATORY_CARE_PROVIDER_SITE_OTHER): Payer: BC Managed Care – PPO | Admitting: Internal Medicine

## 2016-09-08 VITALS — BP 142/86 | HR 96 | Ht 68.0 in | Wt 272.5 lb

## 2016-09-08 DIAGNOSIS — K219 Gastro-esophageal reflux disease without esophagitis: Secondary | ICD-10-CM

## 2016-09-08 DIAGNOSIS — R1319 Other dysphagia: Secondary | ICD-10-CM

## 2016-09-08 DIAGNOSIS — K529 Noninfective gastroenteritis and colitis, unspecified: Secondary | ICD-10-CM

## 2016-09-08 DIAGNOSIS — R1310 Dysphagia, unspecified: Secondary | ICD-10-CM | POA: Diagnosis not present

## 2016-09-08 MED ORDER — PANTOPRAZOLE SODIUM 40 MG PO TBEC: 40.0000 mg | DELAYED_RELEASE_TABLET | Freq: Every day | ORAL | 3 refills | Status: DC

## 2016-09-08 NOTE — Progress Notes (Signed)
Taylor Schroeder 50 y.o. 03-30-1966 [id number]  Referred by: Janith Lima, MD [address], [place] [address]  Assessment & Plan:   Encounter Diagnoses  Name Primary?  . Esophageal dysphagia Yes  . Gastroesophageal reflux disease, esophagitis presence not specified   . Ileitis- hx of     Has dysphagia to solids and some ? Strangling/aspiration. Hx of ring dilation in past  EGD and dilation will be scheduled Change PPI to pantoprazole GER diet/lifestyle changes discussed and handout provided  The risks and benefits as well as alternatives of endoscopic procedure(s) have been discussed and reviewed. All questions answered. The patient agrees to proceed.  At this point I think he does not and has not had Crohn's disease but remains a possibility. He is w/o sig intestinal sxs w/ diet changes - observe - no meds  Cc;Scarlette Calico, MD   Subjective:   Chief Complaint: strangling on food  HPI 50 yo wm here today at request of Dr. Ronnald Ramp - has hx of GERD and ring dilation w/ 54Fr Maloney in 2014 - did well but over time having intermittent solid food dysphagia and episodes of strangling w/ ? Some aspiration sxs  Has been on omeprazole faithfully 40 mg qd Does use Caffeine 1/ pot coffee AM and 1 16-20 oz Mt Dew daily  Has gained weight  Wt Readings from Last 3 Encounters:  09/08/16 272 lb 8 oz (123.6 kg)  07/13/16 267 lb (121.1 kg)  06/10/16 260 lb 6.4 oz (118.1 kg)   Hx ileitis at colonoscopy to investigate diarrhea in 2014 - IBD serology negative  Controls diarrhea with diet - avoids nuts mainly and does not have problems if compliant  Allergies  Allergen Reactions  . Allegra [Fexofenadine Hcl] Hives  . Demerol [Meperidine] Nausea And Vomiting  . Fexofenadine Other (See Comments)    Unknown-reaction  . Hctz [Hydrochlorothiazide]     Weak and muscle cramps   . Meperidine Hcl     "bad things" happen when I take it   Outpatient Medications  Prior to Visit  Medication Sig Dispense Refill  . fluticasone (FLONASE) 50 MCG/ACT nasal spray Place 2 sprays into both nostrils daily. 16 g 6  . metoprolol succinate (TOPROL-XL) 50 MG 24 hr tablet TAKE 1 TABLET (50 MG TOTAL) BY MOUTH DAILY. TAKE WITH OR IMMEDIATELY FOLLOWING A MEAL. 90 tablet 1  . omeprazole (PRILOSEC) 40 MG capsule TAKE ONE CAPSULE EVERY DAY 90 capsule 1  . Lorcaserin HCl ER (BELVIQ XR) 20 MG TB24 Take 1 tablet by mouth daily. (Patient not taking: Reported on 09/08/2016) 30 tablet 5  . fluocinonide gel (LIDEX) 0.05 % Apply to affected area QID until symptoms have resolved. Do not apply this to a cold sore. Use a thin layer 15 g 0  . HYDROcodone-homatropine (HYCODAN) 5-1.5 MG/5ML syrup Take 5 mLs by mouth every 8 (eight) hours as needed for cough. 120 mL 0   No facility-administered medications prior to visit.    Past Medical History:  Diagnosis Date  . Anemia   . Arthritis   . Asthma    "as a baby"  . BPH (benign prostatic hyperplasia)   . Coronary artery disease Non-obstructive   a. nonobs cath in 2006;  b. 01/2013 Cath: LM nl, LAD 39m, LCX nl, RCA non-dom, nl. EF 60-65%.  . Crohn's ileitis - working dx 06/01/2013  . Esophageal ring, acquired 07/12/2013  . GERD (gastroesophageal reflux disease)   . H/O hiatal hernia    "  think so" (02/06/2014)  . Obesity   . Pernicious anemia-B12 deficiency 04/18/2013   Past Surgical History:  Procedure Laterality Date  . APPENDECTOMY    . CARDIAC CATHETERIZATION  01/2013  . CARPAL TUNNEL RELEASE Right   . COLONOSCOPY    . ESOPHAGOGASTRODUODENOSCOPY    . INGUINAL HERNIA REPAIR  1966   "? side"  . KNEE ARTHROPLASTY Left 02/06/2014   Procedure: COMPUTER ASSISTED TOTAL KNEE ARTHROPLASTY;  Surgeon: [name], MD;  Location: [facility];  Service: Orthopedics;  Laterality: Left;  Cemented Left Total Knee Arthroplasty  . LEFT HEART CATHETERIZATION WITH CORONARY ANGIOGRAM N/A 01/29/2013   Procedure: LEFT HEART CATHETERIZATION WITH CORONARY  ANGIOGRAM;  Surgeon: Thayer Headings, MD;  Location: [facility] CATH LAB;  Service: Cardiovascular;  Laterality: N/A;  . SHOULDER ARTHROSCOPY Left 03/12/2013  . TOTAL KNEE ARTHROPLASTY Left 02/06/2014   Social History   Social History  . Marital status: Married    Spouse name: N/A  . Number of children: 2  . Years of education: N/A   Occupational History  . [employer]   Social History Main Topics  . Smoking status: Former Smoker    Packs/day: 2.00    Years: 30.00    Types: Cigarettes    Quit date: 08/29/2008  . Smokeless tobacco: Never Used  . Alcohol use No     Comment: 02/06/2014 "couple beers a couple times/month"  . Drug use: No  . Sexual activity: Yes   Other Topics Concern  . None   Social History Narrative   Married, 2 daughters   Biochemist, clinical   Family History  Problem Relation Age of Onset  . Hypertension Mother   . Cancer Father     type unknown  . COPD Other   . Emphysema Maternal Grandmother   . Emphysema Maternal Grandfather   . Other Paternal Grandmother     spinal cancer  . Alcohol abuse Neg Hx   . Diabetes Neg Hx   . Early death Neg Hx   . Heart disease Neg Hx   . Hyperlipidemia Neg Hx   . Kidney disease Neg Hx   . Stroke Neg Hx   . Colon cancer Neg Hx   . Stomach cancer Neg Hx   . Esophageal cancer Neg Hx   . Rectal cancer Neg Hx   . Liver cancer Neg Hx        Review of Systems L knee pain - upcoming knee surgery to release scar tissue "right patellar clunk syndrome" - Dr. Lorin Mercy Has gained weight No chest pain All other ROS negative  Objective:   Physical Exam @BP  (!) 142/86   Pulse 96   Ht 5\' 8"  (1.727 m)   Wt 272 lb 8 oz (123.6 kg)   BMI 41.43 kg/m @  General:  Well-developed, well-nourished and in no acute distress Eyes:  anicteric.  Lungs: Clear to auscultation bilaterally. Heart:  S1S2, no rubs, murmurs, gallops. Abdomen:  obese, soft,  Mildly tender, epigastrium Extremities:   no cyanosis or  clubbing Neuro:  A&O x 3.  Psych:  appropriate mood and  Affect.   Data Reviewed:  As per HPI

## 2016-09-08 NOTE — Patient Instructions (Addendum)
   You have been scheduled for an endoscopy. Please follow written instructions given to you at your visit today. If you use inhalers (even only as needed), please bring them with you on the day of your procedure. Hold your diet pill for 10 days prior to your procedure please.  Dr Carlean Purl is changing you from omeprazole to pantoprazole .    We have sent the following medications to your pharmacy for you to pick up at your convenience: Pantoprazole   Today we are giving you a handout on GERD to read and follow.     I appreciate the opportunity to care for you. Taylor Schroeder, [facility]

## 2016-09-13 DIAGNOSIS — M25862 Other specified joint disorders, left knee: Secondary | ICD-10-CM | POA: Diagnosis not present

## 2016-09-16 ENCOUNTER — Other Ambulatory Visit: Payer: Self-pay | Admitting: Internal Medicine

## 2016-09-16 DIAGNOSIS — I10 Essential (primary) hypertension: Secondary | ICD-10-CM

## 2016-09-22 ENCOUNTER — Encounter (INDEPENDENT_AMBULATORY_CARE_PROVIDER_SITE_OTHER): Payer: Self-pay | Admitting: Orthopaedic Surgery

## 2016-09-22 ENCOUNTER — Ambulatory Visit (INDEPENDENT_AMBULATORY_CARE_PROVIDER_SITE_OTHER): Payer: BC Managed Care – PPO | Admitting: Orthopaedic Surgery

## 2016-09-22 VITALS — BP 145/98 | HR 83 | Ht 68.0 in | Wt 272.0 lb

## 2016-09-22 DIAGNOSIS — G8929 Other chronic pain: Secondary | ICD-10-CM

## 2016-09-22 DIAGNOSIS — M25562 Pain in left knee: Secondary | ICD-10-CM

## 2016-09-22 NOTE — Progress Notes (Signed)
Post-Op Visit Note   Patient: Taylor Schroeder           Date of Birth: [birth year]           MRN: [id number] Visit Date: 09/22/2016 PCP: Scarlette Calico, MD   Assessment & Plan: Patellar clunk syndrome left knee  Chief Complaint:  Chief Complaint  Patient presents with  . Left Knee - Routine Post Op    Left knee arthroscopy and debridement 09/13/16 9 days post op   Visit Diagnoses: No diagnosis found.  HPI: Patient presents today for routine post operative appointment. He is status post left total knee arthroscopy and debridement on 09/13/16. He is 9 days post op. He denies pain but complains of persistent popping with flexion and extension of knee. He states the popping does cause him soreness. He does not have any popping when going up the stairs. He also complains today of soreness at left wrist where IV was placed for surgery. With the extension and contraction he still has some grinding and popping which appears to be superior and lateral new the inflow cannula. He is able to walk up steps which he did not do but stills has some problems going down steps which is new.  Plan: He can resume work next Wednesday note given. I will recheck him in one month.  Follow-Up Instructions: No Follow-up on file.   Orders:  No orders of the defined types were placed in this encounter.  No orders of the defined types were placed in this encounter.    PMFS History: Patient Active Problem List   Diagnosis Date Noted  . Cough 07/13/2016  . Primary erectile dysfunction 11/18/2014  . Essential hypertension 07/15/2014  . DDD (degenerative disc disease), cervical 10/03/2013  . Esophageal ring, acquired 07/12/2013  . Crohn's ileitis - working dx 06/01/2013  . BPH (benign prostatic hyperplasia) 04/18/2013  . Pernicious anemia-B12 deficiency 04/18/2013  . Severe obesity (BMI >= 40) ([place]) 12/21/2012  . GERD (gastroesophageal reflux disease) 12/21/2012  . Hyperglycemia 08/29/2012  . Routine  general medical examination at a health care facility 08/29/2012   Past Medical History:  Diagnosis Date  . Anemia   . Arthritis   . Asthma    "as a baby"  . BPH (benign prostatic hyperplasia)   . Coronary artery disease Non-obstructive   a. nonobs cath in 2006;  b. 01/2013 Cath: LM nl, LAD 46m, LCX nl, RCA non-dom, nl. EF 60-65%.  . Crohn's ileitis - working dx 06/01/2013  . Esophageal ring, acquired 07/12/2013  . GERD (gastroesophageal reflux disease)   . H/O hiatal hernia    "think so" (02/06/2014)  . Obesity   . Pernicious anemia-B12 deficiency 04/18/2013    Family History  Problem Relation Age of Onset  . Hypertension Mother   . Cancer Father     type unknown  . COPD Other   . Emphysema Maternal Grandmother   . Emphysema Maternal Grandfather   . Other Paternal Grandmother     spinal cancer  . Alcohol abuse Neg Hx   . Diabetes Neg Hx   . Early death Neg Hx   . Heart disease Neg Hx   . Hyperlipidemia Neg Hx   . Kidney disease Neg Hx   . Stroke Neg Hx   . Colon cancer Neg Hx   . Stomach cancer Neg Hx   . Esophageal cancer Neg Hx   . Rectal cancer Neg Hx   . Liver cancer Neg Hx  Past Surgical History:  Procedure Laterality Date  . APPENDECTOMY    . CARDIAC CATHETERIZATION  01/2013  . CARPAL TUNNEL RELEASE Right   . COLONOSCOPY    . ESOPHAGOGASTRODUODENOSCOPY    . INGUINAL HERNIA REPAIR  1966   "? side"  . KNEE ARTHROPLASTY Left 02/06/2014   Procedure: COMPUTER ASSISTED TOTAL KNEE ARTHROPLASTY;  Surgeon: [name], MD;  Location: [facility];  Service: Orthopedics;  Laterality: Left;  Cemented Left Total Knee Arthroplasty  . LEFT HEART CATHETERIZATION WITH CORONARY ANGIOGRAM N/A 01/29/2013   Procedure: LEFT HEART CATHETERIZATION WITH CORONARY ANGIOGRAM;  Surgeon: Thayer Headings, MD;  Location: [facility] CATH LAB;  Service: Cardiovascular;  Laterality: N/A;  . SHOULDER ARTHROSCOPY Left 03/12/2013  . TOTAL KNEE ARTHROPLASTY Left 02/06/2014   Social History   Occupational  History  . [employer]   Social History Main Topics  . Smoking status: Former Smoker    Packs/day: 2.00    Years: 30.00    Types: Cigarettes    Quit date: 08/29/2008  . Smokeless tobacco: Never Used  . Alcohol use No     Comment: 02/06/2014 "couple beers a couple times/month"  . Drug use: No  . Sexual activity: Yes

## 2016-09-27 HISTORY — PX: MULTIPLE TOOTH EXTRACTIONS: SHX2053

## 2016-09-28 ENCOUNTER — Encounter (INDEPENDENT_AMBULATORY_CARE_PROVIDER_SITE_OTHER): Payer: Self-pay | Admitting: Orthopaedic Surgery

## 2016-09-28 ENCOUNTER — Ambulatory Visit (INDEPENDENT_AMBULATORY_CARE_PROVIDER_SITE_OTHER): Payer: BC Managed Care – PPO | Admitting: Orthopaedic Surgery

## 2016-09-28 VITALS — BP 151/109 | HR 82 | Ht 68.0 in | Wt 272.0 lb

## 2016-09-28 DIAGNOSIS — M1711 Unilateral primary osteoarthritis, right knee: Secondary | ICD-10-CM

## 2016-09-28 MED ORDER — LIDOCAINE HCL 1 % IJ SOLN
1.0000 mL | INTRAMUSCULAR | Status: AC | PRN
  Administered 2016-09-28: 1 mL

## 2016-09-28 MED ORDER — BUPIVACAINE HCL 0.25 % IJ SOLN
0.6600 mL | INTRAMUSCULAR | Status: AC | PRN
  Administered 2016-09-28: .66 mL via INTRA_ARTICULAR

## 2016-09-28 MED ORDER — METHYLPREDNISOLONE ACETATE 40 MG/ML IJ SUSP
40.0000 mg | INTRAMUSCULAR | Status: AC | PRN
  Administered 2016-09-28: 40 mg via INTRA_ARTICULAR

## 2016-09-28 NOTE — Progress Notes (Signed)
Office Visit Note   Patient: Taylor Schroeder           Date of Birth: 1965/12/15           MRN: [id number] Visit Date: 09/28/2016              Requested by: Janith Lima, MD [address] [place], [place] [address] PCP: Scarlette Calico, MD   Assessment & Plan: Visit Diagnoses:  1. Unilateral primary osteoarthritis, right knee     Plan: Patient is having increased problems with right knee synovitis. Left knee post knee arthroscopy for patellar clunk syndrome is improved. He can walk up steps without catching and without pain. Injection performed with good relief of her workup walking program exercising weight loss. He can return in 3 months. Follow-Up Instructions: Return in about 3 months (around 12/27/2016).   Orders:  No orders of the defined types were placed in this encounter.  No orders of the defined types were placed in this encounter.     Procedures: Large Joint Inj Date/Time: 09/28/2016 11:44 AM Performed by: [name] Authorized by: [name]   Consent Given by:  Patient Site marked: the procedure site was marked   Indications:  Pain and joint swelling Location:  Knee Site:  R knee Needle Size:  22 G Needle Length:  1.5 inches Approach:  Anterolateral Ultrasound Guidance: No   Fluoroscopic Guidance: No   Arthrogram: No   Medications:  1 mL lidocaine 1 %; 40 mg methylPREDNISolone acetate 40 MG/ML; 0.66 mL bupivacaine 0.25 % Patient tolerance:  Patient tolerated the procedure well with no immediate complications     Clinical Data: No additional findings.   Subjective: Chief Complaint  Patient presents with  . Right Knee - Pain    Patient comes in today with complaint of right knee pain. He requests injection.    Review of Systems unchanged from last month the preoperative visit other than increased right knee synovitis with aching.   Objective: Vital Signs: BP (!) 151/109   Pulse 82   Ht 5\' 8"  (1.727 m)   Wt 272 lb (123.4  kg)   BMI 41.36 kg/m   Physical Exam  Constitutional: He is oriented to person, place, and time. He appears well-developed and well-nourished.  HENT:  Head: Normocephalic and atraumatic.  Eyes: EOM are normal. Pupils are equal, round, and reactive to light.  Neck: No tracheal deviation present. No thyromegaly present.  Cardiovascular: Normal rate.   Pulmonary/Chest: Effort normal. He has no wheezes.  Abdominal: Soft. Bowel sounds are normal.  Musculoskeletal:  Right knee 3+ synovitis and medial and lateral joint line tenderness. Crepitus with knee extension. Hip range of motion is full name straight leg raising 90. Knee and ankle jerk are 2+ and symmetrical. Negative Homans no rash or exposed skin. Pedal pulses are normal.  Neurological: He is alert and oriented to person, place, and time.  Skin: Skin is warm and dry. Capillary refill takes less than 2 seconds.  Psychiatric: He has a normal mood and affect. His behavior is normal. Judgment and thought content normal.    Ortho Exam well-healed left total knee arthroplasty incision. With resisted extension he still has some crepitus which appears to be superior laterals in the suprapatellar pouch. The loud patellar clunk is no longer present. Well-healed knee arthroscopic portals. Right knee has some pain with range of motion and that 2-3+ synovitis.  Specialty Comments:  No specialty comments available.  Imaging:  No results found.   PMFS History: Patient Active Problem List   Diagnosis Date Noted  . Cough 07/13/2016  . Primary erectile dysfunction 11/18/2014  . Essential hypertension 07/15/2014  . DDD (degenerative disc disease), cervical 10/03/2013  . Esophageal ring, acquired 07/12/2013  . Crohn's ileitis - working dx 06/01/2013  . BPH (benign prostatic hyperplasia) 04/18/2013  . Pernicious anemia-B12 deficiency 04/18/2013  . Severe obesity (BMI >= 40) ([place]) 12/21/2012  . GERD (gastroesophageal reflux disease) 12/21/2012    . Hyperglycemia 08/29/2012  . Routine general medical examination at a health care facility 08/29/2012   Past Medical History:  Diagnosis Date  . Anemia   . Arthritis   . Asthma    "as a baby"  . BPH (benign prostatic hyperplasia)   . Coronary artery disease Non-obstructive   a. nonobs cath in 2006;  b. 01/2013 Cath: LM nl, LAD 48m, LCX nl, RCA non-dom, nl. EF 60-65%.  . Crohn's ileitis - working dx 06/01/2013  . Esophageal ring, acquired 07/12/2013  . GERD (gastroesophageal reflux disease)   . H/O hiatal hernia    "think so" (02/06/2014)  . Obesity   . Pernicious anemia-B12 deficiency 04/18/2013    Family History  Problem Relation Age of Onset  . Hypertension Mother   . Cancer Father     type unknown  . COPD Other   . Emphysema Maternal Grandmother   . Emphysema Maternal Grandfather   . Other Paternal Grandmother     spinal cancer  . Alcohol abuse Neg Hx   . Diabetes Neg Hx   . Early death Neg Hx   . Heart disease Neg Hx   . Hyperlipidemia Neg Hx   . Kidney disease Neg Hx   . Stroke Neg Hx   . Colon cancer Neg Hx   . Stomach cancer Neg Hx   . Esophageal cancer Neg Hx   . Rectal cancer Neg Hx   . Liver cancer Neg Hx     Past Surgical History:  Procedure Laterality Date  . APPENDECTOMY    . CARDIAC CATHETERIZATION  01/2013  . CARPAL TUNNEL RELEASE Right   . COLONOSCOPY    . ESOPHAGOGASTRODUODENOSCOPY    . INGUINAL HERNIA REPAIR  1966   "? side"  . KNEE ARTHROPLASTY Left 02/06/2014   Procedure: COMPUTER ASSISTED TOTAL KNEE ARTHROPLASTY;  Surgeon: [name], MD;  Location: [facility];  Service: Orthopedics;  Laterality: Left;  Cemented Left Total Knee Arthroplasty  . LEFT HEART CATHETERIZATION WITH CORONARY ANGIOGRAM N/A 01/29/2013   Procedure: LEFT HEART CATHETERIZATION WITH CORONARY ANGIOGRAM;  Surgeon: Thayer Headings, MD;  Location: [facility] CATH LAB;  Service: Cardiovascular;  Laterality: N/A;  . SHOULDER ARTHROSCOPY Left 03/12/2013  . TOTAL KNEE ARTHROPLASTY Left 02/06/2014    Social History   Occupational History  . [employer]   Social History Main Topics  . Smoking status: Former Smoker    Packs/day: 2.00    Years: 30.00    Types: Cigarettes    Quit date: 08/29/2008  . Smokeless tobacco: Never Used  . Alcohol use No     Comment: 02/06/2014 "couple beers a couple times/month"  . Drug use: No  . Sexual activity: Yes

## 2016-10-14 ENCOUNTER — Encounter: Payer: Self-pay | Admitting: Internal Medicine

## 2016-10-22 ENCOUNTER — Ambulatory Visit (INDEPENDENT_AMBULATORY_CARE_PROVIDER_SITE_OTHER): Payer: BC Managed Care – PPO | Admitting: Orthopaedic Surgery

## 2016-10-26 ENCOUNTER — Encounter: Payer: BC Managed Care – PPO | Admitting: Internal Medicine

## 2016-12-14 ENCOUNTER — Other Ambulatory Visit (INDEPENDENT_AMBULATORY_CARE_PROVIDER_SITE_OTHER): Payer: BC Managed Care – PPO

## 2016-12-14 ENCOUNTER — Ambulatory Visit (INDEPENDENT_AMBULATORY_CARE_PROVIDER_SITE_OTHER): Payer: BC Managed Care – PPO | Admitting: Internal Medicine

## 2016-12-14 ENCOUNTER — Encounter: Payer: Self-pay | Admitting: Internal Medicine

## 2016-12-14 DIAGNOSIS — R1013 Epigastric pain: Secondary | ICD-10-CM | POA: Diagnosis not present

## 2016-12-14 DIAGNOSIS — R109 Unspecified abdominal pain: Secondary | ICD-10-CM | POA: Insufficient documentation

## 2016-12-14 DIAGNOSIS — R112 Nausea with vomiting, unspecified: Secondary | ICD-10-CM

## 2016-12-14 LAB — CBC WITH DIFFERENTIAL/PLATELET
BASOS ABS: 0.1 10*3/uL (ref 0.0–0.1)
Basophils Relative: 1 % (ref 0.0–3.0)
Eosinophils Absolute: 0.2 10*3/uL (ref 0.0–0.7)
Eosinophils Relative: 2 % (ref 0.0–5.0)
HCT: 43.9 % (ref 39.0–52.0)
Hemoglobin: 15.2 g/dL (ref 13.0–17.0)
LYMPHS ABS: 3.1 10*3/uL (ref 0.7–4.0)
Lymphocytes Relative: 39.9 % (ref 12.0–46.0)
MCHC: 34.6 g/dL (ref 30.0–36.0)
MCV: 88.3 fl (ref 78.0–100.0)
MONOS PCT: 11.3 % (ref 3.0–12.0)
Monocytes Absolute: 0.9 10*3/uL (ref 0.1–1.0)
NEUTROS PCT: 45.8 % (ref 43.0–77.0)
Neutro Abs: 3.6 10*3/uL (ref 1.4–7.7)
Platelets: 258 10*3/uL (ref 150.0–400.0)
RBC: 4.98 Mil/uL (ref 4.22–5.81)
RDW: 13.2 % (ref 11.5–15.5)
WBC: 7.8 10*3/uL (ref 4.0–10.5)

## 2016-12-14 LAB — URINALYSIS
Bilirubin Urine: NEGATIVE
HGB URINE DIPSTICK: NEGATIVE
Ketones, ur: NEGATIVE
Leukocytes, UA: NEGATIVE
Nitrite: NEGATIVE
Specific Gravity, Urine: 1.02 (ref 1.000–1.030)
Total Protein, Urine: NEGATIVE
UROBILINOGEN UA: 0.2 (ref 0.0–1.0)
Urine Glucose: NEGATIVE
pH: 6.5 (ref 5.0–8.0)

## 2016-12-14 LAB — LIPASE: Lipase: 16 U/L (ref 11.0–59.0)

## 2016-12-14 LAB — BASIC METABOLIC PANEL
BUN: 16 mg/dL (ref 6–23)
CHLORIDE: 104 meq/L (ref 96–112)
CO2: 26 meq/L (ref 19–32)
Calcium: 9.6 mg/dL (ref 8.4–10.5)
Creatinine, Ser: 0.98 mg/dL (ref 0.40–1.50)
GFR: 85.49 mL/min (ref >60.00)
Glucose, Bld: 94 mg/dL (ref 70–99)
POTASSIUM: 3.9 meq/L (ref 3.5–5.1)
Sodium: 138 mEq/L (ref 135–145)

## 2016-12-14 LAB — SEDIMENTATION RATE: Sed Rate: 18 mm/hr (ref 0–20)

## 2016-12-14 LAB — MAGNESIUM: MAGNESIUM: 2 mg/dL (ref 1.5–2.5)

## 2016-12-14 MED ORDER — ONDANSETRON HCL 4 MG PO TABS: 4.0000 mg | ORAL_TABLET | Freq: Three times a day (TID) | ORAL | 0 refills | Status: DC | PRN

## 2016-12-14 MED ORDER — ONDANSETRON 4 MG PO TBDP: 4.0000 mg | ORAL_TABLET | Freq: Once | ORAL | Status: DC

## 2016-12-14 MED ORDER — KETOROLAC TROMETHAMINE 10 MG PO TABS: 10.0000 mg | ORAL_TABLET | Freq: Three times a day (TID) | ORAL | 0 refills | Status: DC | PRN

## 2016-12-14 NOTE — Addendum Note (Signed)
Addended by: Valere Dross on: 12/14/2016 04:25 PM   Modules accepted: Orders

## 2016-12-14 NOTE — Assessment & Plan Note (Signed)
Labs CT To ER if worse Toradol prn Zofran

## 2016-12-14 NOTE — Assessment & Plan Note (Signed)
Zofran

## 2016-12-14 NOTE — Progress Notes (Signed)
Subjective:  Patient ID: Taylor Schroeder, male    DOB: 11-15-1965  Age: 51 y.o. MRN: [id number]  CC: Abdominal Pain (RUQ pain nausea, sharp dull pain, heartburn, belching, )   HPI Taylor Schroeder presents for pain in the epig area irrad in the back since Sun.  Upper back was hurting since Sat. Pain was 10/10 at times. Achy. Vomited once  Outpatient Medications Prior to Visit  Medication Sig Dispense Refill  . metoprolol succinate (TOPROL-XL) 50 MG 24 hr tablet TAKE 1 TABLET (50 MG TOTAL) BY MOUTH DAILY. TAKE WITH OR IMMEDIATELY FOLLOWING A MEAL. 90 tablet 1  . pantoprazole (PROTONIX) 40 MG tablet Take 1 tablet (40 mg total) by mouth daily before breakfast. 90 tablet 3  . omeprazole (PRILOSEC) 40 MG capsule Take 40 mg by mouth daily.  1  . acetaminophen (TYLENOL ARTHRITIS PAIN) 650 MG CR tablet Take 650 mg by mouth daily.    . fluticasone (FLONASE) 50 MCG/ACT nasal spray Place 2 sprays into both nostrils daily. 16 g 6  . hydrochlorothiazide (MICROZIDE) 12.5 MG capsule TAKE 1 CAPSULE (12.5 MG TOTAL) BY MOUTH DAILY. (Patient not taking: Reported on 09/22/2016) 90 capsule 1  . Lorcaserin HCl ER (BELVIQ XR) 20 MG TB24 Take 1 tablet by mouth daily. 30 tablet 5   No facility-administered medications prior to visit.     ROS Review of Systems  Constitutional: Positive for chills. Negative for appetite change, fatigue and unexpected weight change.  HENT: Negative for congestion, nosebleeds, sneezing, sore throat and trouble swallowing.   Eyes: Negative for itching and visual disturbance.  Respiratory: Negative for cough.   Cardiovascular: Negative for chest pain, palpitations and leg swelling.  Gastrointestinal: Positive for abdominal distention, abdominal pain, nausea and vomiting. Negative for blood in stool and diarrhea.  Genitourinary: Negative for frequency and hematuria.  Musculoskeletal: Positive for back pain. Negative for gait problem, joint swelling and neck pain.  Skin: Negative  for color change and rash.  Neurological: Negative for dizziness, tremors, speech difficulty and weakness.  Psychiatric/Behavioral: Negative for agitation, dysphoric mood and sleep disturbance. The patient is not nervous/anxious.     Objective:  BP (!) 146/100   Pulse 79   Temp 98.1 F (36.7 C) (Oral)   Resp 16   Ht 5\' 8"  (1.727 m)   Wt 263 lb 8 oz (119.5 kg)   SpO2 92%   BMI 40.07 kg/m   BP Readings from Last 3 Encounters:  12/14/16 (!) 146/100  09/28/16 (!) 151/109  09/22/16 (!) 145/98    Wt Readings from Last 3 Encounters:  12/14/16 263 lb 8 oz (119.5 kg)  09/28/16 272 lb (123.4 kg)  09/22/16 272 lb (123.4 kg)    Physical Exam  Constitutional: He is oriented to person, place, and time. He appears well-developed. No distress.  NAD  HENT:  Mouth/Throat: Oropharynx is clear and moist.  Eyes: Conjunctivae are normal. Pupils are equal, round, and reactive to light.  Neck: Normal range of motion. No JVD present. No thyromegaly present.  Cardiovascular: Normal rate, regular rhythm, normal heart sounds and intact distal pulses.  Exam reveals no gallop and no friction rub.   No murmur heard. Pulmonary/Chest: Effort normal and breath sounds normal. No respiratory distress. He has no wheezes. He has no rales. He exhibits no tenderness.  Abdominal: Soft. Bowel sounds are normal. He exhibits distension. He exhibits no mass. There is tenderness. There is no rebound and no guarding.  Musculoskeletal: Normal range of motion. He exhibits  no edema or tenderness.  Lymphadenopathy:    He has no cervical adenopathy.  Neurological: He is alert and oriented to person, place, and time. He has normal reflexes. No cranial nerve deficit. He exhibits normal muscle tone. He displays a negative Romberg sign. Coordination and gait normal.  Skin: Skin is warm and dry. No rash noted.  Psychiatric: He has a normal mood and affect. His behavior is normal. Judgment and thought content normal.  Epig area  and upper 1/2 abd - tender No jaundice  Lab Results  Component Value Date   WBC 8.9 07/15/2014   HGB 14.2 07/15/2014   HCT 42.4 07/15/2014   PLT 299.0 07/15/2014   GLUCOSE 101 (H) 05/07/2016   CHOL 210 (H) 07/15/2014   TRIG 118.0 07/15/2014   HDL 43.80 07/15/2014   LDLDIRECT 147.6 08/29/2012   LDLCALC 143 (H) 07/15/2014   ALT 47 05/07/2016   AST 26 05/07/2016   NA 140 05/07/2016   K 4.8 05/07/2016   CL 105 05/07/2016   CREATININE 0.96 05/07/2016   BUN 13 05/07/2016   CO2 29 05/07/2016   TSH 2.34 07/15/2014   PSA 2.10 07/15/2014   INR 0.94 01/30/2014   HGBA1C 5.5 07/15/2014    Dg Chest 2 View  Result Date: 07/14/2016 CLINICAL DATA:  Cough x 3 days. Possible aspiration. Hx of HTN. Ex smoker. Unable to raise left arm for lateral. EXAM: CHEST  2 VIEW COMPARISON:  01/30/2014 FINDINGS: The heart size and mediastinal contours are within normal limits. Both lungs are clear. The visualized skeletal structures are unremarkable. IMPRESSION: No active cardiopulmonary disease. Electronically Signed   By: Nolon Nations M.D.   On: 07/14/2016 08:22    Assessment & Plan:   There are no diagnoses linked to this encounter. I have discontinued Mr. Dewing's fluticasone, Lorcaserin HCl ER, acetaminophen, hydrochlorothiazide, and omeprazole. I am also having him maintain his metoprolol succinate, pantoprazole, and ibuprofen.  Meds ordered this encounter  Medications  . ibuprofen (ADVIL,MOTRIN) 200 MG tablet    Sig: Take 200 mg by mouth every 6 (six) hours as needed.     Follow-up: No Follow-up on file.  Walker Kehr, MD

## 2016-12-14 NOTE — Patient Instructions (Signed)
Go to ER if worse Clear liquids for now

## 2016-12-14 NOTE — Progress Notes (Signed)
Pre-visit discussion using our clinic review tool. No additional management support is needed unless otherwise documented below in the visit note.  

## 2016-12-15 ENCOUNTER — Ambulatory Visit (INDEPENDENT_AMBULATORY_CARE_PROVIDER_SITE_OTHER)
Admission: RE | Admit: 2016-12-15 | Discharge: 2016-12-15 | Disposition: A | Discharge status: Home / Self Care | Payer: BC Managed Care – PPO | Source: Ambulatory Visit | Attending: Internal Medicine | Admitting: Internal Medicine

## 2016-12-15 ENCOUNTER — Other Ambulatory Visit (INDEPENDENT_AMBULATORY_CARE_PROVIDER_SITE_OTHER): Payer: BC Managed Care – PPO

## 2016-12-15 ENCOUNTER — Telehealth: Payer: Self-pay

## 2016-12-15 DIAGNOSIS — R1013 Epigastric pain: Secondary | ICD-10-CM | POA: Diagnosis not present

## 2016-12-15 LAB — HEPATIC FUNCTION PANEL
ALT: 43 U/L (ref 0–53)
AST: 27 U/L (ref 0–37)
Albumin: 4.2 g/dL (ref 3.5–5.2)
Alkaline Phosphatase: 59 U/L (ref 39–117)
Bilirubin, Direct: 0.1 mg/dL (ref 0.0–0.3)
TOTAL PROTEIN: 7.2 g/dL (ref 6.0–8.3)
Total Bilirubin: 0.6 mg/dL (ref 0.2–1.2)

## 2016-12-15 MED ORDER — IOPAMIDOL (ISOVUE-300) INJECTION 61%
100.0000 mL | Freq: Once | INTRAVENOUS | Status: AC | PRN
  Administered 2016-12-15: 100 mL via INTRAVENOUS

## 2016-12-15 NOTE — Telephone Encounter (Signed)
error 

## 2016-12-16 ENCOUNTER — Telehealth: Payer: Self-pay | Admitting: Internal Medicine

## 2016-12-16 NOTE — Telephone Encounter (Signed)
It depends how he is doing: if well - he can go back to work on Monday Thx

## 2016-12-16 NOTE — Telephone Encounter (Signed)
Routing to dr [employer], you saw this patient on 3/20---please advise, I will him back, thanks

## 2016-12-16 NOTE — Telephone Encounter (Signed)
Pt wants to know if he is suppose to be out of work till he comes back to see Dr Ronnald Ramp on the 27 th

## 2016-12-17 DIAGNOSIS — R112 Nausea with vomiting, unspecified: Secondary | ICD-10-CM | POA: Diagnosis not present

## 2016-12-17 MED ORDER — ONDANSETRON HCL 4 MG/2ML IJ SOLN
4.0000 mg | Freq: Once | INTRAMUSCULAR | Status: AC
  Administered 2016-12-17: 4 mg via INTRAMUSCULAR

## 2016-12-17 NOTE — Addendum Note (Signed)
Addended by: Valere Dross on: 12/17/2016 09:25 AM   Modules accepted: Orders

## 2016-12-17 NOTE — Telephone Encounter (Signed)
Left message advising patient of dr plotnikovs note

## 2016-12-21 ENCOUNTER — Ambulatory Visit (INDEPENDENT_AMBULATORY_CARE_PROVIDER_SITE_OTHER): Payer: BC Managed Care – PPO | Admitting: Internal Medicine

## 2016-12-21 ENCOUNTER — Other Ambulatory Visit (INDEPENDENT_AMBULATORY_CARE_PROVIDER_SITE_OTHER): Payer: BC Managed Care – PPO

## 2016-12-21 ENCOUNTER — Encounter: Payer: Self-pay | Admitting: Internal Medicine

## 2016-12-21 VITALS — BP 150/110 | HR 100 | Temp 98.1°F | Resp 16 | Ht 68.0 in | Wt 265.5 lb

## 2016-12-21 DIAGNOSIS — D510 Vitamin B12 deficiency anemia due to intrinsic factor deficiency: Secondary | ICD-10-CM

## 2016-12-21 DIAGNOSIS — R7989 Other specified abnormal findings of blood chemistry: Secondary | ICD-10-CM | POA: Diagnosis not present

## 2016-12-21 DIAGNOSIS — I10 Essential (primary) hypertension: Secondary | ICD-10-CM

## 2016-12-21 DIAGNOSIS — K8020 Calculus of gallbladder without cholecystitis without obstruction: Secondary | ICD-10-CM | POA: Diagnosis not present

## 2016-12-21 DIAGNOSIS — E785 Hyperlipidemia, unspecified: Secondary | ICD-10-CM

## 2016-12-21 DIAGNOSIS — Z0000 Encounter for general adult medical examination without abnormal findings: Secondary | ICD-10-CM

## 2016-12-21 LAB — COMPREHENSIVE METABOLIC PANEL
ALK PHOS: 66 U/L (ref 39–117)
ALT: 41 U/L (ref 0–53)
AST: 23 U/L (ref 0–37)
Albumin: 4.4 g/dL (ref 3.5–5.2)
BUN: 13 mg/dL (ref 6–23)
CO2: 29 mEq/L (ref 19–32)
CREATININE: 1.09 mg/dL (ref 0.40–1.50)
Calcium: 9.7 mg/dL (ref 8.4–10.5)
Chloride: 102 mEq/L (ref 96–112)
GFR: 75.61 mL/min (ref >60.00)
Glucose, Bld: 104 mg/dL — ABNORMAL HIGH (ref 70–99)
Potassium: 4.2 mEq/L (ref 3.5–5.1)
SODIUM: 138 meq/L (ref 135–145)
TOTAL PROTEIN: 7.1 g/dL (ref 6.0–8.3)
Total Bilirubin: 0.6 mg/dL (ref 0.2–1.2)

## 2016-12-21 LAB — LIPID PANEL
Cholesterol: 227 mg/dL — ABNORMAL HIGH (ref 0–200)
HDL: 41.8 mg/dL (ref >39.00)
NONHDL: 185.54
Total CHOL/HDL Ratio: 5
Triglycerides: 270 mg/dL — ABNORMAL HIGH (ref 0.0–149.0)
VLDL: 54 mg/dL — AB (ref 0.0–40.0)

## 2016-12-21 LAB — FOLATE: FOLATE: 12.9 ng/mL (ref >5.9)

## 2016-12-21 LAB — PSA: PSA: 2.49 ng/mL (ref 0.10–4.00)

## 2016-12-21 LAB — AMYLASE: Amylase: 33 U/L (ref 27–131)

## 2016-12-21 LAB — LIPASE: Lipase: 21 U/L (ref 11.0–59.0)

## 2016-12-21 LAB — LDL CHOLESTEROL, DIRECT: LDL DIRECT: 144 mg/dL

## 2016-12-21 LAB — VITAMIN B12: VITAMIN B 12: 401 pg/mL (ref 211–911)

## 2016-12-21 NOTE — Patient Instructions (Signed)

## 2016-12-21 NOTE — Progress Notes (Signed)
Subjective:  Patient ID: Taylor Schroeder, male    DOB: 1966/08/13  Age: 51 y.o. MRN: [id number]  CC: Annual Exam; Hypertension; and Abdominal Pain   HPI Taylor Schroeder presents for a CPX.  He complains of recurrent episodes of right upper quadrant pain with nausea and recently had a CT scan that revealed cholelithiasis. He has a long-standing history of alternating constipation and diarrhea but no worsening of those symptoms recently. He has maintained a normal appetite, has not vomited, has not lost weight, and has not noticed any blood in his stool. The CT scan also showed calcifications in the pancreas indicating concern for chronic pancreatitis and possible exocrine pancreatic insufficiency.  Outpatient Medications Prior to Visit  Medication Sig Dispense Refill  . metoprolol succinate (TOPROL-XL) 50 MG 24 hr tablet TAKE 1 TABLET (50 MG TOTAL) BY MOUTH DAILY. TAKE WITH OR IMMEDIATELY FOLLOWING A MEAL. 90 tablet 1  . pantoprazole (PROTONIX) 40 MG tablet Take 1 tablet (40 mg total) by mouth daily before breakfast. 90 tablet 3  . ibuprofen (ADVIL,MOTRIN) 200 MG tablet Take 200 mg by mouth every 6 (six) hours as needed.    Marland Kitchen ketorolac (TORADOL) 10 MG tablet Take 1 tablet (10 mg total) by mouth every 8 (eight) hours as needed. 12 tablet 0  . ondansetron (ZOFRAN) 4 MG tablet Take 1 tablet (4 mg total) by mouth every 8 (eight) hours as needed for nausea or vomiting. 12 tablet 0   No facility-administered medications prior to visit.     ROS Review of Systems  Constitutional: Negative for activity change, appetite change, chills, diaphoresis, fatigue, fever and unexpected weight change.  HENT: Negative.  Negative for sinus pressure, sore throat and trouble swallowing.   Eyes: Negative.   Respiratory: Negative.  Negative for cough, chest tightness, shortness of breath and wheezing.   Cardiovascular: Negative for chest pain, palpitations and leg swelling.  Gastrointestinal: Positive for  abdominal pain, diarrhea and nausea. Negative for abdominal distention, anal bleeding, blood in stool, rectal pain and vomiting.  Endocrine: Negative.   Genitourinary: Negative.  Negative for decreased urine volume, difficulty urinating, dysuria, flank pain, frequency, hematuria, penile swelling, scrotal swelling, testicular pain and urgency.  Musculoskeletal: Negative.  Negative for back pain, myalgias and neck pain.  Skin: Negative.  Negative for color change and rash.  Allergic/Immunologic: Negative.   Neurological: Negative.   Hematological: Negative for adenopathy. Does not bruise/bleed easily.    Objective:  BP (!) 150/110 (BP Location: Left Arm, Patient Position: Sitting, Cuff Size: Large)   Pulse 100   Temp 98.1 F (36.7 C) (Oral)   Resp 16   Ht 5\' 8"  (1.727 m)   Wt 265 lb 8 oz (120.4 kg)   SpO2 96%   BMI 40.37 kg/m   BP Readings from Last 3 Encounters:  12/21/16 (!) 150/110  12/14/16 (!) 146/100  09/28/16 (!) 151/109    Wt Readings from Last 3 Encounters:  12/21/16 265 lb 8 oz (120.4 kg)  12/14/16 263 lb 8 oz (119.5 kg)  09/28/16 272 lb (123.4 kg)    Physical Exam  Constitutional: He is oriented to person, place, and time. No distress.  HENT:  Mouth/Throat: Oropharynx is clear and moist. No oropharyngeal exudate.  Eyes: Conjunctivae are normal. Right eye exhibits no discharge. Left eye exhibits no discharge. No scleral icterus.  Neck: Normal range of motion. Neck supple. No JVD present. No tracheal deviation present. No thyromegaly present.  Cardiovascular: Normal rate, regular rhythm, normal heart sounds  and intact distal pulses.  Exam reveals no gallop and no friction rub.   No murmur heard. Pulmonary/Chest: Effort normal and breath sounds normal. No stridor. No respiratory distress. He has no wheezes. He has no rales. He exhibits no tenderness.  Abdominal: Soft. Normal appearance and bowel sounds are normal. He exhibits no shifting dullness, no distension, no  abdominal bruit and no mass. There is no hepatosplenomegaly, splenomegaly or hepatomegaly. There is tenderness in the right upper quadrant and epigastric area. There is no rebound, no guarding and no CVA tenderness. No hernia. Hernia confirmed negative in the ventral area, confirmed negative in the right inguinal area and confirmed negative in the left inguinal area.  Genitourinary: Rectum normal, testes normal and penis normal. Rectal exam shows no external hemorrhoid, no internal hemorrhoid, no fissure, no mass, no tenderness, anal tone normal and guaiac negative stool. Prostate is enlarged (1+ smooth symm BPH). Prostate is not tender. Right testis shows no mass, no swelling and no tenderness. Right testis is descended. Left testis shows no mass, no swelling and no tenderness. Left testis is descended. Circumcised. No penile erythema or penile tenderness. No discharge found.  Musculoskeletal: Normal range of motion. He exhibits no edema, tenderness or deformity.  Lymphadenopathy:    He has no cervical adenopathy.       Right: No inguinal adenopathy present.       Left: No inguinal adenopathy present.  Neurological: He is oriented to person, place, and time.  Skin: Skin is warm and dry. No rash noted. He is not diaphoretic. No erythema. No pallor.  Vitals reviewed.   Lab Results  Component Value Date   WBC 7.8 12/14/2016   HGB 15.2 12/14/2016   HCT 43.9 12/14/2016   PLT 258.0 12/14/2016   GLUCOSE 104 (H) 12/21/2016   CHOL 227 (H) 12/21/2016   TRIG 270.0 (H) 12/21/2016   HDL 41.80 12/21/2016   LDLDIRECT 144.0 12/21/2016   LDLCALC 143 (H) 07/15/2014   ALT 41 12/21/2016   AST 23 12/21/2016   NA 138 12/21/2016   K 4.2 12/21/2016   CL 102 12/21/2016   CREATININE 1.09 12/21/2016   BUN 13 12/21/2016   CO2 29 12/21/2016   TSH 2.34 07/15/2014   PSA 2.49 12/21/2016   INR 0.94 01/30/2014   HGBA1C 5.5 07/15/2014    Ct Abdomen Pelvis W Contrast  Result Date: 12/15/2016 CLINICAL DATA:   Right-sided epigastric pain radiating to the back. Evaluate for pancreatitis. EXAM: CT ABDOMEN AND PELVIS WITH CONTRAST TECHNIQUE: Multidetector CT imaging of the abdomen and pelvis was performed using the standard protocol following bolus administration of intravenous contrast. CONTRAST:  152mL ISOVUE-300 IOPAMIDOL (ISOVUE-300) INJECTION 61% COMPARISON:  02/24/2010 FINDINGS: Lower chest: Tiny right pleural effusion and subsegmental atelectasis. Hepatobiliary: Diffuse hepatic steatosis.  Cholelithiasis. Pancreas: Punctate calcifications are seen within the pancreatic parenchyma. No solid mass. No evidence of inflammatory change. Spleen: Unremarkable Adrenals/Urinary Tract: Unremarkable kidneys and adrenal glands. Bladder is within normal limits. Stomach/Bowel: Stomach is unremarkable. No obvious mass in the colon. No evidence of small-bowel obstruction. Vascular/Lymphatic: No evidence of retroperitoneal adenopathy. Reproductive: Prostate is enlarged. Other: No free-fluid. Musculoskeletal: No vertebral compression. Lower lumbar degenerative disc disease. IMPRESSION: Cholelithiasis. Diffuse hepatic steatosis. Pancreatic calcifications are likely related to chronic pancreatitis. Tiny right pleural effusion and basilar atelectasis. Electronically Signed   By: [name] M.D.   On: 12/15/2016 15:29    Assessment & Plan:   Anikin was seen today for annual exam, hypertension and abdominal pain.  Diagnoses  and all orders for this visit:  Routine general medical examination at a health care facility- exam completed, labs ordered and reviewed, his colonoscopy is up-to-date, vaccines reviewed and updated, patient education material was given. -     Comprehensive metabolic panel; Future -     PSA; Future -     Lipid panel; Future  Pernicious anemia-B12 deficiency- he is maintaining a normal B12 level with no B12 replacement therapy, I will continue to monitor this. -     Vitamin B12; Future -     Folate;  Future  Symptomatic cholelithiasis- he tells me that he is getting adequate symptom relief with Zofran, will continue. His white cell count, LFTs, and pancreatic enzymes are normal so I am not concerned about an infected gallbladder or other complications. I have asked him to see general surgery urgently to consider having his gallbladder removed. -     Ambulatory referral to General Surgery -     Lipase; Future -     Amylase; Future  Hyperlipidemia with target LDL less than 100- his Framingham risk score is 10% so I've asked him to start taking an ARB, statin, and baby aspirin a day for cardiovascular risk reduction. -     rosuvastatin (CRESTOR) 10 MG tablet; Take 1 tablet (10 mg total) by mouth daily. -     aspirin EC 81 MG tablet; Take 1 tablet (81 mg total) by mouth daily.   I have discontinued Mr. Wingard's ibuprofen, ondansetron, and ketorolac. I am also having him start on rosuvastatin, aspirin EC, and telmisartan. Additionally, I am having him maintain his metoprolol succinate and pantoprazole.  Meds ordered this encounter  Medications  . rosuvastatin (CRESTOR) 10 MG tablet    Sig: Take 1 tablet (10 mg total) by mouth daily.    Dispense:  90 tablet    Refill:  3  . aspirin EC 81 MG tablet    Sig: Take 1 tablet (81 mg total) by mouth daily.    Dispense:  90 tablet    Refill:  3  . telmisartan (MICARDIS) 80 MG tablet    Sig: Take 1 tablet (80 mg total) by mouth daily.    Dispense:  90 tablet    Refill:  1     Follow-up: Return in about 1 week (around 12/28/2016).  Scarlette Calico, MD

## 2016-12-21 NOTE — Progress Notes (Signed)
Pre visit review using our clinic review tool, if applicable. No additional management support is needed unless otherwise documented below in the visit note. 

## 2016-12-22 ENCOUNTER — Encounter: Payer: Self-pay | Admitting: Internal Medicine

## 2016-12-22 DIAGNOSIS — E785 Hyperlipidemia, unspecified: Secondary | ICD-10-CM | POA: Insufficient documentation

## 2016-12-22 MED ORDER — ROSUVASTATIN CALCIUM 10 MG PO TABS: 10.0000 mg | ORAL_TABLET | Freq: Every day | ORAL | 3 refills | Status: DC

## 2016-12-22 MED ORDER — ASPIRIN EC 81 MG PO TBEC: 81.0000 mg | DELAYED_RELEASE_TABLET | Freq: Every day | ORAL | 3 refills | Status: DC

## 2016-12-26 MED ORDER — TELMISARTAN 80 MG PO TABS: 80.0000 mg | ORAL_TABLET | Freq: Every day | ORAL | 1 refills | Status: DC

## 2016-12-26 NOTE — Assessment & Plan Note (Signed)
His blood pressure is not well controlled on the beta blocker so I've asked him to add an ARB.

## 2016-12-28 ENCOUNTER — Ambulatory Visit (INDEPENDENT_AMBULATORY_CARE_PROVIDER_SITE_OTHER): Payer: BC Managed Care – PPO | Admitting: Orthopaedic Surgery

## 2017-01-03 ENCOUNTER — Telehealth: Payer: Self-pay | Admitting: Internal Medicine

## 2017-01-03 ENCOUNTER — Telehealth: Payer: Self-pay

## 2017-01-03 NOTE — Telephone Encounter (Signed)
Patient would like to know what the status is on FMLA (probably dropped off on 3/27).  Also dropped forms from [employer] and [employer] on the 4th. Patient states that employer is requesting forms today.  Do either of you have this?  Please follow up with patient in regard.  If we can not find form we need to get with FMLA to type a letter for employer today.

## 2017-01-03 NOTE — Telephone Encounter (Signed)
I have not seen this paperwork. Stef do you have it?

## 2017-01-04 NOTE — Telephone Encounter (Signed)
I have it and PCP has signed.

## 2017-01-04 NOTE — Telephone Encounter (Signed)
This has been faxed.

## 2017-01-05 NOTE — Telephone Encounter (Signed)
That was faxed Tuesday. I can fax all of them again. I will fax at the front fax to get a confirmation page.

## 2017-01-05 NOTE — Telephone Encounter (Signed)
Which forms were faxed? He said that the FMLA is needing to get to his employer as soon as possible and he can come by and pick up the others. He said that his wife has an appt with GI tomorrow and he can come by to get them then if they are ready.

## 2017-01-06 NOTE — Telephone Encounter (Signed)
The fax number on the form was to the main branch of [facility] [employer] and another location was needing this form---patient has picked up copy of what we were faxing and patient is going to carry form to that location needing form

## 2017-01-07 ENCOUNTER — Ambulatory Visit: Payer: Self-pay | Admitting: General Surgery

## 2017-01-07 NOTE — Telephone Encounter (Signed)
error 

## 2017-01-12 DIAGNOSIS — Z0289 Encounter for other administrative examinations: Secondary | ICD-10-CM

## 2017-01-27 ENCOUNTER — Telehealth: Payer: Self-pay | Admitting: Internal Medicine

## 2017-01-27 NOTE — Telephone Encounter (Signed)
Patient needs a statement letter saying that on March 20th he was taking out of work due to this gallbladder issues. I am unclear why he needs this. He has FMLA and Disability forms that been filled out. His surgeon as well as filled out papers. He states the form that they needed the surgeon put the wrong date on it. I informed him to follow up with them that they could fix the date. He states he will call them, But still needs a letter from [name]. Please follow up with patient. Thank you.

## 2017-01-27 NOTE — Pre-Procedure Instructions (Signed)
Taylor Schroeder  01/27/2017      [id number] - [place], [place] [address] AT [address] [place] [address] Phone: [phone] Fax: [phone]    Your procedure is scheduled on Thurs, May 10 @ 2:30 PM  Report to [facility] Admitting at 12:30 PM  Call this number if you have problems the morning of surgery:  [phone]   Remember:  Do not eat food or drink liquids after midnight.  Take these medicines the morning of surgery with A SIP OF WATER Pantoprazole(Protonix)             No Goody's,BC's,Aleve,Advil,Motrin,Ibuprofen,Fish Oil,or any Herbal Medications.    Do not wear jewelry.  Do not wear lotions, powders,colognes, or deoderant.  Men may shave face and neck.  Do not bring valuables to the hospital.  [facility] is not responsible for any belongings or valuables.  Contacts, dentures or bridgework may not be worn into surgery.  Leave your suitcase in the car.  After surgery it may be brought to your room.  For patients admitted to the hospital, discharge time will be determined by your treatment team.  Patients discharged the day of surgery will not be allowed to drive home.    [facility]  Before surgery, you can play an important role.  Because skin is not sterile, your skin needs to be as free of germs as possible.  You can reduce the number of germs on you skin by washing with CHG (chlorahexidine gluconate) soap before surgery.  CHG is an antiseptic cleaner which kills germs and bonds with the skin to continue killing germs even after washing.  Please DO NOT use if you have an allergy to CHG or antibacterial soaps.  If your skin becomes reddened/irritated stop using the CHG and inform your nurse when you arrive at Short Stay.  Do not shave (including legs and underarms) for at least 48 hours prior to the first CHG shower.  You may shave your  face.  Please follow these instructions carefully:   1.  Shower with CHG Soap the night before surgery and the                                morning of Surgery.  2.  If you choose to wash your hair, wash your hair first as usual with your       normal shampoo.  3.  After you shampoo, rinse your hair and body thoroughly to remove the                      Shampoo.  4.  Use CHG as you would any other liquid soap.  You can apply chg directly       to the skin and wash gently with scrungie or a clean washcloth.  5.  Apply the CHG Soap to your body ONLY FROM THE NECK DOWN.        Do not use on open wounds or open sores.  Avoid contact with your eyes,       ears, mouth and genitals (private parts).  Wash genitals (private parts)       with your normal soap.  6.  Wash thoroughly, paying special attention to the area where your surgery        will be performed.  7.  Thoroughly rinse your body with warm water from the neck down.  8.  DO NOT shower/wash with your normal soap after using and rinsing off       the CHG Soap.  9.  Pat yourself dry with a clean towel.            10.  Wear clean pajamas.            11.  Place clean sheets on your bed the night of your first shower and do not        sleep with pets.  Day of Surgery  Do not apply any lotions/deoderants the morning of surgery.  Please wear clean clothes to the hospital/surgery center.   Please read over the following fact sheets that you were given. Pain Booklet, Coughing and Deep Breathing and Surgical Site Infection Prevention

## 2017-01-28 ENCOUNTER — Encounter (HOSPITAL_COMMUNITY)
Admission: RE | Admit: 2017-01-28 | Discharge: 2017-01-28 | Disposition: A | Discharge status: Home / Self Care | Payer: BC Managed Care – PPO | Source: Ambulatory Visit | Attending: General Surgery | Admitting: General Surgery

## 2017-01-28 ENCOUNTER — Encounter (HOSPITAL_COMMUNITY): Payer: Self-pay

## 2017-01-28 DIAGNOSIS — I10 Essential (primary) hypertension: Secondary | ICD-10-CM | POA: Diagnosis present

## 2017-01-28 DIAGNOSIS — Z01812 Encounter for preprocedural laboratory examination: Secondary | ICD-10-CM | POA: Diagnosis not present

## 2017-01-28 DIAGNOSIS — Z01810 Encounter for preprocedural cardiovascular examination: Secondary | ICD-10-CM | POA: Diagnosis present

## 2017-01-28 HISTORY — DX: Headache: R51

## 2017-01-28 HISTORY — DX: Essential (primary) hypertension: I10

## 2017-01-28 HISTORY — DX: Nausea with vomiting, unspecified: Z98.890

## 2017-01-28 HISTORY — DX: Headache, unspecified: R51.9

## 2017-01-28 HISTORY — DX: Other specified postprocedural states: R11.2

## 2017-01-28 LAB — BASIC METABOLIC PANEL
Anion gap: 10 (ref 5–15)
BUN: 16 mg/dL (ref 6–20)
CALCIUM: 9.4 mg/dL (ref 8.9–10.3)
CO2: 23 mmol/L (ref 22–32)
CREATININE: 1.08 mg/dL (ref 0.61–1.24)
Chloride: 105 mmol/L (ref 101–111)
GFR calc non Af Amer: >60 mL/min (ref >60)
GLUCOSE: 116 mg/dL — AB (ref 65–99)
Potassium: 3.9 mmol/L (ref 3.5–5.1)
Sodium: 138 mmol/L (ref 135–145)

## 2017-01-28 LAB — CBC
HEMATOCRIT: 40.8 % (ref 39.0–52.0)
HEMOGLOBIN: 13.8 g/dL (ref 13.0–17.0)
MCH: 29.8 pg (ref 26.0–34.0)
MCHC: 33.8 g/dL (ref 30.0–36.0)
MCV: 88.1 fL (ref 78.0–100.0)
Platelets: 222 10*3/uL (ref 150–400)
RBC: 4.63 MIL/uL (ref 4.22–5.81)
RDW: 13.5 % (ref 11.5–15.5)
WBC: 7.4 10*3/uL (ref 4.0–10.5)

## 2017-01-28 NOTE — Progress Notes (Signed)
PCP is Dr. Janith Lima States he saw a cardiologist years ago- doesn't remember who. Card cath noted from 2014 States he is not sure if he had a stress test, but if he did in was 2014. Denies any chest pain, cough, or fever. Asked him about hx of CAD as noted in his history states that in 2006 he had a 40% blockage, but he stopped smoking in 2009, then in 2014 he was clear. Heart rate was 118 when he first came in today rechecked and is now 98.

## 2017-01-28 NOTE — Progress Notes (Signed)
   01/28/17 0908  OBSTRUCTIVE SLEEP APNEA  Score 5 or greater  Results sent to PCP

## 2017-01-31 NOTE — Telephone Encounter (Signed)
Patient has been informed.

## 2017-01-31 NOTE — Progress Notes (Signed)
Anesthesia Chart Review: Patient is a 51 year old male scheduled for laparoscopic cholecystectomy on 02/03/17 by Dr. [name].  Anesthesia Chart Review:  Patient is a 51 year old male scheduled for left TKA on 02/06/14 by Dr. Lorin Mercy.  History includes former smoker (quit '09), HTN, post-operative N/V, GERD, pernicious anemia, Crohn's ileitis, BPH, lower esophageal ring s/p dilation, arthritis, left TKA 02/06/14, appendectomy, minimal CAD '14. BMI is 40 consistent with morbid obesity.  - PCP is Dr. Scarlette Calico, last visit 12/21/16 for CPE. He referred patient to general surgery for consideration of cholecystectomy for symptomatic cholelithiasis. Telmisartan was added for HTN.  - GI is Dr. Carlean Purl.  - He was seen by cardiologist Dr. Mertie Moores for chest pain and had a cardiac cath on 01/29/13 that showed essentially normal coronaries with only minor luminal irregularities (20% mid LAD) and normal LVF with EF 60-65%. He was discharged back to his PCP for follow-up.  BP 124/75   Pulse 98   Temp 36.4 C   Resp 20   Ht 5\' 8"  (1.727 m)   Wt 265 lb 9.6 oz (120.5 kg)   SpO2 95%   BMI 40.38 kg/m   EKG 01/28/17: NSR.  Cardiac cath 01/29/13: Conclusions:   Essentially normal coronaries.  He had several minor luminal irregularities (20% mid LAD).  Normal LV function. EF 60-65%.  CXR 07/13/16: IMPRESSION: No active cardiopulmonary disease.  Preoperative labs noted.    If no acute changes then I anticipate that he can proceed as planned.  George Hugh [facility] Short Stay Center/Anesthesiology Phone [phone] 01/31/2017 2:50 PM

## 2017-01-31 NOTE — Telephone Encounter (Signed)
The date has been fixed and faxed back.

## 2017-02-02 MED ORDER — GABAPENTIN 300 MG PO CAPS
300.0000 mg | ORAL_CAPSULE | ORAL | Status: AC
  Administered 2017-02-03: 300 mg via ORAL

## 2017-02-02 MED ORDER — CEFOTETAN DISODIUM-DEXTROSE 2-2.08 GM-% IV SOLR
2.0000 g | INTRAVENOUS | Status: AC
  Administered 2017-02-03: 2 g via INTRAVENOUS
  Filled 2017-02-02: qty 50

## 2017-02-02 MED ORDER — ACETAMINOPHEN 500 MG PO TABS
1000.0000 mg | ORAL_TABLET | ORAL | Status: AC
  Administered 2017-02-03: 1000 mg via ORAL
  Filled 2017-02-02: qty 2

## 2017-02-03 ENCOUNTER — Ambulatory Visit (HOSPITAL_COMMUNITY): Payer: BC Managed Care – PPO | Admitting: Vascular Surgery

## 2017-02-03 ENCOUNTER — Observation Stay (HOSPITAL_COMMUNITY)
Admission: RE | Admit: 2017-02-03 | Discharge: 2017-02-04 | Disposition: A | Discharge status: Home / Self Care | Payer: BC Managed Care – PPO | Source: Ambulatory Visit | Attending: General Surgery | Admitting: General Surgery

## 2017-02-03 ENCOUNTER — Encounter (HOSPITAL_COMMUNITY)
Admission: RE | Disposition: A | Discharge status: Home / Self Care | Payer: Self-pay | Source: Ambulatory Visit | Attending: General Surgery

## 2017-02-03 ENCOUNTER — Ambulatory Visit (HOSPITAL_COMMUNITY): Payer: BC Managed Care – PPO | Admitting: Certified Registered Nurse Anesthetist

## 2017-02-03 ENCOUNTER — Encounter (HOSPITAL_COMMUNITY): Payer: Self-pay | Admitting: General Practice

## 2017-02-03 DIAGNOSIS — G4733 Obstructive sleep apnea (adult) (pediatric): Secondary | ICD-10-CM | POA: Insufficient documentation

## 2017-02-03 DIAGNOSIS — K219 Gastro-esophageal reflux disease without esophagitis: Secondary | ICD-10-CM | POA: Insufficient documentation

## 2017-02-03 DIAGNOSIS — I10 Essential (primary) hypertension: Secondary | ICD-10-CM | POA: Diagnosis not present

## 2017-02-03 DIAGNOSIS — E7800 Pure hypercholesterolemia, unspecified: Secondary | ICD-10-CM | POA: Insufficient documentation

## 2017-02-03 DIAGNOSIS — K8010 Calculus of gallbladder with chronic cholecystitis without obstruction: Principal | ICD-10-CM | POA: Insufficient documentation

## 2017-02-03 DIAGNOSIS — Z9989 Dependence on other enabling machines and devices: Secondary | ICD-10-CM | POA: Insufficient documentation

## 2017-02-03 DIAGNOSIS — Z7982 Long term (current) use of aspirin: Secondary | ICD-10-CM | POA: Diagnosis not present

## 2017-02-03 DIAGNOSIS — Z79899 Other long term (current) drug therapy: Secondary | ICD-10-CM | POA: Insufficient documentation

## 2017-02-03 DIAGNOSIS — I2510 Atherosclerotic heart disease of native coronary artery without angina pectoris: Secondary | ICD-10-CM | POA: Diagnosis not present

## 2017-02-03 DIAGNOSIS — K8020 Calculus of gallbladder without cholecystitis without obstruction: Secondary | ICD-10-CM | POA: Diagnosis present

## 2017-02-03 DIAGNOSIS — Z87891 Personal history of nicotine dependence: Secondary | ICD-10-CM | POA: Diagnosis not present

## 2017-02-03 DIAGNOSIS — Z6841 Body Mass Index (BMI) 40.0 and over, adult: Secondary | ICD-10-CM | POA: Diagnosis not present

## 2017-02-03 DIAGNOSIS — Z0279 Encounter for issue of other medical certificate: Secondary | ICD-10-CM

## 2017-02-03 DIAGNOSIS — Z9049 Acquired absence of other specified parts of digestive tract: Secondary | ICD-10-CM

## 2017-02-03 HISTORY — DX: Unspecified asthma, uncomplicated: J45.909

## 2017-02-03 HISTORY — PX: LAPAROSCOPIC CHOLECYSTECTOMY: SUR755

## 2017-02-03 HISTORY — PX: CHOLECYSTECTOMY: SHX55

## 2017-02-03 HISTORY — DX: Pure hypercholesterolemia, unspecified: E78.00

## 2017-02-03 SURGERY — LAPAROSCOPIC CHOLECYSTECTOMY
Anesthesia: General | Site: Abdomen

## 2017-02-03 MED ORDER — ROCURONIUM BROMIDE 10 MG/ML (PF) SYRINGE
PREFILLED_SYRINGE | INTRAVENOUS | Status: DC | PRN
  Administered 2017-02-03: 40 mg via INTRAVENOUS

## 2017-02-03 MED ORDER — LIDOCAINE 2% (20 MG/ML) 5 ML SYRINGE
INTRAMUSCULAR | Status: AC
  Filled 2017-02-03: qty 5

## 2017-02-03 MED ORDER — PROPOFOL 10 MG/ML IV BOLUS
INTRAVENOUS | Status: AC
  Filled 2017-02-03: qty 20

## 2017-02-03 MED ORDER — SODIUM CHLORIDE 0.9 % IV SOLN: 250.0000 mL | INTRAVENOUS | Status: DC | PRN

## 2017-02-03 MED ORDER — OXYCODONE HCL 5 MG PO TABS: 5.0000 mg | ORAL_TABLET | ORAL | Status: DC | PRN

## 2017-02-03 MED ORDER — SODIUM CHLORIDE 0.9% FLUSH: 3.0000 mL | Freq: Two times a day (BID) | INTRAVENOUS | Status: DC

## 2017-02-03 MED ORDER — SUGAMMADEX SODIUM 200 MG/2ML IV SOLN
INTRAVENOUS | Status: DC | PRN
  Administered 2017-02-03: 200 mg via INTRAVENOUS

## 2017-02-03 MED ORDER — FENTANYL CITRATE (PF) 100 MCG/2ML IJ SOLN
INTRAMUSCULAR | Status: DC | PRN
  Administered 2017-02-03: 125 ug via INTRAVENOUS
  Administered 2017-02-03: 50 ug via INTRAVENOUS
  Administered 2017-02-03: 75 ug via INTRAVENOUS

## 2017-02-03 MED ORDER — CHLORHEXIDINE GLUCONATE CLOTH 2 % EX PADS: 6.0000 | MEDICATED_PAD | Freq: Once | CUTANEOUS | Status: DC

## 2017-02-03 MED ORDER — ACETAMINOPHEN 325 MG PO TABS: 650.0000 mg | ORAL_TABLET | ORAL | Status: DC | PRN

## 2017-02-03 MED ORDER — SODIUM CHLORIDE 0.9% FLUSH: 3.0000 mL | INTRAVENOUS | Status: DC | PRN

## 2017-02-03 MED ORDER — ONDANSETRON HCL 4 MG/2ML IJ SOLN
4.0000 mg | Freq: Four times a day (QID) | INTRAMUSCULAR | Status: DC | PRN
  Administered 2017-02-04: 4 mg via INTRAVENOUS
  Filled 2017-02-03: qty 2

## 2017-02-03 MED ORDER — BUPIVACAINE HCL (PF) 0.25 % IJ SOLN
INTRAMUSCULAR | Status: AC
  Filled 2017-02-03: qty 30

## 2017-02-03 MED ORDER — ONDANSETRON HCL 4 MG/2ML IJ SOLN
INTRAMUSCULAR | Status: DC | PRN
  Administered 2017-02-03: 4 mg via INTRAVENOUS

## 2017-02-03 MED ORDER — KETOROLAC TROMETHAMINE 30 MG/ML IJ SOLN
INTRAMUSCULAR | Status: DC | PRN
  Administered 2017-02-03: 30 mg via INTRAVENOUS

## 2017-02-03 MED ORDER — PHENYLEPHRINE HCL 10 MG/ML IJ SOLN
INTRAMUSCULAR | Status: AC
  Filled 2017-02-03: qty 1

## 2017-02-03 MED ORDER — ONDANSETRON HCL 4 MG/2ML IJ SOLN
INTRAMUSCULAR | Status: AC
  Filled 2017-02-03: qty 2

## 2017-02-03 MED ORDER — MORPHINE SULFATE (PF) 4 MG/ML IV SOLN
1.0000 mg | INTRAVENOUS | Status: DC | PRN
  Administered 2017-02-03: 4 mg via INTRAVENOUS
  Filled 2017-02-03: qty 1

## 2017-02-03 MED ORDER — PHENYLEPHRINE 40 MCG/ML (10ML) SYRINGE FOR IV PUSH (FOR BLOOD PRESSURE SUPPORT)
PREFILLED_SYRINGE | INTRAVENOUS | Status: AC
  Filled 2017-02-03: qty 10

## 2017-02-03 MED ORDER — PROPOFOL 10 MG/ML IV BOLUS
INTRAVENOUS | Status: DC | PRN
  Administered 2017-02-03: 200 mg via INTRAVENOUS

## 2017-02-03 MED ORDER — MIDAZOLAM HCL 2 MG/2ML IJ SOLN
INTRAMUSCULAR | Status: AC
  Filled 2017-02-03: qty 2

## 2017-02-03 MED ORDER — SODIUM CHLORIDE 0.9 % IV SOLN
INTRAVENOUS | Status: DC
  Administered 2017-02-04: 03:00:00 via INTRAVENOUS

## 2017-02-03 MED ORDER — LACTATED RINGERS IV SOLN
INTRAVENOUS | Status: DC | PRN
  Administered 2017-02-03 (×2): via INTRAVENOUS

## 2017-02-03 MED ORDER — IOPAMIDOL (ISOVUE-300) INJECTION 61%
INTRAVENOUS | Status: AC
  Filled 2017-02-03: qty 50

## 2017-02-03 MED ORDER — OXYCODONE HCL 5 MG PO TABS
5.0000 mg | ORAL_TABLET | ORAL | Status: DC | PRN
  Filled 2017-02-03 (×3): qty 2

## 2017-02-03 MED ORDER — MORPHINE SULFATE (PF) 2 MG/ML IV SOLN: 1.0000 mg | INTRAVENOUS | Status: DC | PRN

## 2017-02-03 MED ORDER — ACETAMINOPHEN 650 MG RE SUPP: 650.0000 mg | RECTAL | Status: DC | PRN

## 2017-02-03 MED ORDER — PHENYLEPHRINE HCL 10 MG/ML IJ SOLN
INTRAVENOUS | Status: DC | PRN
  Administered 2017-02-03: 20 ug/min via INTRAVENOUS

## 2017-02-03 MED ORDER — FENTANYL CITRATE (PF) 100 MCG/2ML IJ SOLN: 25.0000 ug | INTRAMUSCULAR | Status: DC | PRN

## 2017-02-03 MED ORDER — 0.9 % SODIUM CHLORIDE (POUR BTL) OPTIME
TOPICAL | Status: DC | PRN
  Administered 2017-02-03: 1000 mL

## 2017-02-03 MED ORDER — SUCCINYLCHOLINE CHLORIDE 200 MG/10ML IV SOSY
PREFILLED_SYRINGE | INTRAVENOUS | Status: DC | PRN
  Administered 2017-02-03: 120 mg via INTRAVENOUS

## 2017-02-03 MED ORDER — LIDOCAINE 2% (20 MG/ML) 5 ML SYRINGE
INTRAMUSCULAR | Status: DC | PRN
  Administered 2017-02-03: 100 mg via INTRAVENOUS

## 2017-02-03 MED ORDER — HYDROMORPHONE HCL 1 MG/ML IJ SOLN
0.2500 mg | INTRAMUSCULAR | Status: DC | PRN
  Administered 2017-02-03 (×3): 0.5 mg via INTRAVENOUS

## 2017-02-03 MED ORDER — LACTATED RINGERS IV SOLN
INTRAVENOUS | Status: DC
  Administered 2017-02-03: 50 mL/h via INTRAVENOUS

## 2017-02-03 MED ORDER — FENTANYL CITRATE (PF) 250 MCG/5ML IJ SOLN
INTRAMUSCULAR | Status: AC
  Filled 2017-02-03: qty 5

## 2017-02-03 MED ORDER — PROMETHAZINE HCL 25 MG/ML IJ SOLN
6.2500 mg | INTRAMUSCULAR | Status: AC | PRN
  Administered 2017-02-03 (×2): 6.25 mg via INTRAVENOUS

## 2017-02-03 MED ORDER — SUGAMMADEX SODIUM 200 MG/2ML IV SOLN
INTRAVENOUS | Status: AC
  Filled 2017-02-03: qty 2

## 2017-02-03 MED ORDER — MIDAZOLAM HCL 5 MG/5ML IJ SOLN
INTRAMUSCULAR | Status: DC | PRN
  Administered 2017-02-03: 2 mg via INTRAVENOUS

## 2017-02-03 MED ORDER — ACETAMINOPHEN 325 MG PO TABS
650.0000 mg | ORAL_TABLET | ORAL | Status: DC | PRN
  Administered 2017-02-04 (×2): 650 mg via ORAL
  Filled 2017-02-03 (×4): qty 2

## 2017-02-03 MED ORDER — OXYCODONE HCL 5 MG PO TABS
ORAL_TABLET | ORAL | Status: AC
  Filled 2017-02-03: qty 2

## 2017-02-03 MED ORDER — GABAPENTIN 300 MG PO CAPS
ORAL_CAPSULE | ORAL | Status: AC
  Administered 2017-02-03: 300 mg via ORAL
  Filled 2017-02-03: qty 1

## 2017-02-03 MED ORDER — KETOROLAC TROMETHAMINE 30 MG/ML IJ SOLN
INTRAMUSCULAR | Status: AC
  Filled 2017-02-03: qty 1

## 2017-02-03 MED ORDER — PROMETHAZINE HCL 25 MG/ML IJ SOLN
INTRAMUSCULAR | Status: AC
  Filled 2017-02-03: qty 1

## 2017-02-03 MED ORDER — OXYCODONE HCL 5 MG PO TABS
5.0000 mg | ORAL_TABLET | ORAL | Status: DC | PRN
  Administered 2017-02-03: 10 mg via ORAL

## 2017-02-03 MED ORDER — HYDROMORPHONE HCL 1 MG/ML IJ SOLN
INTRAMUSCULAR | Status: AC
Start: 2017-02-03 — End: 2017-02-04
  Filled 2017-02-03: qty 0.5

## 2017-02-03 MED ORDER — HYDROMORPHONE HCL 1 MG/ML IJ SOLN
INTRAMUSCULAR | Status: AC
  Filled 2017-02-03: qty 1

## 2017-02-03 MED ORDER — OXYCODONE HCL 5 MG PO TABS: 5.0000 mg | ORAL_TABLET | Freq: Four times a day (QID) | ORAL | 0 refills | Status: DC | PRN

## 2017-02-03 MED ORDER — BUPIVACAINE HCL (PF) 0.25 % IJ SOLN
INTRAMUSCULAR | Status: DC | PRN
  Administered 2017-02-03: 55 mL

## 2017-02-03 MED ORDER — PHENYLEPHRINE 40 MCG/ML (10ML) SYRINGE FOR IV PUSH (FOR BLOOD PRESSURE SUPPORT)
PREFILLED_SYRINGE | INTRAVENOUS | Status: DC | PRN
  Administered 2017-02-03 (×2): 80 ug via INTRAVENOUS
  Administered 2017-02-03 (×2): 120 ug via INTRAVENOUS

## 2017-02-03 MED ORDER — SUCCINYLCHOLINE CHLORIDE 200 MG/10ML IV SOSY
PREFILLED_SYRINGE | INTRAVENOUS | Status: AC
  Filled 2017-02-03: qty 10

## 2017-02-03 SURGICAL SUPPLY — 58 items
APL SKNCLS STERI-STRIP NONHPOA (GAUZE/BANDAGES/DRESSINGS) ×2
APPLIER CLIP 5 13 M/L LIGAMAX5 (MISCELLANEOUS) ×4
APR CLP MED LRG 5 ANG JAW (MISCELLANEOUS) ×2
BAG SPEC RTRVL 10 TROC 200 (ENDOMECHANICALS) ×2
BANDAGE ADH SHEER 1  50/CT (GAUZE/BANDAGES/DRESSINGS) ×12 IMPLANT
BENZOIN TINCTURE PRP APPL 2/3 (GAUZE/BANDAGES/DRESSINGS) ×4 IMPLANT
BLADE CLIPPER SURG (BLADE) IMPLANT
BNDG ADH 5X3 H2O RPLNT NS (GAUZE/BANDAGES/DRESSINGS) ×2
BNDG COHESIVE 3X5 WHT NS (GAUZE/BANDAGES/DRESSINGS) ×3 IMPLANT
CANISTER SUCT 3000ML PPV (MISCELLANEOUS) ×4 IMPLANT
CHLORAPREP W/TINT 26ML (MISCELLANEOUS) ×4 IMPLANT
CLIP APPLIE 5 13 M/L LIGAMAX5 (MISCELLANEOUS) ×2 IMPLANT
CLOSURE STERI-STRIP 1/2X4 (GAUZE/BANDAGES/DRESSINGS) ×1
CLOSURE WOUND 1/2 X4 (GAUZE/BANDAGES/DRESSINGS) ×1
CLSR STERI-STRIP ANTIMIC 1/2X4 (GAUZE/BANDAGES/DRESSINGS) ×2 IMPLANT
COVER MAYO STAND STRL (DRAPES) ×1 IMPLANT
COVER SURGICAL LIGHT HANDLE (MISCELLANEOUS) ×4 IMPLANT
DRAPE C-ARM 42X72 X-RAY (DRAPES) ×1 IMPLANT
DRSG TEGADERM 4X4.75 (GAUZE/BANDAGES/DRESSINGS) ×4 IMPLANT
ELECT REM PT RETURN 9FT ADLT (ELECTROSURGICAL) ×4
ELECTRODE REM PT RTRN 9FT ADLT (ELECTROSURGICAL) ×2 IMPLANT
GAUZE SPONGE 2X2 8PLY STRL LF (GAUZE/BANDAGES/DRESSINGS) ×3 IMPLANT
GLOVE BIO SURGEON STRL SZ7.5 (GLOVE) ×3 IMPLANT
GLOVE BIOGEL M STRL SZ7.5 (GLOVE) ×4 IMPLANT
GLOVE BIOGEL PI IND STRL 7.0 (GLOVE) ×2 IMPLANT
GLOVE BIOGEL PI IND STRL 7.5 (GLOVE) ×1 IMPLANT
GLOVE BIOGEL PI IND STRL 8 (GLOVE) ×4 IMPLANT
GLOVE BIOGEL PI INDICATOR 7.0 (GLOVE) ×4
GLOVE BIOGEL PI INDICATOR 7.5 (GLOVE) ×2
GLOVE BIOGEL PI INDICATOR 8 (GLOVE) ×4
GLOVE ECLIPSE 6.5 STRL STRAW (GLOVE) ×3 IMPLANT
GLOVE SURG SS PI 6.5 STRL IVOR (GLOVE) ×3 IMPLANT
GOWN STRL REUS W/ TWL LRG LVL3 (GOWN DISPOSABLE) ×6 IMPLANT
GOWN STRL REUS W/TWL 2XL LVL3 (GOWN DISPOSABLE) ×4 IMPLANT
GOWN STRL REUS W/TWL LRG LVL3 (GOWN DISPOSABLE) ×12
GRASPER SUT TROCAR 14GX15 (MISCELLANEOUS) IMPLANT
KIT BASIN OR (CUSTOM PROCEDURE TRAY) ×4 IMPLANT
KIT ROOM TURNOVER OR (KITS) ×4 IMPLANT
NS IRRIG 1000ML POUR BTL (IV SOLUTION) ×4 IMPLANT
PAD ARMBOARD 7.5X6 YLW CONV (MISCELLANEOUS) ×4 IMPLANT
POUCH RETRIEVAL ECOSAC 10 (ENDOMECHANICALS) ×2 IMPLANT
POUCH RETRIEVAL ECOSAC 10MM (ENDOMECHANICALS) ×2
SCISSORS LAP 5X35 DISP (ENDOMECHANICALS) ×4 IMPLANT
SET CHOLANGIOGRAPH 5 50 .035 (SET/KITS/TRAYS/PACK) ×1 IMPLANT
SET IRRIG TUBING LAPAROSCOPIC (IRRIGATION / IRRIGATOR) ×4 IMPLANT
SLEEVE ENDOPATH XCEL 5M (ENDOMECHANICALS) ×8 IMPLANT
SPECIMEN JAR SMALL (MISCELLANEOUS) ×4 IMPLANT
SPONGE GAUZE 2X2 STER 10/PKG (GAUZE/BANDAGES/DRESSINGS) ×4
STRIP CLOSURE SKIN 1/2X4 (GAUZE/BANDAGES/DRESSINGS) ×3 IMPLANT
SUT MNCRL AB 4-0 PS2 18 (SUTURE) ×7 IMPLANT
SUT VICRYL 0 UR6 27IN ABS (SUTURE) IMPLANT
SYRINGE 20CC LL (MISCELLANEOUS) ×3 IMPLANT
TOWEL OR 17X24 6PK STRL BLUE (TOWEL DISPOSABLE) ×4 IMPLANT
TOWEL OR 17X26 10 PK STRL BLUE (TOWEL DISPOSABLE) ×4 IMPLANT
TRAY LAPAROSCOPIC MC (CUSTOM PROCEDURE TRAY) ×4 IMPLANT
TROCAR XCEL BLUNT TIP 100MML (ENDOMECHANICALS) ×4 IMPLANT
TROCAR XCEL NON-BLD 5MMX100MML (ENDOMECHANICALS) ×4 IMPLANT
TUBING INSUFFLATION (TUBING) ×4 IMPLANT

## 2017-02-03 NOTE — Anesthesia Procedure Notes (Signed)
Procedure Name: Intubation Date/Time: 02/03/2017 2:16 PM Performed by: Garrison Columbus T Pre-anesthesia Checklist: Patient identified, Emergency Drugs available, Suction available and Patient being monitored Patient Re-evaluated:Patient Re-evaluated prior to inductionOxygen Delivery Method: Circle System Utilized Preoxygenation: Pre-oxygenation with 100% oxygen Intubation Type: IV induction Ventilation: Mask ventilation without difficulty and Oral airway inserted - appropriate to patient size Laryngoscope Size: Mac and 4 Grade View: Grade I Tube type: Oral Tube size: 7.5 mm Number of attempts: 1 Airway Equipment and Method: Stylet and Oral airway Placement Confirmation: ETT inserted through vocal cords under direct vision,  positive ETCO2 and breath sounds checked- equal and bilateral Secured at: 23 cm Tube secured with: Tape Dental Injury: Teeth and Oropharynx as per pre-operative assessment  Comments: Intubation by Leanora Cover, CRNA

## 2017-02-03 NOTE — Transfer of Care (Signed)
Immediate Anesthesia Transfer of Care Note  Patient: Taylor Schroeder  Procedure(s) Performed: Procedure(s): LAPAROSCOPIC CHOLECYSTECTOMY (N/A)  Patient Location: PACU  Anesthesia Type:General  Level of Consciousness: awake, alert  and oriented  Airway & Oxygen Therapy: Patient Spontanous Breathing  Post-op Assessment: Report given to RN, Post -op Vital signs reviewed and stable and Patient moving all extremities X 4  Post vital signs: Reviewed and stable  Last Vitals:  Vitals:   02/03/17 1232 02/03/17 1540  BP: (!) 135/103 124/85  Pulse: 87 91  Resp: 20 12  Temp: 36.9 C     Last Pain:  Vitals:   02/03/17 1232  TempSrc: Oral  PainSc: 3       Patients Stated Pain Goal: 2 (76/19/50 9326)  Complications: No apparent anesthesia complications

## 2017-02-03 NOTE — Discharge Instructions (Signed)
[facility] °LAPAROSCOPIC SURGERY: POST OP INSTRUCTIONS °Always review your discharge instruction sheet given to you by the facility where your surgery was performed. °IF YOU HAVE DISABILITY OR FAMILY LEAVE FORMS, YOU MUST BRING THEM TO THE OFFICE FOR PROCESSING.   °DO NOT GIVE THEM TO YOUR DOCTOR. ° °1. A prescription for pain medication may be given to you upon discharge.  Take your pain medication as prescribed, if needed.  If narcotic pain medicine is not needed, then you may take acetaminophen (Tylenol) &/ or ibuprofen (Advil) as needed. °2. Take your usually prescribed medications unless otherwise directed. °3. If you need a refill on your pain medication, please contact your pharmacy.  They will contact our office to request authorization. Prescriptions will not be filled after 5pm or on week-ends. °4. You should follow a light diet the first few days after arrival home, such as soup and crackers, etc.  Be sure to include lots of fluids daily. °5. Most patients will experience some swelling and bruising in the area of the incisions.  Ice packs will help.  Swelling and bruising can take several days to resolve.  °6. It is common to experience some constipation if taking pain medication after surgery.  Increasing fluid intake and taking a stool softener (such as Colace) will usually help or prevent this problem from occurring.  A mild laxative (Milk of Magnesia or Miralax) should be taken according to package instructions if there are no bowel movements after 48 hours. °7. Unless discharge instructions indicate otherwise, you may remove your bandages 48 hours after surgery, and you may shower at that time.  You  have steri-strips (small skin tapes) in place directly over the incision.  These strips should be left on the skin for 7-10 days.  °8. ACTIVITIES:  You may resume regular (light) daily activities beginning the next day--such as daily self-care, walking, climbing stairs--gradually  increasing activities as tolerated.  You may have sexual intercourse when it is comfortable.  Refrain from any heavy lifting or straining until approved by your doctor. °a. You may drive when you are no longer taking prescription pain medication, you can comfortably wear a seatbelt, and you can safely maneuver your car and apply brakes. °9. You should see your doctor in the office for a follow-up appointment approximately 2-3 weeks after your surgery.  Make sure that you call for this appointment within a day or two after you arrive home to insure a convenient appointment time. °10. OTHER INSTRUCTIONS:  °WHEN TO CALL YOUR DOCTOR: °1. Fever over 101.0 °2. Inability to urinate °3. Continued bleeding from incision. °4. Increased pain, redness, or drainage from the incision. °5. Increasing abdominal pain ° °The clinic staff is available to answer your questions during regular business hours.  Please don’t hesitate to call and ask to speak to one of the nurses for clinical concerns.  If you have a medical emergency, go to the nearest emergency room or call 911.  A surgeon from [facility] is always on call at the hospital. °[address], [address], [place], [place]  [address] ? [address] [address], [place], [place]   [address] °[phone] ? [phone] ? FAX [phone] °Web site: [email or web address] ° ° ° ° ° °

## 2017-02-03 NOTE — H&P (Signed)
Taylor Schroeder 01/07/2017 1:31 PM Location: [facility] Patient #: [id number] DOB: 01-Jan-1966 Married / Language: English / Race: White Male   History of Present Illness Taylor Hiss [name]; 01/07/2017 1:59 PM) The patient is a 51 year old male who presents for evaluation of gall stones. He is referred by Dr Ronnald Ramp for evaluation of gallstones and RUQ pain. He states in mid March she started having some upper abdominal discomfort. He describes it as being on his right side and radiating to his shoulder. It was associated with nausea. He ended up seeing his primary care physician on March 20. Labs and a CT scan was ordered. CT revealed gallstones, fatty liver and a few scattered punctate calcifications in the pancreas without any evidence of pancreatitis. There was no evidence of cholecystitis. He has had ongoing abdominal discomfort mainly in the right upper abdomen radiating to his back worsens with food intake. The increased intensity last for a few hours and will ease up. He denies any alcohol intake. He quit smoking about 8 years ago. Spaghetti will deftly trigger an episode of right upper quadrant pain. He denies any melena, hematochezia, acholic stools. He doesn't take NSAIDs on a frequent basis. He had a prior colonoscopy which showed possible Crohn's disease. He generally alternates between diarrhea and constipation. He has had an appendectomy in the past. Labs did not reveal any evidence of pancreatitis, his comprehensive metabolic panel and CBC were also within normal limits.   Problem List/Past Medical Taylor Hiss M. [name], MD; 01/07/2017 1:59 PM) SYMPTOMATIC CHOLELITHIASIS (K80.20)   Past Surgical History Taylor Schroeder, RMA; 01/07/2017 1:31 PM) Appendectomy  Knee Surgery  Left. Oral Surgery  Shoulder Surgery  Left.  Diagnostic Studies History Taylor Schroeder, [place]; 01/07/2017 1:31 PM) Colonoscopy  1-5 years ago  Allergies Taylor Schroeder, RMA;  01/07/2017 1:33 PM) Taylor Schroeder *ANTIHISTAMINES*  Hives. Demerol *ANALGESICS - OPIOID*  Nausea, Vomiting. HydroCHLOROthiazide *DIURETICS*  Shortness of breath. Fexofenadine HCl *ANTIHISTAMINES*  Meperidine HCl *ANALGESICS - OPIOID*   Medication History Taylor Hiss M. [name], MD; 01/07/2017 1:59 PM) Aspirin EC (81MG  Tablet DR, Oral) Active. Protonix (40MG  Tablet DR, Oral) Active. Crestor (10MG  Tablet, Oral) Active. Telmisartan (80MG  Tablet, Oral) Active. Medications Reconciled Ondansetron (4MG  Tablet Disint, 1 (one) Tablet Oral every six hours, as needed, Taken starting 01/07/2017) Active.  Social History Taylor Schroeder, [place]; 01/07/2017 1:31 PM) Alcohol use  Occasional alcohol use. Caffeine use  Carbonated beverages, Coffee, Tea. No drug use  Tobacco use  Former smoker.  Family History Taylor Schroeder, [place]; 01/07/2017 1:31 PM) Cervical Cancer  Mother. Hypertension  Mother. Melanoma  Father. Migraine Headache  Mother.  Other Problems Taylor Hiss M. [name], MD; 01/07/2017 1:59 PM) Arthritis  Cholelithiasis  Enlarged Prostate  Gastroesophageal Reflux Disease  High blood pressure  Hypercholesterolemia     Review of Systems Taylor Schroeder RMA; 01/07/2017 1:31 PM) General Present- Fatigue and Weight Loss. Not Present- Appetite Loss, Chills, Fever, Night Sweats and Weight Gain. Skin Not Present- Change in Wart/Mole, Dryness, Hives, Jaundice, New Lesions, Non-Healing Wounds, Rash and Ulcer. HEENT Present- Seasonal Allergies and Wears glasses/contact lenses. Not Present- Earache, Hearing Loss, Hoarseness, Nose Bleed, Oral Ulcers, Ringing in the Ears, Sinus Pain, Sore Throat, Visual Disturbances and Yellow Eyes. Respiratory Present- Snoring. Not Present- Bloody sputum, Chronic Cough, Difficulty Breathing and Wheezing. Breast Not Present- Breast Mass, Breast Pain, Nipple Discharge and Skin Changes. Cardiovascular Not Present- Chest Pain, Difficulty Breathing Lying Down, Leg  Cramps, Palpitations, Rapid Heart Rate, Shortness of Breath and Swelling of Extremities. Gastrointestinal  Present- Abdominal Pain, Bloating, Change in Bowel Habits, Constipation, Excessive gas, Indigestion and Nausea. Not Present- Bloody Stool, Chronic diarrhea, Difficulty Swallowing, Gets full quickly at meals, Hemorrhoids, Rectal Pain and Vomiting. Male Genitourinary Present- Urgency. Not Present- Blood in Urine, Change in Urinary Stream, Frequency, Impotence, Nocturia, Painful Urination and Urine Leakage. Musculoskeletal Present- Joint Pain and Joint Stiffness. Not Present- Back Pain, Muscle Pain, Muscle Weakness and Swelling of Extremities. Neurological Not Present- Decreased Memory, Fainting, Headaches, Numbness, Seizures, Tingling, Tremor, Trouble walking and Weakness. Psychiatric Not Present- Anxiety, Bipolar, Change in Sleep Pattern, Depression, Fearful and Frequent crying. Endocrine Not Present- Cold Intolerance, Excessive Hunger, Hair Changes, Heat Intolerance, Hot flashes and New Diabetes. Hematology Not Present- Blood Thinners, Easy Bruising, Excessive bleeding, Gland problems, HIV and Persistent Infections.  Vitals Taylor Schroeder RMA; 01/07/2017 1:35 PM) 01/07/2017 1:34 PM Weight: 265 lb Height: 68in Body Surface Area: 2.3 m Body Mass Index: 40.29 kg/m  Temp.: 97.75F  Pulse: 86 (Regular)  BP: 160/100 (Sitting, Left Arm, Standard)       Physical Exam Taylor Hiss M. [name] MD; 01/07/2017 1:54 PM) General Mental Status-Alert. General Appearance-Consistent with stated age. Hydration-Well hydrated. Voice-Normal. Note: morbidly obese   Head and Neck Head-normocephalic, atraumatic with no lesions or palpable masses. Trachea-midline. Thyroid Gland Characteristics - normal size and consistency.  Eye Eyeball - Bilateral-Extraocular movements intact. Sclera/Conjunctiva - Bilateral-No scleral icterus.  ENMT Mouth and Throat -Note: normal  dentition.  Note: normal external ears   Chest and Lung Exam Chest and lung exam reveals -quiet, even and easy respiratory effort with no use of accessory muscles and on auscultation, normal breath sounds, no adventitious sounds and normal vocal resonance. Inspection Chest Wall - Normal. Back - normal.  Breast - Did not examine.  Cardiovascular Cardiovascular examination reveals -normal heart sounds, regular rate and rhythm with no murmurs and normal pedal pulses bilaterally.  Abdomen Inspection  Inspection of the abdomen reveals: Note: small fat containing umbilical hernia - 1cm. Skin - Scar - no surgical scars. Palpation/Percussion Palpation and Percussion of the abdomen reveal - Soft, No Rebound tenderness, No Rigidity (guarding) and No hepatosplenomegaly. Note: mild TTP in RUQ; no rebound/peritonitis. Auscultation Auscultation of the abdomen reveals - Bowel sounds normal.  Peripheral Vascular Upper Extremity Palpation - Pulses bilaterally normal.  Neurologic Neurologic evaluation reveals -alert and oriented x 3 with no impairment of recent or remote memory. Mental Status-Normal.  Neuropsychiatric The patient's mood and affect are described as -normal. Judgment and Insight-insight is appropriate concerning matters relevant to self.  Musculoskeletal Normal Exam - Left-Upper Extremity Strength Normal and Lower Extremity Strength Normal. Normal Exam - Right-Upper Extremity Strength Normal and Lower Extremity Strength Normal.  Lymphatic Head & Neck  General Head & Neck Lymphatics: Bilateral - Description - Normal. Axillary - Did not examine. Femoral & Inguinal - Did not examine.    Assessment & Plan Taylor Hiss M. Tahjae Clausing MD; 01/07/2017 1:53 PM) SYMPTOMATIC CHOLELITHIASIS (K80.20) Impression: I believe the patient's symptoms are consistent with gallbladder disease.  We discussed gallbladder disease. The patient was given Neurosurgeon. We discussed  non-operative and operative management. We discussed the signs & symptoms of acute cholecystitis  I discussed laparoscopic cholecystectomy with IOC in detail. The patient was given educational material as well as diagrams detailing the procedure. We discussed the risks and benefits of a laparoscopic cholecystectomy including, but not limited to bleeding, infection, injury to surrounding structures such as the intestine or liver, bile leak, retained gallstones, need to convert to an open procedure, prolonged diarrhea,  blood clots such as DVT, common bile duct injury, anesthesia risks, and possible need for additional procedures. We discussed the typical post-operative recovery course. I explained that the likelihood of improvement of their symptoms is good.  The patient has elected to proceed with surgery. Current Plans You are being scheduled for surgery- Our schedulers will call you.  You should hear from our office's scheduling department within 5 working days about the location, date, and time of surgery. We try to make accommodations for patient's preferences in scheduling surgery, but sometimes the OR schedule or the surgeon's schedule prevents [name] from making those accommodations.  If you have not heard from our office [phone]) in 5 working days, call the office and ask for your surgeon's nurse.  If you have other questions about your diagnosis, plan, or surgery, call the office and ask for your surgeon's nurse.  Pt Education - Pamphlet Given - Laparoscopic Gallbladder Surgery: discussed with patient and provided information. Started Ondansetron 4MG , 1 (one) Tablet every six hours, as needed, #15, 01/07/2017, No Refill.   Leighton Ruff. [name], MD, FACS General, Bariatric, & Minimally Invasive Surgery [facility], [place]

## 2017-02-03 NOTE — Op Note (Addendum)
Taylor Schroeder [id number] Dec 11, 1965 02/03/2017  Laparoscopic Cholecystectomy Procedure Note  Indications: This patient presents with symptomatic gallbladder disease and will undergo laparoscopic cholecystectomy.  Pre-operative Diagnosis: symptomatic cholelithiasis  Post-operative Diagnosis: chronic calculous cholecystitis  Surgeon: Gayland Curry   Assistants: none  Anesthesia: General endotracheal anesthesia    Procedure Details  The patient was seen again in the Holding Room. The risks, benefits, complications, treatment options, and expected outcomes were discussed with the patient. The possibilities of reaction to medication, pulmonary aspiration, perforation of viscus, bleeding, recurrent infection, finding a normal gallbladder, the need for additional procedures, failure to diagnose a condition, the possible need to convert to an open procedure, and creating a complication requiring transfusion or operation were discussed with the patient. The likelihood of improving the patient's symptoms with return to their baseline status is good.  The patient and/or family concurred with the proposed plan, giving informed consent. The site of surgery properly noted. The patient was taken to Operating Room, identified as Taylor Schroeder and the procedure verified as Laparoscopic Cholecystectomy with Intraoperative Cholangiogram. A Time Out was held and the above information confirmed. Antibiotic prophylaxis was administered.   Prior to the induction of general anesthesia, antibiotic prophylaxis was administered. General endotracheal anesthesia was then administered and tolerated well. After the induction, the abdomen was prepped with Chloraprep and draped in the sterile fashion. The patient was positioned in the supine position.  Local anesthetic agent was injected into the skin near the umbilicus and an incision made. We dissected down to the abdominal fascia with blunt dissection.  The fascia was  incised vertically and we entered the peritoneal cavity bluntly.  A pursestring suture of 0-Vicryl was placed around the fascial opening.  The Hasson cannula was inserted and secured with the stay suture.  Pneumoperitoneum was then created with CO2 and tolerated well without any adverse changes in the patient's vital signs. An 5-mm port was placed in the subxiphoid position.  Two 5-mm ports were placed in the right upper quadrant. All skin incisions were infiltrated with a local anesthetic agent before making the incision and placing the trocars.   We positioned the patient in reverse Trendelenburg, tilted slightly to the patient's left.  Local was infiltrated in b/l upper lateral abdominal walls as a TAP block. The gallbladder was identified, the fundus grasped and retracted cephalad. The gallbladder was distended. There was some adipose tissue encasing the infundibulum.  Adhesions were lysed bluntly and with the electrocautery where indicated, taking care not to injure any adjacent organs or viscus. The infundibulum was grasped and retracted laterally, exposing the peritoneum overlying the triangle of Calot. This was then divided and exposed in a blunt fashion. A critical view of the cystic duct and cystic artery was obtained.  The cystic duct was clearly identified and bluntly dissected circumferentially.   The cystic duct was then ligated with clips and divided. The cystic artery which had been identified & dissected free was ligated with clips and divided as well.   The gallbladder was dissected from the liver bed in retrograde fashion with the electrocautery. There was some spillage of bile from the gallbladder. The gallbladder was removed and placed in an Ecco sac.  The gallbladder and Ecco sac were then removed through the umbilical port site. The liver bed was irrigated and inspected. Hemostasis was achieved with the electrocautery. Copious irrigation was utilized and was repeatedly aspirated until  clear.  The pursestring suture was used to close the umbilical fascia.  We again inspected the right upper quadrant for hemostasis.  The umbilical closure was inspected and there was no air leak and nothing trapped within the closure. However given his morbid obesity I did elect to place an additional interrupted 0 vicryl suture at the umbilical fascia using the PMI suture passer.  Pneumoperitoneum was released as we removed the trocars.  4-0 Monocryl was used to close the skin.   Benzoin, steri-strips, and clean dressings were applied. The patient was then extubated and brought to the recovery room in stable condition. Instrument, sponge, and needle counts were correct at closure and at the conclusion of the case.   Findings: Chronic Cholecystitis with Cholelithiasis  Estimated Blood Loss: Minimal         Drains: none         Specimens: Gallbladder           Complications: None; patient tolerated the procedure well.         Disposition: PACU - hemodynamically stable.         Condition: stable  Taylor Ruff. [name], MD, FACS General, Bariatric, & Minimally Invasive Surgery [facility], [place]

## 2017-02-03 NOTE — Progress Notes (Signed)
Patient has attempted to get up and walk twice in preparation to discharge home.  Each time he has had episodes of severe nausea with vomiting, despite receiving medication.  Also complaining of pain and is worried if he goes home he will not be able to keep pain pills down.  Discussed the case with Dr. Ninfa Linden who will admit patient for overnight observation.  Patient and wife updated regarding this plan.

## 2017-02-03 NOTE — Interval H&P Note (Signed)
History and Physical Interval Note:  02/03/2017 2:01 PM  Taylor Schroeder  has presented today for surgery, with the diagnosis of SYMPTOMATIC CHOLELITHIASIS  The various methods of treatment have been discussed with the patient and family. After consideration of risks, benefits and other options for treatment, the patient has consented to  Procedure(s): LAPAROSCOPIC CHOLECYSTECTOMY WITH INTRAOPERATIVE CHOLANGIOGRAM (N/A) as a surgical intervention .  The patient's history has been reviewed, patient examined, no change in status, stable for surgery.  I have reviewed the patient's chart and labs.  Questions were answered to the patient's satisfaction.   Leighton Ruff. [name], MD, [place], Bariatric, & Minimally Invasive Surgery [facility], [place]    [facility] : [facility]

## 2017-02-03 NOTE — Anesthesia Preprocedure Evaluation (Addendum)
Anesthesia Evaluation  Patient identified by MRN, date of birth, ID band Patient awake    History of Anesthesia Complications (+) PONV and history of anesthetic complications  Airway Mallampati: II  TM Distance: >3 FB Neck ROM: Full    Dental no notable dental hx. (+) Teeth Intact, Dental Advisory Given   Pulmonary asthma , former smoker,    breath sounds clear to auscultation       Cardiovascular hypertension, + CAD   Rhythm:Regular Rate:Normal     Neuro/Psych    GI/Hepatic hiatal hernia, GERD  ,  Endo/Other    Renal/GU      Musculoskeletal  (+) Arthritis ,   Abdominal   Peds  Hematology  (+) anemia ,   Anesthesia Other Findings   Reproductive/Obstetrics                            Anesthesia Physical Anesthesia Plan  ASA: III  Anesthesia Plan: General   Post-op Pain Management:    Induction: Intravenous  Airway Management Planned: Oral ETT  Additional Equipment:   Intra-op Plan:   Post-operative Plan: Extubation in OR  Informed Consent:   Plan Discussed with: CRNA  Anesthesia Plan Comments:         Anesthesia Quick Evaluation

## 2017-02-04 ENCOUNTER — Encounter (HOSPITAL_COMMUNITY): Payer: Self-pay | Admitting: General Surgery

## 2017-02-04 DIAGNOSIS — K8010 Calculus of gallbladder with chronic cholecystitis without obstruction: Secondary | ICD-10-CM | POA: Diagnosis not present

## 2017-02-04 MED ORDER — PROMETHAZINE HCL 25 MG/ML IJ SOLN: 12.5000 mg | Freq: Four times a day (QID) | INTRAMUSCULAR | Status: DC | PRN

## 2017-02-04 MED ORDER — KETOROLAC TROMETHAMINE 30 MG/ML IJ SOLN
30.0000 mg | Freq: Three times a day (TID) | INTRAMUSCULAR | Status: DC
  Administered 2017-02-04: 30 mg via INTRAVENOUS
  Filled 2017-02-04: qty 1

## 2017-02-04 NOTE — Progress Notes (Signed)
oxyCODONE (Oxy IR/ROXICODONE) immediate release tablet 10 mg given.

## 2017-02-04 NOTE — Progress Notes (Signed)
oxyCODONE (Oxy IR/ROXICODONE) immediate release tablet 10 mg given

## 2017-02-04 NOTE — Anesthesia Postprocedure Evaluation (Addendum)
Anesthesia Post Note  Patient: Taylor Schroeder  Procedure(s) Performed: Procedure(s) (LRB): LAPAROSCOPIC CHOLECYSTECTOMY (N/A)  Patient location during evaluation: PACU Anesthesia Type: General Level of consciousness: awake and alert Pain management: pain level controlled Vital Signs Assessment: post-procedure vital signs reviewed and stable Respiratory status: spontaneous breathing, nonlabored ventilation, respiratory function stable and patient connected to nasal cannula oxygen Cardiovascular status: blood pressure returned to baseline and stable Postop Assessment: no signs of nausea or vomiting Anesthetic complications: no       Last Vitals:  Vitals:   02/03/17 1959 02/04/17 0550  BP: 125/86 123/73  Pulse: 81 83  Resp: 18 18  Temp: 36.6 C 36.7 C    Last Pain:  Vitals:   02/04/17 0550  TempSrc: Oral  PainSc:                  [name],JAMES TERRILL

## 2017-02-08 NOTE — Discharge Summary (Signed)
Physician Discharge Summary  Taylor Schroeder WJX:[id number] DOB: 05-Apr-1966 DOA: 02/03/2017  PCP: Janith Lima, MD  Admit date: 02/03/2017 Discharge date: 02/04/2017  Recommendations for Outpatient Follow-up:  1.   Follow-up Information    Greer Pickerel, MD. Schedule an appointment as soon as possible for a visit in 3 weeks.   Specialty:  General Surgery Why:  For wound re-check Contact information: [place] [address] [phone]          Discharge Diagnoses:  1. Chronic calculous cholecystitis 2. Morbid obesity 3. osa on cpap 4. HTN 5. hypercholesterolemia  Surgical Procedure: Laparoscopic cholecystectomy  Discharge Condition: Good Disposition: Home  Diet recommendation: Cardiac  Filed Weights   02/03/17 1232  Weight: 120.5 kg (265 lb 9.6 oz)    History of present illness: The patient is a 51 year old male who presents for evaluation of gall stones. He is referred by Dr Ronnald Ramp for evaluation of gallstones and RUQ pain. He states in mid March she started having some upper abdominal discomfort. He describes it as being on his right side and radiating to his shoulder. It was associated with nausea. He ended up seeing his primary care physician on March 20. Labs and a CT scan was ordered. CT revealed gallstones, fatty liver and a few scattered punctate calcifications in the pancreas without any evidence of pancreatitis. There was no evidence of cholecystitis. He has had ongoing abdominal discomfort mainly in the right upper abdomen radiating to his back worsens with food intake. The increased intensity last for a few hours and will ease up. He denies any alcohol intake. He quit smoking about 8 years ago. Spaghetti will deftly trigger an episode of right upper quadrant pain. He denies any melena, hematochezia, acholic stools. He doesn't take NSAIDs on a frequent basis. He had a prior colonoscopy which showed possible Crohn's disease. He  generally alternates between diarrhea and constipation. He has had an appendectomy in the past. Labs did not reveal any evidence of pancreatitis, his comprehensive metabolic panel and CBC were also within normal limits.   Hospital Course:  The patient was brought in for planned laparoscopic cholecystectomy. Surgery was uneventful. Postoperatively in recovery room the patient was unable to keep liquids down and had persistent nausea so he was kept overnight for observation. He was maintained on C Pap. On the following morning he had some nausea but his vital signs are stable. Throughout the day he felt better and was able to tolerate to meals, ambulated in the hallways, pain controlled with oral medications and was discharged to home. He met discharge criteria.  BP 119/81 (BP Location: Right Arm)   Pulse 79   Temp 97.7 F (36.5 C) (Oral)   Resp 20   Ht _0  (1.727 m)   Wt 120.5 kg (265 lb 9.6 oz)   SpO2 93%   BMI 40.38 kg/m   Gen: alert, NAD, non-toxic appearing, morbidly obese Pupils: equal, no scleral icterus Pulm: Lungs clear to auscultation, symmetric chest rise CV: regular rate and rhythm Abd: soft, approp tender, nondistended. Dressing c/d/i. No cellulitis. No incisional hernia Ext: no edema, no calf tenderness Skin: no rash, no jaundice    Discharge Instructions  Discharge Instructions    Call MD for:    Complete by:  As directed    Temperature >101   Call MD for:  hives    Complete by:  As directed    Call MD for:  persistant dizziness or  light-headedness    Complete by:  As directed    Call MD for:  persistant nausea and vomiting    Complete by:  As directed    Call MD for:  redness, tenderness, or signs of infection (pain, swelling, redness, odor or green/yellow discharge around incision site)    Complete by:  As directed    Call MD for:  severe uncontrolled pain    Complete by:  As directed    Diet - low sodium heart healthy    Complete by:  As directed     Discharge instructions    Complete by:  As directed    See [facility] discharge instructions   Increase activity slowly    Complete by:  As directed      Allergies as of 02/04/2017      Reactions   Allegra [fexofenadine Hcl] Hives   Hctz [hydrochlorothiazide] Other (See Comments)   Weak and muscle cramps   Demerol [meperidine] Nausea And Vomiting   "Bad things" happen when I take it"      Medication List    STOP taking these medications   aspirin EC 81 MG tablet   metoprolol succinate 50 MG 24 hr tablet Commonly known as:  TOPROL-XL     TAKE these medications   ibuprofen 200 MG tablet Commonly known as:  ADVIL,MOTRIN Take 800 mg by mouth daily.   oxyCODONE 5 MG immediate release tablet Commonly known as:  Oxy IR/ROXICODONE Take 1-2 tablets (5-10 mg total) by mouth every 6 (six) hours as needed for moderate pain, severe pain or breakthrough pain.   pantoprazole 40 MG tablet Commonly known as:  PROTONIX Take 1 tablet (40 mg total) by mouth daily before breakfast.   rosuvastatin 10 MG tablet Commonly known as:  CRESTOR Take 1 tablet (10 mg total) by mouth daily. What changed:  when to take this   telmisartan 80 MG tablet Commonly known as:  MICARDIS Take 1 tablet (80 mg total) by mouth daily.      Follow-up Information    Greer Pickerel, MD. Schedule an appointment as soon as possible for a visit in 3 weeks.   Specialty:  General Surgery Why:  For wound re-check Contact information: [place] [address] [phone]            The results of significant diagnostics from this hospitalization (including imaging, microbiology, ancillary and laboratory) are listed below for reference.    Significant Diagnostic Studies: No results found.  Microbiology: No results found for this or any previous visit (from the past 240 hour(s)).   Labs: BMP Latest Ref Rng & Units 01/28/2017 12/21/2016 12/14/2016  Glucose 65 - 99 mg/dL 116(H) 104(H) 94  BUN  6 - 20 mg/dL _0 Creatinine 0.61 - 1.24 mg/dL 1.08 1.09 0.98  Sodium 135 - 145 mmol/L 138 138 138  Potassium 3.5 - 5.1 mmol/L 3.9 4.2 3.9  Chloride 101 - 111 mmol/L 105 102 104  CO2 22 - 32 mmol/L _1 Calcium 8.9 - 10.3 mg/dL 9.4 9.7 9.6   CBC Latest Ref Rng & Units 01/28/2017 12/14/2016 07/15/2014  WBC 4.0 - 10.5 K/uL 7.4 7.8 8.9  Hemoglobin 13.0 - 17.0 g/dL 13.8 15.2 14.2  Hematocrit 39.0 - 52.0 % 40.8 43.9 42.4  Platelets 150 - 400 K/uL 222 258.0 299.0    Active Problems:   S/P laparoscopic cholecystectomy   Time coordinating discharge: 15 minutes  Signed:  Leighton Ruff  [name], [facility], [place] 02/08/2017, 11:02 AM

## 2017-02-10 ENCOUNTER — Telehealth: Payer: Self-pay | Admitting: Internal Medicine

## 2017-02-10 NOTE — Telephone Encounter (Signed)
States he has two attending letter statements from Dr. Ronnald Ramp with surgery dates that is conflicting with the surgeons dates.  Is requesting call back for clarity.

## 2017-02-11 NOTE — Telephone Encounter (Signed)
Spoke to Disability. They wanted to confirm the surgery took place on 02/03/2017. I informed that the date was correct, clarified that the 01/07/2017 appointment was the consult only with the surgeon and also that there is to be a follow up appointment with the surgeon around 02/24/2017.

## 2017-02-25 NOTE — Addendum Note (Signed)
Addendum  created 02/25/17 1450 by Rica Koyanagi, MD   Sign clinical note

## 2017-03-08 ENCOUNTER — Other Ambulatory Visit: Payer: Self-pay | Admitting: Internal Medicine

## 2017-03-08 DIAGNOSIS — G4730 Sleep apnea, unspecified: Secondary | ICD-10-CM | POA: Insufficient documentation

## 2017-04-18 ENCOUNTER — Ambulatory Visit (INDEPENDENT_AMBULATORY_CARE_PROVIDER_SITE_OTHER): Payer: BC Managed Care – PPO | Admitting: Neurology

## 2017-04-18 ENCOUNTER — Encounter: Payer: Self-pay | Admitting: Neurology

## 2017-04-18 VITALS — BP 159/100 | HR 99 | Ht 69.0 in | Wt 258.0 lb

## 2017-04-18 DIAGNOSIS — G4719 Other hypersomnia: Secondary | ICD-10-CM | POA: Diagnosis not present

## 2017-04-18 DIAGNOSIS — R0681 Apnea, not elsewhere classified: Secondary | ICD-10-CM | POA: Diagnosis not present

## 2017-04-18 DIAGNOSIS — R51 Headache: Secondary | ICD-10-CM

## 2017-04-18 DIAGNOSIS — E669 Obesity, unspecified: Secondary | ICD-10-CM | POA: Diagnosis not present

## 2017-04-18 DIAGNOSIS — R519 Headache, unspecified: Secondary | ICD-10-CM

## 2017-04-18 DIAGNOSIS — G2581 Restless legs syndrome: Secondary | ICD-10-CM

## 2017-04-18 DIAGNOSIS — R0683 Snoring: Secondary | ICD-10-CM | POA: Diagnosis not present

## 2017-04-18 NOTE — Patient Instructions (Addendum)
Based on your symptoms and your exam I believe you are at risk for obstructive sleep apnea or OSA, and I think we should proceed with a sleep study to determine whether you do or do not have OSA and how severe it is. If you have more than mild OSA, I want you to consider treatment with CPAP. Please remember, the risks and ramifications of moderate to severe obstructive sleep apnea or OSA are: Cardiovascular disease, including congestive heart failure, stroke, difficult to control hypertension, arrhythmias, and even type 2 diabetes has been linked to untreated OSA. Sleep apnea causes disruption of sleep and sleep deprivation in most cases, which, in turn, can cause recurrent headaches, problems with memory, mood, concentration, focus, and vigilance. Most people with untreated sleep apnea report excessive daytime sleepiness, which can affect their ability to drive. Please do not drive if you feel sleepy.   I will likely see you back after your sleep study to go over the test results and where to go from there. We will call you after your sleep study to advise about the results (most likely, you will hear from Chauncey, my nurse) and to set up an appointment at the time, as necessary.    Our sleep lab administrative assistant, Arrie Aran will meet with you or call you to schedule your sleep study. If you don't hear back from her by next week please feel free to call her at [phone]. This is her direct line and please leave a message with your phone number to call back if you get the voicemail box. She will call back as soon as possible.

## 2017-04-18 NOTE — Progress Notes (Signed)
Subjective:    Patient ID: Taylor Schroeder is a 51 y.o. male.  HPI     Star Age, MD, PhD [facility] [address] [place], [place] [address]  Dear Dr. Ronnald Ramp,   I saw your patient, Taylor Schroeder, upon your kind request, in my neurologic clinic today for initial consultation of his sleep disorder, in particular, concern for underlying obstructive sleep apnea. The patient is unaccompanied today. As you know, Taylor Schroeder is a 51 year old right-handed gentleman with an underlying medical history of hyperlipidemia, B12 deficiency, status post recent laparoscopic cholecystectomy in May 2018, hypertension, reflux disease, arthritis affecting L shoulder with s/p arthroscopic surgery, and b/l knee pain with s/p L TKA, s/p R carpal tunnel surgery, and obesity, who reports snoring and excessive daytime somnolence. I reviewed your office note from 12/26/2016. His Epworth sleepiness score is 9 out of 24, fatigue score is 35 out of 63. He is married and lives with his wife. He works for [employer]. They have 2 children. He drinks alcohol rarely, quit smoking in 2009. He drinks coffee in the morning, typically 2 cups per day. Currently, no daily sodas or tea, has been trying to lose weight and has lost about 15 lb thus far. He does not currently have night to night nocturia, he has rare morning headaches. He has occasional RLS symptoms, about once a week.  He had lap chole on 02/03/17 under Dr. [name] and he was apparent noted to have apneas postoperatively and he was placed on CPAP overnight. His wife has noted apneas too.    His Past Medical History Is Significant For: Past Medical History:  Diagnosis Date  . Arthritis    "qwhere" (02/03/2017)  . BPH (benign prostatic hyperplasia)   . Childhood asthma    "as a baby"  . Coronary artery disease Non-obstructive   a. nonobs cath in 2006;  b. 01/2013 Cath: LM nl, LAD 33m, LCX nl, RCA non-dom, nl.  EF 60-65%.  . Crohn's ileitis - working dx 06/01/2013   "a mild case of it"  . Esophageal ring, acquired 07/12/2013  . GERD (gastroesophageal reflux disease)   . H/O hiatal hernia    "think so" (02/06/2014)  . Headache    "resolved w/BP control in 11/2015" (02/03/2017)  . High cholesterol   . Hypertension   . Obesity   . Pernicious anemia-B12 deficiency 04/18/2013  . PONV (postoperative nausea and vomiting) 02/03/2017; 08/2016    His Past Surgical History Is Significant For: Past Surgical History:  Procedure Laterality Date  . APPENDECTOMY    . CARDIAC CATHETERIZATION  01/2013   "results were clear"  . CARPAL TUNNEL RELEASE Right   . CHOLECYSTECTOMY N/A 02/03/2017   Procedure: LAPAROSCOPIC CHOLECYSTECTOMY;  Surgeon: Greer Pickerel, MD;  Location: [facility];  Service: General;  Laterality: N/A;  . COLONOSCOPY  ~ 2014  . ESOPHAGOGASTRODUODENOSCOPY  ~ 2014  . INGUINAL HERNIA REPAIR  1966   "? side"  . KNEE ARTHROPLASTY Left 02/06/2014   Procedure: COMPUTER ASSISTED TOTAL KNEE ARTHROPLASTY;  Surgeon: [name], MD;  Location: [facility];  Service: Orthopedics;  Laterality: Left;  Cemented Left Total Knee Arthroplasty  . KNEE ARTHROSCOPY Left 08/2016   scar tissue cleaned out  . LAPAROSCOPIC CHOLECYSTECTOMY  02/03/2017  . LEFT HEART CATHETERIZATION WITH CORONARY ANGIOGRAM N/A 01/29/2013   Procedure: LEFT HEART CATHETERIZATION WITH CORONARY ANGIOGRAM;  Surgeon: Thayer Headings, MD;  Location: [facility] CATH LAB;  Service: Cardiovascular;  Laterality: N/A;  . MULTIPLE TOOTH EXTRACTIONS  09/2016  . SHOULDER ARTHROSCOPY Left 03/12/2013  . TOTAL KNEE ARTHROPLASTY Left 02/06/2014    His Family History Is Significant For: Family History  Problem Relation Age of Onset  . Hypertension Mother   . Cancer Father        type unknown  . COPD Other   . Emphysema Maternal Grandmother   . Emphysema Maternal Grandfather   . Other Paternal Grandmother        spinal cancer  . Alcohol abuse Neg Hx   . Diabetes Neg  Hx   . Early death Neg Hx   . Heart disease Neg Hx   . Hyperlipidemia Neg Hx   . Kidney disease Neg Hx   . Stroke Neg Hx   . Colon cancer Neg Hx   . Stomach cancer Neg Hx   . Esophageal cancer Neg Hx   . Rectal cancer Neg Hx   . Liver cancer Neg Hx     His Social History Is Significant For: Social History   Social History  . Marital status: Married    Spouse name: N/A  . Number of children: 2  . Years of education: N/A   Occupational History  . [employer]   Social History Main Topics  . Smoking status: Former Smoker    Packs/day: 2.00    Years: 30.00    Types: Cigarettes    Quit date: 08/29/2008  . Smokeless tobacco: Never Used  . Alcohol use No     Comment: 02/03/2017 "maybe 12 pack of beer all at one time but only once/year"; 02/06/2014 "couple beers a couple times/month"  . Drug use: No  . Sexual activity: Yes   Other Topics Concern  . None   Social History Narrative   Married, 2 daughters   Biochemist, clinical    His Allergies Are:  Allergies  Allergen Reactions  . Allegra [Fexofenadine Hcl] Hives  . Hctz [Hydrochlorothiazide] Other (See Comments)    Weak and muscle cramps   . Demerol [Meperidine] Nausea And Vomiting    "Bad things" happen when I take it"  :   His Current Medications Are:  Outpatient Encounter Prescriptions as of 04/18/2017  Medication Sig  . ibuprofen (ADVIL,MOTRIN) 200 MG tablet Take 800 mg by mouth daily.  Marland Kitchen oxyCODONE (OXY IR/ROXICODONE) 5 MG immediate release tablet Take 1-2 tablets (5-10 mg total) by mouth every 6 (six) hours as needed for moderate pain, severe pain or breakthrough pain.  . pantoprazole (PROTONIX) 40 MG tablet Take 1 tablet (40 mg total) by mouth daily before breakfast.  . rosuvastatin (CRESTOR) 10 MG tablet Take 1 tablet (10 mg total) by mouth daily. (Patient taking differently: Take 10 mg by mouth at bedtime. )  . telmisartan (MICARDIS) 80 MG tablet Take 1 tablet (80 mg total) by mouth daily.    No facility-administered encounter medications on file as of 04/18/2017.   :  Review of Systems:  Out of a complete 14 point review of systems, all are reviewed and negative with the exception of these symptoms as listed below: Review of Systems  Neurological:       Pt presents today to discuss his sleep. Pt has never had a sleep study but does endorse snoring.  Epworth Sleepiness Scale 0= would never doze 1= slight chance of dozing 2= moderate chance of dozing 3= high chance of dozing  Sitting and reading: 2 Watching TV: 2 Sitting inactive in a public place (  ex. Theater or meeting): 0 As a passenger in a car for an hour without a break: 0 Lying down to rest in the afternoon: 3 Sitting and talking to someone: 0 Sitting quietly after lunch (no alcohol): 2 In a car, while stopped in traffic: 0 Total: 9     Objective:  Neurological Exam  Physical Exam Physical Examination:   Vitals:   04/18/17 1612  BP: (!) 159/100  Pulse: 99    General Examination: The patient is a very pleasant 51 y.o. male in no acute distress. He appears well-developed and well-nourished and adequately groomed.   HEENT: Normocephalic, atraumatic, pupils are equal, round and reactive to light and accommodation. Funduscopic exam is normal with sharp disc margins noted. Extraocular tracking is good without limitation to gaze excursion or nystagmus noted. Normal smooth pursuit is noted. Hearing is grossly intact. Face is symmetric with normal facial animation and normal facial sensation. Speech is clear with no dysarthria noted. There is no hypophonia. There is no lip, neck/head, jaw or voice tremor. Neck is supple with full range of passive and active motion. There are no carotid bruits on auscultation. Oropharynx exam reveals: no significant mouth dryness, adequate dental hygiene and moderate airway crowding, due to larger uvula, tonsils of 2+, right a little bigger than L. Mallampati is class I. Tongue  protrudes centrally and palate elevates symmetrically. Neck size is 19.25 inches. He has a slight underbite.   Chest: Clear to auscultation without wheezing, rhonchi or crackles noted.  Heart: S1+S2+0, regular and normal without murmurs, rubs or gallops noted.   Abdomen: Soft, non-tender and non-distended with normal bowel sounds appreciated on auscultation.  Extremities: There is no pitting edema in the distal lower extremities bilaterally. Pedal pulses are intact.  Skin: Warm and dry without trophic changes noted. There are no varicose veins.  Musculoskeletal: exam reveals; L shoulder pain and R knee pain, bow legged b/l.   Neurologically:  Mental status: The patient is awake, alert and oriented in all 4 spheres. His immediate and remote memory, attention, language skills and fund of knowledge are appropriate. There is no evidence of aphasia, agnosia, apraxia or anomia. Speech is clear with normal prosody and enunciation. Thought process is linear. Mood is normal and affect is normal.  Cranial nerves II - XII are as described above under HEENT exam. In addition: shoulder shrug is normal with equal shoulder height noted. Motor exam: Normal bulk, strength and tone is noted. There is no drift, tremor or rebound. Romberg is negative. Reflexes are 2+ throughout. Fine motor skills and coordination: intact with normal finger taps, normal hand movements, normal rapid alternating patting, normal foot taps and normal foot agility.  Cerebellar testing: No dysmetria or intention tremor on finger to nose testing. Heel to shin is unremarkable bilaterally. There is no truncal or gait ataxia.  Sensory exam: intact to light touch in the upper and lower extremities.  Gait, station and balance: He stands easily. No veering to one side is noted. No leaning to one side is noted. Posture is age-appropriate and stance is narrow based. Gait shows normal stride length and normal pace, mild limp on the R.   Assessment  and Plan:  In summary, Taylor Schroeder is a very pleasant 51 y.o.-year old male with an underlying medical history of hyperlipidemia, B12 deficiency, status post recent laparoscopic cholecystectomy in May 2018, hypertension, reflux disease, arthritis affecting L shoulder with s/p arthroscopic surgery, and b/l knee pain with s/p L TKA, s/p R carpal  tunnel surgery, and obesity, whose history and physical exam are concerning for obstructive sleep apnea (OSA). In addition, he endorses restless leg syndrome type symptoms which are intermittent currently.  I had a long chat with the patient about my findings and the diagnosis of OSA, its prognosis and treatment options. We talked about medical treatments, surgical interventions and non-pharmacological approaches. I explained in particular the risks and ramifications of untreated moderate to severe OSA, especially with respect to developing cardiovascular disease down the Road, including congestive heart failure, difficult to treat hypertension, cardiac arrhythmias, or stroke. Even type 2 diabetes has, in part, been linked to untreated OSA. Symptoms of untreated OSA include daytime sleepiness, memory problems, mood irritability and mood disorder such as depression and anxiety, lack of energy, as well as recurrent headaches, especially morning headaches. We talked about trying to maintain a healthy lifestyle in general, as well as the importance of weight control. I encouraged the patient to eat healthy, exercise daily and keep well hydrated, to keep a scheduled bedtime and wake time routine, to not skip any meals and eat healthy snacks in between meals. I advised the patient not to drive when feeling sleepy. I recommended the following at this time: sleep study with potential positive airway pressure titration. (We will score hypopneas at 3%).   I explained the sleep test procedure to the patient and also outlined possible surgical and non-surgical treatment options  of OSA, including the use of a custom-made dental device (which would require a referral to a specialist dentist or oral surgeon), upper airway surgical options, such as pillar implants, radiofrequency surgery, tongue base surgery, and UPPP (which would involve a referral to an ENT surgeon). Rarely, jaw surgery such as mandibular advancement may be considered.  I also explained the CPAP treatment option to the patient, who indicated that he would be willing to try CPAP if the need arises. I explained the importance of being compliant with PAP treatment, not only for insurance purposes but primarily to improve His symptoms, and for the patient's long term health benefit, including to reduce His cardiovascular risks. I answered all his questions today and the patient was in agreement. I would like to see him back after the sleep study is completed and encouraged him to call with any interim questions, concerns, problems or updates.   Thank you very much for allowing me to participate in the care of this nice patient. If I can be of any further assistance to you please do not hesitate to call me at [phone].  Sincerely,   Star Age, MD, PhD

## 2017-04-28 ENCOUNTER — Ambulatory Visit (INDEPENDENT_AMBULATORY_CARE_PROVIDER_SITE_OTHER): Payer: BC Managed Care – PPO | Admitting: Neurology

## 2017-04-28 DIAGNOSIS — G4720 Circadian rhythm sleep disorder, unspecified type: Secondary | ICD-10-CM

## 2017-04-28 DIAGNOSIS — G4733 Obstructive sleep apnea (adult) (pediatric): Secondary | ICD-10-CM | POA: Diagnosis not present

## 2017-04-29 NOTE — Procedures (Signed)
PATIENT'S NAME:  Houston, Zapien DOB:      06-15-1966      MR#:    [id number]     DATE OF RECORDING: 04/28/2017 REFERRING M.D.:  Scarlette Calico, MD Study Performed:  Split-Night Titration Study HISTORY: 51 year old right-handed gentleman with an underlying medical history of hyperlipidemia, B12 deficiency, status post recent laparoscopic cholecystectomy in May 2018, hypertension, reflux disease, arthritis affecting L shoulder with s/p arthroscopic surgery, and b/l knee pain with s/p L TKA, s/p R carpal tunnel surgery, and obesity, who reports snoring and excessive daytime somnolence. The patient endorsed the Epworth Sleepiness Scale at 9 points. The patient's weight 258 pounds with a height of 69 (inches), resulting in a BMI of 38.2 kg/m2. The patient's neck circumference measured 19 inches.  CURRENT MEDICATIONS: Oxycodon, Advil, Protonix, Crerstor  PROCEDURE:  This is a multichannel digital polysomnogram utilizing the Somnostar 11.2 system.  Electrodes and sensors were applied and monitored per AASM Specifications.   EEG, EOG, Chin and Limb EMG, were sampled at 200 Hz.  ECG, Snore and Nasal Pressure, Thermal Airflow, Respiratory Effort, CPAP Flow and Pressure, Oximetry was sampled at 50 Hz. Digital video and audio were recorded.      BASELINE STUDY WITHOUT CPAP RESULTS:  Lights Out was at 20:54 and Lights On at 03:45 for the night, split study start at 23:15, epoch 288. Total recording time (TRT) was 150.5, with a total sleep time (TST) of 129 minutes.   The patient's sleep latency was 22 minutes.  REM latency was 104 minutes.  The sleep efficiency was 85.7 %.    SLEEP ARCHITECTURE: WASO (Wake after sleep onset) was 11 minutes with mild sleep fragmentation noted, Stage N1 was 8.5 minutes, Stage N2 was 117 minutes, Stage N3 was 0 minutes and Stage R (REM sleep) was 3.5 minutes.  The percentages were Stage N1 6.6%, Stage N2 90.7%, which is markedly increased, Stage N3 was absent, and Stage R (REM sleep)  was 2.7%.  The arousals were noted as: 12 were spontaneous, 0 were associated with PLMs, 142 were associated with respiratory events.  Audio and video analysis did not show any abnormal or unusual movements, behaviors, phonations or vocalizations.  The patient took 2 bathroom breaks for the night. Mild to moderate snoring was noted.  EKG was in keeping with normal sinus rhythm (NSR).   RESPIRATORY ANALYSIS:  There were a total of 150 respiratory events:  84 obstructive apneas, 0 central apneas and 0 mixed apneas with a total of 84 apneas and an apnea index (AI) of 39.1. There were 66 hypopneas with a hypopnea index of 30.7. The patient also had 0 respiratory event related arousals (RERAs).  Snoring was noted.     The total APNEA/HYPOPNEA INDEX (AHI) was 69.8 /hour and the total RESPIRATORY DISTURBANCE INDEX was 69.8 /hour.  2 events occurred in REM sleep and 132 events in NREM. The REM AHI was 34.3, /hour versus a non-REM AHI of 70.8 /hour. The patient spent 263.5 minutes sleep time in the supine position 61 minutes in non-supine. The supine AHI was 62.8 /hour versus a non-supine AHI of 89.5 /hour.  OXYGEN SATURATION & C02:  The wake baseline 02 saturation was 94%, with the lowest being 62%. Time spent below 89% saturation equaled 96 minutes.  PERIODIC LIMB MOVEMENTS: The patient had a total of 0 Periodic Limb Movements.  The Periodic Limb Movement (PLM) index was 0 /hour and the PLM Arousal index was 0 /hour.  TITRATION STUDY WITH CPAP RESULTS:  The patient was fitted with a medium nasal mask. CPAP was initiated at 5 cmH20 with heated humidity per AASM split night standards and pressure was advanced to 9 cmH20 because of hypopneas, apneas and desaturations.  At a PAP pressure of 9 cmH20, there was a reduction of the AHI to 0/hour with supine NREM sleep achieved, O2 nadir of 86%.   Total recording time (TRT) was 261 minutes, with a total sleep time (TST) of 195.5 minutes. The patient's sleep  latency was 21.5 minutes. REM latency was 72 minutes.  The sleep efficiency was 74.9 %.    SLEEP ARCHITECTURE: Wake after sleep was 51 minutes, Stage N1 9 minutes, Stage N2 121 minutes, Stage N3 14 minutes and Stage R (REM sleep) 51.5 minutes. The percentages were: Stage N1 4.6%, Stage N2 61.9%, Stage N3 7.2% and Stage R (REM sleep) 26.3%.  The arousals were noted as: 23 were spontaneous, 0 were associated with PLMs, 5 were associated with respiratory events.  RESPIRATORY ANALYSIS:  There were a total of 15 respiratory events: 3 obstructive apneas, 0 central apneas and 0 mixed apneas with a total of 3 apneas and an apnea index (AI) of .9. There were 12 hypopneas with a hypopnea index of 3.7 /hour. The patient also had 0 respiratory event related arousals (RERAs).      The total APNEA/HYPOPNEA INDEX  (AHI) was 4.6 /hour and the total RESPIRATORY DISTURBANCE INDEX was 4.6 /hour.  11 events occurred in REM sleep and 4 events in NREM. The REM AHI was 12.8 /hour versus a non-REM AHI of 1.7 /hour. REM sleep was achieved on a pressure of  cm/h2o (AHI was  .) The patient spent 86% of total sleep time in the supine position. The supine AHI was 5.4 /hour, versus a non-supine AHI of 0.0/hour.  OXYGEN SATURATION & C02:  The wake baseline 02 saturation was 94%, with the lowest being 83%. Time spent below 89% saturation equaled 29 minutes.  PERIODIC LIMB MOVEMENTS: The patient had a total of 0 Periodic Limb Movements. The Periodic Limb Movement (PLM) index was 0 /hour and the PLM Arousal index was 0 /hour.  Post-study, the patient indicated that sleep was the same as usual.    POLYSOMNOGRAPHY IMPRESSION :   1. Obstructive Sleep Apnea(OSA)  2. Dysfunctions associated with sleep stages or arousals from sleep  RECOMMENDATIONS:  1. This patient has severe obstructive sleep apnea and responded fairly well on CPAP therapy. Due to the final O2 nadir of 86% on a pressure of 9 cm and no REM sleep achieved on the  final pressure, I will recommend and a home CPAP pressure of 10 via medium nasal mask with heated humidity. The patient should be reminded to be fully compliant with PAP therapy to improve sleep related symptoms and decrease long term cardiovascular risks. Please note that untreated obstructive sleep apnea carries additional perioperative morbidity. Patients with significant obstructive sleep apnea should receive perioperative PAP therapy and the surgeons and particularly the anesthesiologist should be informed of the diagnosis and the severity of the sleep disordered breathing. 2. This study shows sleep fragmentation and abnormal sleep stage percentages; these are nonspecific findings and per se do not signify an intrinsic sleep disorder or a cause for the patient's sleep-related symptoms. Causes include (but are not limited to) the first night effect of the sleep study, circadian rhythm disturbances, medication effect or an underlying mood disorder or medical problem.  3. The patient should be cautioned not to drive, work at  heights, or operate dangerous or heavy equipment when tired or sleepy. Review and reiteration of good sleep hygiene measures should be pursued with any patient. 4. The patient will be seen in follow-up by Dr. Rexene Alberts at [facility] for discussion of the test results and further management strategies. The referring provider will be notified of the test results.  I certify that I have reviewed the entire raw data recording prior to the issuance of this report in accordance with the Standards of Accreditation of the American Academy of Sleep Medicine (AASM)   Star Age, MD, PhD Diplomat, American Board of Psychiatry and Neurology (Neurology and Sleep Medicine)

## 2017-04-29 NOTE — Progress Notes (Signed)
  Patient referred by Dr. Ronnald Ramp, seen by me on 04/18/17, split study on 04/28/17:  Please call and notify patient that the recent sleep study confirmed the diagnosis of severe OSA. He did well with CPAP during the study with significant improvement of the respiratory events. Therefore, I would like start the patient on CPAP therapy at home by prescribing a machine for home use. I placed the order in the chart. The patient will need a follow up appointment with me in 10 weeks post set up that has to be scheduled; please go ahead and schedule while you have the patient on the phone and make sure patient understands the importance of keeping this window for the FU appointment, as it is often an insurance requirement and failing to adhere to this may result in losing coverage for sleep apnea treatment.  Please re-enforce the importance of compliance with treatment and the need for [name] to monitor compliance data - again an insurance requirement and good feedback for the patient as far as how they are doing.  Also remind patient, that any upcoming CPAP machine or mask issues, should be first addressed with the DME company. Please ask if patient has a preference regarding DME company.  Please arrange for CPAP set up at home through a DME company of patient's choice - once you have spoken to the patient - and faxed/routed report to PCP and referring MD (if other than PCP), you can close this encounter, thanks,   Star Age, MD, PhD [facility] ([place])

## 2017-04-29 NOTE — Addendum Note (Signed)
Addended by: Star Age on: 04/29/2017 04:31 PM   Modules accepted: Orders

## 2017-05-03 ENCOUNTER — Telehealth: Payer: Self-pay

## 2017-05-03 NOTE — Telephone Encounter (Signed)
I called pt. I advised pt that Dr. Rexene Alberts reviewed their sleep study results and found that pt has severe osa but did well with a cpap. Dr. Rexene Alberts recommends that pt start a cpap at home. I reviewed PAP compliance expectations with the pt. Pt is agreeable to starting a CPAP. I advised pt that an order will be sent to a DME, [facility], and [facility] will call the pt within about one week after they file with the pt's insurance. [facility] will show the pt how to use the machine, fit for masks, and troubleshoot the CPAP if needed. A follow up appt was made for insurance purposes with Dr. Rexene Alberts on 07/12/17 at 3:00pm. Pt verbalized understanding to arrive 15 minutes early and bring their CPAP. A letter with all of this information in it will be mailed to the pt as a reminder. I verified with the pt that the address we have on file is correct. Pt verbalized understanding of results. Pt had no questions at this time but was encouraged to call back if questions arise.

## 2017-05-03 NOTE — Telephone Encounter (Signed)
-----   Message from Star Age, MD sent at 04/29/2017  4:31 PM EDT -----  Patient referred by Dr. Ronnald Ramp, seen by me on 04/18/17, split study on 04/28/17:  Please call and notify patient that the recent sleep study confirmed the diagnosis of severe OSA. He did well with CPAP during the study with significant improvement of the respiratory events. Therefore, I would like start the patient on CPAP therapy at home by prescribing a machine for home use. I placed the order in the chart. The patient will need a follow up appointment with me in 10 weeks post set up that has to be scheduled; please go ahead and schedule while you have the patient on the phone and make sure patient understands the importance of keeping this window for the FU appointment, as it is often an insurance requirement and failing to adhere to this may result in losing coverage for sleep apnea treatment.  Please re-enforce the importance of compliance with treatment and the need for [name] to monitor compliance data - again an insurance requirement and good feedback for the patient as far as how they are doing.  Also remind patient, that any upcoming CPAP machine or mask issues, should be first addressed with the DME company. Please ask if patient has a preference regarding DME company.  Please arrange for CPAP set up at home through a DME company of patient's choice - once you have spoken to the patient - and faxed/routed report to PCP and referring MD (if other than PCP), you can close this encounter, thanks,   Star Age, MD, PhD [facility] ([place])

## 2017-05-03 NOTE — Telephone Encounter (Signed)
I called pt to discuss his sleep study results. No answer, left a message asking him to call me back. 

## 2017-05-27 NOTE — Progress Notes (Signed)
Chief Complaint  Patient presents with  . Hypertension    pt reports BP has been elevated... x 1 week dizziness...headaches and increase in heartrate...denies cardiac Sx    HPI: Taylor Schroeder 51 y.o. Patient comes in today for [facility] Saturday clinic for  new problem evaluation. Hs ht and under rx for  OSA  Eva;luation.  Sleep study  Noted bp readings were very high  And at [facility]iro[facility]  It was down .    Can tell when goes up.   Says irritable when high . And had headache.  High 156/101   Taking telmisartan   Now to get cpap Tuesday .  Felt badly this week and had near syncope when working  Outside [place] over 90 degress despite [place] now  ? What to do no cp sob now   ROS: See pertinent positives and negatives per HPI. No fever v cp sob new   Past Medical History:  Diagnosis Date  . Arthritis    "qwhere" (02/03/2017)  . BPH (benign prostatic hyperplasia)   . Childhood asthma    "as a baby"  . Coronary artery disease Non-obstructive   a. nonobs cath in 2006;  b. 01/2013 Cath: LM nl, LAD 11m, LCX nl, RCA non-dom, nl. EF 60-65%.  . Crohn's ileitis - working dx 06/01/2013   "a mild case of it"  . Esophageal ring, acquired 07/12/2013  . GERD (gastroesophageal reflux disease)   . H/O hiatal hernia    "think so" (02/06/2014)  . Headache    "resolved w/BP control in 11/2015" (02/03/2017)  . High cholesterol   . Hypertension   . Obesity   . Pernicious anemia-B12 deficiency 04/18/2013  . PONV (postoperative nausea and vomiting) 02/03/2017; 08/2016    Family History  Problem Relation Age of Onset  . Hypertension Mother   . Cancer Father        type unknown  . COPD Other   . Emphysema Maternal Grandmother   . Emphysema Maternal Grandfather   . Other Paternal Grandmother        spinal cancer  . Alcohol abuse Neg Hx   . Diabetes Neg Hx   . Early death Neg Hx   . Heart disease Neg Hx   . Hyperlipidemia Neg Hx   . Kidney disease Neg Hx   . Stroke Neg Hx   . Colon  cancer Neg Hx   . Stomach cancer Neg Hx   . Esophageal cancer Neg Hx   . Rectal cancer Neg Hx   . Liver cancer Neg Hx     Social History   Social History  . Marital status: Married    Spouse name: N/A  . Number of children: 2  . Years of education: N/A   Occupational History  . [employer]   Social History Main Topics  . Smoking status: Former Smoker    Packs/day: 2.00    Years: 30.00    Types: Cigarettes    Quit date: 08/29/2008  . Smokeless tobacco: Never Used  . Alcohol use No     Comment: 02/03/2017 "maybe 12 pack of beer all at one time but only once/year"; 02/06/2014 "couple beers a couple times/month"  . Drug use: No  . Sexual activity: Yes   Other Topics Concern  . None   Social History Narrative   Married, 2 daughters   Biochemist, clinical    Outpatient Medications Prior to Visit  Medication Sig Dispense  Refill  . ibuprofen (ADVIL,MOTRIN) 200 MG tablet Take 800 mg by mouth daily.    Marland Kitchen oxyCODONE (OXY IR/ROXICODONE) 5 MG immediate release tablet Take 1-2 tablets (5-10 mg total) by mouth every 6 (six) hours as needed for moderate pain, severe pain or breakthrough pain. 30 tablet 0  . pantoprazole (PROTONIX) 40 MG tablet Take 1 tablet (40 mg total) by mouth daily before breakfast. 90 tablet 3  . rosuvastatin (CRESTOR) 10 MG tablet Take 1 tablet (10 mg total) by mouth daily. (Patient taking differently: Take 10 mg by mouth at bedtime. ) 90 tablet 3  . telmisartan (MICARDIS) 80 MG tablet Take 1 tablet (80 mg total) by mouth daily. 90 tablet 1   No facility-administered medications prior to visit.      EXAM:  BP 138/78   Pulse 83   Temp 97.8 F (36.6 C) (Oral)   Wt 262 lb (118.8 kg)   SpO2 97%   BMI 38.69 kg/m   Body mass index is 38.69 kg/m.  GENERAL: vitals reviewed and listed above, alert, oriented, appears well hydrated and in no acute distress HEENT: atraumatic, conjunctiva  clear, no obvious abnormalities on inspection of  external nose and ears  NECK: no obvious masses on inspection palpation  LUNGS: clear to auscultation bilaterally, no wheezes, rales or rhonchi, good air movement CV: HRRR, no clubbing cyanosis or  peripheral edema nl cap refill  Abdomen:  Sof,t normal bowel sounds without hepatosplenomegaly, no guarding rebound or masses no CVA tenderness MS: moves all extremities without noticeable focal  abnormality PSYCH: pleasant and cooperative, no obvious depression or anxiety Lab Results  Component Value Date   WBC 7.4 01/28/2017   HGB 13.8 01/28/2017   HCT 40.8 01/28/2017   PLT 222 01/28/2017   GLUCOSE 116 (H) 01/28/2017   CHOL 227 (H) 12/21/2016   TRIG 270.0 (H) 12/21/2016   HDL 41.80 12/21/2016   LDLDIRECT 144.0 12/21/2016   LDLCALC 143 (H) 07/15/2014   ALT 41 12/21/2016   AST 23 12/21/2016   NA 138 01/28/2017   K 3.9 01/28/2017   CL 105 01/28/2017   CREATININE 1.08 01/28/2017   BUN 16 01/28/2017   CO2 23 01/28/2017   TSH 2.34 07/15/2014   PSA 2.49 12/21/2016   INR 0.94 01/30/2014   HGBA1C 5.5 07/15/2014   BP Readings from Last 3 Encounters:  05/28/17 138/78  04/18/17 (!) 159/100  02/04/17 119/81   Wt Readings from Last 3 Encounters:  05/28/17 262 lb (118.8 kg)  04/18/17 258 lb (117 kg)  02/03/17 265 lb 9.6 oz (120.5 kg)    ASSESSMENT AND PLAN:  Discussed the following assessment and plan:  Essential hypertension  OSA (obstructive sleep apnea)  Heat effect, initial encounter - suspect cause this week of feeling bad  counseld  May need augmentation of bp  meds but not enough information and disc effects of untreated OSA and danger alarm sx .  Check readings and bring in monitor to visit with pcp to discuss.   Also   Avoidance of heat  Exhaustion may be in order and patient agrees.  Total visit 61mins > 50% spent counseling and coordinating care as indicated in above note and in instructions to patient .   Can discuss  Weight management with pcp .   -Patient advised  to return or notify health care team  if symptoms worsen ,persist or new concerns arise.  Patient Instructions  Make appt with PCP  In FU.  About bp and symptoms  The sx you have may have been   Heat related    And OSA .  Take blood pressure readings twice a day for 7- 10 days and  brin gin monitor to your next visit to correlate .   May advise further  medication   .record.  in readings      bp reading  today was 138/78  .  Long term goal  120/80    Heat Exhaustion Information WHAT IS HEAT EXHAUSTION? Heat exhaustion happens when your body gets overheated from hot weather or from exercise. Heat exhaustion can lead to heat stroke, a life-threatening condition that requires emergency care. Heat exhaustion is more likely to develop when:  You are exercising or being active.  You are in hot or humid weather.  You are in bright sunshine.  You are not drinking enough water.  WHO IS AT RISK FOR THIS CONDITION? This condition is more likely to develop in:  People who exercise in hot or humid weather.  People who exercise beyond their fitness level.  People who wear clothing that does not allow sweat to evaporate.  People who are dehydrated.  People who drink a lot of alcoholic beverages or beverages that have caffeine. This can lead to dehydration.  People who are age 15 or older.  Children.  People who have a medical condition such as heart disease, poor circulation, sickle cell disease, or high blood pressure.  People who have a fever.  People who are very overweight (obese).  WHAT ARE THE SYMPTOMS OF THIS CONDITION? Symptoms of heat exhaustion include:  Heavy sweating along with feeling weak, dizzy, light-headed, and nauseous.  Rapid heartbeat.  Headache.  Urine that is darker than normal.  Muscle cramps, such as in the leg or side (flank).  Moist, cool, and clammy skin.  Fatigue.  Thirst.  Confusion.  Fainting.  WHAT SHOULD I DO IF I THINK I HAVE  THIS CONDITION? If you think that you have heat exhaustion, call your health care provider. Follow his or her instructions. You should also:  Call a friend or a family member and ask him or her to stay with you.  Move to a cooler location, such as: ? Into the shade. ? In front of a fan. ? An air-conditioned space.  Lie down and rest.  Slowly drink nonalcoholic, caffeine-free fluids.  Take off tight clothing or extra clothing.  Take a cool bath or shower, if possible. If you do not have access to a bath or shower, dab or mist cool water on your skin.  WHY IS IT IMPORTANT TO TREAT THIS CONDITION? It is important to take care of yourself and treat heat exhaustion as soon as possible. Untreated heat exhaustion can turn into heat stroke, which is a life-threatening condition that requires urgent medical treatment. HOW CAN I PREVENT THIS CONDITION? To prevent this condition:  Drink enough fluid to keep your urine clear or pale yellow. This helps your body to sweat properly.  Avoid outdoor activities on very hot or humid days.  Do not exercise or do other physical activity when you are not feeling well.  Take breaks often during physical activity.  Wear light-colored, loose-fitting, and lightweight clothing when it is hot outside.  Wear a hat and use sunscreen when exercising outdoors.  Avoid being outside during the hottest times of the day.  Check with your health care provider before you start any new activity, especially if you take medicine or have a medical condition.  Start any new activity slowly and work up to your fitness level.  HOW CAN I HELP TO PROTECT ELDERLY RELATIVES AND NEIGHBORS FROM THIS CONDITION? People who are age 63 or older are at greater risk for heat exhaustion. Their bodies have a harder time adjusting to heat. They are also more likely to have a medical condition or be on medicines that increase their risk for heat exhaustion. They may get heat  exhaustion indoors if the heat is high for several days. You can help to protect them during hot weather by:  Checking on them two or more times each day.  Making sure that they are drinking plenty of cool, nonalcoholic, and caffeine-free fluids.  Making sure that they use their air conditioner.  Taking them to a location where air conditioning is available.  Talking with their health care provider about their medical needs, medicines, and fluid requirements.  SEEK MEDICAL CARE IF:  Your symptoms last longer than 30 minutes.  SEEK IMMEDIATE MEDICAL CARE IF:  You have any symptoms of heat stroke. These include: ? Fever. ? Vomiting. ? Red skin. ? Inability to sweat, resulting in hot, dry skin. ? Excessive thirst. ? Rapid breathing. ? Headache. ? Confusion or disorientation. ? Fainting. ? Seizures. These symptoms may represent a serious problem that is an emergency. Do not wait to see if the symptoms will go away. Get medical help right away. Call your local emergency services (911 in the [place] Do not drive yourself to the hospital. This information is not intended to replace advice given to you by your health care provider. Make sure you discuss any questions you have with your health care provider. Document Released: 06/22/2008 Document Revised: 04/02/2016 Document Reviewed: 01/04/2016 Elsevier Interactive Patient Education  2018 [place]. Ericson Nafziger M.D.

## 2017-05-28 ENCOUNTER — Encounter: Payer: Self-pay | Admitting: Internal Medicine

## 2017-05-28 ENCOUNTER — Ambulatory Visit (INDEPENDENT_AMBULATORY_CARE_PROVIDER_SITE_OTHER): Payer: BC Managed Care – PPO | Admitting: Internal Medicine

## 2017-05-28 VITALS — BP 138/78 | HR 83 | Temp 97.8°F | Wt 262.0 lb

## 2017-05-28 DIAGNOSIS — I10 Essential (primary) hypertension: Secondary | ICD-10-CM | POA: Diagnosis not present

## 2017-05-28 DIAGNOSIS — G4733 Obstructive sleep apnea (adult) (pediatric): Secondary | ICD-10-CM | POA: Diagnosis not present

## 2017-05-28 DIAGNOSIS — T679XXA Effect of heat and light, unspecified, initial encounter: Secondary | ICD-10-CM | POA: Diagnosis not present

## 2017-05-28 NOTE — Patient Instructions (Addendum)
Make appt with PCP  In FU.  About bp and symptoms  The sx you have may have been   Heat related    And OSA .  Take blood pressure readings twice a day for 7- 10 days and  brin gin monitor to your next visit to correlate .   May advise further  medication   .record.  in readings      bp reading  today was 138/78  .  Long term goal  120/80    Heat Exhaustion Information WHAT IS HEAT EXHAUSTION? Heat exhaustion happens when your body gets overheated from hot weather or from exercise. Heat exhaustion can lead to heat stroke, a life-threatening condition that requires emergency care. Heat exhaustion is more likely to develop when:  You are exercising or being active.  You are in hot or humid weather.  You are in bright sunshine.  You are not drinking enough water.  WHO IS AT RISK FOR THIS CONDITION? This condition is more likely to develop in:  People who exercise in hot or humid weather.  People who exercise beyond their fitness level.  People who wear clothing that does not allow sweat to evaporate.  People who are dehydrated.  People who drink a lot of alcoholic beverages or beverages that have caffeine. This can lead to dehydration.  People who are age 52 or older.  Children.  People who have a medical condition such as heart disease, poor circulation, sickle cell disease, or high blood pressure.  People who have a fever.  People who are very overweight (obese).  WHAT ARE THE SYMPTOMS OF THIS CONDITION? Symptoms of heat exhaustion include:  Heavy sweating along with feeling weak, dizzy, light-headed, and nauseous.  Rapid heartbeat.  Headache.  Urine that is darker than normal.  Muscle cramps, such as in the leg or side (flank).  Moist, cool, and clammy skin.  Fatigue.  Thirst.  Confusion.  Fainting.  WHAT SHOULD I DO IF I THINK I HAVE THIS CONDITION? If you think that you have heat exhaustion, call your health care provider. Follow his or her  instructions. You should also:  Call a friend or a family member and ask him or her to stay with you.  Move to a cooler location, such as: ? Into the shade. ? In front of a fan. ? An air-conditioned space.  Lie down and rest.  Slowly drink nonalcoholic, caffeine-free fluids.  Take off tight clothing or extra clothing.  Take a cool bath or shower, if possible. If you do not have access to a bath or shower, dab or mist cool water on your skin.  WHY IS IT IMPORTANT TO TREAT THIS CONDITION? It is important to take care of yourself and treat heat exhaustion as soon as possible. Untreated heat exhaustion can turn into heat stroke, which is a life-threatening condition that requires urgent medical treatment. HOW CAN I PREVENT THIS CONDITION? To prevent this condition:  Drink enough fluid to keep your urine clear or pale yellow. This helps your body to sweat properly.  Avoid outdoor activities on very hot or humid days.  Do not exercise or do other physical activity when you are not feeling well.  Take breaks often during physical activity.  Wear light-colored, loose-fitting, and lightweight clothing when it is hot outside.  Wear a hat and use sunscreen when exercising outdoors.  Avoid being outside during the hottest times of the day.  Check with your health care provider before you start  any new activity, especially if you take medicine or have a medical condition.  Start any new activity slowly and work up to your fitness level.  HOW CAN I HELP TO PROTECT ELDERLY RELATIVES AND NEIGHBORS FROM THIS CONDITION? People who are age 73 or older are at greater risk for heat exhaustion. Their bodies have a harder time adjusting to heat. They are also more likely to have a medical condition or be on medicines that increase their risk for heat exhaustion. They may get heat exhaustion indoors if the heat is high for several days. You can help to protect them during hot weather by:  Checking  on them two or more times each day.  Making sure that they are drinking plenty of cool, nonalcoholic, and caffeine-free fluids.  Making sure that they use their air conditioner.  Taking them to a location where air conditioning is available.  Talking with their health care provider about their medical needs, medicines, and fluid requirements.  SEEK MEDICAL CARE IF:  Your symptoms last longer than 30 minutes.  SEEK IMMEDIATE MEDICAL CARE IF:  You have any symptoms of heat stroke. These include: ? Fever. ? Vomiting. ? Red skin. ? Inability to sweat, resulting in hot, dry skin. ? Excessive thirst. ? Rapid breathing. ? Headache. ? Confusion or disorientation. ? Fainting. ? Seizures. These symptoms may represent a serious problem that is an emergency. Do not wait to see if the symptoms will go away. Get medical help right away. Call your local emergency services (911 in the [place] Do not drive yourself to the hospital. This information is not intended to replace advice given to you by your health care provider. Make sure you discuss any questions you have with your health care provider. Document Released: 06/22/2008 Document Revised: 04/02/2016 Document Reviewed: 01/04/2016 Elsevier Interactive Patient Education  [employer].

## 2017-06-13 ENCOUNTER — Other Ambulatory Visit (INDEPENDENT_AMBULATORY_CARE_PROVIDER_SITE_OTHER): Payer: BC Managed Care – PPO

## 2017-06-13 ENCOUNTER — Encounter: Payer: Self-pay | Admitting: Internal Medicine

## 2017-06-13 ENCOUNTER — Ambulatory Visit (INDEPENDENT_AMBULATORY_CARE_PROVIDER_SITE_OTHER): Payer: BC Managed Care – PPO | Admitting: Internal Medicine

## 2017-06-13 VITALS — BP 130/80 | HR 90 | Temp 98.2°F | Resp 16 | Ht 69.0 in | Wt 265.0 lb

## 2017-06-13 DIAGNOSIS — I10 Essential (primary) hypertension: Secondary | ICD-10-CM | POA: Diagnosis not present

## 2017-06-13 DIAGNOSIS — R739 Hyperglycemia, unspecified: Secondary | ICD-10-CM

## 2017-06-13 DIAGNOSIS — Z23 Encounter for immunization: Secondary | ICD-10-CM | POA: Diagnosis not present

## 2017-06-13 DIAGNOSIS — M19012 Primary osteoarthritis, left shoulder: Secondary | ICD-10-CM | POA: Diagnosis not present

## 2017-06-13 LAB — BASIC METABOLIC PANEL
BUN: 18 mg/dL (ref 6–23)
CALCIUM: 9.1 mg/dL (ref 8.4–10.5)
CO2: 28 meq/L (ref 19–32)
Chloride: 107 mEq/L (ref 96–112)
Creatinine, Ser: 1.02 mg/dL (ref 0.40–1.50)
GFR: 81.47 mL/min (ref >60.00)
Glucose, Bld: 104 mg/dL — ABNORMAL HIGH (ref 70–99)
Potassium: 4.2 mEq/L (ref 3.5–5.1)
SODIUM: 139 meq/L (ref 135–145)

## 2017-06-13 LAB — HEMOGLOBIN A1C: Hgb A1c MFr Bld: 5.5 % (ref 4.6–6.5)

## 2017-06-13 NOTE — Patient Instructions (Signed)

## 2017-06-13 NOTE — Progress Notes (Signed)
Subjective:  Patient ID: Taylor Schroeder, male    DOB: August 10, 1966  Age: 51 y.o. MRN: [id number]  CC: Hypertension   HPI Taylor Schroeder presents for a BP check - he tells me that his BP at home has been well controlled for the last 2 weeks. He complains of worsening left shoulder pain with decreased ROM and wants to see his ortho doctor again.  Outpatient Medications Prior to Visit  Medication Sig Dispense Refill  . ibuprofen (ADVIL,MOTRIN) 200 MG tablet Take 800 mg by mouth daily.    Marland Kitchen oxyCODONE (OXY IR/ROXICODONE) 5 MG immediate release tablet Take 1-2 tablets (5-10 mg total) by mouth every 6 (six) hours as needed for moderate pain, severe pain or breakthrough pain. 30 tablet 0  . pantoprazole (PROTONIX) 40 MG tablet Take 1 tablet (40 mg total) by mouth daily before breakfast. 90 tablet 3  . rosuvastatin (CRESTOR) 10 MG tablet Take 1 tablet (10 mg total) by mouth daily. (Patient taking differently: Take 10 mg by mouth at bedtime. ) 90 tablet 3  . telmisartan (MICARDIS) 80 MG tablet Take 1 tablet (80 mg total) by mouth daily. 90 tablet 1   No facility-administered medications prior to visit.     ROS Review of Systems  Constitutional: Negative for chills, diaphoresis, fatigue and unexpected weight change.  HENT: Negative.  Negative for trouble swallowing.   Eyes: Negative.  Negative for visual disturbance.  Respiratory: Negative.  Negative for cough, chest tightness, shortness of breath and wheezing.   Cardiovascular: Negative for chest pain, palpitations and leg swelling.  Gastrointestinal: Negative for abdominal pain, constipation, diarrhea, nausea and vomiting.  Endocrine: Negative.   Genitourinary: Negative.  Negative for difficulty urinating, hematuria and urgency.  Musculoskeletal: Positive for arthralgias. Negative for back pain, myalgias and neck pain.  Skin: Negative.   Allergic/Immunologic: Negative.   Neurological: Negative for dizziness, weakness, light-headedness and  headaches.  Hematological: Negative for adenopathy. Does not bruise/bleed easily.  Psychiatric/Behavioral: Negative.     Objective:  BP 130/80 (BP Location: Right Arm, Patient Position: Sitting, Cuff Size: Large) Comment: BP (L) 134/78 (R) 136/84  Pulse 90   Temp 98.2 F (36.8 C) (Oral)   Resp 16   Ht 5\' 9"  (1.753 m)   Wt 265 lb (120.2 kg)   SpO2 98%   BMI 39.13 kg/m   BP Readings from Last 3 Encounters:  06/13/17 130/80  05/28/17 138/78  04/18/17 (!) 159/100    Wt Readings from Last 3 Encounters:  06/13/17 265 lb (120.2 kg)  05/28/17 262 lb (118.8 kg)  04/18/17 258 lb (117 kg)    Physical Exam  Constitutional: He is oriented to person, place, and time. No distress.  HENT:  Mouth/Throat: Oropharynx is clear and moist. No oropharyngeal exudate.  Eyes: Conjunctivae are normal. Right eye exhibits no discharge. Left eye exhibits no discharge. No scleral icterus.  Neck: Normal range of motion. Neck supple. No JVD present. No thyromegaly present.  Cardiovascular: Normal rate, regular rhythm and intact distal pulses.  Exam reveals no gallop and no friction rub.   No murmur heard. Pulmonary/Chest: Effort normal. No respiratory distress. He has no wheezes. He has no rales. He exhibits no tenderness.  Abdominal: Soft. Bowel sounds are normal. He exhibits no distension and no mass. There is no tenderness. There is no rebound and no guarding.  Musculoskeletal: He exhibits no edema, tenderness or deformity.       Left shoulder: He exhibits decreased range of motion and pain. He  exhibits no tenderness, no swelling, no effusion and no deformity.  Lymphadenopathy:    He has no cervical adenopathy.  Neurological: He is alert and oriented to person, place, and time.  Skin: Skin is warm and dry. He is not diaphoretic.  Vitals reviewed.   Lab Results  Component Value Date   WBC 7.4 01/28/2017   HGB 13.8 01/28/2017   HCT 40.8 01/28/2017   PLT 222 01/28/2017   GLUCOSE 104 (H)  06/13/2017   CHOL 227 (H) 12/21/2016   TRIG 270.0 (H) 12/21/2016   HDL 41.80 12/21/2016   LDLDIRECT 144.0 12/21/2016   LDLCALC 143 (H) 07/15/2014   ALT 41 12/21/2016   AST 23 12/21/2016   NA 139 06/13/2017   K 4.2 06/13/2017   CL 107 06/13/2017   CREATININE 1.02 06/13/2017   BUN 18 06/13/2017   CO2 28 06/13/2017   TSH 2.34 07/15/2014   PSA 2.49 12/21/2016   INR 0.94 01/30/2014   HGBA1C 5.5 06/13/2017    No results found.  Assessment & Plan:   Kenichi was seen today for hypertension.  Diagnoses and all orders for this visit:  Hyperglycemia- his A1C is 5.5%, he has very mild prediabetes and agrees to work on his lifestyle modifications -     Basic metabolic panel; Future -     Hemoglobin A1c; Future  Essential hypertension- his BP is well controlled, lytes and renal fxn are normal -     Basic metabolic panel; Future  Primary osteoarthritis of left shoulder -     Ambulatory referral to Orthopedic Surgery  Need for influenza vaccination -     Flu Vaccine QUAD 36+ mos IM   I am having Mr. Buhrman maintain his pantoprazole, rosuvastatin, telmisartan, ibuprofen, and oxyCODONE.  No orders of the defined types were placed in this encounter.    Follow-up: Return in about 6 months (around 12/11/2017).  Scarlette Calico, MD

## 2017-06-14 ENCOUNTER — Encounter: Payer: Self-pay | Admitting: Internal Medicine

## 2017-06-23 ENCOUNTER — Other Ambulatory Visit: Payer: Self-pay | Admitting: Internal Medicine

## 2017-06-23 DIAGNOSIS — I10 Essential (primary) hypertension: Secondary | ICD-10-CM

## 2017-07-12 ENCOUNTER — Ambulatory Visit: Payer: Self-pay | Admitting: Neurology

## 2017-07-19 ENCOUNTER — Other Ambulatory Visit: Payer: Self-pay | Admitting: Internal Medicine

## 2017-08-02 ENCOUNTER — Ambulatory Visit (INDEPENDENT_AMBULATORY_CARE_PROVIDER_SITE_OTHER): Payer: BC Managed Care – PPO

## 2017-08-02 ENCOUNTER — Ambulatory Visit (INDEPENDENT_AMBULATORY_CARE_PROVIDER_SITE_OTHER): Payer: Self-pay

## 2017-08-02 ENCOUNTER — Ambulatory Visit (INDEPENDENT_AMBULATORY_CARE_PROVIDER_SITE_OTHER): Payer: BC Managed Care – PPO | Admitting: Orthopaedic Surgery

## 2017-08-02 ENCOUNTER — Encounter (INDEPENDENT_AMBULATORY_CARE_PROVIDER_SITE_OTHER): Payer: Self-pay | Admitting: Orthopaedic Surgery

## 2017-08-02 VITALS — BP 144/97 | HR 97 | Ht 68.5 in | Wt 255.0 lb

## 2017-08-02 DIAGNOSIS — M25561 Pain in right knee: Secondary | ICD-10-CM | POA: Diagnosis not present

## 2017-08-02 DIAGNOSIS — G8929 Other chronic pain: Secondary | ICD-10-CM | POA: Diagnosis not present

## 2017-08-02 DIAGNOSIS — M25512 Pain in left shoulder: Secondary | ICD-10-CM | POA: Diagnosis not present

## 2017-08-02 DIAGNOSIS — M25511 Pain in right shoulder: Secondary | ICD-10-CM | POA: Diagnosis not present

## 2017-08-02 DIAGNOSIS — Z96652 Presence of left artificial knee joint: Secondary | ICD-10-CM | POA: Diagnosis not present

## 2017-08-02 DIAGNOSIS — M19012 Primary osteoarthritis, left shoulder: Secondary | ICD-10-CM

## 2017-08-08 ENCOUNTER — Encounter (INDEPENDENT_AMBULATORY_CARE_PROVIDER_SITE_OTHER): Payer: Self-pay | Admitting: Orthopaedic Surgery

## 2017-08-08 NOTE — Progress Notes (Signed)
Office Visit Note   Patient: Taylor Schroeder           Date of Birth: [birth year]           MRN: [id number] Visit Date: 08/02/2017              Requested by: Janith Lima, MD [address] [place], [place] [address] PCP: Janith Lima, MD   Assessment & Plan: Visit Diagnoses:  1. Chronic pain of right knee   2. Chronic pain of both shoulders   3. History of total knee arthroplasty, left   4. Primary osteoarthritis of left shoulder     Plan: Patient's biggest problem is his right knee osteoarthritis with progressive varus and round on bone changes.  He will look at his schedule and wants to wait couple months before he calls for scheduling total knee arthroplasty.  We reviewed the shoulder x-rays that showed significant erosive changes in his left shoulder.  If his symptoms progress then CT scan of the shoulder will be done for preoperative planning.   Follow-Up Instructions: No Follow-up on file.   Orders:  Orders Placed This Encounter  Procedures  . XR Knee 1-2 Views Right  . XR Shoulder Left  . XR Shoulder Right   No orders of the defined types were placed in this encounter.     Procedures: No procedures performed   Clinical Data: No additional findings.   Subjective: Chief Complaint  Patient presents with  . Right Shoulder - Pain  . Left Shoulder - Pain  . Right Knee - Pain    HPI 50 year old male returns with multiple problems.  He had a right knee injection January which helped for a while and now his symptoms have increased.  He has primarily medial changes prominent limp and varus deformity of his knee.  Left total knee arthroplasty is functioning well.  He said some pain in his left shoulder sometimes his left arm and hand seems to go numb.  Left shoulder occasionally gets stuck he has difficulty moving it and once he breaks it loose it moves better.  Patient has several level disc degeneration with mild disc protrusions.  Review of  Systems he was systems updated and is unchanged from 09/28/2016 office visit with multiple joint complaints.  Left total knee arthroplasty 2015 by Dr. Lorin Mercy.   Objective: Vital Signs: BP (!) 144/97   Pulse 97   Ht 5' 8.5" (1.74 m)   Wt 255 lb (115.7 kg)   BMI 38.21 kg/m   Physical Exam  Constitutional: He is oriented to person, place, and time. He appears well-developed and well-nourished.  HENT:  Head: Normocephalic and atraumatic.  Eyes: EOM are normal. Pupils are equal, round, and reactive to light.  Neck: No tracheal deviation present. No thyromegaly present.  Cardiovascular: Normal rate.  Pulmonary/Chest: Effort normal. He has no wheezes.  Abdominal: Soft. Bowel sounds are normal.  Neurological: He is alert and oriented to person, place, and time.  Skin: Skin is warm and dry. Capillary refill takes less than 2 seconds.  Psychiatric: He has a normal mood and affect. His behavior is normal. Judgment and thought content normal.    Ortho Exam is amatory with a right knee limp medial joint line tenderness.  Hip range of motion pulses are normal.  Well-healed left total knee arthroplasty.  Right shoulder has limitation range of motion with flexion of 50% with pain crepitus with range of motion limitation of internal rotation  only to posterior axillary line with pain.  Has difficulty getting his left hand to the top of his head.  Some brachial plexus tenderness present both right and left.  Biceps triceps brachial radialis reflex are 2+ and symmetrical.  Patient has direct negative drop arm test right and left.  Specialty Comments:  No specialty comments available.  Imaging: No results found.   PMFS History: Patient Active Problem List   Diagnosis Date Noted  . 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 Sleep apnea 03/08/2017  . Hyperlipidemia with target LDL less than 100 12/22/2016  . Primary erectile dysfunction 11/18/2014  . Essential hypertension 07/15/2014  .  DDD (degenerative disc disease), cervical 10/03/2013  . Esophageal ring, acquired 07/12/2013  . Crohn's ileitis - working dx 06/01/2013  . BPH (benign prostatic hyperplasia) 04/18/2013  . Pernicious anemia-B12 deficiency 04/18/2013  . Severe obesity (BMI >= 40) ([place]) 12/21/2012  . GERD (gastroesophageal reflux disease) 12/21/2012  . Hyperglycemia 08/29/2012  . Routine general medical examination at a health care facility 08/29/2012   Past Medical History:  Diagnosis Date  . Arthritis    "qwhere" (02/03/2017)  . BPH (benign prostatic hyperplasia)   . Childhood asthma    "as a baby"  . Coronary artery disease Non-obstructive   a. nonobs cath in 2006;  b. 01/2013 Cath: LM nl, LAD 57m, LCX nl, RCA non-dom, nl. EF 60-65%.  . Crohn's ileitis - working dx 06/01/2013   "a mild case of it"  . Esophageal ring, acquired 07/12/2013  . GERD (gastroesophageal reflux disease)   . H/O hiatal hernia    "think so" (02/06/2014)  . Headache    "resolved w/BP control in 11/2015" (02/03/2017)  . High cholesterol   . Hypertension   . Obesity   . Pernicious anemia-B12 deficiency 04/18/2013  . PONV (postoperative nausea and vomiting) 02/03/2017; 08/2016    Family History  Problem Relation Age of Onset  . Hypertension Mother   . Cancer Father        type unknown  . COPD Other   . Emphysema Maternal Grandmother   . Emphysema Maternal Grandfather   . Other Paternal Grandmother        spinal cancer  . Alcohol abuse Neg Hx   . Diabetes Neg Hx   . Early death Neg Hx   . Heart disease Neg Hx   . Hyperlipidemia Neg Hx   . Kidney disease Neg Hx   . Stroke Neg Hx   . Colon cancer Neg Hx   . Stomach cancer Neg Hx   . Esophageal cancer Neg Hx   . Rectal cancer Neg Hx   . Liver cancer Neg Hx     Past Surgical History:  Procedure Laterality Date  . APPENDECTOMY    . CARDIAC CATHETERIZATION  01/2013   "results were clear"  . CARPAL TUNNEL RELEASE Right   . COLONOSCOPY  ~ 2014  .  ESOPHAGOGASTRODUODENOSCOPY  ~ 2014  . INGUINAL HERNIA REPAIR  1966   "? side"  . KNEE ARTHROSCOPY Left 08/2016   scar tissue cleaned out  . LAPAROSCOPIC CHOLECYSTECTOMY  02/03/2017  . MULTIPLE TOOTH EXTRACTIONS  09/2016  . SHOULDER ARTHROSCOPY Left 03/12/2013  . TOTAL KNEE ARTHROPLASTY Left 02/06/2014   Social History   Occupational History  . Occupation: MAINTENANCE    Employer: [employer]  Tobacco Use  . Smoking status: Former Smoker    Packs/day: 2.00    Years: 30.00    Pack years: 60.00  Types: Cigarettes    Last attempt to quit: 08/29/2008    Years since quitting: 8.9  . Smokeless tobacco: Never Used  Substance and Sexual Activity  . Alcohol use: No    Comment: 02/03/2017 "maybe 12 pack of beer all at one time but only once/year"; 02/06/2014 "couple beers a couple times/month"  . Drug use: No  . Sexual activity: Yes

## 2017-09-01 ENCOUNTER — Other Ambulatory Visit (INDEPENDENT_AMBULATORY_CARE_PROVIDER_SITE_OTHER): Payer: Self-pay

## 2017-09-06 NOTE — Pre-Procedure Instructions (Signed)
Taylor Schroeder  09/06/2017      [id number] - [place], [place] - [address] [place] [address] Phone: [phone] Fax: [phone]    Your procedure is scheduled on December 19  Report to [facility] at 1030 A.M.  Call this number if you have problems the morning of surgery:  [phone]   Remember:  Do not eat food or drink liquids after midnight.  Continue all medications as directed by your physician except follow these medication instructions before surgery below   Take these medicines the morning of surgery with A SIP OF WATER  pantoprazole (PROTONIX)  7 days prior to surgery STOP taking any Aspirin(unless otherwise instructed by your surgeon), Aleve, Naproxen, Ibuprofen, Motrin, Advil, Goody's, BC's, all herbal medications, fish oil, and all vitamins     Do not wear jewelry  Do not wear lotions, powders, or cologne, or deodorant.  Men may shave face and neck.  Do not bring valuables to the hospital.  [facility] is not responsible for any belongings or valuables.  Contacts, dentures or bridgework may not be worn into surgery.  Leave your suitcase in the car.  After surgery it may be brought to your room.  For patients admitted to the hospital, discharge time will be determined by your treatment team.  Patients discharged the day of surgery will not be allowed to drive home.    Special instructions:   [facility]- Preparing For Surgery  Before surgery, you can play an important role. Because skin is not sterile, your skin needs to be as free of germs as possible. You can reduce the number of germs on your skin by washing with CHG (chlorahexidine gluconate) Soap before surgery.  CHG is an antiseptic cleaner which kills germs and bonds with the skin to continue killing germs even after washing.  Please do not use if you have an allergy to CHG or antibacterial soaps.  If your skin becomes reddened/irritated stop using the CHG.  Do not shave (including legs and underarms) for at least 48 hours prior to first CHG shower. It is OK to shave your face.  Please follow these instructions carefully.   1. Shower the NIGHT BEFORE SURGERY and the MORNING OF SURGERY with CHG.   2. If you chose to wash your hair, wash your hair first as usual with your normal shampoo.  3. After you shampoo, rinse your hair and body thoroughly to remove the shampoo.  4. Use CHG as you would any other liquid soap. You can apply CHG directly to the skin and wash gently with a scrungie or a clean washcloth.   5. Apply the CHG Soap to your body ONLY FROM THE NECK DOWN.  Do not use on open wounds or open sores. Avoid contact with your eyes, ears, mouth and genitals (private parts). Wash Face and genitals (private parts)  with your normal soap.  6. Wash thoroughly, paying special attention to the area where your surgery will be performed.  7. Thoroughly rinse your body with warm water from the neck down.  8. DO NOT shower/wash with your normal soap after using and rinsing off the CHG Soap.  9. Pat yourself dry with a CLEAN TOWEL.  10. Wear CLEAN PAJAMAS to bed the night before surgery, wear comfortable clothes the morning of surgery  11. Place CLEAN SHEETS on your bed the night of your first shower and DO NOT  SLEEP WITH PETS.    Day of Surgery: Do not apply any deodorants/lotions. Please wear clean clothes to the hospital/surgery center.      Please read over the following fact sheets that you were given.

## 2017-09-07 ENCOUNTER — Other Ambulatory Visit: Payer: Self-pay

## 2017-09-07 ENCOUNTER — Encounter (HOSPITAL_COMMUNITY): Payer: Self-pay

## 2017-09-07 ENCOUNTER — Encounter (HOSPITAL_COMMUNITY)
Admission: RE | Admit: 2017-09-07 | Discharge: 2017-09-07 | Disposition: A | Discharge status: Home / Self Care | Payer: BC Managed Care – PPO | Source: Ambulatory Visit | Attending: Orthopaedic Surgery | Admitting: Orthopaedic Surgery

## 2017-09-07 DIAGNOSIS — D649 Anemia, unspecified: Secondary | ICD-10-CM | POA: Diagnosis not present

## 2017-09-07 DIAGNOSIS — K222 Esophageal obstruction: Secondary | ICD-10-CM | POA: Insufficient documentation

## 2017-09-07 DIAGNOSIS — N400 Enlarged prostate without lower urinary tract symptoms: Secondary | ICD-10-CM | POA: Diagnosis not present

## 2017-09-07 DIAGNOSIS — K219 Gastro-esophageal reflux disease without esophagitis: Secondary | ICD-10-CM | POA: Insufficient documentation

## 2017-09-07 DIAGNOSIS — Z01812 Encounter for preprocedural laboratory examination: Secondary | ICD-10-CM | POA: Insufficient documentation

## 2017-09-07 DIAGNOSIS — R739 Hyperglycemia, unspecified: Secondary | ICD-10-CM | POA: Insufficient documentation

## 2017-09-07 DIAGNOSIS — K5000 Crohn's disease of small intestine without complications: Secondary | ICD-10-CM | POA: Insufficient documentation

## 2017-09-07 DIAGNOSIS — M5030 Other cervical disc degeneration, unspecified cervical region: Secondary | ICD-10-CM | POA: Diagnosis not present

## 2017-09-07 HISTORY — DX: Sleep apnea, unspecified: G47.30

## 2017-09-07 LAB — CBC
HCT: 40.5 % (ref 39.0–52.0)
HEMOGLOBIN: 14.1 g/dL (ref 13.0–17.0)
MCH: 30.9 pg (ref 26.0–34.0)
MCHC: 34.8 g/dL (ref 30.0–36.0)
MCV: 88.8 fL (ref 78.0–100.0)
PLATELETS: 256 10*3/uL (ref 150–400)
RBC: 4.56 MIL/uL (ref 4.22–5.81)
RDW: 13 % (ref 11.5–15.5)
WBC: 7.3 10*3/uL (ref 4.0–10.5)

## 2017-09-07 LAB — SURGICAL PCR SCREEN
MRSA, PCR: NEGATIVE
STAPHYLOCOCCUS AUREUS: NEGATIVE

## 2017-09-07 LAB — COMPREHENSIVE METABOLIC PANEL
ALBUMIN: 3.6 g/dL (ref 3.5–5.0)
ALT: 36 U/L (ref 17–63)
ANION GAP: 9 (ref 5–15)
AST: 29 U/L (ref 15–41)
Alkaline Phosphatase: 70 U/L (ref 38–126)
BUN: 19 mg/dL (ref 6–20)
CALCIUM: 8.9 mg/dL (ref 8.9–10.3)
CHLORIDE: 107 mmol/L (ref 101–111)
CO2: 22 mmol/L (ref 22–32)
Creatinine, Ser: 1.05 mg/dL (ref 0.61–1.24)
GFR calc non Af Amer: >60 mL/min (ref >60)
GLUCOSE: 114 mg/dL — AB (ref 65–99)
POTASSIUM: 4 mmol/L (ref 3.5–5.1)
SODIUM: 138 mmol/L (ref 135–145)
Total Bilirubin: 0.5 mg/dL (ref 0.3–1.2)
Total Protein: 6.2 g/dL — ABNORMAL LOW (ref 6.5–8.1)

## 2017-09-07 LAB — URINALYSIS, ROUTINE W REFLEX MICROSCOPIC
Bilirubin Urine: NEGATIVE
Glucose, UA: NEGATIVE mg/dL
HGB URINE DIPSTICK: NEGATIVE
Ketones, ur: NEGATIVE mg/dL
LEUKOCYTES UA: NEGATIVE
NITRITE: NEGATIVE
PROTEIN: NEGATIVE mg/dL
SPECIFIC GRAVITY, URINE: 1.021 (ref 1.005–1.030)
pH: 7 (ref 5.0–8.0)

## 2017-09-07 LAB — PROTIME-INR
INR: 0.95
Prothrombin Time: 12.6 seconds (ref 11.4–15.2)

## 2017-09-07 LAB — APTT: APTT: 33 s (ref 24–36)

## 2017-09-07 NOTE — Progress Notes (Signed)
PCP - Scarlette Calico Cardiologist - denies  Chest x-ray - not needed EKG - 01/28/17 Stress Test - denies ECHO - denies Cardiac Cath - denies  Sleep Study - 2018 CPAP - wears at night - instructed to bring mask and tubing with the day of surgery   Anesthesia review: yes  Patient denies shortness of breath, fever, cough and chest pain at PAT appointment   Patient verbalized understanding of instructions that were given to them at the PAT appointment. Patient was also instructed that they will need to review over the PAT instructions again at home before surgery.

## 2017-09-08 ENCOUNTER — Encounter (HOSPITAL_COMMUNITY): Payer: Self-pay

## 2017-09-08 ENCOUNTER — Telehealth (INDEPENDENT_AMBULATORY_CARE_PROVIDER_SITE_OTHER): Payer: Self-pay | Admitting: Orthopaedic Surgery

## 2017-09-08 NOTE — Telephone Encounter (Signed)
[facility] for note thanks

## 2017-09-08 NOTE — Progress Notes (Signed)
Anesthesia Chart Review: Patient is a 51 year old male scheduled for right TKR on 09/14/17 by Dr. Rodell Perna.  History includes former smoker (quit '09), HTN, post-operative N/V, GERD, pernicious anemia, Crohn's ileitis, BPH, lower esophageal ring s/p dilation, arthritis, left TKA 02/06/14, appendectomy, minimal CAD '14, cholecystectomy 02/03/17, severe sleep apnea (recent diagnosis 04/2017; wears CPAP). BMI is 39.99 consistent with obesity/borderline morbid obesity.  - PCP is Dr. Scarlette Calico. Last visit 06/13/17. - GI is Dr. Silvano Rusk.  - He was seen by cardiologist Dr. Mertie Moores for chest pain and had a cardiac cath on 01/29/13 that showed essentially normal coronaries with only minor luminal irregularities (20% mid LAD) and normal LVF with EF 60-65%. He was discharged back to his PCP for follow-up. - Neurologist is Dr. Star Age (for OSA).  Meds include Protonix, Crestor, telmisartan.  BP (!) 150/85   Pulse 89   Temp 36.7 C   Resp 20   Ht 5\' 9"  (1.753 m)   Wt 270 lb 12.8 oz (122.8 kg)   SpO2 95%   BMI 39.99 kg/m   EKG 01/28/17: NSR.  Cardiac cath 01/29/13: Conclusions: Essentially normal coronaries. He had several minor luminal irregularities (20% mid LAD).  Normal LV function. EF 60-65%.  Preoperative labs noted. Last A1c was 5.5 on 06/13/17.   If no acute changes then I would anticipate that he can proceed as planned.  [facility]/Anesthesiology Phone [phone] 09/08/2017 4:10 PM

## 2017-09-08 NOTE — Telephone Encounter (Signed)
Patient states he is having knee surgery next Wednesday and he had to stop taking ibuprofen and otc pain medicine a week before. He said he needs a note to be out of work until the surgery because he is in a lot of pain/could barely walk at work yesterday and today. Can you write this and let patient know when ready? His phone # [phone]

## 2017-09-08 NOTE — Telephone Encounter (Signed)
Ok for note 

## 2017-09-08 NOTE — Telephone Encounter (Signed)
Note completed. Patient came in to office to pick up.

## 2017-09-13 MED ORDER — CEFAZOLIN SODIUM-DEXTROSE 2-4 GM/100ML-% IV SOLN: 2.0000 g | INTRAVENOUS | Status: DC

## 2017-09-13 MED ORDER — DEXTROSE 5 % IV SOLN
3.0000 g | INTRAVENOUS | Status: AC
  Administered 2017-09-14: 3 g via INTRAVENOUS
  Filled 2017-09-13: qty 3

## 2017-09-13 NOTE — H&P (Signed)
TOTAL KNEE ADMISSION H&P  Patient is being admitted for right total knee arthroplasty.  Subjective:  Chief Complaint:right knee pain.  HPI: Taylor Schroeder, 51 y.o. male, has a history of pain and functional disability in the right knee due to arthritis and has failed non-surgical conservative treatments for greater than 12 weeks to includeNSAID's and/or analgesics, corticosteriod injections, use of assistive devices and activity modification.  Onset of symptoms was gradual, starting 10 years ago with gradually worsening course since that time.  Patient currently rates pain in the right knee(s) at 10 out of 10 with activity. Patient has night pain, worsening of pain with activity and weight bearing, pain that interferes with activities of daily living, pain with passive range of motion, crepitus and joint swelling.  Patient has evidence of subchondral sclerosis, periarticular osteophytes and joint space narrowing by imaging studies.  There is no active infection.  Patient Active Problem List   Diagnosis Date Noted  . 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 Sleep apnea 03/08/2017  . Hyperlipidemia with target LDL less than 100 12/22/2016  . Primary erectile dysfunction 11/18/2014  . Essential hypertension 07/15/2014  . DDD (degenerative disc disease), cervical 10/03/2013  . Esophageal ring, acquired 07/12/2013  . Crohn's ileitis - working dx 06/01/2013  . BPH (benign prostatic hyperplasia) 04/18/2013  . Pernicious anemia-B12 deficiency 04/18/2013  . Severe obesity (BMI >= 40) ([place]) 12/21/2012  . GERD (gastroesophageal reflux disease) 12/21/2012  . Hyperglycemia 08/29/2012  . Routine general medical examination at a health care facility 08/29/2012   Past Medical History:  Diagnosis Date  . Arthritis    "qwhere" (02/03/2017)  . BPH (benign prostatic hyperplasia)   . Childhood asthma    "as a baby"  . Coronary artery disease Non-obstructive   a. nonobs cath in 2006;  b.  01/2013 Cath: LM nl, LAD 1m, LCX nl, RCA non-dom, nl. EF 60-65%.  . Crohn's ileitis - working dx 06/01/2013   "a mild case of it"  . Esophageal ring, acquired 07/12/2013  . GERD (gastroesophageal reflux disease)   . H/O hiatal hernia    "think so" (02/06/2014)  . Headache    "resolved w/BP control in 11/2015" (02/03/2017)  . High cholesterol   . Hypertension   . Obesity   . Pernicious anemia-B12 deficiency 04/18/2013  . PONV (postoperative nausea and vomiting) 02/03/2017; 08/2016  . Sleep apnea     Past Surgical History:  Procedure Laterality Date  . APPENDECTOMY    . CARDIAC CATHETERIZATION  01/2013   "results were clear"  . CARPAL TUNNEL RELEASE Right   . CHOLECYSTECTOMY N/A 02/03/2017   Procedure: LAPAROSCOPIC CHOLECYSTECTOMY;  Surgeon: Greer Pickerel, MD;  Location: [facility];  Service: General;  Laterality: N/A;  . COLONOSCOPY  ~ 2014  . ESOPHAGOGASTRODUODENOSCOPY  ~ 2014  . INGUINAL HERNIA REPAIR  1966   "? side"  . KNEE ARTHROPLASTY Left 02/06/2014   Procedure: COMPUTER ASSISTED TOTAL KNEE ARTHROPLASTY;  Surgeon: [name], MD;  Location: [facility];  Service: Orthopedics;  Laterality: Left;  Cemented Left Total Knee Arthroplasty  . KNEE ARTHROSCOPY Left 08/2016   scar tissue cleaned out  . LAPAROSCOPIC CHOLECYSTECTOMY  02/03/2017  . LEFT HEART CATHETERIZATION WITH CORONARY ANGIOGRAM N/A 01/29/2013   Procedure: LEFT HEART CATHETERIZATION WITH CORONARY ANGIOGRAM;  Surgeon: Thayer Headings, MD;  Location: [facility] CATH LAB;  Service: Cardiovascular;  Laterality: N/A;  . MULTIPLE TOOTH EXTRACTIONS  09/2016  . SHOULDER ARTHROSCOPY Left 03/12/2013  . TOTAL KNEE ARTHROPLASTY Left  02/06/2014    Current Facility-Administered Medications  Medication Dose Route Frequency Provider Last Rate Last Dose  . [START ON 09/14/2017] ceFAZolin (ANCEF) 3 g in dextrose 5 % 50 mL IVPB  3 g Intravenous To SS-Surg [name], MD       Current Outpatient Medications  Medication Sig Dispense Refill Last Dose  .  ibuprofen (ADVIL,MOTRIN) 200 MG tablet Take 1,000 mg by mouth daily.    Taking  . pantoprazole (PROTONIX) 40 MG tablet TAKE 1 TABLET (40 MG TOTAL) BY MOUTH DAILY BEFORE BREAKFAST. 90 tablet 3 Taking  . telmisartan (MICARDIS) 80 MG tablet TAKE 1 TABLET BY MOUTH EVERY DAY 90 tablet 1 Taking  . oxyCODONE (OXY IR/ROXICODONE) 5 MG immediate release tablet Take 1-2 tablets (5-10 mg total) by mouth every 6 (six) hours as needed for moderate pain, severe pain or breakthrough pain. (Patient not taking: Reported on 08/31/2017) 30 tablet 0 Not Taking at Unknown time  . rosuvastatin (CRESTOR) 10 MG tablet Take 1 tablet (10 mg total) by mouth daily. (Patient not taking: Reported on 08/02/2017) 90 tablet 3 Not Taking   Allergies  Allergen Reactions  . Allegra [Fexofenadine Hcl] Hives  . Hctz [Hydrochlorothiazide] Other (See Comments)    Weak and muscle cramps   . Demerol [Meperidine] Nausea And Vomiting    "Bad things" happen when I take it"    Social History   Tobacco Use  . Smoking status: Former Smoker    Packs/day: 2.00    Years: 30.00    Pack years: 60.00    Types: Cigarettes    Last attempt to quit: 08/29/2008    Years since quitting: 9.0  . Smokeless tobacco: Never Used  Substance Use Topics  . Alcohol use: No    Comment: 02/03/2017 "maybe 12 pack of beer all at one time but only once/year"; 02/06/2014 "couple beers a couple times/month"    Family History  Problem Relation Age of Onset  . Hypertension Mother   . Cancer Father        type unknown  . COPD Other   . Emphysema Maternal Grandmother   . Emphysema Maternal Grandfather   . Other Paternal Grandmother        spinal cancer  . Alcohol abuse Neg Hx   . Diabetes Neg Hx   . Early death Neg Hx   . Heart disease Neg Hx   . Hyperlipidemia Neg Hx   . Kidney disease Neg Hx   . Stroke Neg Hx   . Colon cancer Neg Hx   . Stomach cancer Neg Hx   . Esophageal cancer Neg Hx   . Rectal cancer Neg Hx   . Liver cancer Neg Hx      Review  of Systems  Constitutional: Negative.   HENT: Negative.   Eyes: Negative.   Respiratory: Negative.   Cardiovascular: Negative.   Gastrointestinal: Negative.   Genitourinary: Negative.   Musculoskeletal: Positive for joint pain.  Skin: Negative.   Neurological: Negative.   Psychiatric/Behavioral: Negative.     Objective:  Physical Exam  Constitutional: He is oriented to person, place, and time. He appears well-developed. No distress.  HENT:  Head: Normocephalic and atraumatic.  Eyes: EOM are normal. Pupils are equal, round, and reactive to light.  Neck: Normal range of motion.  Respiratory: No respiratory distress.  GI: He exhibits no distension.  Musculoskeletal:  Gait antalgic. Positive crepitus. Joint line Tender. Positive effusion. Calf nontender.  Neurovascular intact.  Neurological: He is alert and  oriented to person, place, and time.  Skin: Skin is warm and dry.  Psychiatric: He has a normal mood and affect.    Vital signs in last 24 hours:    Labs:   Estimated body mass index is 39.99 kg/m as calculated from the following:   Height as of 09/07/17: 5\' 9"  (1.753 m).   Weight as of 09/07/17: 270 lb 12.8 oz (122.8 kg).   Imaging Review Plain radiographs demonstrate moderate degenerative joint disease of the right knee(s). The overall alignment ismild valgus. The bone quality appears to be good for age and reported activity level.  Assessment/Plan:  End stage arthritis, right knee   The patient history, physical examination, clinical judgment of the provider and imaging studies are consistent with end stage degenerative joint disease of the right knee(s) and total knee arthroplasty is deemed medically necessary. The treatment options including medical management, injection therapy arthroscopy and arthroplasty were discussed at length. The risks and benefits of total knee arthroplasty were presented and reviewed. The risks due to aseptic loosening, infection,  stiffness, patella tracking problems, thromboembolic complications and other imponderables were discussed. The patient acknowledged the explanation, agreed to proceed with the plan and consent was signed. Patient is being admitted for inpatient treatment for surgery, pain control, PT, OT, prophylactic antibiotics, VTE prophylaxis, progressive ambulation and ADL's and discharge planning. The patient is planning to be discharged home with home health services

## 2017-09-14 ENCOUNTER — Encounter (HOSPITAL_COMMUNITY): Payer: Self-pay | Admitting: General Practice

## 2017-09-14 ENCOUNTER — Encounter (HOSPITAL_COMMUNITY)
Admission: RE | Disposition: A | Discharge status: Home Health | Payer: Self-pay | Source: Ambulatory Visit | Attending: Orthopaedic Surgery

## 2017-09-14 ENCOUNTER — Other Ambulatory Visit: Payer: Self-pay

## 2017-09-14 ENCOUNTER — Inpatient Hospital Stay (HOSPITAL_COMMUNITY): Payer: BC Managed Care – PPO | Admitting: Emergency Medicine

## 2017-09-14 ENCOUNTER — Inpatient Hospital Stay (HOSPITAL_COMMUNITY)
Admission: RE | Admit: 2017-09-14 | Discharge: 2017-09-16 | DRG: 470 | Disposition: A | Discharge status: Home Health | Payer: BC Managed Care – PPO | Source: Ambulatory Visit | Attending: Orthopaedic Surgery | Admitting: Orthopaedic Surgery

## 2017-09-14 ENCOUNTER — Inpatient Hospital Stay (HOSPITAL_COMMUNITY): Payer: BC Managed Care – PPO | Admitting: Anesthesiology

## 2017-09-14 DIAGNOSIS — Z96652 Presence of left artificial knee joint: Secondary | ICD-10-CM | POA: Diagnosis present

## 2017-09-14 DIAGNOSIS — G4730 Sleep apnea, unspecified: Secondary | ICD-10-CM | POA: Diagnosis present

## 2017-09-14 DIAGNOSIS — M5030 Other cervical disc degeneration, unspecified cervical region: Secondary | ICD-10-CM | POA: Diagnosis present

## 2017-09-14 DIAGNOSIS — N400 Enlarged prostate without lower urinary tract symptoms: Secondary | ICD-10-CM | POA: Diagnosis present

## 2017-09-14 DIAGNOSIS — I10 Essential (primary) hypertension: Secondary | ICD-10-CM | POA: Diagnosis present

## 2017-09-14 DIAGNOSIS — M19012 Primary osteoarthritis, left shoulder: Secondary | ICD-10-CM | POA: Diagnosis present

## 2017-09-14 DIAGNOSIS — E785 Hyperlipidemia, unspecified: Secondary | ICD-10-CM | POA: Diagnosis present

## 2017-09-14 DIAGNOSIS — Z87891 Personal history of nicotine dependence: Secondary | ICD-10-CM | POA: Diagnosis not present

## 2017-09-14 DIAGNOSIS — I2510 Atherosclerotic heart disease of native coronary artery without angina pectoris: Secondary | ICD-10-CM | POA: Diagnosis present

## 2017-09-14 DIAGNOSIS — Z888 Allergy status to other drugs, medicaments and biological substances status: Secondary | ICD-10-CM

## 2017-09-14 DIAGNOSIS — M1712 Unilateral primary osteoarthritis, left knee: Secondary | ICD-10-CM

## 2017-09-14 DIAGNOSIS — K219 Gastro-esophageal reflux disease without esophagitis: Secondary | ICD-10-CM | POA: Diagnosis present

## 2017-09-14 DIAGNOSIS — Z79899 Other long term (current) drug therapy: Secondary | ICD-10-CM | POA: Diagnosis not present

## 2017-09-14 DIAGNOSIS — Z885 Allergy status to narcotic agent status: Secondary | ICD-10-CM | POA: Diagnosis not present

## 2017-09-14 DIAGNOSIS — M1711 Unilateral primary osteoarthritis, right knee: Secondary | ICD-10-CM | POA: Diagnosis present

## 2017-09-14 HISTORY — PX: TOTAL KNEE ARTHROPLASTY: SHX125

## 2017-09-14 SURGERY — ARTHROPLASTY, KNEE, TOTAL
Anesthesia: Spinal | Site: Knee | Laterality: Right

## 2017-09-14 MED ORDER — MIDAZOLAM HCL 5 MG/5ML IJ SOLN
INTRAMUSCULAR | Status: DC | PRN
  Administered 2017-09-14 (×3): 1 mg via INTRAVENOUS

## 2017-09-14 MED ORDER — SODIUM CHLORIDE 0.9 % IV SOLN: INTRAVENOUS | Status: DC

## 2017-09-14 MED ORDER — METOCLOPRAMIDE HCL 5 MG PO TABS: 5.0000 mg | ORAL_TABLET | Freq: Three times a day (TID) | ORAL | Status: DC | PRN

## 2017-09-14 MED ORDER — DEXTROSE 5 % IV SOLN
500.0000 mg | Freq: Four times a day (QID) | INTRAVENOUS | Status: DC | PRN
  Administered 2017-09-14: 500 mg via INTRAVENOUS
  Filled 2017-09-14: qty 5

## 2017-09-14 MED ORDER — OXYCODONE HCL 5 MG PO TABS
ORAL_TABLET | ORAL | Status: AC
  Filled 2017-09-14: qty 1

## 2017-09-14 MED ORDER — POLYETHYLENE GLYCOL 3350 17 G PO PACK: 17.0000 g | PACK | Freq: Every day | ORAL | Status: DC | PRN

## 2017-09-14 MED ORDER — SODIUM CHLORIDE 0.9 % IV SOLN
1000.0000 mg | INTRAVENOUS | Status: AC
  Administered 2017-09-14: 1000 mg via INTRAVENOUS
  Filled 2017-09-14: qty 1100

## 2017-09-14 MED ORDER — HYDROMORPHONE HCL 1 MG/ML IJ SOLN
INTRAMUSCULAR | Status: AC
  Filled 2017-09-14: qty 1

## 2017-09-14 MED ORDER — DEXAMETHASONE SODIUM PHOSPHATE 10 MG/ML IJ SOLN
INTRAMUSCULAR | Status: AC
  Filled 2017-09-14: qty 1

## 2017-09-14 MED ORDER — FENTANYL CITRATE (PF) 100 MCG/2ML IJ SOLN
INTRAMUSCULAR | Status: DC | PRN
  Administered 2017-09-14 (×5): 25 ug via INTRAVENOUS
  Administered 2017-09-14: 50 ug via INTRAVENOUS
  Administered 2017-09-14 (×3): 25 ug via INTRAVENOUS

## 2017-09-14 MED ORDER — METHOCARBAMOL 500 MG PO TABS
500.0000 mg | ORAL_TABLET | Freq: Four times a day (QID) | ORAL | Status: DC | PRN
  Administered 2017-09-14 – 2017-09-16 (×7): 500 mg via ORAL
  Filled 2017-09-14 (×7): qty 1

## 2017-09-14 MED ORDER — MENTHOL 3 MG MT LOZG
1.0000 | LOZENGE | OROMUCOSAL | Status: DC | PRN
  Filled 2017-09-14: qty 9

## 2017-09-14 MED ORDER — ACETAMINOPHEN 10 MG/ML IV SOLN
INTRAVENOUS | Status: DC | PRN
  Administered 2017-09-14: 1000 mg via INTRAVENOUS

## 2017-09-14 MED ORDER — ROPIVACAINE HCL 7.5 MG/ML IJ SOLN
INTRAMUSCULAR | Status: DC | PRN
  Administered 2017-09-14: 7 mL via PERINEURAL

## 2017-09-14 MED ORDER — DEXAMETHASONE SODIUM PHOSPHATE 10 MG/ML IJ SOLN
INTRAMUSCULAR | Status: DC | PRN
  Administered 2017-09-14: 10 mg via INTRAVENOUS

## 2017-09-14 MED ORDER — ROCURONIUM BROMIDE 10 MG/ML (PF) SYRINGE
PREFILLED_SYRINGE | INTRAVENOUS | Status: AC
  Filled 2017-09-14: qty 5

## 2017-09-14 MED ORDER — ACETAMINOPHEN 325 MG PO TABS
650.0000 mg | ORAL_TABLET | ORAL | Status: DC | PRN
  Administered 2017-09-15 – 2017-09-16 (×5): 650 mg via ORAL
  Filled 2017-09-14 (×5): qty 2

## 2017-09-14 MED ORDER — ASPIRIN EC 325 MG PO TBEC
325.0000 mg | DELAYED_RELEASE_TABLET | Freq: Every day | ORAL | Status: DC
  Administered 2017-09-15 – 2017-09-16 (×2): 325 mg via ORAL
  Filled 2017-09-14 (×2): qty 1

## 2017-09-14 MED ORDER — ONDANSETRON HCL 4 MG PO TABS: 4.0000 mg | ORAL_TABLET | Freq: Four times a day (QID) | ORAL | Status: DC | PRN

## 2017-09-14 MED ORDER — HYDROMORPHONE HCL 1 MG/ML IJ SOLN
0.5000 mg | INTRAMUSCULAR | Status: DC | PRN
  Administered 2017-09-14: 1 mg via INTRAVENOUS
  Administered 2017-09-16: 0.5 mg via INTRAVENOUS
  Filled 2017-09-14 (×2): qty 1

## 2017-09-14 MED ORDER — ONDANSETRON HCL 4 MG/2ML IJ SOLN
INTRAMUSCULAR | Status: AC
  Filled 2017-09-14: qty 2

## 2017-09-14 MED ORDER — MIDAZOLAM HCL 2 MG/2ML IJ SOLN
INTRAMUSCULAR | Status: AC
  Filled 2017-09-14: qty 2

## 2017-09-14 MED ORDER — BUPIVACAINE HCL (PF) 0.25 % IJ SOLN
INTRAMUSCULAR | Status: AC
  Filled 2017-09-14: qty 30

## 2017-09-14 MED ORDER — DOCUSATE SODIUM 100 MG PO CAPS
100.0000 mg | ORAL_CAPSULE | Freq: Two times a day (BID) | ORAL | Status: DC
  Administered 2017-09-14 – 2017-09-16 (×4): 100 mg via ORAL
  Filled 2017-09-14 (×4): qty 1

## 2017-09-14 MED ORDER — BUPIVACAINE LIPOSOME 1.3 % IJ SUSP
20.0000 mL | INTRAMUSCULAR | Status: DC
  Filled 2017-09-14: qty 20

## 2017-09-14 MED ORDER — METHOCARBAMOL 500 MG PO TABS
ORAL_TABLET | ORAL | Status: AC
  Filled 2017-09-14: qty 1

## 2017-09-14 MED ORDER — BUPIVACAINE HCL (PF) 0.25 % IJ SOLN
INTRAMUSCULAR | Status: DC | PRN
  Administered 2017-09-14: 20 mL

## 2017-09-14 MED ORDER — ONDANSETRON HCL 4 MG/2ML IJ SOLN: INTRAMUSCULAR | Status: DC | PRN

## 2017-09-14 MED ORDER — PHENOL 1.4 % MT LIQD: 1.0000 | OROMUCOSAL | Status: DC | PRN

## 2017-09-14 MED ORDER — OXYCODONE HCL 5 MG PO TABS
5.0000 mg | ORAL_TABLET | Freq: Four times a day (QID) | ORAL | Status: DC | PRN
  Administered 2017-09-14 – 2017-09-15 (×4): 5 mg via ORAL
  Administered 2017-09-15: 10 mg via ORAL
  Filled 2017-09-14 (×3): qty 1
  Filled 2017-09-14: qty 2

## 2017-09-14 MED ORDER — LIDOCAINE 2% (20 MG/ML) 5 ML SYRINGE
INTRAMUSCULAR | Status: DC | PRN
  Administered 2017-09-14: 100 mg via INTRAVENOUS

## 2017-09-14 MED ORDER — 0.9 % SODIUM CHLORIDE (POUR BTL) OPTIME
TOPICAL | Status: DC | PRN
  Administered 2017-09-14: 1000 mL

## 2017-09-14 MED ORDER — FENTANYL CITRATE (PF) 100 MCG/2ML IJ SOLN
INTRAMUSCULAR | Status: AC
  Filled 2017-09-14: qty 2

## 2017-09-14 MED ORDER — LIDOCAINE 2% (20 MG/ML) 5 ML SYRINGE
INTRAMUSCULAR | Status: AC
  Filled 2017-09-14: qty 5

## 2017-09-14 MED ORDER — SODIUM CHLORIDE 0.9 % IR SOLN
Status: DC | PRN
  Administered 2017-09-14: 3000 mL

## 2017-09-14 MED ORDER — CEFAZOLIN SODIUM-DEXTROSE 1-4 GM/50ML-% IV SOLN
1.0000 g | Freq: Three times a day (TID) | INTRAVENOUS | Status: AC
  Administered 2017-09-14 – 2017-09-15 (×2): 1 g via INTRAVENOUS
  Filled 2017-09-14 (×2): qty 50

## 2017-09-14 MED ORDER — ONDANSETRON HCL 4 MG/2ML IJ SOLN
4.0000 mg | Freq: Four times a day (QID) | INTRAMUSCULAR | Status: DC | PRN
  Filled 2017-09-14: qty 2

## 2017-09-14 MED ORDER — PHENYLEPHRINE 40 MCG/ML (10ML) SYRINGE FOR IV PUSH (FOR BLOOD PRESSURE SUPPORT)
PREFILLED_SYRINGE | INTRAVENOUS | Status: DC | PRN
  Administered 2017-09-14 (×7): 80 ug via INTRAVENOUS

## 2017-09-14 MED ORDER — IRBESARTAN 300 MG PO TABS
300.0000 mg | ORAL_TABLET | Freq: Every day | ORAL | Status: DC
  Filled 2017-09-14: qty 1

## 2017-09-14 MED ORDER — BUPIVACAINE LIPOSOME 1.3 % IJ SUSP
INTRAMUSCULAR | Status: DC | PRN
  Administered 2017-09-14: 20 mL

## 2017-09-14 MED ORDER — FENTANYL CITRATE (PF) 250 MCG/5ML IJ SOLN
INTRAMUSCULAR | Status: AC
  Filled 2017-09-14: qty 5

## 2017-09-14 MED ORDER — KETOROLAC TROMETHAMINE 30 MG/ML IJ SOLN
INTRAMUSCULAR | Status: DC | PRN
  Administered 2017-09-14: 30 mg via INTRAVENOUS

## 2017-09-14 MED ORDER — LACTATED RINGERS IV SOLN
INTRAVENOUS | Status: DC
  Administered 2017-09-14 (×2): via INTRAVENOUS

## 2017-09-14 MED ORDER — SCOPOLAMINE 1 MG/3DAYS TD PT72
MEDICATED_PATCH | TRANSDERMAL | Status: AC
  Filled 2017-09-14: qty 1

## 2017-09-14 MED ORDER — PHENYLEPHRINE 40 MCG/ML (10ML) SYRINGE FOR IV PUSH (FOR BLOOD PRESSURE SUPPORT)
PREFILLED_SYRINGE | INTRAVENOUS | Status: AC
  Filled 2017-09-14: qty 10

## 2017-09-14 MED ORDER — PROPOFOL 10 MG/ML IV BOLUS
INTRAVENOUS | Status: AC
  Filled 2017-09-14: qty 20

## 2017-09-14 MED ORDER — KETOROLAC TROMETHAMINE 30 MG/ML IJ SOLN
INTRAMUSCULAR | Status: AC
  Filled 2017-09-14: qty 1

## 2017-09-14 MED ORDER — PANTOPRAZOLE SODIUM 40 MG PO TBEC
40.0000 mg | DELAYED_RELEASE_TABLET | Freq: Every day | ORAL | Status: DC
  Administered 2017-09-15 – 2017-09-16 (×2): 40 mg via ORAL
  Filled 2017-09-14 (×2): qty 1

## 2017-09-14 MED ORDER — SCOPOLAMINE 1 MG/3DAYS TD PT72
MEDICATED_PATCH | TRANSDERMAL | Status: DC | PRN
  Administered 2017-09-14: 1 via TRANSDERMAL

## 2017-09-14 MED ORDER — PROPOFOL 10 MG/ML IV BOLUS
INTRAVENOUS | Status: DC | PRN
  Administered 2017-09-14: 200 mg via INTRAVENOUS

## 2017-09-14 MED ORDER — METOCLOPRAMIDE HCL 5 MG/ML IJ SOLN
5.0000 mg | Freq: Three times a day (TID) | INTRAMUSCULAR | Status: DC | PRN
  Filled 2017-09-14: qty 2

## 2017-09-14 MED ORDER — ONDANSETRON HCL 4 MG/2ML IJ SOLN
INTRAMUSCULAR | Status: DC | PRN
Start: 2017-09-14 — End: 2017-09-14
  Administered 2017-09-14: 4 mg via INTRAVENOUS

## 2017-09-14 MED ORDER — HYDROMORPHONE HCL 1 MG/ML IJ SOLN
0.2500 mg | INTRAMUSCULAR | Status: DC | PRN
  Administered 2017-09-14 (×3): 0.5 mg via INTRAVENOUS

## 2017-09-14 MED ORDER — BUPIVACAINE-EPINEPHRINE (PF) 0.5% -1:200000 IJ SOLN
INTRAMUSCULAR | Status: DC | PRN
  Administered 2017-09-14: 20 mL via PERINEURAL

## 2017-09-14 MED ORDER — ACETAMINOPHEN 650 MG RE SUPP: 650.0000 mg | RECTAL | Status: DC | PRN

## 2017-09-14 MED ORDER — CHLORHEXIDINE GLUCONATE 4 % EX LIQD: 60.0000 mL | Freq: Once | CUTANEOUS | Status: DC

## 2017-09-14 SURGICAL SUPPLY — 78 items
APL SKNCLS STERI-STRIP NONHPOA (GAUZE/BANDAGES/DRESSINGS)
BANDAGE ACE 4X5 VEL STRL LF (GAUZE/BANDAGES/DRESSINGS) ×1 IMPLANT
BANDAGE ESMARK 6X9 LF (GAUZE/BANDAGES/DRESSINGS) ×1 IMPLANT
BENZOIN TINCTURE PRP APPL 2/3 (GAUZE/BANDAGES/DRESSINGS) ×1 IMPLANT
BLADE SAGITTAL 25.0X1.19X90 (BLADE) ×2 IMPLANT
BLADE SAGITTAL 25.0X1.19X90MM (BLADE) ×1
BLADE SAW SGTL 13X75X1.27 (BLADE) ×3 IMPLANT
BLADE SURG 10 STRL SS (BLADE) ×6 IMPLANT
BNDG ADH 5X4 AIR PERM ELC (GAUZE/BANDAGES/DRESSINGS) ×1
BNDG CMPR 9X6 STRL LF SNTH (GAUZE/BANDAGES/DRESSINGS) ×1
BNDG CMPR MED 10X6 ELC LF (GAUZE/BANDAGES/DRESSINGS) ×1
BNDG COHESIVE 4X5 WHT NS (GAUZE/BANDAGES/DRESSINGS) ×2 IMPLANT
BNDG ELASTIC 6X10 VLCR STRL LF (GAUZE/BANDAGES/DRESSINGS) ×3 IMPLANT
BNDG ESMARK 6X9 LF (GAUZE/BANDAGES/DRESSINGS) ×3
BOWL SMART MIX CTS (DISPOSABLE) ×3 IMPLANT
CAPT KNEE TOTAL 3 ATTUNE ×2 IMPLANT
CEMENT HV SMART SET (Cement) ×6 IMPLANT
CLOSURE WOUND 1/2 X4 (GAUZE/BANDAGES/DRESSINGS)
COVER SURGICAL LIGHT HANDLE (MISCELLANEOUS) ×3 IMPLANT
CUFF TOURNIQUET SINGLE 34IN LL (TOURNIQUET CUFF) ×3 IMPLANT
CUFF TOURNIQUET SINGLE 44IN (TOURNIQUET CUFF) IMPLANT
DECANTER SPIKE VIAL GLASS SM (MISCELLANEOUS) ×2 IMPLANT
DRAPE ORTHO SPLIT 77X108 STRL (DRAPES) ×6
DRAPE SURG ORHT 6 SPLT 77X108 (DRAPES) ×2 IMPLANT
DRAPE U-SHAPE 47X51 STRL (DRAPES) ×3 IMPLANT
DRSG AQUACEL AG ADV 3.5X10 (GAUZE/BANDAGES/DRESSINGS) ×2 IMPLANT
DRSG PAD ABDOMINAL 8X10 ST (GAUZE/BANDAGES/DRESSINGS) ×1 IMPLANT
DURAPREP 26ML APPLICATOR (WOUND CARE) ×6 IMPLANT
ELECT REM PT RETURN 9FT ADLT (ELECTROSURGICAL) ×3
ELECTRODE REM PT RTRN 9FT ADLT (ELECTROSURGICAL) ×1 IMPLANT
EVACUATOR 1/8 PVC DRAIN (DRAIN) IMPLANT
FACESHIELD WRAPAROUND (MASK) ×9 IMPLANT
FACESHIELD WRAPAROUND OR TEAM (MASK) ×2 IMPLANT
GAUZE SPONGE 4X4 12PLY STRL (GAUZE/BANDAGES/DRESSINGS) ×1 IMPLANT
GAUZE XEROFORM 5X9 LF (GAUZE/BANDAGES/DRESSINGS) ×1 IMPLANT
GLOVE BIOGEL PI IND STRL 8 (GLOVE) ×2 IMPLANT
GLOVE BIOGEL PI INDICATOR 8 (GLOVE) ×4
GLOVE ORTHO TXT STRL SZ7.5 (GLOVE) ×6 IMPLANT
GOWN STRL REUS W/ TWL LRG LVL3 (GOWN DISPOSABLE) ×1 IMPLANT
GOWN STRL REUS W/ TWL XL LVL3 (GOWN DISPOSABLE) ×1 IMPLANT
GOWN STRL REUS W/TWL 2XL LVL3 (GOWN DISPOSABLE) ×3 IMPLANT
GOWN STRL REUS W/TWL LRG LVL3 (GOWN DISPOSABLE) ×3
GOWN STRL REUS W/TWL XL LVL3 (GOWN DISPOSABLE) ×6
HANDPIECE INTERPULSE COAX TIP (DISPOSABLE) ×3
IMMOBILIZER KNEE 22 UNIV (SOFTGOODS) ×3 IMPLANT
KIT BASIN OR (CUSTOM PROCEDURE TRAY) ×3 IMPLANT
KIT ROOM TURNOVER OR (KITS) ×3 IMPLANT
MANIFOLD NEPTUNE II (INSTRUMENTS) ×3 IMPLANT
MARKER SKIN DUAL TIP RULER LAB (MISCELLANEOUS) ×3 IMPLANT
NDL 18GX1X1/2 (RX/OR ONLY) (NEEDLE) ×1 IMPLANT
NDL HYPO 25GX1X1/2 BEV (NEEDLE) ×1 IMPLANT
NDL SPNL 18GX3.5 QUINCKE PK (NEEDLE) IMPLANT
NEEDLE 18GX1X1/2 (RX/OR ONLY) (NEEDLE) IMPLANT
NEEDLE HYPO 25GX1X1/2 BEV (NEEDLE) ×3 IMPLANT
NEEDLE SPNL 18GX3.5 QUINCKE PK (NEEDLE) ×3 IMPLANT
NS IRRIG 1000ML POUR BTL (IV SOLUTION) ×3 IMPLANT
PACK TOTAL JOINT (CUSTOM PROCEDURE TRAY) ×3 IMPLANT
PAD ARMBOARD 7.5X6 YLW CONV (MISCELLANEOUS) ×6 IMPLANT
PAD CAST 4YDX4 CTTN HI CHSV (CAST SUPPLIES) ×1 IMPLANT
PADDING CAST COTTON 4X4 STRL (CAST SUPPLIES)
PADDING CAST COTTON 6X4 STRL (CAST SUPPLIES) ×1 IMPLANT
SET HNDPC FAN SPRY TIP SCT (DISPOSABLE) ×1 IMPLANT
STAPLER VISISTAT 35W (STAPLE) ×2 IMPLANT
STRIP CLOSURE SKIN 1/2X4 (GAUZE/BANDAGES/DRESSINGS) ×2 IMPLANT
SUCTION FRAZIER HANDLE 10FR (MISCELLANEOUS) ×2
SUCTION TUBE FRAZIER 10FR DISP (MISCELLANEOUS) ×1 IMPLANT
SUT VIC AB 0 CT1 27 (SUTURE) ×6
SUT VIC AB 0 CT1 27XBRD ANBCTR (SUTURE) ×1 IMPLANT
SUT VIC AB 1 CTX 36 (SUTURE) ×6
SUT VIC AB 1 CTX36XBRD ANBCTR (SUTURE) ×2 IMPLANT
SUT VIC AB 2-0 CT1 27 (SUTURE) ×6
SUT VIC AB 2-0 CT1 TAPERPNT 27 (SUTURE) ×2 IMPLANT
SUT VIC AB 3-0 X1 27 (SUTURE) ×3 IMPLANT
SYR 50ML LL SCALE MARK (SYRINGE) ×3 IMPLANT
SYR CONTROL 10ML LL (SYRINGE) ×3 IMPLANT
TOWEL OR 17X24 6PK STRL BLUE (TOWEL DISPOSABLE) ×3 IMPLANT
TOWEL OR 17X26 10 PK STRL BLUE (TOWEL DISPOSABLE) ×3 IMPLANT
TRAY CATH 16FR W/PLASTIC CATH (SET/KITS/TRAYS/PACK) IMPLANT

## 2017-09-14 NOTE — Anesthesia Procedure Notes (Signed)
Procedure Name: LMA Insertion Date/Time: 09/14/2017 1:11 PM Performed by: Genelle Bal, CRNA Pre-anesthesia Checklist: Patient identified, Emergency Drugs available, Suction available and Patient being monitored Patient Re-evaluated:Patient Re-evaluated prior to induction Oxygen Delivery Method: Circle system utilized Preoxygenation: Pre-oxygenation with 100% oxygen Induction Type: IV induction Ventilation: Mask ventilation without difficulty LMA: LMA inserted LMA Size: 5.0 Number of attempts: 1 Airway Equipment and Method: Bite block Placement Confirmation: positive ETCO2 Tube secured with: Tape Dental Injury: Teeth and Oropharynx as per pre-operative assessment

## 2017-09-14 NOTE — Progress Notes (Signed)
Orthopedic Tech Progress Note Patient Details:  Taylor Schroeder Oct 22, 1965 [id number]  CPM Right Knee CPM Right Knee: On Right Knee Flexion (Degrees): 90 Right Knee Extension (Degrees): 0  Post Interventions Patient Tolerated: Well Instructions Provided: Care of device Ortho Devices Ortho Device/Splint Location: applied ohf to bed Ortho Device/Splint Interventions: Application, Ordered, Adjustment   Post Interventions Patient Tolerated: Well Instructions Provided: Care of device   [name] 09/14/2017, 4:33 PM

## 2017-09-14 NOTE — Brief Op Note (Signed)
09/14/2017  3:35 PM  PATIENT:  Taylor Schroeder  51 y.o. male  PRE-OPERATIVE DIAGNOSIS:  Right Knee Osteoarthritis  POST-OPERATIVE DIAGNOSIS:  Right Knee Osteoarthritis  PROCEDURE:  Procedure(s): RIGHT TOTAL KNEE ARTHROPLASTY (Right)  SURGEON:  Surgeon(s) and Role:    * [name], MD - Primary  PHYSICIAN ASSISTANT: Benjiman Core pa-c     ANESTHESIA:   general  EBL:  300 mL   BLOOD ADMINISTERED:none  DRAINS: none   LOCAL MEDICATIONS USED:  MARCAINE/exparel  SPECIMEN:  No Specimen  DISPOSITION OF SPECIMEN:  N/A  COUNTS:  YES  TOURNIQUET:   Total Tourniquet Time Documented: Thigh (Right) - 61 minutes Total: Thigh (Right) - 61 minutes   DICTATION: .Viviann Spare Dictation  PLAN OF CARE: Admit to inpatient   PATIENT DISPOSITION:  PACU - hemodynamically stable.

## 2017-09-14 NOTE — Interval H&P Note (Signed)
History and Physical Interval Note:  09/14/2017 12:32 PM  Taylor Schroeder  has presented today for surgery, with the diagnosis of Right Knee Osteoarthritis  The various methods of treatment have been discussed with the patient and family. After consideration of risks, benefits and other options for treatment, the patient has consented to  Procedure(s): RIGHT TOTAL KNEE ARTHROPLASTY (Right) as a surgical intervention .  The patient's history has been reviewed, patient examined, no change in status, stable for surgery.  I have reviewed the patient's chart and labs.  Questions were answered to the patient's satisfaction.     [name]

## 2017-09-14 NOTE — Transfer of Care (Signed)
Immediate Anesthesia Transfer of Care Note  Patient: Taylor Schroeder  Procedure(s) Performed: RIGHT TOTAL KNEE ARTHROPLASTY (Right Knee)  Patient Location: PACU  Anesthesia Type:GA combined with regional for post-op pain  Level of Consciousness: awake, alert  and oriented  Airway & Oxygen Therapy: Patient Spontanous Breathing and Patient connected to face mask oxygen  Post-op Assessment: Report given to RN and Post -op Vital signs reviewed and stable  Post vital signs: Reviewed and stable  Last Vitals:  Vitals:   09/14/17 1140  BP: (!) 151/99  Pulse: 85  Resp: 20  Temp: 36.5 C  SpO2: 96%    Last Pain:  Vitals:   09/14/17 1140  PainSc: 4       Patients Stated Pain Goal: 3 (20/94/70 9628)  Complications: No apparent anesthesia complications

## 2017-09-14 NOTE — Anesthesia Preprocedure Evaluation (Addendum)
Anesthesia Evaluation  Patient identified by MRN, date of birth, ID band Patient awake    Reviewed: Allergy & Precautions, H&P , Patient's Chart, lab work & pertinent test results  Airway Mallampati: III  TM Distance: >3 FB Neck ROM: full    Dental no notable dental hx. (+) Dental Advisory Given   Pulmonary asthma , sleep apnea , former smoker,    Pulmonary exam normal breath sounds clear to auscultation       Cardiovascular Exercise Tolerance: Good hypertension,  Rhythm:regular Rate:Normal     Neuro/Psych    GI/Hepatic   Endo/Other    Renal/GU      Musculoskeletal   Abdominal   Peds  Hematology   Anesthesia Other Findings   Reproductive/Obstetrics                            Anesthesia Physical Anesthesia Plan  ASA: III  Anesthesia Plan: Spinal   Post-op Pain Management:    Induction:   PONV Risk Score and Plan: 2 and Dexamethasone, Ondansetron and Treatment may vary due to age or medical condition  Airway Management Planned:   Additional Equipment:   Intra-op Plan:   Post-operative Plan:   Informed Consent: I have reviewed the patients History and Physical, chart, labs and discussed the procedure including the risks, benefits and alternatives for the proposed anesthesia with the patient or authorized representative who has indicated his/her understanding and acceptance.     Plan Discussed with:   Anesthesia Plan Comments: (  )       Anesthesia Quick Evaluation

## 2017-09-14 NOTE — Anesthesia Procedure Notes (Signed)
Spinal  Patient location during procedure: OR Staffing Anesthesiologist: Lyndle Herrlich, MD Spinal Block Patient position: sitting Prep: DuraPrep Patient monitoring: heart rate, blood pressure and continuous pulse ox Approach: right paramedian Location: L3-4 Injection technique: single-shot Needle Needle type: Tuohy  Needle gauge: 24 G Needle length: 9 cm Assessment Sensory level: T4 Additional Notes Could not get CSF; Pt habitus makes this anesthetic challenging   Terminated SAB

## 2017-09-14 NOTE — Op Note (Signed)
Preop diagnosis: Right knee primary osteoarthritis  Postop diagnosis: Same  Procedure: Right cemented total knee arthroplasty  Surgeon: Rodell Perna MD  Assistant: Benjiman Core PA-C medically necessary and present for the entire procedure  Anesthesia: Preoperative nerve block, attempted spinal, conversion to LMA general.  Plus Exparel Marcaine injection  Tourniquet: 350 x 1 hour  EBL: See anesthetic record  Procedure after attempted spinal converted to general LMA tube placement proximal thigh tourniquet DuraPrep from the tourniquet to the tip of toes impervious stockinette split sheets drapes sterile skin marker Betadine Steri-Drape Coban were applied.  Leg was wrapped in Esmarch preoperative timeout and Ancef was given prophylactically.  Midline incision was made medial parapatellar incision.  TXA had been infused by CRNA.  Tricompartmental degenerative changes with varus deformity and medial compartment erosion was noted.  10 mm was taken off the distal femur 9 mm off the tibia.  Spacer gap was 7 mm.  Box cut chamfer cuts had been made and sizing on the femur was a #5.  Tibia was #6 and 7 mm Depuy rotating platform Attune knee system was used.  Patella was cut 41 mm 3 peg patella.  Drill holes were placed in the femur for the lugs.  Pulse lavage release of the medial collateral ligament off the tibia deep fibers due to the varus deformity was performed which gave good balance in flexion extension.  Vacuum mixing of the cement pulse lavage preparation of the bone and cementing of the tibia followed by femur placement of the poly-permanent 7 mm rotating platform and patella held with a patellar clamp.  Cement was hard to 15 minutes.  The tourniquet was deflated hemostasis obtained in standard layered closure performed.  Patient tolerated the procedure well and was transferred to the recovery room after postoperative dressing applied.  Exparel and Marcaine 20+20 was infiltrated with an 18-gauge spinal  needle into the deep capsule and subcutaneous tissue for postoperative analgesia.

## 2017-09-15 ENCOUNTER — Other Ambulatory Visit: Payer: Self-pay

## 2017-09-15 ENCOUNTER — Encounter (HOSPITAL_COMMUNITY): Payer: Self-pay | Admitting: General Practice

## 2017-09-15 LAB — CBC
HEMATOCRIT: 35.4 % — AB (ref 39.0–52.0)
HEMOGLOBIN: 11.8 g/dL — AB (ref 13.0–17.0)
MCH: 30.4 pg (ref 26.0–34.0)
MCHC: 33.3 g/dL (ref 30.0–36.0)
MCV: 91.2 fL (ref 78.0–100.0)
Platelets: 252 10*3/uL (ref 150–400)
RBC: 3.88 MIL/uL — AB (ref 4.22–5.81)
RDW: 13.3 % (ref 11.5–15.5)
WBC: 13.1 10*3/uL — ABNORMAL HIGH (ref 4.0–10.5)

## 2017-09-15 LAB — BASIC METABOLIC PANEL
ANION GAP: 6 (ref 5–15)
BUN: 20 mg/dL (ref 6–20)
CHLORIDE: 105 mmol/L (ref 101–111)
CO2: 26 mmol/L (ref 22–32)
Calcium: 8.5 mg/dL — ABNORMAL LOW (ref 8.9–10.3)
Creatinine, Ser: 1.02 mg/dL (ref 0.61–1.24)
GFR calc Af Amer: >60 mL/min (ref >60)
GFR calc non Af Amer: >60 mL/min (ref >60)
GLUCOSE: 119 mg/dL — AB (ref 65–99)
POTASSIUM: 4.2 mmol/L (ref 3.5–5.1)
Sodium: 137 mmol/L (ref 135–145)

## 2017-09-15 MED ORDER — OXYCODONE HCL 5 MG PO TABS
5.0000 mg | ORAL_TABLET | ORAL | Status: DC | PRN
  Administered 2017-09-15 (×2): 5 mg via ORAL
  Administered 2017-09-16 (×4): 10 mg via ORAL
  Filled 2017-09-15: qty 2
  Filled 2017-09-15: qty 1
  Filled 2017-09-15: qty 2
  Filled 2017-09-15: qty 1
  Filled 2017-09-15 (×2): qty 2

## 2017-09-15 NOTE — Progress Notes (Signed)
Patient setup on CPAP at auto mode 5-20 cmH20. With no O2 bleeding in. Patient tolerating well and will call if any further assistance needed.

## 2017-09-15 NOTE — Anesthesia Postprocedure Evaluation (Signed)
Anesthesia Post Note  Patient: Taylor Schroeder  Procedure(s) Performed: RIGHT TOTAL KNEE ARTHROPLASTY (Right Knee)     Patient location during evaluation: PACU Anesthesia Type: Spinal and General Level of consciousness: awake and alert Pain management: pain level controlled Vital Signs Assessment: post-procedure vital signs reviewed and stable Respiratory status: spontaneous breathing, nonlabored ventilation, respiratory function stable and patient connected to nasal cannula oxygen Cardiovascular status: blood pressure returned to baseline and stable Postop Assessment: no apparent nausea or vomiting Anesthetic complications: no    Last Vitals:  Vitals:   09/14/17 2341 09/15/17 0412  BP: 111/72 101/64  Pulse: 94 90  Resp: 18 18  Temp: 36.5 C 36.6 C  SpO2: 95% 97%    Last Pain:  Vitals:   09/15/17 0412  TempSrc: Oral  PainSc:                  Riccardo Dubin

## 2017-09-15 NOTE — Evaluation (Signed)
Occupational Therapy Evaluation Patient Details Name: Taylor Schroeder MRN: [id number] DOB: 1966-06-04 Today's Date: 09/15/2017    History of Present Illness 51 yo male s/p RTKA. PMHx: LTKA   Clinical Impression   This 51 yo male admitted and underwent above presents to acute OT with increased pain, decreased AROM of RLE, decreased balance all affecting him being independent with his basic ADLs. He will benefit from one more session of OT to address tub transfer with 3n1.    Follow Up Recommendations  No OT follow up;Supervision - Intermittent    Equipment Recommendations  3 in 1 bedside commode;Other (comment)(RW)       Precautions / Restrictions Precautions Precautions: Fall;Knee Required Braces or Orthoses: Knee Immobilizer - Right Knee Immobilizer - Right: (did not use due to can straight leg raise in bed without issues) Restrictions Weight Bearing Restrictions: (P) No Other Position/Activity Restrictions: (P) WBAT      Mobility Bed Mobility Overal bed mobility: Needs Assistance Bed Mobility: Supine to Sit     Supine to sit: Min assist;HOB elevated     General bed mobility comments: for RLE only  Transfers Overall transfer level: Needs assistance Equipment used: Rolling walker (2 wheeled) Transfers: Sit to/from [facility] Sit to Stand: Min assist;From elevated surface Stand pivot transfers: Min assist            Balance Overall balance assessment: Needs assistance Sitting-balance support: No upper extremity supported;Feet supported Sitting balance-Leahy Scale: Good     Standing balance support: Bilateral upper extremity supported Standing balance-Leahy Scale: Poor Standing balance comment: reliant on RW in standing                           ADL either performed or assessed with clinical judgement   ADL Overall ADL's : Needs assistance/impaired Eating/Feeding: Independent;Sitting   Grooming: Set up;Sitting   Upper Body  Bathing: Set up;Sitting   Lower Body Bathing: Minimal assistance;Sit to/from stand   Upper Body Dressing : Set up;Sitting   Lower Body Dressing: Minimal assistance;Sit to/from stand   Toilet Transfer: Minimal assistance;Stand-pivot;RW Armed forces technical officer Details (indicate cue type and reason): bed>recliner next to bed Toileting- Clothing Manipulation and Hygiene: Minimal assistance;Sit to/from stand         General ADL Comments: We did discuss that the only thing different about getting underwear and pants on this time was that he needed to put his RLE in first and then his LLE     Vision Patient Visual Report: No change from baseline              Pertinent Vitals/Pain Pain Assessment: (P) 0-10 Pain Score: (P) 7  Pain Location: (P) left knee Pain Descriptors / Indicators: (P) Aching;Sore Pain Intervention(s): (P) Limited activity within patient's tolerance;Monitored during session     Hand Dominance (P) Right   Extremity/Trunk Assessment Upper Extremity Assessment Upper Extremity Assessment: Overall WFL for tasks assessed   Lower Extremity Assessment Lower Extremity Assessment: (P) Overall WFL for tasks assessed(LLE 3/5 globally)       Communication Communication Communication: (P) No difficulties   Cognition Arousal/Alertness: (P) Awake/alert Behavior During Therapy: (P) WFL for tasks assessed/performed Overall Cognitive Status: (P) Within Functional Limits for tasks assessed  Home Living Family/patient expects to be discharged to:: (P) Private residence Living Arrangements: (P) Spouse/significant other Available Help at Discharge: (P) Family;Available 24 hours/day Type of Home: (P) House Home Access: (P) Stairs to enter Entrance Stairs-Number of Steps: (P) 2 Entrance Stairs-Rails: (P) Right;Left Home Layout: (P) One level     Bathroom Shower/Tub: (P) Tub/shower unit;Curtain   Bathroom  Toilet: (P) Standard     Home Equipment: (P) None          Prior Functioning/Environment Level of Independence: (P) Independent        Comments: (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        OT Problem List: Decreased range of motion;Pain;Impaired balance (sitting and/or standing)         OT Goals(Current goals can be found in the care plan section) Acute Rehab OT Goals Patient Stated Goal: home tomorrow OT Goal Formulation: With patient Time For Goal Achievement: 09/22/17 Potential to Achieve Goals: Good  OT Frequency:                AM-PAC PT "6 Clicks" Daily Activity     Outcome Measure Help from another person eating meals?: None Help from another person taking care of personal grooming?: A Little Help from another person toileting, which includes using toliet, bedpan, or urinal?: A Little Help from another person bathing (including washing, rinsing, drying)?: A Little Help from another person to put on and taking off regular upper body clothing?: A Little Help from another person to put on and taking off regular lower body clothing?: A Lot 6 Click Score: 18   End of Session Equipment Utilized During Treatment: Gait belt;Rolling walker  Activity Tolerance: Patient tolerated treatment well Patient left: in chair;with call bell/phone within reach  OT Visit Diagnosis: Unsteadiness on feet (R26.81);Pain Pain - Right/Left: Right Pain - part of body: Knee                Time: 7353-2992 OT Time Calculation (min): 19 min Charges:  OT General Charges $OT Visit: 1 Visit OT Evaluation $OT Eval Moderate Complexity: [address], [place] [phone] 09/15/2017

## 2017-09-15 NOTE — Evaluation (Signed)
Physical Therapy Evaluation Patient Details Name: Taylor Schroeder MRN: [id number] DOB: [birth year] Today's Date: 09/15/2017   History of Present Illness  51 yo male s/p RTKA. PMHx: LTKA  Clinical Impression  Patient is s/p above surgery resulting in functional limitations due to the deficits listed below (see PT Problem List). PTA, pt was independent living in 1 story home with wife who is avail for support 24/7, with 3 stairs to enter. Upon eval, patient presenting with post op pain and weakness that limits his mobility. Currently min A level with transfers and min guard with ambulation for short distances with RW. Session focused on ambulation, and will continue to progress activity in subsequent sessions. Patient familiar with precaution and therex from prior surgery with good memory of details.  Patient will benefit from skilled PT to increase their independence and safety with mobility to allow discharge to the venue listed below.       Follow Up Recommendations Home health PT;Supervision/Assistance - 24 hour;DC plan and follow up therapy as arranged by surgeon    Equipment Recommendations  Rolling walker with 5" wheels;3in1 (PT)    Recommendations for Other Services       Precautions / Restrictions Precautions Precautions: Fall;Knee Required Braces or Orthoses: Knee Immobilizer - Right Knee Immobilizer - Right: (did not use due to can straight leg raise in bed without issues) Restrictions Weight Bearing Restrictions: No Other Position/Activity Restrictions: WBAT      Mobility  Bed Mobility Overal bed mobility: Needs Assistance Bed Mobility: Supine to Sit     Supine to sit: Min assist;HOB elevated     General bed mobility comments: for RLE only  Transfers Overall transfer level: Needs assistance Equipment used: Rolling walker (2 wheeled) Transfers: Sit to/from [facility] Sit to Stand: Min assist;From elevated surface Stand pivot transfers: Min  assist       General transfer comment: min A to min guard to help power up into RW. patient demo good hand placement and safety with RW  Ambulation/Gait Ambulation/Gait assistance: Min guard Ambulation Distance (Feet): 75 Feet Assistive device: Rolling walker (2 wheeled) Gait Pattern/deviations: Step-to pattern;Step-through pattern;Antalgic Gait velocity: decreased   General Gait Details: Patient with intermittent step through gait, cues for sequencing. 1x knee buckle without LOB.   Stairs            Wheelchair Mobility    Modified Rankin (Stroke Patients Only)       Balance Overall balance assessment: Needs assistance Sitting-balance support: No upper extremity supported;Feet supported Sitting balance-Leahy Scale: Good     Standing balance support: Bilateral upper extremity supported Standing balance-Leahy Scale: Poor Standing balance comment: reliant on RW in standing                             Pertinent Vitals/Pain Pain Assessment: 0-10 Pain Score: 7  Pain Location: left knee Pain Descriptors / Indicators: Aching;Sore Pain Intervention(s): Limited activity within patient's tolerance;Monitored during session    Reliance expects to be discharged to:: Private residence Living Arrangements: Spouse/significant other Available Help at Discharge: Family;Available 24 hours/day Type of Home: House Home Access: Stairs to enter Entrance Stairs-Rails: Psychiatric nurse of Steps: 2 Home Layout: One level Home Equipment: None      Prior Function Level of Independence: Independent         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 [employer]   Dominant Hand: Right  Extremity/Trunk Assessment   Upper Extremity Assessment Upper Extremity Assessment: Overall WFL for tasks assessed    Lower Extremity Assessment Lower Extremity Assessment: Overall WFL for tasks assessed(LLE 3/5  globally)       Communication   Communication: No difficulties  Cognition Arousal/Alertness: Awake/alert Behavior During Therapy: WFL for tasks assessed/performed Overall Cognitive Status: Within Functional Limits for tasks assessed                                        General Comments General comments (skin integrity, edema, etc.): VSS throughout session.    Exercises Total Joint Exercises Ankle Circles/Pumps: AROM;Both;10 reps Quad Sets: AROM;Both;10 reps Long Arc Quad: AROM;Both;10 reps Knee Flexion: AROM;Both;10 reps   Assessment/Plan    PT Assessment Patient needs continued PT services  PT Problem List Decreased strength;Decreased range of motion;Decreased activity tolerance;Decreased balance;Decreased mobility;Decreased coordination;Decreased knowledge of use of DME;Pain       PT Treatment Interventions DME instruction;Gait training;Stair training;Functional mobility training;Therapeutic activities;Therapeutic exercise;Balance training    PT Goals (Current goals can be found in the Care Plan section)  Acute Rehab PT Goals Patient Stated Goal: home tomorrow PT Goal Formulation: With patient Time For Goal Achievement: 09/22/17 Potential to Achieve Goals: Good    Frequency 7X/week   Barriers to discharge        Co-evaluation               AM-PAC PT "6 Clicks" Daily Activity  Outcome Measure Difficulty turning over in bed (including adjusting bedclothes, sheets and blankets)?: A Little Difficulty moving from lying on back to sitting on the side of the bed? : A Little Difficulty sitting down on and standing up from a chair with arms (e.g., wheelchair, bedside commode, etc,.)?: A Little Help needed moving to and from a bed to chair (including a wheelchair)?: A Little Help needed walking in hospital room?: A Little Help needed climbing 3-5 steps with a railing? : A Little 6 Click Score: 18    End of Session Equipment Utilized During  Treatment: Gait belt Activity Tolerance: Patient tolerated treatment well Patient left: in chair;with call bell/phone within reach Nurse Communication: Mobility status PT Visit Diagnosis: Unsteadiness on feet (R26.81);Other abnormalities of gait and mobility (R26.89);Repeated falls (R29.6);Muscle weakness (generalized) (M62.81);Pain Pain - Right/Left: Left    Time: 0272-5366 PT Time Calculation (min) (ACUTE ONLY): 27 min   Charges:   PT Evaluation $PT Eval Low Complexity: 1 Low PT Treatments $Gait Training: 8-22 mins   PT G Codes:        Reinaldo Berber, PT, DPT Acute Rehab Services Pager: [phone]    Reinaldo Berber 09/15/2017, 11:35 AM

## 2017-09-15 NOTE — Progress Notes (Signed)
   Subjective: 1 Day Post-Op Procedure(s) (LRB): RIGHT TOTAL KNEE ARTHROPLASTY (Right) Patient reports pain as mild.    Objective: Vital signs in last 24 hours: Temp:  [97 F (36.1 C)-98.2 F (36.8 C)] 97.9 F (36.6 C) (12/20 0412) Pulse Rate:  [85-113] 90 (12/20 0412) Resp:  [10-20] 18 (12/20 0412) BP: (101-162)/(64-104) 101/64 (12/20 0412) SpO2:  [91 %-100 %] 97 % (12/20 0412) Weight:  [270 lb 12.8 oz (122.8 kg)] 270 lb 12.8 oz (122.8 kg) (12/19 1140)  Intake/Output from previous day: 12/19 0701 - 12/20 0700 In: 1640 [P.O.:240; I.V.:1400] Out: 1100 [Urine:800; Blood:300] Intake/Output this shift: No intake/output data recorded.  No results for input(s): HGB in the last 72 hours. No results for input(s): WBC, RBC, HCT, PLT in the last 72 hours. No results for input(s): NA, K, CL, CO2, BUN, CREATININE, GLUCOSE, CALCIUM in the last 72 hours. No results for input(s): LABPT, INR in the last 72 hours.  Neurologically intact No results found.  Assessment/Plan: 1 Day Post-Op Procedure(s) (LRB): RIGHT TOTAL KNEE ARTHROPLASTY (Right) Up with therapy, saline lock IV. Dressing change. Plan Home Friday.   Taylor Schroeder 09/15/2017, 7:27 AM

## 2017-09-15 NOTE — Care Management Note (Signed)
Case Management Note  Patient Details  Name: Taylor Schroeder MRN: [id number] Date of Birth: 01/31/1966  Subjective/Objective:  51 yr old gentleman s/p right total knee arthroplasty.                Action/Plan: Case manager spoke with patient concerning discharge plan and DME. Choice for Home Health agency was offered, patient wants to use [facility] agency he used with previous knee surgery. CM confirmed he had been with [facility]. Referral was called to [place], [place] Liaison. CM ordered DME. Patient will have support at discharge.    Expected Discharge Date:   09/16/17               Expected Discharge Plan:  [place]  In-House Referral:  NA  Discharge planning Services  CM Consult  Post Acute Care Choice:  Home Health, Durable Medical Equipment Choice offered to:  Patient  DME Arranged:  3-N-1, Walker platform DME Agency:  [place]:  PT [place] Agency:  [place]  Status of Service:  Completed, signed off  If discussed at [place] of Stay Meetings, dates discussed:    Additional Comments:  Ninfa Meeker, RN 09/15/2017, 11:19 AM

## 2017-09-15 NOTE — Progress Notes (Signed)
Physical Therapy Treatment Patient Details Name: Taylor Schroeder MRN: [id number] DOB: 16-Mar-1966 Today's Date: 09/15/2017    History of Present Illness 51 yo male s/p RTKA. PMHx: LTKA    PT Comments    Session focused on gait training. Patient demonstrating improved activity tolerance ambulating further distances without increases in pain, and better mechanics. Tomorrows session will address stair training, anticipate patient will do well.   Follow Up Recommendations  Home health PT;Supervision/Assistance - 24 hour;DC plan and follow up therapy as arranged by surgeon     Equipment Recommendations  Rolling walker with 5" wheels;3in1 (PT)    Recommendations for Other Services       Precautions / Restrictions Precautions Precautions: Fall;Knee Precaution Booklet Issued: Yes (comment) Precaution Comments: reviewed no pillow under knee and supine therex Restrictions Weight Bearing Restrictions: No RLE Weight Bearing: Weight bearing as tolerated    Mobility  Bed Mobility Overal bed mobility: Needs Assistance Bed Mobility: Supine to Sit     Supine to sit: Supervision;HOB elevated     General bed mobility comments: able to supine<>sit without physical assistance   Transfers Overall transfer level: Needs assistance Equipment used: Rolling walker (2 wheeled) Transfers: Sit to/from Stand Sit to Stand: Min assist;From elevated surface Stand pivot transfers: Min assist       General transfer comment: min A to min guard to help power up into RW. patient demo good hand placement and safety with RW  Ambulation/Gait Ambulation/Gait assistance: Min guard Ambulation Distance (Feet): 200 Feet Assistive device: Rolling walker (2 wheeled) Gait Pattern/deviations: Step-to pattern;Step-through pattern;Antalgic Gait velocity: decreased   General Gait Details: no knee buckle this session. increased distance   Financial trader Rankin  (Stroke Patients Only)       Balance Overall balance assessment: Needs assistance Sitting-balance support: No upper extremity supported;Feet supported Sitting balance-Leahy Scale: Good     Standing balance support: Bilateral upper extremity supported Standing balance-Leahy Scale: Poor                              Cognition Arousal/Alertness: Awake/alert Behavior During Therapy: WFL for tasks assessed/performed Overall Cognitive Status: Within Functional Limits for tasks assessed                                        Exercises      General Comments General comments (skin integrity, edema, etc.): VSS throughout sesion      Pertinent Vitals/Pain Pain Assessment: Faces Faces Pain Scale: Hurts even more Pain Location: right knee Pain Descriptors / Indicators: Aching;Sore Pain Intervention(s): Limited activity within patient's tolerance;Monitored during session    Home Living                      Prior Function            PT Goals (current goals can now be found in the care plan section) Acute Rehab PT Goals Patient Stated Goal: home tomorrow PT Goal Formulation: With patient Time For Goal Achievement: 09/22/17 Potential to Achieve Goals: Good Progress towards PT goals: Progressing toward goals    Frequency    7X/week      PT Plan Current plan remains appropriate    Co-evaluation  AM-PAC PT "6 Clicks" Daily Activity  Outcome Measure  Difficulty turning over in bed (including adjusting bedclothes, sheets and blankets)?: A Little Difficulty moving from lying on back to sitting on the side of the bed? : A Little Difficulty sitting down on and standing up from a chair with arms (e.g., wheelchair, bedside commode, etc,.)?: A Little Help needed moving to and from a bed to chair (including a wheelchair)?: A Little Help needed walking in hospital room?: A Little Help needed climbing 3-5 steps with a  railing? : A Little 6 Click Score: 18    End of Session Equipment Utilized During Treatment: Gait belt Activity Tolerance: Patient tolerated treatment well Patient left: in chair;with call bell/phone within reach Nurse Communication: Mobility status PT Visit Diagnosis: Unsteadiness on feet (R26.81);Other abnormalities of gait and mobility (R26.89);Repeated falls (R29.6);Muscle weakness (generalized) (M62.81);Pain Pain - Right/Left: Left     Time: 1610-9604 PT Time Calculation (min) (ACUTE ONLY): 29 min  Charges:  $Gait Training: 23-37 mins                    G Codes:       Reinaldo Berber, PT, DPT Acute Rehab Services Pager: [phone]    Reinaldo Berber 09/15/2017, 4:57 PM

## 2017-09-16 ENCOUNTER — Encounter (HOSPITAL_COMMUNITY): Payer: Self-pay | Admitting: Orthopaedic Surgery

## 2017-09-16 LAB — CBC
HCT: 35.3 % — ABNORMAL LOW (ref 39.0–52.0)
Hemoglobin: 11.9 g/dL — ABNORMAL LOW (ref 13.0–17.0)
MCH: 30.6 pg (ref 26.0–34.0)
MCHC: 33.7 g/dL (ref 30.0–36.0)
MCV: 90.7 fL (ref 78.0–100.0)
PLATELETS: 233 10*3/uL (ref 150–400)
RBC: 3.89 MIL/uL — AB (ref 4.22–5.81)
RDW: 13.4 % (ref 11.5–15.5)
WBC: 12.2 10*3/uL — ABNORMAL HIGH (ref 4.0–10.5)

## 2017-09-16 MED ORDER — IBUPROFEN 200 MG PO TABS: 800.0000 mg | ORAL_TABLET | Freq: Two times a day (BID) | ORAL | 0 refills | Status: DC

## 2017-09-16 MED ORDER — METHOCARBAMOL 500 MG PO TABS: 500.0000 mg | ORAL_TABLET | Freq: Four times a day (QID) | ORAL | 1 refills | Status: DC | PRN

## 2017-09-16 MED ORDER — ASPIRIN 325 MG PO TBEC: 325.0000 mg | DELAYED_RELEASE_TABLET | Freq: Every day | ORAL | 0 refills | Status: DC

## 2017-09-16 MED ORDER — OXYCODONE-ACETAMINOPHEN 5-325 MG PO TABS: 1.0000 | ORAL_TABLET | ORAL | 0 refills | Status: DC | PRN

## 2017-09-16 NOTE — Progress Notes (Signed)
Physical Therapy Treatment Patient Details Name: Taylor Schroeder MRN: [id number] DOB: 04/13/1966 Today's Date: 09/16/2017    History of Present Illness 51 yo male s/p RTKA. PMHx: LTKA    PT Comments    Patient with limited gait distance this am due to feeling fatigued. Pt was able to safely ascend/descend 2 steps with min guard assist. Continue to progress as tolerated.    Follow Up Recommendations  Home health PT;Supervision/Assistance - 24 hour;DC plan and follow up therapy as arranged by surgeon     Equipment Recommendations  Rolling walker with 5" wheels;3in1 (PT)    Recommendations for Other Services       Precautions / Restrictions Precautions Precautions: Fall;Knee Precaution Booklet Issued: Yes (comment) Precaution Comments: positioning reviewed with pt Restrictions Weight Bearing Restrictions: Yes RLE Weight Bearing: Weight bearing as tolerated Other Position/Activity Restrictions: WBAT    Mobility  Bed Mobility Overal bed mobility: Needs Assistance Bed Mobility: Supine to Sit     Supine to sit: Supervision;HOB elevated     General bed mobility comments: supervision for safety; use of rail  Transfers Overall transfer level: Needs assistance Equipment used: Rolling walker (2 wheeled) Transfers: Sit to/from Stand Sit to Stand: From elevated surface;Min guard Stand pivot transfers: Min guard       General transfer comment: min guard for safety; cues for safe hand placement; several attempts to stand   Ambulation/Gait Ambulation/Gait assistance: Min guard Ambulation Distance (Feet): 80 Feet Assistive device: Rolling walker (2 wheeled) Gait Pattern/deviations: Step-through pattern;Antalgic;Decreased stance time - right;Decreased step length - left;Decreased weight shift to right Gait velocity: decreased   General Gait Details: cues for posture and sequencing   Stairs Stairs: Yes   Stair Management: Two rails;Step to pattern;Forwards Number of  Stairs: 2 General stair comments: cues for sequencing and technique  Wheelchair Mobility    Modified Rankin (Stroke Patients Only)       Balance Overall balance assessment: Needs assistance Sitting-balance support: No upper extremity supported;Feet supported Sitting balance-Leahy Scale: Good     Standing balance support: Bilateral upper extremity supported;During functional activity Standing balance-Leahy Scale: Poor Standing balance comment: reliant on RW in standing                            Cognition Arousal/Alertness: Awake/alert Behavior During Therapy: WFL for tasks assessed/performed Overall Cognitive Status: Within Functional Limits for tasks assessed                                        Exercises      General Comments        Pertinent Vitals/Pain Pain Assessment: Faces Pain Score: 6  Faces Pain Scale: Hurts even more Pain Location: right knee Pain Descriptors / Indicators: Aching;Sore;Grimacing;Guarding Pain Intervention(s): Limited activity within patient's tolerance;Monitored during session;Premedicated before session;Repositioned    Home Living                      Prior Function            PT Goals (current goals can now be found in the care plan section) Acute Rehab PT Goals PT Goal Formulation: With patient Time For Goal Achievement: 09/22/17 Potential to Achieve Goals: Good Progress towards PT goals: Progressing toward goals    Frequency    7X/week      PT Plan Current plan  remains appropriate    Co-evaluation              AM-PAC PT "6 Clicks" Daily Activity  Outcome Measure  Difficulty turning over in bed (including adjusting bedclothes, sheets and blankets)?: A Little Difficulty moving from lying on back to sitting on the side of the bed? : Unable Difficulty sitting down on and standing up from a chair with arms (e.g., wheelchair, bedside commode, etc,.)?: Unable Help needed moving  to and from a bed to chair (including a wheelchair)?: A Little Help needed walking in hospital room?: A Little Help needed climbing 3-5 steps with a railing? : A Little 6 Click Score: 14    End of Session Equipment Utilized During Treatment: Gait belt Activity Tolerance: Patient tolerated treatment well Patient left: in chair;with call bell/phone within reach Nurse Communication: Mobility status PT Visit Diagnosis: Unsteadiness on feet (R26.81);Other abnormalities of gait and mobility (R26.89);Repeated falls (R29.6);Muscle weakness (generalized) (M62.81);Pain Pain - Right/Left: Left     Time: 1040-1110 PT Time Calculation (min) (ACUTE ONLY): 30 min  Charges:  $Gait Training: 23-37 mins                    G Codes:       Earney Navy, PTA Pager: [phone]     Darliss Cheney 09/16/2017, 1:47 PM

## 2017-09-16 NOTE — Progress Notes (Signed)
   Subjective: 2 Days Post-Op Procedure(s) (LRB): RIGHT TOTAL KNEE ARTHROPLASTY (Right) Patient reports pain as moderate and severe.    Objective: Vital signs in last 24 hours: Temp:  [97.9 F (36.6 C)-98.6 F (37 C)] 98.6 F (37 C) (12/21 0500) Pulse Rate:  [89-93] 92 (12/21 0500) Resp:  [17-20] 17 (12/21 0500) BP: (118-130)/(72-74) 130/74 (12/21 0500) SpO2:  [95 %-98 %] 95 % (12/21 0500)  Intake/Output from previous day: 12/20 0701 - 12/21 0700 In: 560 [P.O.:560] Out: 1925 [Urine:1925] Intake/Output this shift: No intake/output data recorded.  Recent Labs    09/15/17 0708 09/16/17 0346  HGB 11.8* 11.9*   Recent Labs    09/15/17 0708 09/16/17 0346  WBC 13.1* 12.2*  RBC 3.88* 3.89*  HCT 35.4* 35.3*  PLT 252 233   Recent Labs    09/15/17 0708  NA 137  K 4.2  CL 105  CO2 26  BUN 20  CREATININE 1.02  GLUCOSE 119*  CALCIUM 8.5*   No results for input(s): LABPT, INR in the last 72 hours.  Neurologically intact No results found.  Assessment/Plan: 2 Days Post-Op Procedure(s) (LRB): RIGHT TOTAL KNEE ARTHROPLASTY (Right) Up with therapy , stairs today and home. Office 2 wks.   [name] 09/16/2017, 9:23 AM

## 2017-09-16 NOTE — Progress Notes (Signed)
Physical Therapy Treatment Patient Details Name: Taylor Schroeder MRN: [id number] DOB: 04/02/1966 Today's Date: 09/16/2017    History of Present Illness 51 yo male s/p RTKA. PMHx: LTKA    PT Comments    Patient c/o fatigue and pain however does continue to mobilize safely with supervision/min guard assist. Pt was able to complete first hal of HEP with cues and assistance. Wife present during session. Current plan remains appropriate.    Follow Up Recommendations  Home health PT;Supervision/Assistance - 24 hour;DC plan and follow up therapy as arranged by surgeon     Equipment Recommendations  Rolling walker with 5" wheels;3in1 (PT)    Recommendations for Other Services       Precautions / Restrictions Precautions Precautions: Fall;Knee Precaution Booklet Issued: Yes (comment) Precaution Comments: positioning reviewed with pt Restrictions Weight Bearing Restrictions: Yes RLE Weight Bearing: Weight bearing as tolerated Other Position/Activity Restrictions: WBAT    Mobility  Bed Mobility Overal bed mobility: Needs Assistance Bed Mobility: Sit to Supine     Supine to sit: Supervision;HOB elevated Sit to supine: Supervision   General bed mobility comments: supervision for safety  Transfers Overall transfer level: Needs assistance Equipment used: Rolling walker (2 wheeled) Transfers: Sit to/from Stand Sit to Stand: Supervision         General transfer comment: supervision for safety; safe hand placement demonstrated  Ambulation/Gait Ambulation/Gait assistance: Min guard Ambulation Distance (Feet): 100 Feet Assistive device: Rolling walker (2 wheeled) Gait Pattern/deviations: Step-through pattern;Antalgic;Decreased stance time - right;Decreased step length - left;Decreased weight shift to right Gait velocity: decreased   General Gait Details: cues for posture and sequencing   Stairs Stairs: Yes   Stair Management: Two rails;Step to pattern;Forwards Number  of Stairs: 2 General stair comments: cues for sequencing and technique  Wheelchair Mobility    Modified Rankin (Stroke Patients Only)       Balance Overall balance assessment: Needs assistance Sitting-balance support: No upper extremity supported;Feet supported Sitting balance-Leahy Scale: Good     Standing balance support: Bilateral upper extremity supported;During functional activity Standing balance-Leahy Scale: Poor Standing balance comment: reliant on RW in standing                            Cognition Arousal/Alertness: Awake/alert Behavior During Therapy: WFL for tasks assessed/performed Overall Cognitive Status: Within Functional Limits for tasks assessed                                        Exercises Total Joint Exercises Ankle Circles/Pumps: AROM;Both;10 reps Quad Sets: AROM;Right;10 reps Short Arc Quad: AROM;Right;10 reps Heel Slides: AAROM;Right;10 reps Goniometric ROM: approx 35 degrees flexion    General Comments        Pertinent Vitals/Pain Pain Assessment: Faces Faces Pain Scale: Hurts whole lot Pain Location: right knee Pain Descriptors / Indicators: Aching;Sore;Grimacing;Guarding Pain Intervention(s): Limited activity within patient's tolerance;Monitored during session;Repositioned;Patient requesting pain meds-RN notified;Premedicated before session;Ice applied    Home Living                      Prior Function            PT Goals (current goals can now be found in the care plan section) Acute Rehab PT Goals PT Goal Formulation: With patient Time For Goal Achievement: 09/22/17 Potential to Achieve Goals: Good Progress towards PT goals: Progressing  toward goals    Frequency    7X/week      PT Plan Current plan remains appropriate    Co-evaluation              AM-PAC PT "6 Clicks" Daily Activity  Outcome Measure  Difficulty turning over in bed (including adjusting bedclothes, sheets  and blankets)?: A Little Difficulty moving from lying on back to sitting on the side of the bed? : Unable Difficulty sitting down on and standing up from a chair with arms (e.g., wheelchair, bedside commode, etc,.)?: Unable Help needed moving to and from a bed to chair (including a wheelchair)?: A Little Help needed walking in hospital room?: A Little Help needed climbing 3-5 steps with a railing? : A Little 6 Click Score: 14    End of Session Equipment Utilized During Treatment: Gait belt Activity Tolerance: Patient limited by pain Patient left: with call bell/phone within reach;in bed;with family/visitor present Nurse Communication: Mobility status PT Visit Diagnosis: Unsteadiness on feet (R26.81);Other abnormalities of gait and mobility (R26.89);Repeated falls (R29.6);Muscle weakness (generalized) (M62.81);Pain Pain - Right/Left: Left     Time: 5462-7035 PT Time Calculation (min) (ACUTE ONLY): 33 min  Charges:  $Gait Training: 8-22 mins $Therapeutic Exercise: 8-22 mins                    G Codes:       Earney Navy, PTA Pager: [phone]     Darliss Cheney 09/16/2017, 4:09 PM

## 2017-09-16 NOTE — Progress Notes (Signed)
Occupational Therapy Treatment Patient Details Name: Taylor Schroeder MRN: [id number] DOB: [birth year] Today's Date: 09/16/2017    History of present illness 51 yo male s/p RTKA. PMHx: LTKA   OT comments  Pt making good progress with functional goals. Pt to dc home this afternoon  Follow Up Recommendations  No OT follow up;Supervision - Intermittent    Equipment Recommendations  3 in 1 bedside commode;Other (comment)(reacher)    Recommendations for Other Services      Precautions / Restrictions Precautions Precautions: Fall;Knee Precaution Comments: reviewed no pillow under knee  Restrictions Weight Bearing Restrictions: Yes RLE Weight Bearing: Weight bearing as tolerated Other Position/Activity Restrictions: WBAT       Mobility Bed Mobility               General bed mobility comments: pt up in recliner upon OT arrival  Transfers Overall transfer level: Needs assistance Equipment used: Rolling walker (2 wheeled) Transfers: Sit to/from Stand Sit to Stand: From elevated surface;Min guard Stand pivot transfers: Min guard       General transfer comment:  min guard to help power up into RW    Balance Overall balance assessment: Needs assistance Sitting-balance support: No upper extremity supported;Feet supported Sitting balance-Leahy Scale: Good     Standing balance support: Bilateral upper extremity supported;During functional activity Standing balance-Leahy Scale: Poor                             ADL either performed or assessed with clinical judgement   ADL Overall ADL's : Needs assistance/impaired                         Toilet Transfer: Min guard;Ambulation;RW Toilet Transfer Details (indicate cue type and reason): 3 in 1 over toilet Toileting- Clothing Manipulation and Hygiene: Minimal assistance;Sit to/from stand         General ADL Comments: reviewed dressing techniques to dress R LE first. pt's wife will be assiting  with ADLs and selfcare. educated pt ion using 3 in to transfer to tbu shower. Pt states that he will be able to sit on wide shower ledge to transfer into tub shower at home. Ot encouraged pt to await HH therapy to practice tub shower transefr without any DME     Vision Baseline Vision/History: Wears glasses Patient Visual Report: No change from baseline     Perception     Praxis      Cognition Arousal/Alertness: Awake/alert Behavior During Therapy: WFL for tasks assessed/performed Overall Cognitive Status: Within Functional Limits for tasks assessed                                          Exercises     Shoulder Instructions       General Comments  pt very pleasant and cooperative    Pertinent Vitals/ Pain       Pain Assessment: 0-10 Pain Score: 6  Pain Location: right knee Pain Descriptors / Indicators: Aching;Sore;Grimacing;Guarding Pain Intervention(s): Monitored during session;Premedicated before session;Repositioned  Home Living                                          Prior Functioning/Environment  Frequency  Min 2X/week        Progress Toward Goals  OT Goals(current goals can now be found in the care plan section)  Progress towards OT goals: Progressing toward goals  ADL Goals Pt Will Transfer to Toilet: with min guard assist;with supervision;ambulating Pt Will Perform Toileting - Clothing Manipulation and hygiene: with min guard assist;with supervision;with caregiver independent in assisting;sit to/from stand  Plan Discharge plan remains appropriate    Co-evaluation                 AM-PAC PT "6 Clicks" Daily Activity     Outcome Measure   Help from another person eating meals?: None Help from another person taking care of personal grooming?: A Little Help from another person toileting, which includes using toliet, bedpan, or urinal?: A Little Help from another person bathing (including  washing, rinsing, drying)?: A Little Help from another person to put on and taking off regular upper body clothing?: A Little Help from another person to put on and taking off regular lower body clothing?: A Lot 6 Click Score: 18    End of Session Equipment Utilized During Treatment: Gait belt;Rolling walker;Other (comment)(3 in 1)  OT Visit Diagnosis: Unsteadiness on feet (R26.81);Pain   Activity Tolerance Patient tolerated treatment well   Patient Left in chair;with call bell/phone within reach   Nurse Communication      Functional Assessment Tool Used: AM-PAC 6 Clicks Daily Activity   Time: 1106-1130 OT Time Calculation (min): 24 min  Charges: OT G-codes **NOT FOR INPATIENT CLASS** Functional Assessment Tool Used: AM-PAC 6 Clicks Daily Activity OT General Charges $OT Visit: 1 Visit OT Treatments $Self Care/Home Management : 8-22 mins $Therapeutic Activity: 8-22 mins     Britt Bottom 09/16/2017, 12:15 PM

## 2017-09-16 NOTE — Discharge Instructions (Signed)
OK to shower. Work on knee range of motion daily. Use walker. Office dr. Lorin Mercy 2 wks.  Call [phone] for appt.

## 2017-09-16 NOTE — Plan of Care (Signed)
  Education: Knowledge of General Education information will improve 09/16/2017 0555 - Progressing by Anson Fret, RN Note POC reviewed with pain; and pain management discussed.

## 2017-09-16 NOTE — Progress Notes (Signed)
Verified with Cardell Peach, [facility] PT referral complete, pt has DME at bedside and states that his ride will be here at 15:30. No other CM needs.

## 2017-09-16 NOTE — Progress Notes (Signed)
Patient has met discharge criteria at this time. Discharge instructions discussed with patient and family.  Patient and family verbalized understanding of instructions.  Paper prescriptions given to patient for following medications: Roxicet, Robaxin, Aspirin .  Patient instructed to call office to schedule follow up appointment with surgeon. Denies any further questions or concerns.  Patient discharged home with family via wheelchair.  Instructed to call office with any further questions or concerns.

## 2017-09-23 ENCOUNTER — Telehealth (INDEPENDENT_AMBULATORY_CARE_PROVIDER_SITE_OTHER): Payer: Self-pay | Admitting: Orthopaedic Surgery

## 2017-09-23 NOTE — Telephone Encounter (Signed)
Taylor Schroeder from [facility] called asking for verbal approval on 1 week 1, 3 week 1, and 2 week 1. CB # [phone] Can leave detailed message.

## 2017-09-23 NOTE — Telephone Encounter (Signed)
Ok for verbal orders ?

## 2017-09-23 NOTE — Discharge Summary (Signed)
Patient ID: Taylor Schroeder MRN: [id number] DOB/AGE: 52/18/1967 52 y.o.  Admit date: 09/14/2017 Discharge date: 09/23/2017  Admission Diagnoses:  Active Problems:   Arthritis of right knee   Discharge Diagnoses:  Active Problems:   Arthritis of right knee  status post Procedure(s): RIGHT TOTAL KNEE ARTHROPLASTY  Past Medical History:  Diagnosis Date  . Arthritis    "qwhere" (02/03/2017)  . BPH (benign prostatic hyperplasia)   . Childhood asthma    "as a baby"  . Coronary artery disease Non-obstructive   a. nonobs cath in 2006;  b. 01/2013 Cath: LM nl, LAD 34m, LCX nl, RCA non-dom, nl. EF 60-65%.  . Crohn's ileitis - working dx 06/01/2013   "a mild case of it"  . Esophageal ring, acquired 07/12/2013  . GERD (gastroesophageal reflux disease)   . H/O hiatal hernia    "think so" (02/06/2014)  . Headache    "resolved w/BP control in 11/2015" (02/03/2017)  . High cholesterol   . Hypertension   . Obesity   . Pernicious anemia-B12 deficiency 04/18/2013  . PONV (postoperative nausea and vomiting) 02/03/2017; 08/2016  . Sleep apnea     Surgeries: Procedure(s): RIGHT TOTAL KNEE ARTHROPLASTY on 09/14/2017   Consultants:   Discharged Condition: Improved  Hospital Course: Taylor Schroeder is an 51 y.o. male who was admitted 09/14/2017 for operative treatment of right knee arthritis. Patient failed conservative treatments (please see the history and physical for the specifics) and had severe unremitting pain that affects sleep, daily activities and work/hobbies. After pre-op clearance, the patient was taken to the operating room on 09/14/2017 and underwent  Procedure(s): RIGHT TOTAL KNEE ARTHROPLASTY.    Patient was given perioperative antibiotics:  Anti-infectives (From admission, onward)   Start     Dose/Rate Route Frequency Ordered Stop   09/14/17 2030  ceFAZolin (ANCEF) IVPB 1 g/50 mL premix     1 g 100 mL/hr over 30 Minutes Intravenous Every 8 hours 09/14/17 1718  09/15/17 0539   09/14/17 1200  ceFAZolin (ANCEF) IVPB 2g/100 mL premix  Status:  Discontinued     2 g 200 mL/hr over 30 Minutes Intravenous To [facility] 09/13/17 1032 09/13/17 1035   09/14/17 1200  ceFAZolin (ANCEF) 3 g in dextrose 5 % 50 mL IVPB     3 g 130 mL/hr over 30 Minutes Intravenous To ShortStay Surgical 09/13/17 1035 09/14/17 1336       Patient was given sequential compression devices and early ambulation to prevent DVT.   Patient benefited maximally from hospital stay and there were no complications. At the time of discharge, the patient was urinating/moving their bowels without difficulty, tolerating a regular diet, pain is controlled with oral pain medications and they have been cleared by PT/OT.   Recent vital signs: No data found.   Recent laboratory studies: No results for input(s): WBC, HGB, HCT, PLT, NA, K, CL, CO2, BUN, CREATININE, GLUCOSE, INR, CALCIUM in the last 72 hours.  Invalid input(s): PT, 2   Discharge Medications:   Allergies as of 09/16/2017      Reactions   Allegra [fexofenadine Hcl] Hives   Hctz [hydrochlorothiazide] Other (See Comments)   Weak and muscle cramps   Demerol [meperidine] Nausea And Vomiting   "Bad things" happen when I take it"      Medication List    STOP taking these medications   oxyCODONE 5 MG immediate release tablet Commonly known as:  Oxy IR/ROXICODONE     TAKE these medications  aspirin 325 MG EC tablet Take 1 tablet (325 mg total) by mouth daily with breakfast.   ibuprofen 200 MG tablet Commonly known as:  ADVIL,MOTRIN Take 4 tablets (800 mg total) by mouth 2 (two) times daily before a meal. What changed:    how much to take  when to take this   methocarbamol 500 MG tablet Commonly known as:  ROBAXIN Take 1 tablet (500 mg total) by mouth every 6 (six) hours as needed for muscle spasms.   oxyCODONE-acetaminophen 5-325 MG tablet Commonly known as:  ROXICET Take 1-2 tablets by mouth every 4 (four)  hours as needed for severe pain.   pantoprazole 40 MG tablet Commonly known as:  PROTONIX TAKE 1 TABLET (40 MG TOTAL) BY MOUTH DAILY BEFORE BREAKFAST.   rosuvastatin 10 MG tablet Commonly known as:  CRESTOR Take 1 tablet (10 mg total) by mouth daily.   telmisartan 80 MG tablet Commonly known as:  MICARDIS TAKE 1 TABLET BY MOUTH EVERY DAY       Diagnostic Studies: No results found.    Follow-up Information    [facility].   Specialty:  [place] Why:  A representative from [facility] will contact you to arrange start date and time for your therapy. Contact information: [address] [place] [address] [phone]        [name], MD Follow up in 2 week(s).   Specialty:  Orthopedic Surgery Contact information: [place] [address] [phone]           Discharge Plan:  discharge to home  Disposition:     Signed: Benjiman Core 0 09/23/2017, 11:24 AM

## 2017-09-25 NOTE — Telephone Encounter (Signed)
Call pt , ask where he did outpt PT previously. If he is ready for OPPT  Then refer, if he wants HHPT then OK  thanks

## 2017-09-28 ENCOUNTER — Ambulatory Visit (INDEPENDENT_AMBULATORY_CARE_PROVIDER_SITE_OTHER): Payer: BC Managed Care – PPO

## 2017-09-28 ENCOUNTER — Ambulatory Visit (INDEPENDENT_AMBULATORY_CARE_PROVIDER_SITE_OTHER): Payer: BC Managed Care – PPO | Admitting: Orthopaedic Surgery

## 2017-09-28 ENCOUNTER — Encounter (INDEPENDENT_AMBULATORY_CARE_PROVIDER_SITE_OTHER): Payer: Self-pay | Admitting: Orthopaedic Surgery

## 2017-09-28 DIAGNOSIS — Z96651 Presence of right artificial knee joint: Secondary | ICD-10-CM | POA: Diagnosis not present

## 2017-09-28 MED ORDER — OXYCODONE-ACETAMINOPHEN 5-325 MG PO TABS: ORAL_TABLET | ORAL | 0 refills | Status: DC

## 2017-09-28 NOTE — Progress Notes (Signed)
Post-Op Visit Note   Patient: Taylor Schroeder           Date of Birth: September 11, 1966           MRN: [id number] Visit Date: 09/28/2017 PCP: Janith Lima, MD   Assessment & Plan: Post right total knee arthroplasty.  Staples removed Percocet renewed number 30 tablets.  Outpatient physical therapy prescription given recheck 4 weeks.  Chief Complaint:  Chief Complaint  Patient presents with  . Right Knee - Routine Post Op   Visit Diagnoses:  1. Status post right knee replacement     Plan: Progress to outpatient therapy.  He already has full extension and can flex to 90 degrees.  Follow-Up Instructions: No Follow-up on file.   Orders:  Orders Placed This Encounter  Procedures  . XR Knee 1-2 Views Right   No orders of the defined types were placed in this encounter.   Imaging: Xr Knee 1-2 Views Right  Result Date: 09/28/2017 Standing AP both knees lateral x-ray right knee obtained.  This shows good position of bilateral total knee arthroplasties no evidence of loosening.  Staples are noted. Impression: Satisfactory right total knee arthroplasty without postoperative complications.   PMFS History: Patient Active Problem List   Diagnosis Date Noted  . Arthritis of right knee 09/14/2017  . 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 Sleep apnea 03/08/2017  . Hyperlipidemia with target LDL less than 100 12/22/2016  . Primary erectile dysfunction 11/18/2014  . Essential hypertension 07/15/2014  . DDD (degenerative disc disease), cervical 10/03/2013  . Esophageal ring, acquired 07/12/2013  . Crohn's ileitis - working dx 06/01/2013  . BPH (benign prostatic hyperplasia) 04/18/2013  . Pernicious anemia-B12 deficiency 04/18/2013  . Severe obesity (BMI >= 40) ([place]) 12/21/2012  . GERD (gastroesophageal reflux disease) 12/21/2012  . Hyperglycemia 08/29/2012  . Routine general medical examination at a health care facility 08/29/2012   Past Medical History:  Diagnosis  Date  . Arthritis    "qwhere" (02/03/2017)  . BPH (benign prostatic hyperplasia)   . Childhood asthma    "as a baby"  . Coronary artery disease Non-obstructive   a. nonobs cath in 2006;  b. 01/2013 Cath: LM nl, LAD 74m, LCX nl, RCA non-dom, nl. EF 60-65%.  . Crohn's ileitis - working dx 06/01/2013   "a mild case of it"  . Esophageal ring, acquired 07/12/2013  . GERD (gastroesophageal reflux disease)   . H/O hiatal hernia    "think so" (02/06/2014)  . Headache    "resolved w/BP control in 11/2015" (02/03/2017)  . High cholesterol   . Hypertension   . Obesity   . Pernicious anemia-B12 deficiency 04/18/2013  . PONV (postoperative nausea and vomiting) 02/03/2017; 08/2016  . Sleep apnea     Family History  Problem Relation Age of Onset  . Hypertension Mother   . Cancer Father        type unknown  . COPD Other   . Emphysema Maternal Grandmother   . Emphysema Maternal Grandfather   . Other Paternal Grandmother        spinal cancer  . Alcohol abuse Neg Hx   . Diabetes Neg Hx   . Early death Neg Hx   . Heart disease Neg Hx   . Hyperlipidemia Neg Hx   . Kidney disease Neg Hx   . Stroke Neg Hx   . Colon cancer Neg Hx   . Stomach cancer Neg Hx   . Esophageal cancer Neg Hx   .  Rectal cancer Neg Hx   . Liver cancer Neg Hx     Past Surgical History:  Procedure Laterality Date  . APPENDECTOMY    . CARDIAC CATHETERIZATION  01/2013   "results were clear"  . CARPAL TUNNEL RELEASE Right   . CHOLECYSTECTOMY N/A 02/03/2017   Procedure: LAPAROSCOPIC CHOLECYSTECTOMY;  Surgeon: Greer Pickerel, MD;  Location: [facility];  Service: General;  Laterality: N/A;  . COLONOSCOPY  ~ 2014  . ESOPHAGOGASTRODUODENOSCOPY  ~ 2014  . INGUINAL HERNIA REPAIR  1966   "? side"  . KNEE ARTHROPLASTY Left 02/06/2014   Procedure: COMPUTER ASSISTED TOTAL KNEE ARTHROPLASTY;  Surgeon: [name], MD;  Location: [facility];  Service: Orthopedics;  Laterality: Left;  Cemented Left Total Knee Arthroplasty  . KNEE ARTHROSCOPY  Left 08/2016   scar tissue cleaned out  . LAPAROSCOPIC CHOLECYSTECTOMY  02/03/2017  . LEFT HEART CATHETERIZATION WITH CORONARY ANGIOGRAM N/A 01/29/2013   Procedure: LEFT HEART CATHETERIZATION WITH CORONARY ANGIOGRAM;  Surgeon: Thayer Headings, MD;  Location: [facility] C[facility];  Service: Cardiovascular;  Laterality: N/A;  . MULTIPLE TOOTH EXTRACTIONS  09/2016  . SHOULDER ARTHROSCOPY Left 03/12/2013  . TOTAL KNEE ARTHROPLASTY Left 02/06/2014  . TOTAL KNEE ARTHROPLASTY Right 09/14/2017  . TOTAL KNEE ARTHROPLASTY Right 09/14/2017   Procedure: RIGHT TOTAL KNEE ARTHROPLASTY;  Surgeon: [name], MD;  Location: [facility];  Service: Orthopedics;  Laterality: Right;   Social History   Occupational History  . Occupation: MAINTENANCE    Employer: [employer]  Tobacco Use  . Smoking status: Former Smoker    Packs/day: 2.00    Years: 30.00    Pack years: 60.00    Types: Cigarettes    Last attempt to quit: 08/29/2008    Years since quitting: 9.0  . Smokeless tobacco: Never Used  Substance and Sexual Activity  . Alcohol use: No    Comment: 02/03/2017 "maybe 12 pack of beer all at one time but only once/year"; 02/06/2014 "couple beers a couple times/month"  . Drug use: No  . Sexual activity: Yes

## 2017-09-28 NOTE — Addendum Note (Signed)
Addended byLaurann Montana on: 09/28/2017 12:38 PM   Modules accepted: Orders

## 2017-09-28 NOTE — Telephone Encounter (Signed)
IC patient and he has appt today, tomorrow is planned d/c from [facility], he will discuss and get OP PT order from [facility] at today's appt.

## 2017-09-28 NOTE — Addendum Note (Signed)
Addended by: Laurann Montana on: 09/28/2017 12:39 PM   Modules accepted: Orders

## 2017-10-04 ENCOUNTER — Ambulatory Visit: Payer: BC Managed Care – PPO | Attending: Orthopaedic Surgery | Admitting: Physical Therapy

## 2017-10-04 ENCOUNTER — Encounter: Payer: Self-pay | Admitting: Physical Therapy

## 2017-10-04 DIAGNOSIS — R2689 Other abnormalities of gait and mobility: Secondary | ICD-10-CM | POA: Diagnosis present

## 2017-10-04 DIAGNOSIS — M25661 Stiffness of right knee, not elsewhere classified: Secondary | ICD-10-CM | POA: Insufficient documentation

## 2017-10-04 DIAGNOSIS — M25561 Pain in right knee: Secondary | ICD-10-CM | POA: Diagnosis not present

## 2017-10-04 NOTE — Therapy (Signed)
[place], [place], [address] Phone: [phone]   Fax:  [phone]  Physical Therapy Evaluation  Patient Details  Name: Taylor Schroeder MRN: [id number] Date of Birth: 02-Oct-1965 Referring Provider: Dr. Rodell Perna   Encounter Date: 10/04/2017  PT End of Session - 10/04/17 1351    Visit Number  1    Number of Visits  16    Date for PT Re-Evaluation  11/29/17    Authorization Type  BCBS    PT Start Time  1315    PT Stop Time  1403    PT Time Calculation (min)  48 min    Activity Tolerance  Patient limited by pain;Patient tolerated treatment well    Behavior During Therapy  [facility] for tasks assessed/performed       Past Medical History:  Diagnosis Date  . Arthritis    "qwhere" (02/03/2017)  . BPH (benign prostatic hyperplasia)   . Childhood asthma    "as a baby"  . Coronary artery disease Non-obstructive   a. nonobs cath in 2006;  b. 01/2013 Cath: LM nl, LAD 90m, LCX nl, RCA non-dom, nl. EF 60-65%.  . Crohn's ileitis - working dx 06/01/2013   "a mild case of it"  . Esophageal ring, acquired 07/12/2013  . GERD (gastroesophageal reflux disease)   . H/O hiatal hernia    "think so" (02/06/2014)  . Headache    "resolved w/BP control in 11/2015" (02/03/2017)  . High cholesterol   . Hypertension   . Obesity   . Pernicious anemia-B12 deficiency 04/18/2013  . PONV (postoperative nausea and vomiting) 02/03/2017; 08/2016  . Sleep apnea     Past Surgical History:  Procedure Laterality Date  . APPENDECTOMY    . CARDIAC CATHETERIZATION  01/2013   "results were clear"  . CARPAL TUNNEL RELEASE Right   . CHOLECYSTECTOMY N/A 02/03/2017   Procedure: LAPAROSCOPIC CHOLECYSTECTOMY;  Surgeon: Greer Pickerel, MD;  Location: [place];  Service: General;  Laterality: N/A;  . COLONOSCOPY  ~ 2014  . ESOPHAGOGASTRODUODENOSCOPY  ~ 2014  . INGUINAL HERNIA REPAIR  1966   "? side"  . KNEE ARTHROPLASTY Left 02/06/2014   Procedure:  COMPUTER ASSISTED TOTAL KNEE ARTHROPLASTY;  Surgeon: [name], MD;  Location: [place];  Service: Orthopedics;  Laterality: Left;  Cemented Left Total Knee Arthroplasty  . KNEE ARTHROSCOPY Left 08/2016   scar tissue cleaned out  . LAPAROSCOPIC CHOLECYSTECTOMY  02/03/2017  . LEFT HEART CATHETERIZATION WITH CORONARY ANGIOGRAM N/A 01/29/2013   Procedure: LEFT HEART CATHETERIZATION WITH CORONARY ANGIOGRAM;  Surgeon: Thayer Headings, MD;  Location: [facility] CATH LAB;  Service: Cardiovascular;  Laterality: N/A;  . MULTIPLE TOOTH EXTRACTIONS  09/2016  . SHOULDER ARTHROSCOPY Left 03/12/2013  . TOTAL KNEE ARTHROPLASTY Left 02/06/2014  . TOTAL KNEE ARTHROPLASTY Right 09/14/2017  . TOTAL KNEE ARTHROPLASTY Right 09/14/2017   Procedure: RIGHT TOTAL KNEE ARTHROPLASTY;  Surgeon: [name], MD;  Location: [place];  Service: Orthopedics;  Laterality: Right;    There were no vitals filed for this visit.   Subjective Assessment - 10/04/17 1317    Subjective  Pt is a 52 y/o male s/p Rt TKA on 09/14/17.  Pt presents today with continued c/o pain and ROM and strength deficits.  Pt had HHPT until last week, and is now transisitoning to OPPT.     Limitations  Standing;Walking;House hold activities    How long can you stand comfortably?  5 min    How  long can you walk comfortably?  20 min    Patient Stated Goals  improve pain, motion and strength    Currently in Pain?  Yes    Pain Score  5     Pain Location  Knee    Pain Orientation  Right    Pain Descriptors / Indicators  Aching;Burning;Sharp    Pain Type  Surgical pain;Acute pain    Pain Onset  1 to 4 weeks ago    Pain Frequency  Constant    Aggravating Factors   walking, standing, bending, contact with medial knee    Pain Relieving Factors  aspercreme, medication         [facility] PT Assessment - 10/04/17 1322      Assessment   Medical Diagnosis  Rt TKA    Referring Provider  Dr. Rodell Perna    Onset Date/Surgical Date  09/14/17    Next MD Visit  10/26/17     Prior Therapy  HHPT      Precautions   Precautions  None      Restrictions   Weight Bearing Restrictions  No      Balance Screen   Has the patient fallen in the past 6 months  Yes    How many times?  1-prior to surgery    Has the patient had a decrease in activity level because of a fear of falling?   No    Is the patient reluctant to leave their home because of a fear of falling?   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    Leisure  Geographical information systems officer   Overall Cognitive Status  Within Functional Limits for tasks assessed      Observation/Other Assessments   Skin Integrity  pitting edema noted in RLE distal to knee; swelling and edema still present    Focus on Therapeutic Outcomes (FOTO)   37 (63% limited; predicted 44% limited)      ROM / Strength   AROM / PROM / Strength  AROM;Strength;PROM      AROM   AROM Assessment Site  Knee    Right/Left Knee  Right;Left    Right Knee Extension  0    Right Knee Flexion  98    Left Knee Extension  0    Left Knee Flexion  125      PROM   PROM Assessment Site  Knee    Right/Left Knee  Right    Right Knee Flexion  112      Strength   Strength Assessment Site  Hip;Knee;Ankle    Right/Left Hip  Right;Left    Right Hip Flexion  4/5    Right Hip Extension  3/5    Right Hip ABduction  4/5  Left Hip Flexion  5/5    Left Hip Extension  5/5    Left Hip  ABduction  5/5    Right/Left Knee  Right;Left    Right Knee Flexion  3-/5    Right Knee Extension  3+/5    Left Knee Flexion  5/5    Left Knee Extension  5/5    Right/Left Ankle  Right;Left    Right Ankle Dorsiflexion  4/5    Left Ankle Dorsiflexion  5/5      Palpation   Palpation comment  pain medial joint line Rt knee; quad tightness present      Ambulation/Gait   Ambulation/Gait  Yes    Ambulation/Gait Assistance  5: Supervision    Ambulation Distance (Feet)  100 Feet    Assistive device  Straight cane    Gait Pattern  Step-through pattern;Decreased stance time - right;Decreased step length - left;Decreased hip/knee flexion - right;Antalgic    Ambulation Surface  Level;Indoor             Objective measurements completed on examination: See above findings.      [facility] Adult PT Treatment/Exercise - 10/04/17 1322      Modalities   Modalities  Vasopneumatic      Vasopneumatic   Number Minutes Vasopneumatic   15 minutes    Vasopnuematic Location   Knee    Vasopneumatic Pressure  Medium    Vasopneumatic Temperature   max cold      Manual Therapy   Manual Therapy  Passive ROM    Passive ROM  knee flexion in prone with end range holds             PT Education - 10/04/17 1351    Education provided  Yes    Education Details  bring HEP from HHPT to review/update PRN    Person(s) Educated  Patient    Methods  Explanation    Comprehension  Verbalized understanding       PT Short Term Goals - 10/04/17 1355      PT SHORT TERM GOAL #1   Title  independent with initial HEP    Status  New    Target Date  11/01/17      PT SHORT TERM GOAL #2   Title  improve Rt knee AROM 0-110 for improved function and mobility    Status  New    Target Date  11/01/17        PT Long Term Goals - 10/04/17 1355      PT LONG TERM GOAL #1   Title  independent with advanced HEP    Status  New    Target Date  11/29/17      PT LONG TERM GOAL #2   Title  amb > 500' and  negotiate stairs without device or gait deviations    Status  New    Target Date  11/29/17      PT LONG TERM GOAL #3   Title  improve Rt knee AROM 0-120 for improved function and mobility    Status  New    Target Date  11/29/17      PT LONG TERM GOAL #4   Title  perform work simulate activities including kneeling and crawling (with knee pads) without increase in pain in preparation for return to work    Status  New    Target Date  11/29/17      PT LONG TERM GOAL #5   Title  report pain <  3/10 with ambulation and standing activities for improved activity tolerance    Status  New    Target Date  11/29/17             Plan - 10/04/17 1352    Clinical Impression Statement  Pt is a 52 y/o male who presents to OPPT s/p Rt TKA on 09/14/17.  Pt demonstrates flexion ROM limitations as well as decreased strength and continued pain affecting functional mobility and return to work.  Pt will benefit from PT to address deficits listed.    Clinical Presentation  Stable    Clinical Decision Making  Low    Rehab Potential  Good    PT Frequency  2x / week    PT Duration  8 weeks    PT Treatment/Interventions  ADLs/Self Care Home Management;Cryotherapy;Electrical Stimulation;Moist Heat;Therapeutic exercise;Therapeutic activities;Functional mobility training;Stair training;Gait training;DME Instruction;Balance training;Neuromuscular re-education;Patient/family education;Manual techniques;Vasopneumatic Device;Taping;Dry needling;Passive range of motion;Scar mobilization    PT Next Visit Plan  review HEP from HHPT and update PRN, manual for flexion and make sure pt maintains extension; RLE strengthening; modalities PRN for pain and swelling    Consulted and Agree with Plan of Care  Patient       Patient will benefit from skilled therapeutic intervention in order to improve the following deficits and impairments:  Abnormal gait, Decreased balance, Decreased range of motion, Impaired flexibility,  Difficulty walking, Decreased mobility, Increased fascial restricitons, Increased muscle spasms, Decreased strength, Increased edema  Visit Diagnosis: Acute pain of right knee - Plan: PT plan of care cert/re-cert  Stiffness of right knee, not elsewhere classified - Plan: PT plan of care cert/re-cert  Other abnormalities of gait and mobility - Plan: PT plan of care cert/re-cert     Problem List Patient Active Problem List   Diagnosis Date Noted  . Arthritis of right knee 09/14/2017  . 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 Sleep apnea 03/08/2017  . Hyperlipidemia with target LDL less than 100 12/22/2016  . Primary erectile dysfunction 11/18/2014  . Essential hypertension 07/15/2014  . DDD (degenerative disc disease), cervical 10/03/2013  . Esophageal ring, acquired 07/12/2013  . Crohn's ileitis - working dx 06/01/2013  . BPH (benign prostatic hyperplasia) 04/18/2013  . Pernicious anemia-B12 deficiency 04/18/2013  . Severe obesity (BMI >= 40) ([place]) 12/21/2012  . GERD (gastroesophageal reflux disease) 12/21/2012  . Hyperglycemia 08/29/2012  . Routine general medical examination at a health care facility 08/29/2012      Laureen Abrahams, PT, DPT 10/04/17 1:59 PM    [place], [place], [address] Phone: [phone]   Fax:  [phone]  Name: Taylor Schroeder MRN: [id number] Date of Birth: [birth year]

## 2017-10-11 ENCOUNTER — Encounter: Payer: Self-pay | Admitting: Physical Therapy

## 2017-10-11 ENCOUNTER — Ambulatory Visit: Payer: BC Managed Care – PPO | Admitting: Physical Therapy

## 2017-10-11 DIAGNOSIS — R2689 Other abnormalities of gait and mobility: Secondary | ICD-10-CM

## 2017-10-11 DIAGNOSIS — M25561 Pain in right knee: Secondary | ICD-10-CM | POA: Diagnosis not present

## 2017-10-11 DIAGNOSIS — M25661 Stiffness of right knee, not elsewhere classified: Secondary | ICD-10-CM

## 2017-10-11 NOTE — Therapy (Signed)
[place], [place], [address] Phone: [phone]   Fax:  [phone]  Physical Therapy Treatment  Patient Details  Name: Taylor Schroeder MRN: [id number] Date of Birth: [birth year] Referring Provider: Dr. Rodell Perna   Encounter Date: 10/11/2017  PT End of Session - 10/11/17 0922    Visit Number  2    Number of Visits  16    Date for PT Re-Evaluation  11/29/17    Authorization Type  BCBS    PT Start Time  [id number]    PT Stop Time  0935    PT Time Calculation (min)  51 min    Activity Tolerance  Patient tolerated treatment well    Behavior During Therapy  [facility] for tasks assessed/performed       Past Medical History:  Diagnosis Date  . Arthritis    "qwhere" (02/03/2017)  . BPH (benign prostatic hyperplasia)   . Childhood asthma    "as a baby"  . Coronary artery disease Non-obstructive   a. nonobs cath in 2006;  b. 01/2013 Cath: LM nl, LAD 48m, LCX nl, RCA non-dom, nl. EF 60-65%.  . Crohn's ileitis - working dx 06/01/2013   "a mild case of it"  . Esophageal ring, acquired 07/12/2013  . GERD (gastroesophageal reflux disease)   . H/O hiatal hernia    "think so" (02/06/2014)  . Headache    "resolved w/BP control in 11/2015" (02/03/2017)  . High cholesterol   . Hypertension   . Obesity   . Pernicious anemia-B12 deficiency 04/18/2013  . PONV (postoperative nausea and vomiting) 02/03/2017; 08/2016  . Sleep apnea     Past Surgical History:  Procedure Laterality Date  . APPENDECTOMY    . CARDIAC CATHETERIZATION  01/2013   "results were clear"  . CARPAL TUNNEL RELEASE Right   . CHOLECYSTECTOMY N/A 02/03/2017   Procedure: LAPAROSCOPIC CHOLECYSTECTOMY;  Surgeon: Greer Pickerel, MD;  Location: [place];  Service: General;  Laterality: N/A;  . COLONOSCOPY  ~ 2014  . ESOPHAGOGASTRODUODENOSCOPY  ~ 2014  . INGUINAL HERNIA REPAIR  1966   "? side"  . KNEE ARTHROPLASTY Left 02/06/2014   Procedure: COMPUTER ASSISTED TOTAL  KNEE ARTHROPLASTY;  Surgeon: [name], MD;  Location: [place];  Service: Orthopedics;  Laterality: Left;  Cemented Left Total Knee Arthroplasty  . KNEE ARTHROSCOPY Left 08/2016   scar tissue cleaned out  . LAPAROSCOPIC CHOLECYSTECTOMY  02/03/2017  . LEFT HEART CATHETERIZATION WITH CORONARY ANGIOGRAM N/A 01/29/2013   Procedure: LEFT HEART CATHETERIZATION WITH CORONARY ANGIOGRAM;  Surgeon: Thayer Headings, MD;  Location: [facility] CATH LAB;  Service: Cardiovascular;  Laterality: N/A;  . MULTIPLE TOOTH EXTRACTIONS  09/2016  . SHOULDER ARTHROSCOPY Left 03/12/2013  . TOTAL KNEE ARTHROPLASTY Left 02/06/2014  . TOTAL KNEE ARTHROPLASTY Right 09/14/2017  . TOTAL KNEE ARTHROPLASTY Right 09/14/2017   Procedure: RIGHT TOTAL KNEE ARTHROPLASTY;  Surgeon: [name], MD;  Location: [place];  Service: Orthopedics;  Laterality: Right;    There were no vitals filed for this visit.  Subjective Assessment - 10/11/17 0844    Subjective  reports last night was a bad night; still having pain in anterior tibia but reports area is getting smaller.    Limitations  Standing;Walking;House hold activities    Patient Stated Goals  improve pain, motion and strength    Currently in Pain?  Yes    Pain Score  4     Pain Location  Knee    Pain  Orientation  Right    Pain Descriptors / Indicators  Aching;Burning;Sharp    Pain Type  Surgical pain;Acute pain    Pain Onset  1 to 4 weeks ago    Aggravating Factors   walking, standing, bending, contact with medial knee    Pain Relieving Factors  aspercreme, medication                      [facility] Adult PT Treatment/Exercise - 10/11/17 0853      Exercises   Exercises  Knee/Hip      Knee/Hip Exercises: Aerobic   Recumbent Bike  x 8 min for ROM      Knee/Hip Exercises: Supine   Quad Sets  Right;10 reps    Quad Sets Limitations  min cues for quad activation    [employer]  Right;10 reps    Straight Leg Raises  Right;10 reps    Straight Leg Raises  Limitations  cues to decrease extensor lag      Knee/Hip Exercises: Prone   Other Prone Exercises  AA knee flexion with strap 10 x 5 sec hold      Vasopneumatic   Number Minutes Vasopneumatic   15 minutes    Vasopnuematic Location   Knee    Vasopneumatic Pressure  Low    Vasopneumatic Temperature   max cold      Manual Therapy   Manual Therapy  Soft tissue mobilization;Passive ROM    Soft tissue mobilization  IASTM Rt quads; ant tib and gastroc/soleus    Passive ROM  knee flexion in prone with end range holds               PT Short Term Goals - 10/04/17 1355      PT SHORT TERM GOAL #1   Title  independent with initial HEP    Status  New    Target Date  11/01/17      PT SHORT TERM GOAL #2   Title  improve Rt knee AROM 0-110 for improved function and mobility    Status  New    Target Date  11/01/17        PT Long Term Goals - 10/04/17 1355      PT LONG TERM GOAL #1   Title  independent with advanced HEP    Status  New    Target Date  11/29/17      PT LONG TERM GOAL #2   Title  amb > 500' and negotiate stairs without device or gait deviations    Status  New    Target Date  11/29/17      PT LONG TERM GOAL #3   Title  improve Rt knee AROM 0-120 for improved function and mobility    Status  New    Target Date  11/29/17      PT LONG TERM GOAL #4   Title  perform work simulate activities including kneeling and crawling (with knee pads) without increase in pain in preparation for return to work    Status  New    Target Date  11/29/17      PT LONG TERM GOAL #5   Title  report pain < 3/10 with ambulation and standing activities for improved activity tolerance    Status  New    Target Date  11/29/17            Plan - 10/11/17 0924    Clinical Impression Statement  Pt tolerated  session well today with improved quad control with cues for feedback.  Pt progressing well with PT.    PT Treatment/Interventions  ADLs/Self Care Home  Management;Cryotherapy;Electrical Stimulation;Moist Heat;Therapeutic exercise;Therapeutic activities;Functional mobility training;Stair training;Gait training;DME Instruction;Balance training;Neuromuscular re-education;Patient/family education;Manual techniques;Vasopneumatic Device;Taping;Dry needling;Passive range of motion;Scar mobilization    PT Next Visit Plan  manual for flexion and make sure pt maintains extension; RLE strengthening; modalities PRN for pain and swelling    Consulted and Agree with Plan of Care  Patient       Patient will benefit from skilled therapeutic intervention in order to improve the following deficits and impairments:  Abnormal gait, Decreased balance, Decreased range of motion, Impaired flexibility, Difficulty walking, Decreased mobility, Increased fascial restricitons, Increased muscle spasms, Decreased strength, Increased edema  Visit Diagnosis: Acute pain of right knee  Stiffness of right knee, not elsewhere classified  Other abnormalities of gait and mobility     Problem List Patient Active Problem List   Diagnosis Date Noted  . Arthritis of right knee 09/14/2017  . 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 Sleep apnea 03/08/2017  . Hyperlipidemia with target LDL less than 100 12/22/2016  . Primary erectile dysfunction 11/18/2014  . Essential hypertension 07/15/2014  . DDD (degenerative disc disease), cervical 10/03/2013  . Esophageal ring, acquired 07/12/2013  . Crohn's ileitis - working dx 06/01/2013  . BPH (benign prostatic hyperplasia) 04/18/2013  . Pernicious anemia-B12 deficiency 04/18/2013  . Severe obesity (BMI >= 40) ([place]) 12/21/2012  . GERD (gastroesophageal reflux disease) 12/21/2012  . Hyperglycemia 08/29/2012  . Routine general medical examination at a health care facility 08/29/2012      Laureen Abrahams, PT, DPT 10/11/17 9:26 AM    [place], [place], [address] Phone: [phone]   Fax:  [phone]  Name: Taylor Schroeder MRN: [id number] Date of Birth: Feb 22, 1966

## 2017-10-13 ENCOUNTER — Encounter: Payer: Self-pay | Admitting: Physical Therapy

## 2017-10-13 ENCOUNTER — Ambulatory Visit: Payer: BC Managed Care – PPO | Admitting: Physical Therapy

## 2017-10-13 DIAGNOSIS — M25561 Pain in right knee: Secondary | ICD-10-CM | POA: Diagnosis not present

## 2017-10-13 DIAGNOSIS — M25661 Stiffness of right knee, not elsewhere classified: Secondary | ICD-10-CM

## 2017-10-13 DIAGNOSIS — R2689 Other abnormalities of gait and mobility: Secondary | ICD-10-CM

## 2017-10-13 NOTE — Therapy (Signed)
[place], [place], [address] Phone: [phone]   Fax:  [phone]  Physical Therapy Treatment  Patient Details  Name: Taylor Schroeder MRN: [id number] Date of Birth: Mar 12, 1966 Referring Provider: Dr. Rodell Perna   Encounter Date: 10/13/2017  PT End of Session - 10/13/17 0805    Visit Number  3    Number of Visits  16    Date for PT Re-Evaluation  11/29/17    Authorization Type  BCBS    PT Start Time  0800    PT Stop Time  0850    PT Time Calculation (min)  50 min       Past Medical History:  Diagnosis Date  . Arthritis    "qwhere" (02/03/2017)  . BPH (benign prostatic hyperplasia)   . Childhood asthma    "as a baby"  . Coronary artery disease Non-obstructive   a. nonobs cath in 2006;  b. 01/2013 Cath: LM nl, LAD 32m, LCX nl, RCA non-dom, nl. EF 60-65%.  . Crohn's ileitis - working dx 06/01/2013   "a mild case of it"  . Esophageal ring, acquired 07/12/2013  . GERD (gastroesophageal reflux disease)   . H/O hiatal hernia    "think so" (02/06/2014)  . Headache    "resolved w/BP control in 11/2015" (02/03/2017)  . High cholesterol   . Hypertension   . Obesity   . Pernicious anemia-B12 deficiency 04/18/2013  . PONV (postoperative nausea and vomiting) 02/03/2017; 08/2016  . Sleep apnea     Past Surgical History:  Procedure Laterality Date  . APPENDECTOMY    . CARDIAC CATHETERIZATION  01/2013   "results were clear"  . CARPAL TUNNEL RELEASE Right   . CHOLECYSTECTOMY N/A 02/03/2017   Procedure: LAPAROSCOPIC CHOLECYSTECTOMY;  Surgeon: Greer Pickerel, MD;  Location: [facility];  Service: General;  Laterality: N/A;  . COLONOSCOPY  ~ 2014  . ESOPHAGOGASTRODUODENOSCOPY  ~ 2014  . INGUINAL HERNIA REPAIR  1966   "? side"  . KNEE ARTHROPLASTY Left 02/06/2014   Procedure: COMPUTER ASSISTED TOTAL KNEE ARTHROPLASTY;  Surgeon: [name], MD;  Location: [facility];  Service: Orthopedics;  Laterality: Left;  Cemented  Left Total Knee Arthroplasty  . KNEE ARTHROSCOPY Left 08/2016   scar tissue cleaned out  . LAPAROSCOPIC CHOLECYSTECTOMY  02/03/2017  . LEFT HEART CATHETERIZATION WITH CORONARY ANGIOGRAM N/A 01/29/2013   Procedure: LEFT HEART CATHETERIZATION WITH CORONARY ANGIOGRAM;  Surgeon: Thayer Headings, MD;  Location[facility] C[facility] LAB;  Service: Cardiovascular;  Laterality: N/A;  . MULTIPLE TOOTH EXTRACTIONS  09/2016  . SHOULDER ARTHROSCOPY Left 03/12/2013  . TOTAL KNEE ARTHROPLASTY Left 02/06/2014  . TOTAL KNEE ARTHROPLASTY Right 09/14/2017  . TOTAL KNEE ARTHROPLASTY Right 09/14/2017   Procedure: RIGHT TOTAL KNEE ARTHROPLASTY;  Surgeon: [name], MD;  Location: [facility];  Service: Orthopedics;  Laterality: Right;    There were no vitals filed for this visit.  Subjective Assessment - 10/13/17 0804    Subjective  Slept 5 hours straight, groggy this morning.     Currently in Pain?  Yes    Pain Score  4     Pain Location  Knee    Pain Orientation  Right;Medial    Pain Descriptors / Indicators  Aching;Burning                      [facility] Adult PT Treatment/Exercise - 10/13/17 0001      Knee/Hip Exercises: Stretches   Press photographer  3 reps;30 seconds runners     Soleus Stretch  -- couldnt feel stretch       Knee/Hip Exercises: Aerobic   Recumbent Bike  x 8 min for ROM      Knee/Hip Exercises: Standing   Heel Raises  10 reps    Wall Squat  10 reps 1/4 Rom      Knee/Hip Exercises: Seated   Long Arc Quad  10 reps      Knee/Hip Exercises: Supine   Quad Sets  Right;15 reps    Quad Sets Limitations  min cues    Short Arc Quad Sets  Right;10 reps    Straight Leg Raises  Right;10 reps    Straight Leg Raises Limitations  cues to decrease extensor lag      Knee/Hip Exercises: Sidelying   Hip ABduction  10 reps      Knee/Hip Exercises: Prone   Hamstring Curl  10 reps    Hip Extension  10 reps    Other Prone Exercises  AA knee flexion with strap 10 x 5 sec hold      Vasopneumatic    Number Minutes Vasopneumatic   15 minutes    Vasopnuematic Location   Knee    Vasopneumatic Pressure  Low    Vasopneumatic Temperature   34               PT Short Term Goals - 10/04/17 1355      PT SHORT TERM GOAL #1   Title  independent with initial HEP    Status  New    Target Date  11/01/17      PT SHORT TERM GOAL #2   Title  improve Rt knee AROM 0-110 for improved function and mobility    Status  New    Target Date  11/01/17        PT Long Term Goals - 10/04/17 1355      PT LONG TERM GOAL #1   Title  independent with advanced HEP    Status  New    Target Date  11/29/17      PT LONG TERM GOAL #2   Title  amb > 500' and negotiate stairs without device or gait deviations    Status  New    Target Date  11/29/17      PT LONG TERM GOAL #3   Title  improve Rt knee AROM 0-120 for improved function and mobility    Status  New    Target Date  11/29/17      PT LONG TERM GOAL #4   Title  perform work simulate activities including kneeling and crawling (with knee pads) without increase in pain in preparation for return to work    Status  New    Target Date  11/29/17      PT LONG TERM GOAL #5   Title  report pain < 3/10 with ambulation and standing activities for improved activity tolerance    Status  New    Target Date  11/29/17            Plan - 10/13/17 0842    Clinical Impression Statement  Min quad lag present today. Began small wall slides. Instructed in standing calf stretch. Vaso at end of session. Pt requested min pressure.     PT Next Visit Plan  manual for flexion and make sure pt maintains extension; RLE strengthening; modalities PRN for pain and swelling       Patient  will benefit from skilled therapeutic intervention in order to improve the following deficits and impairments:  Abnormal gait, Decreased balance, Decreased range of motion, Impaired flexibility, Difficulty walking, Decreased mobility, Increased fascial restricitons, Increased  muscle spasms, Decreased strength, Increased edema  Visit Diagnosis: Acute pain of right knee  Other abnormalities of gait and mobility  Stiffness of right knee, not elsewhere classified     Problem List Patient Active Problem List   Diagnosis Date Noted  . Arthritis of right knee 09/14/2017  . 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 Sleep apnea 03/08/2017  . Hyperlipidemia with target LDL less than 100 12/22/2016  . Primary erectile dysfunction 11/18/2014  . Essential hypertension 07/15/2014  . DDD (degenerative disc disease), cervical 10/03/2013  . Esophageal ring, acquired 07/12/2013  . Crohn's ileitis - working dx 06/01/2013  . BPH (benign prostatic hyperplasia) 04/18/2013  . Pernicious anemia-B12 deficiency 04/18/2013  . Severe obesity (BMI >= 40) ([place]) 12/21/2012  . GERD (gastroesophageal reflux disease) 12/21/2012  . Hyperglycemia 08/29/2012  . Routine general medical examination at a health care facility 08/29/2012    Dorene Ar, [place] 10/13/2017, 8:44 AM  [place], [place], [address] Phone: [phone]   Fax:  [phone]  Name: HARDEEP REETZ MRN: [id number] Date of Birth: 1966/01/27

## 2017-10-18 ENCOUNTER — Ambulatory Visit: Payer: BC Managed Care – PPO | Admitting: Physical Therapy

## 2017-10-18 ENCOUNTER — Encounter: Payer: Self-pay | Admitting: Physical Therapy

## 2017-10-18 DIAGNOSIS — M25661 Stiffness of right knee, not elsewhere classified: Secondary | ICD-10-CM

## 2017-10-18 DIAGNOSIS — R2689 Other abnormalities of gait and mobility: Secondary | ICD-10-CM

## 2017-10-18 DIAGNOSIS — M25561 Pain in right knee: Secondary | ICD-10-CM | POA: Diagnosis not present

## 2017-10-18 NOTE — Therapy (Signed)
[place], [place], [address] Phone: [phone]   Fax:  [phone]  Physical Therapy Treatment  Patient Details  Name: Taylor Schroeder MRN: [id number] Date of Birth: [birth year] Referring Provider: Dr. Rodell Perna   Encounter Date: 10/18/2017  PT End of Session - 10/18/17 0924    Visit Number  4    Number of Visits  16    Date for PT Re-Evaluation  11/29/17    Authorization Type  BCBS    PT Start Time  0843    PT Stop Time  0939    PT Time Calculation (min)  56 min    Activity Tolerance  Patient tolerated treatment well    Behavior During Therapy  [facility] for tasks assessed/performed       Past Medical History:  Diagnosis Date  . Arthritis    "qwhere" (02/03/2017)  . BPH (benign prostatic hyperplasia)   . Childhood asthma    "as a baby"  . Coronary artery disease Non-obstructive   a. nonobs cath in 2006;  b. 01/2013 Cath: LM nl, LAD 61m, LCX nl, RCA non-dom, nl. EF 60-65%.  . Crohn's ileitis - working dx 06/01/2013   "a mild case of it"  . Esophageal ring, acquired 07/12/2013  . GERD (gastroesophageal reflux disease)   . H/O hiatal hernia    "think so" (02/06/2014)  . Headache    "resolved w/BP control in 11/2015" (02/03/2017)  . High cholesterol   . Hypertension   . Obesity   . Pernicious anemia-B12 deficiency 04/18/2013  . PONV (postoperative nausea and vomiting) 02/03/2017; 08/2016  . Sleep apnea     Past Surgical History:  Procedure Laterality Date  . APPENDECTOMY    . CARDIAC CATHETERIZATION  01/2013   "results were clear"  . CARPAL TUNNEL RELEASE Right   . CHOLECYSTECTOMY N/A 02/03/2017   Procedure: LAPAROSCOPIC CHOLECYSTECTOMY;  Surgeon: Greer Pickerel, MD;  Location: [place];  Service: General;  Laterality: N/A;  . COLONOSCOPY  ~ 2014  . ESOPHAGOGASTRODUODENOSCOPY  ~ 2014  . INGUINAL HERNIA REPAIR  1966   "? side"  . KNEE ARTHROPLASTY Left 02/06/2014   Procedure: COMPUTER ASSISTED TOTAL  KNEE ARTHROPLASTY;  Surgeon: [name], MD;  Location: [facility];  Service: Orthopedics;  Laterality: Left;  Cemented Left Total Knee Arthroplasty  . KNEE ARTHROSCOPY Left 08/2016   scar tissue cleaned out  . LAPAROSCOPIC CHOLECYSTECTOMY  02/03/2017  . LEFT HEART CATHETERIZATION WITH CORONARY ANGIOGRAM N/A 01/29/2013   Procedure: LEFT HEART CATHETERIZATION WITH CORONARY ANGIOGRAM;  Surgeon: Thayer Headings, MD;  [facility] CATH LAB;  Service: Cardiovascular;  Laterality: N/A;  . MULTIPLE TOOTH EXTRACTIONS  09/2016  . SHOULDER ARTHROSCOPY Left 03/12/2013  . TOTAL KNEE ARTHROPLASTY Left 02/06/2014  . TOTAL KNEE ARTHROPLASTY Right 09/14/2017  . TOTAL KNEE ARTHROPLASTY Right 09/14/2017   Procedure: RIGHT TOTAL KNEE ARTHROPLASTY;  Surgeon: [name], MD;  Location: [place];  Service: Orthopedics;  Laterality: Right;    There were no vitals filed for this visit.  Subjective Assessment - 10/18/17 0847    Subjective  got new shoes and feels like he's adjusting to walking differently; having some back and hip soreness due to the new shoes but not bad.    Patient Stated Goals  improve pain, motion and strength    Currently in Pain?  No/denies  [place] Adult PT Treatment/Exercise - 10/18/17 0849      Knee/Hip Exercises: Stretches   Gastroc Stretch  3 reps;30 seconds off step      Knee/Hip Exercises: Aerobic   Recumbent Bike  x 8 min for ROM      Knee/Hip Exercises: Machines for Strengthening   Cybex Knee Extension  5# 2x10; RLE mainly with LLE assist    Cybex Knee Flexion  10# 2x10; using RLE with LLE assist      Knee/Hip Exercises: Supine   Straight Leg Raises  Right;10 reps    Straight Leg Raises Limitations  cues to decrease extensor lag      Knee/Hip Exercises: Prone   Other Prone Exercises  AA knee flexion with strap 10 x 5 sec hold      Vasopneumatic   Number Minutes Vasopneumatic   15 minutes    Vasopnuematic Location   Knee    Vasopneumatic  Pressure  Low    Vasopneumatic Temperature   34      Manual Therapy   Soft tissue mobilization  IASTM to Rt gastroc; instructed in use of tennis ball for self mobilization               PT Short Term Goals - 10/04/17 1355      PT SHORT TERM GOAL #1   Title  independent with initial HEP    Status  New    Target Date  11/01/17      PT SHORT TERM GOAL #2   Title  improve Rt knee AROM 0-110 for improved function and mobility    Status  New    Target Date  11/01/17        PT Long Term Goals - 10/04/17 1355      PT LONG TERM GOAL #1   Title  independent with advanced HEP    Status  New    Target Date  11/29/17      PT LONG TERM GOAL #2   Title  amb > 500' and negotiate stairs without device or gait deviations    Status  New    Target Date  11/29/17      PT LONG TERM GOAL #3   Title  improve Rt knee AROM 0-120 for improved function and mobility    Status  New    Target Date  11/29/17      PT LONG TERM GOAL #4   Title  perform work simulate activities including kneeling and crawling (with knee pads) without increase in pain in preparation for return to work    Status  New    Target Date  11/29/17      PT LONG TERM GOAL #5   Title  report pain < 3/10 with ambulation and standing activities for improved activity tolerance    Status  New    Target Date  11/29/17            Plan - 10/18/17 0924    Clinical Impression Statement  Pt progressing well with PT; continues to c/o Rt calf pain so manual performed today and modified home stretch to better suit pt.  Initiated gym strengthening exercises today.    PT Treatment/Interventions  ADLs/Self Care Home Management;Cryotherapy;Electrical Stimulation;Moist Heat;Therapeutic exercise;Therapeutic activities;Functional mobility training;Stair training;Gait training;DME Instruction;Balance training;Neuromuscular re-education;Patient/family education;Manual techniques;Vasopneumatic Device;Taping;Dry needling;Passive range  of motion;Scar mobilization    PT Next Visit Plan  manual for flexion and make sure pt maintains extension; RLE strengthening; modalities PRN for pain and  swelling; measure ROM    Consulted and Agree with Plan of Care  Patient       Patient will benefit from skilled therapeutic intervention in order to improve the following deficits and impairments:  Abnormal gait, Decreased balance, Decreased range of motion, Impaired flexibility, Difficulty walking, Decreased mobility, Increased fascial restricitons, Increased muscle spasms, Decreased strength, Increased edema  Visit Diagnosis: Acute pain of right knee  Other abnormalities of gait and mobility  Stiffness of right knee, not elsewhere classified     Problem List Patient Active Problem List   Diagnosis Date Noted  . Arthritis of right knee 09/14/2017  . 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 Sleep apnea 03/08/2017  . Hyperlipidemia with target LDL less than 100 12/22/2016  . Primary erectile dysfunction 11/18/2014  . Essential hypertension 07/15/2014  . DDD (degenerative disc disease), cervical 10/03/2013  . Esophageal ring, acquired 07/12/2013  . Crohn's ileitis - working dx 06/01/2013  . BPH (benign prostatic hyperplasia) 04/18/2013  . Pernicious anemia-B12 deficiency 04/18/2013  . Severe obesity (BMI >= 40) ([place]) 12/21/2012  . GERD (gastroesophageal reflux disease) 12/21/2012  . Hyperglycemia 08/29/2012  . Routine general medical examination at a health care facility 08/29/2012      Laureen Abrahams, PT, DPT 10/18/17 9:26 AM    [place], [place], [address] Phone: [phone]   Fax:  [phone]  Name: THANOS COUSINEAU MRN: [id number] Date of Birth: 1966/04/16

## 2017-10-20 ENCOUNTER — Ambulatory Visit: Payer: BC Managed Care – PPO | Admitting: Physical Therapy

## 2017-10-20 DIAGNOSIS — R2689 Other abnormalities of gait and mobility: Secondary | ICD-10-CM

## 2017-10-20 DIAGNOSIS — M25561 Pain in right knee: Secondary | ICD-10-CM

## 2017-10-20 DIAGNOSIS — M25661 Stiffness of right knee, not elsewhere classified: Secondary | ICD-10-CM

## 2017-10-20 NOTE — Therapy (Signed)
[place], [place], [address] Phone: [phone]   Fax:  [phone]  Physical Therapy Treatment  Patient Details  Name: Taylor Schroeder MRN: [id number] Date of Birth: 16-Apr-1966 Referring Provider: Dr. Rodell Perna   Encounter Date: 10/20/2017  PT End of Session - 10/20/17 0851    Visit Number  5    Number of Visits  16    Date for PT Re-Evaluation  11/29/17    Authorization Type  BCBS    PT Start Time  0846    PT Stop Time  0945    PT Time Calculation (min)  59 min       Past Medical History:  Diagnosis Date  . Arthritis    "qwhere" (02/03/2017)  . BPH (benign prostatic hyperplasia)   . Childhood asthma    "as a baby"  . Coronary artery disease Non-obstructive   a. nonobs cath in 2006;  b. 01/2013 Cath: LM nl, LAD 10m, LCX nl, RCA non-dom, nl. EF 60-65%.  . Crohn's ileitis - working dx 06/01/2013   "a mild case of it"  . Esophageal ring, acquired 07/12/2013  . GERD (gastroesophageal reflux disease)   . H/O hiatal hernia    "think so" (02/06/2014)  . Headache    "resolved w/BP control in 11/2015" (02/03/2017)  . High cholesterol   . Hypertension   . Obesity   . Pernicious anemia-B12 deficiency 04/18/2013  . PONV (postoperative nausea and vomiting) 02/03/2017; 08/2016  . Sleep apnea     Past Surgical History:  Procedure Laterality Date  . APPENDECTOMY    . CARDIAC CATHETERIZATION  01/2013   "results were clear"  . CARPAL TUNNEL RELEASE Right   . CHOLECYSTECTOMY N/A 02/03/2017   Procedure: LAPAROSCOPIC CHOLECYSTECTOMY;  Surgeon: Greer Pickerel, MD;  Location: [facility];  Service: General;  Laterality: N/A;  . COLONOSCOPY  ~ 2014  . ESOPHAGOGASTRODUODENOSCOPY  ~ 2014  . INGUINAL HERNIA REPAIR  1966   "? side"  . KNEE ARTHROPLASTY Left 02/06/2014   Procedure: COMPUTER ASSISTED TOTAL KNEE ARTHROPLASTY;  Surgeon: [name], MD;  Location: [facility];  Service: Orthopedics;  Laterality: Left;  Cemented  Left Total Knee Arthroplasty  . KNEE ARTHROSCOPY Left 08/2016   scar tissue cleaned out  . LAPAROSCOPIC CHOLECYSTECTOMY  02/03/2017  . LEFT HEART CATHETERIZATION WITH CORONARY ANGIOGRAM N/A 01/29/2013   Procedure: LEFT HEART CATHETERIZATION WITH CORONARY ANGIOGRAM;  Surgeon: Thayer Headings, MD;  Location: [facility] CATH LAB;  Service: Cardiovascular;  Laterality: N/A;  . MULTIPLE TOOTH EXTRACTIONS  09/2016  . SHOULDER ARTHROSCOPY Left 03/12/2013  . TOTAL KNEE ARTHROPLASTY Left 02/06/2014  . TOTAL KNEE ARTHROPLASTY Right 09/14/2017  . TOTAL KNEE ARTHROPLASTY Right 09/14/2017   Procedure: RIGHT TOTAL KNEE ARTHROPLASTY;  Surgeon: [name], MD;  Location: [place];  Service: Orthopedics;  Laterality: Right;    There were no vitals filed for this visit.  Subjective Assessment - 10/20/17 0916    Subjective  Pain in calf and hip with walking, Trying to adjust to having a straight leg now.     Currently in Pain?  Yes    Pain Score  2     Pain Location  Knee    Pain Orientation  Right;Medial    Pain Descriptors / Indicators  Aching;Burning    Aggravating Factors   prolonged walking,  [place] Adult PT Treatment/Exercise - 10/20/17 0001      Knee/Hip Exercises: Stretches   Hip Flexor Stretch  1 rep;60 seconds    Gastroc Stretch  3 reps;30 seconds off step      Knee/Hip Exercises: Aerobic   Recumbent Bike  x 8 min for ROM L1 for last 5 minutes       Knee/Hip Exercises: Machines for Strengthening   Cybex Knee Extension  5# 2x10; RLE mainly with LLE assist    Cybex Knee Flexion  10# 2x10; using RLE with LLE assist    Cybex Leg Press  1 plate x 20       Knee/Hip Exercises: Standing   Forward Step Up  10 reps;Step Height: 6";Hand Hold: 1      Knee/Hip Exercises: Supine   Quad Sets  Right;10 reps    Quad Sets Limitations  10 sec holds     Straight Leg Raises  Right;10 reps    Straight Leg Raises Limitations  cues to decrease extensor lag      Knee/Hip  Exercises: Sidelying   Hip ABduction  10 reps      Knee/Hip Exercises: Prone   Hamstring Curl  10 reps    Hip Extension  10 reps      Vasopneumatic   Number Minutes Vasopneumatic   15 minutes    Vasopnuematic Location   Knee    Vasopneumatic Pressure  Low    Vasopneumatic Temperature   34               PT Short Term Goals - 10/04/17 1355      PT SHORT TERM GOAL #1   Title  independent with initial HEP    Status  New    Target Date  11/01/17      PT SHORT TERM GOAL #2   Title  improve Rt knee AROM 0-110 for improved function and mobility    Status  New    Target Date  11/01/17        PT Long Term Goals - 10/04/17 1355      PT LONG TERM GOAL #1   Title  independent with advanced HEP    Status  New    Target Date  11/29/17      PT LONG TERM GOAL #2   Title  amb > 500' and negotiate stairs without device or gait deviations    Status  New    Target Date  11/29/17      PT LONG TERM GOAL #3   Title  improve Rt knee AROM 0-120 for improved function and mobility    Status  New    Target Date  11/29/17      PT LONG TERM GOAL #4   Title  perform work simulate activities including kneeling and crawling (with knee pads) without increase in pain in preparation for return to work    Status  New    Target Date  11/29/17      PT LONG TERM GOAL #5   Title  report pain < 3/10 with ambulation and standing activities for improved activity tolerance    Status  New    Target Date  11/29/17            Plan - 10/20/17 0915    Clinical Impression Statement  Pt reports calf feels better today. He only stretched calf yesterday, no other HEP. Repeated gym machines and added LEg press. BEgan 6 inch step up. Tolerated well.  PT Next Visit Plan  manual for flexion and make sure pt maintains extension; RLE strengthening; modalities PRN for pain and swelling; step ups , measure ROM    Consulted and Agree with Plan of Care  Patient       Patient will benefit from  skilled therapeutic intervention in order to improve the following deficits and impairments:  Abnormal gait, Decreased balance, Decreased range of motion, Impaired flexibility, Difficulty walking, Decreased mobility, Increased fascial restricitons, Increased muscle spasms, Decreased strength, Increased edema  Visit Diagnosis: Acute pain of right knee  Other abnormalities of gait and mobility  Stiffness of right knee, not elsewhere classified     Problem List Patient Active Problem List   Diagnosis Date Noted  . Arthritis of right knee 09/14/2017  . 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 Sleep apnea 03/08/2017  . Hyperlipidemia with target LDL less than 100 12/22/2016  . Primary erectile dysfunction 11/18/2014  . Essential hypertension 07/15/2014  . DDD (degenerative disc disease), cervical 10/03/2013  . Esophageal ring, acquired 07/12/2013  . Crohn's ileitis - working dx 06/01/2013  . BPH (benign prostatic hyperplasia) 04/18/2013  . Pernicious anemia-B12 deficiency 04/18/2013  . Severe obesity (BMI >= 40) ([place]) 12/21/2012  . GERD (gastroesophageal reflux disease) 12/21/2012  . Hyperglycemia 08/29/2012  . Routine general medical examination at a health care facility 08/29/2012    Taylor Schroeder, [place] 10/20/2017, 9:42 AM  [place], [place], [address] Phone: [phone]   Fax:  [phone]  Name: Taylor Schroeder MRN: [id number] Date of Birth: [birth year]

## 2017-10-24 ENCOUNTER — Encounter: Payer: Self-pay | Admitting: Physical Therapy

## 2017-10-24 ENCOUNTER — Ambulatory Visit: Payer: BC Managed Care – PPO | Admitting: Physical Therapy

## 2017-10-24 ENCOUNTER — Ambulatory Visit: Payer: BC Managed Care – PPO

## 2017-10-24 DIAGNOSIS — M25561 Pain in right knee: Secondary | ICD-10-CM

## 2017-10-24 DIAGNOSIS — R2689 Other abnormalities of gait and mobility: Secondary | ICD-10-CM

## 2017-10-24 DIAGNOSIS — M25661 Stiffness of right knee, not elsewhere classified: Secondary | ICD-10-CM

## 2017-10-24 NOTE — Therapy (Signed)
[place], [place], [address] Phone: [phone]   Fax:  [phone]  Physical Therapy Treatment  Patient Details  Name: Taylor Schroeder MRN: [id number] Date of Birth: Oct 10, 1965 Referring Provider: Dr. Rodell Perna   Encounter Date: 10/24/2017  PT End of Session - 10/24/17 0839    Visit Number  6    Number of Visits  16    Date for PT Re-Evaluation  11/29/17    Authorization Type  BCBS    PT Start Time  0800    PT Stop Time  0855    PT Time Calculation (min)  55 min    Activity Tolerance  Patient tolerated treatment well    Behavior During Therapy  [facility] for tasks assessed/performed       Past Medical History:  Diagnosis Date  . Arthritis    "qwhere" (02/03/2017)  . BPH (benign prostatic hyperplasia)   . Childhood asthma    "as a baby"  . Coronary artery disease Non-obstructive   a. nonobs cath in 2006;  b. 01/2013 Cath: LM nl, LAD 59m LCX nl, RCA non-dom, nl. EF 60-65%.  . Crohn's ileitis - working dx 06/01/2013   "a mild case of it"  . Esophageal ring, acquired 07/12/2013  . GERD (gastroesophageal reflux disease)   . H/O hiatal hernia    "think so" (02/06/2014)  . Headache    "resolved w/BP control in 11/2015" (02/03/2017)  . High cholesterol   . Hypertension   . Obesity   . Pernicious anemia-B12 deficiency 04/18/2013  . PONV (postoperative nausea and vomiting) 02/03/2017; 08/2016  . Sleep apnea     Past Surgical History:  Procedure Laterality Date  . APPENDECTOMY    . CARDIAC CATHETERIZATION  01/2013   "results were clear"  . CARPAL TUNNEL RELEASE Right   . CHOLECYSTECTOMY N/A 02/03/2017   Procedure: LAPAROSCOPIC CHOLECYSTECTOMY;  Surgeon: WGreer Pickerel MD;  [facility]  Service: General;  Laterality: N/A;  . COLONOSCOPY  ~ 2014  . ESOPHAGOGASTRODUODENOSCOPY  ~ 2014  . INGUINAL HERNIA REPAIR  1966   "? side"  . KNEE ARTHROPLASTY Left 02/06/2014   Procedure: COMPUTER ASSISTED TOTAL  KNEE ARTHROPLASTY;  Surgeon: MMarybelle Killings MD;  [facility] [facility]  Service: Orthopedics;  Laterality: Left;  Cemented Left Total Knee Arthroplasty  . KNEE ARTHROSCOPY Left 08/2016   scar tissue cleaned out  . LAPAROSCOPIC CHOLECYSTECTOMY  02/03/2017  . LEFT HEART CATHETERIZATION WITH CORONARY ANGIOGRAM N/A 01/29/2013   Procedure: LEFT HEART CATHETERIZATION WITH CORONARY ANGIOGRAM;  Surgeon: PThayer Headings MD;  [facility];  Service: Cardiovascular;  Laterality: N/A;  . MULTIPLE TOOTH EXTRACTIONS  09/2016  . SHOULDER ARTHROSCOPY Left 03/12/2013  . TOTAL KNEE ARTHROPLASTY Left 02/06/2014  . TOTAL KNEE ARTHROPLASTY Right 09/14/2017  . TOTAL KNEE ARTHROPLASTY Right 09/14/2017   Procedure: RIGHT TOTAL KNEE ARTHROPLASTY;  Surgeon: YMarybelle Killings MD;  [place] [facility]  Service: Orthopedics;  Laterality: Right;    There were no vitals filed for this visit.  Subjective Assessment - 10/24/17 0803    Subjective  reports having some trouble sleeping; not sure if it's pain or "habit," didn't take pain medication last night    Patient Stated Goals  improve pain, motion and strength    Currently in Pain?  Yes    Pain Score  4     Pain Location  Knee    Pain Orientation  Right;Medial    Pain Descriptors /  Indicators  Aching;Burning    Pain Type  Acute pain;Surgical pain    Pain Onset  More than a month ago    Pain Frequency  Constant    Aggravating Factors   prolonged walking    Pain Relieving Factors  aspercreme, medication         [facility] PT Assessment - 10/24/17 0823      AROM   Right Knee Extension  0    Right Knee Flexion  122                  [facility] Adult PT Treatment/Exercise - 10/24/17 0804      Knee/Hip Exercises: Stretches   Gastroc Stretch  3 reps;30 seconds      Knee/Hip Exercises: Aerobic   Recumbent Bike  x 8 min for ROM      Knee/Hip Exercises: Machines for Strengthening   Cybex Knee Extension  5# 2x10 RLE only    Cybex Knee Flexion  10# 2x10 RLE only     Total Gym Leg Press  20# 2x10; RLE only      Knee/Hip Exercises: Standing   Terminal Knee Extension  Right;10 reps;Theraband    Theraband Level (Terminal Knee Extension)  Level 4 (Blue)    Lateral Step Up  Right;10 reps;Hand Hold: 1;Step Height: 6"    Forward Step Up  10 reps;Step Height: 6";Hand Hold: 1      Knee/Hip Exercises: Supine   Quad Sets  Right;10 reps    Quad Sets Limitations  10 sec hold    Heel Slides  AAROM;Right;10 reps with strap    Straight Leg Raises  Right;10 reps    Straight Leg Raises Limitations  improving extensor lag               PT Short Term Goals - 10/24/17 0840      PT SHORT TERM GOAL #1   Title  independent with initial HEP    Status  Achieved      PT SHORT TERM GOAL #2   Title  improve Rt knee AROM 0-110 for improved function and mobility    Status  Achieved        PT Long Term Goals - 10/24/17 0840      PT LONG TERM GOAL #1   Title  independent with advanced HEP    Status  On-going    Target Date  11/29/17      PT LONG TERM GOAL #2   Title  amb > 500' and negotiate stairs without device or gait deviations    Status  On-going      PT LONG TERM GOAL #3   Title  improve Rt knee AROM 0-120 for improved function and mobility    Status  Achieved      PT LONG TERM GOAL #4   Title  perform work simulate activities including kneeling and crawling (with knee pads) without increase in pain in preparation for return to work    Status  On-going      PT LONG TERM GOAL #5   Title  report pain < 3/10 with ambulation and standing activities for improved activity tolerance    Status  On-going            Plan - 10/24/17 0840    Clinical Impression Statement  Pt with improvements in ROM and has met ROM STG and LTG.  Will plan to maintain ROM while focusing on strengthening.      PT Treatment/Interventions  ADLs/Self Care Home Management;Cryotherapy;Electrical Stimulation;Moist Heat;Therapeutic exercise;Therapeutic  activities;Functional mobility training;Stair training;Gait training;DME Instruction;Balance training;Neuromuscular re-education;Patient/family education;Manual techniques;Vasopneumatic Device;Taping;Dry needling;Passive range of motion;Scar mobilization    PT Next Visit Plan  maintain ROM; RLE strengthening; modalities PRN for pain and swelling    Consulted and Agree with Plan of Care  Patient       Patient will benefit from skilled therapeutic intervention in order to improve the following deficits and impairments:  Abnormal gait, Decreased balance, Decreased range of motion, Impaired flexibility, Difficulty walking, Decreased mobility, Increased fascial restricitons, Increased muscle spasms, Decreased strength, Increased edema  Visit Diagnosis: Acute pain of right knee  Other abnormalities of gait and mobility  Stiffness of right knee, not elsewhere classified     Problem List Patient Active Problem List   Diagnosis Date Noted  . Arthritis of right knee 09/14/2017  . 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 Sleep apnea 03/08/2017  . Hyperlipidemia with target LDL less than 100 12/22/2016  . Primary erectile dysfunction 11/18/2014  . Essential hypertension 07/15/2014  . DDD (degenerative disc disease), cervical 10/03/2013  . Esophageal ring, acquired 07/12/2013  . Crohn's ileitis - working dx 06/01/2013  . BPH (benign prostatic hyperplasia) 04/18/2013  . Pernicious anemia-B12 deficiency 04/18/2013  . Severe obesity (BMI >= 40) ([place]) 12/21/2012  . GERD (gastroesophageal reflux disease) 12/21/2012  . Hyperglycemia 08/29/2012  . Routine general medical examination at a health care facility 08/29/2012      Laureen Abrahams, PT, DPT 10/24/17 8:42 AM    [facility] [address] [place], [place], [address] Phone: [phone]   Fax:  [phone]  Name: Taylor Schroeder MRN: [id number] Date of Birth:  Jun 08, 1966

## 2017-10-26 ENCOUNTER — Ambulatory Visit (INDEPENDENT_AMBULATORY_CARE_PROVIDER_SITE_OTHER): Payer: BC Managed Care – PPO | Admitting: Orthopaedic Surgery

## 2017-10-26 ENCOUNTER — Encounter (INDEPENDENT_AMBULATORY_CARE_PROVIDER_SITE_OTHER): Payer: Self-pay | Admitting: Orthopaedic Surgery

## 2017-10-26 VITALS — BP 136/93 | HR 85

## 2017-10-26 DIAGNOSIS — Z96653 Presence of artificial knee joint, bilateral: Secondary | ICD-10-CM

## 2017-10-26 NOTE — Progress Notes (Signed)
Post-Op Visit Note   Patient: Taylor Schroeder           Date of Birth: December 09, 1965           MRN: [id number] Visit Date: 10/26/2017 PCP: Janith Lima, MD   Assessment & Plan: s/p   Bilateral TKA   Chief Complaint:  Chief Complaint  Patient presents with  . Right Knee - Routine Post Op   Visit Diagnoses:  1. S/P total knee arthroplasty, bilateral     Plan: Patient still has quad weakness is flexion extension is good.  He has problems with shoulder osteoarthritis on the left and has problems with the shoulder with his quad weakness having to push himself up.  He is continue to work on weight loss.  Work slip has been given which covers him until March sixth 2019.  I plan to recheck him again in 1 month.  Follow-Up Instructions: No Follow-up on file.   Orders:  No orders of the defined types were placed in this encounter.  No orders of the defined types were placed in this encounter.   Imaging: No results found.  PMFS History: Patient Active Problem List   Diagnosis Date Noted  . Arthritis of right knee 09/14/2017  . 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 Sleep apnea 03/08/2017  . Hyperlipidemia with target LDL less than 100 12/22/2016  . Primary erectile dysfunction 11/18/2014  . Essential hypertension 07/15/2014  . DDD (degenerative disc disease), cervical 10/03/2013  . Esophageal ring, acquired 07/12/2013  . Crohn's ileitis - working dx 06/01/2013  . BPH (benign prostatic hyperplasia) 04/18/2013  . Pernicious anemia-B12 deficiency 04/18/2013  . Severe obesity (BMI >= 40) ([place]) 12/21/2012  . GERD (gastroesophageal reflux disease) 12/21/2012  . Hyperglycemia 08/29/2012  . Routine general medical examination at a health care facility 08/29/2012   Past Medical History:  Diagnosis Date  . Arthritis    "qwhere" (02/03/2017)  . BPH (benign prostatic hyperplasia)   . Childhood asthma    "as a baby"  . Coronary artery disease Non-obstructive   a.  nonobs cath in 2006;  b. 01/2013 Cath: LM nl, LAD 7m, LCX nl, RCA non-dom, nl. EF 60-65%.  . Crohn's ileitis - working dx 06/01/2013   "a mild case of it"  . Esophageal ring, acquired 07/12/2013  . GERD (gastroesophageal reflux disease)   . H/O hiatal hernia    "think so" (02/06/2014)  . Headache    "resolved w/BP control in 11/2015" (02/03/2017)  . High cholesterol   . Hypertension   . Obesity   . Pernicious anemia-B12 deficiency 04/18/2013  . PONV (postoperative nausea and vomiting) 02/03/2017; 08/2016  . Sleep apnea     Family History  Problem Relation Age of Onset  . Hypertension Mother   . Cancer Father        type unknown  . COPD Other   . Emphysema Maternal Grandmother   . Emphysema Maternal Grandfather   . Other Paternal Grandmother        spinal cancer  . Alcohol abuse Neg Hx   . Diabetes Neg Hx   . Early death Neg Hx   . Heart disease Neg Hx   . Hyperlipidemia Neg Hx   . Kidney disease Neg Hx   . Stroke Neg Hx   . Colon cancer Neg Hx   . Stomach cancer Neg Hx   . Esophageal cancer Neg Hx   . Rectal cancer Neg Hx   . Liver cancer  Neg Hx     Past Surgical History:  Procedure Laterality Date  . APPENDECTOMY    . CARDIAC CATHETERIZATION  01/2013   "results were clear"  . CARPAL TUNNEL RELEASE Right   . CHOLECYSTECTOMY N/A 02/03/2017   Procedure: LAPAROSCOPIC CHOLECYSTECTOMY;  Surgeon: Greer Pickerel, MD;  Location: [facility];  Service: General;  Laterality: N/A;  . COLONOSCOPY  ~ 2014  . ESOPHAGOGASTRODUODENOSCOPY  ~ 2014  . INGUINAL HERNIA REPAIR  1966   "? side"  . KNEE ARTHROPLASTY Left 02/06/2014   Procedure: COMPUTER ASSISTED TOTAL KNEE ARTHROPLASTY;  Surgeon: [name], MD;  Location: [facility];  Service: Orthopedics;  Laterality: Left;  Cemented Left Total Knee Arthroplasty  . KNEE ARTHROSCOPY Left 08/2016   scar tissue cleaned out  . LAPAROSCOPIC CHOLECYSTECTOMY  02/03/2017  . LEFT HEART CATHETERIZATION WITH CORONARY ANGIOGRAM N/A 01/29/2013   Procedure: LEFT  HEART CATHETERIZATION WITH CORONARY ANGIOGRAM;  Surgeon: Thayer Headings, MD;  Location: [facility] CATH LAB;  Service: Cardiovascular;  Laterality: N/A;  . MULTIPLE TOOTH EXTRACTIONS  09/2016  . SHOULDER ARTHROSCOPY Left 03/12/2013  . TOTAL KNEE ARTHROPLASTY Left 02/06/2014  . TOTAL KNEE ARTHROPLASTY Right 09/14/2017  . TOTAL KNEE ARTHROPLASTY Right 09/14/2017   Procedure: RIGHT TOTAL KNEE ARTHROPLASTY;  Surgeon: [name], MD;  Location: [facility];  Service: Orthopedics;  Laterality: Right;   Social History   Occupational History  . Occupation: MAINTENANCE    Employer: [employer]  Tobacco Use  . Smoking status: Former Smoker    Packs/day: 2.00    Years: 30.00    Pack years: 60.00    Types: Cigarettes    Last attempt to quit: 08/29/2008    Years since quitting: 9.1  . Smokeless tobacco: Never Used  Substance and Sexual Activity  . Alcohol use: No    Comment: 02/03/2017 "maybe 12 pack of beer all at one time but only once/year"; 02/06/2014 "couple beers a couple times/month"  . Drug use: No  . Sexual activity: Yes

## 2017-10-27 ENCOUNTER — Encounter: Payer: Self-pay | Admitting: Physical Therapy

## 2017-10-27 ENCOUNTER — Ambulatory Visit: Payer: BC Managed Care – PPO | Admitting: Physical Therapy

## 2017-10-27 DIAGNOSIS — M25661 Stiffness of right knee, not elsewhere classified: Secondary | ICD-10-CM

## 2017-10-27 DIAGNOSIS — M25561 Pain in right knee: Secondary | ICD-10-CM | POA: Diagnosis not present

## 2017-10-27 DIAGNOSIS — R2689 Other abnormalities of gait and mobility: Secondary | ICD-10-CM

## 2017-10-27 NOTE — Therapy (Signed)
[place], [place], [address] Phone: [phone]   Fax:  [phone]  Physical Therapy Treatment  Patient Details  Name: Taylor Schroeder MRN: [id number] Date of Birth: Feb 09, 1966 Referring Provider: Dr. Rodell Perna   Encounter Date: 10/27/2017  PT End of Session - 10/27/17 0842    Visit Number  7    Number of Visits  16    Date for PT Re-Evaluation  11/29/17    Authorization Type  BCBS    PT Start Time  0800    PT Stop Time  0856    PT Time Calculation (min)  56 min    Activity Tolerance  Patient tolerated treatment well       Past Medical History:  Diagnosis Date  . Arthritis    "qwhere" (02/03/2017)  . BPH (benign prostatic hyperplasia)   . Childhood asthma    "as a baby"  . Coronary artery disease Non-obstructive   a. nonobs cath in 2006;  b. 01/2013 Cath: LM nl, LAD 47m, LCX nl, RCA non-dom, nl. EF 60-65%.  . Crohn's ileitis - working dx 06/01/2013   "a mild case of it"  . Esophageal ring, acquired 07/12/2013  . GERD (gastroesophageal reflux disease)   . H/O hiatal hernia    "think so" (02/06/2014)  . Headache    "resolved w/BP control in 11/2015" (02/03/2017)  . High cholesterol   . Hypertension   . Obesity   . Pernicious anemia-B12 deficiency 04/18/2013  . PONV (postoperative nausea and vomiting) 02/03/2017; 08/2016  . Sleep apnea     Past Surgical History:  Procedure Laterality Date  . APPENDECTOMY    . CARDIAC CATHETERIZATION  01/2013   "results were clear"  . CARPAL TUNNEL RELEASE Right   . CHOLECYSTECTOMY N/A 02/03/2017   Procedure: LAPAROSCOPIC CHOLECYSTECTOMY;  Surgeon: Greer Pickerel, MD;  Location: [facility];  Service: General;  Laterality: N/A;  . COLONOSCOPY  ~ 2014  . ESOPHAGOGASTRODUODENOSCOPY  ~ 2014  . INGUINAL HERNIA REPAIR  1966   "? side"  . KNEE ARTHROPLASTY Left 02/06/2014   Procedure: COMPUTER ASSISTED TOTAL KNEE ARTHROPLASTY;  Surgeon: [name], MD;  Location: [facility];  Service: Orthopedics;  Laterality: Left;  Cemented Left Total Knee Arthroplasty  . KNEE ARTHROSCOPY Left 08/2016   scar tissue cleaned out  . LAPAROSCOPIC CHOLECYSTECTOMY  02/03/2017  . LEFT HEART CATHETERIZATION WITH CORONARY ANGIOGRAM N/A 01/29/2013   Procedure: LEFT HEART CATHETERIZATION WITH CORONARY ANGIOGRAM;  Surgeon: Thayer Headings, MD;  [facility];  Service: Cardiovascular;  Laterality: N/A;  . MULTIPLE TOOTH EXTRACTIONS  09/2016  . SHOULDER ARTHROSCOPY Left 03/12/2013  . TOTAL KNEE ARTHROPLASTY Left 02/06/2014  . TOTAL KNEE ARTHROPLASTY Right 09/14/2017  . TOTAL KNEE ARTHROPLASTY Right 09/14/2017   Procedure: RIGHT TOTAL KNEE ARTHROPLASTY;  Surgeon: [name], MD;  Location: [place];  Service: Orthopedics;  Laterality: Right;    There were no vitals filed for this visit.  Subjective Assessment - 10/27/17 0801    Subjective  went to [employer] and set up membership; went to MD yesterday and he's pleased with progress just needs to work on strengthening    Patient Stated Goals  improve pain, motion and strength    Currently in Pain?  Yes    Pain Score  2     Pain Location  Knee    Pain Orientation  Right;Medial    Pain Descriptors / Indicators  Aching;Burning  Pain Type  Acute pain;Surgical pain    Pain Onset  More than a month ago    Pain Frequency  Constant    Aggravating Factors   prolonged walking                      [facility] Adult PT Treatment/Exercise - 10/27/17 0805      Knee/Hip Exercises: Stretches   Quad Stretch  Right;3 reps;30 seconds    Gastroc Stretch  3 reps;30 seconds      Knee/Hip Exercises: Aerobic   Recumbent Bike  x 8 min for ROM      Knee/Hip Exercises: Machines for Strengthening   Cybex Knee Extension  10# 2x10 RLE only    Cybex Knee Flexion  15# 2x10 RLE only    Total Gym Leg Press  25# 2x10; RLE only      Knee/Hip Exercises: Standing   Terminal Knee Extension  Right;20 reps;Theraband on blue foam     Theraband Level (Terminal Knee Extension)  Level 4 (Blue)    SLS  RLE with LLE hip flexion into mini lunge 2x5    Walking with Sports Cord  sidestepping with green theraband 15' x 2      Vasopneumatic   Number Minutes Vasopneumatic   15 minutes    Vasopnuematic Location   Knee    Vasopneumatic Pressure  Low    Vasopneumatic Temperature   34               PT Short Term Goals - 10/24/17 0840      PT SHORT TERM GOAL #1   Title  independent with initial HEP    Status  Achieved      PT SHORT TERM GOAL #2   Title  improve Rt knee AROM 0-110 for improved function and mobility    Status  Achieved        PT Long Term Goals - 10/24/17 0840      PT LONG TERM GOAL #1   Title  independent with advanced HEP    Status  On-going    Target Date  11/29/17      PT LONG TERM GOAL #2   Title  amb > 500' and negotiate stairs without device or gait deviations    Status  On-going      PT LONG TERM GOAL #3   Title  improve Rt knee AROM 0-120 for improved function and mobility    Status  Achieved      PT LONG TERM GOAL #4   Title  perform work simulate activities including kneeling and crawling (with knee pads) without increase in pain in preparation for return to work    Status  On-going      PT LONG TERM GOAL #5   Title  report pain < 3/10 with ambulation and standing activities for improved activity tolerance    Status  On-going            Plan - 10/27/17 0842    Clinical Impression Statement  Pt tolerated strengthening exercises well today with noticable fatigue with SLS activities.  Progressing well with PT.  Plan to decr freq to 1x/wk after next week to begin transition to community fitness.    PT Treatment/Interventions  ADLs/Self Care Home Management;Cryotherapy;Electrical Stimulation;Moist Heat;Therapeutic exercise;Therapeutic activities;Functional mobility training;Stair training;Gait training;DME Instruction;Balance training;Neuromuscular re-education;Patient/family  education;Manual techniques;Vasopneumatic Device;Taping;Dry needling;Passive range of motion;Scar mobilization    PT Next Visit Plan  maintain ROM; RLE strengthening; modalities PRN  for pain and swelling    Consulted and Agree with Plan of Care  Patient       Patient will benefit from skilled therapeutic intervention in order to improve the following deficits and impairments:  Abnormal gait, Decreased balance, Decreased range of motion, Impaired flexibility, Difficulty walking, Decreased mobility, Increased fascial restricitons, Increased muscle spasms, Decreased strength, Increased edema  Visit Diagnosis: Acute pain of right knee  Other abnormalities of gait and mobility  Stiffness of right knee, not elsewhere classified     Problem List Patient Active Problem List   Diagnosis Date Noted  . Arthritis of right knee 09/14/2017  . 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 Sleep apnea 03/08/2017  . Hyperlipidemia with target LDL less than 100 12/22/2016  . Primary erectile dysfunction 11/18/2014  . Essential hypertension 07/15/2014  . DDD (degenerative disc disease), cervical 10/03/2013  . Esophageal ring, acquired 07/12/2013  . Crohn's ileitis - working dx 06/01/2013  . BPH (benign prostatic hyperplasia) 04/18/2013  . Pernicious anemia-B12 deficiency 04/18/2013  . Severe obesity (BMI >= 40) ([address]) 12/21/2012  . GERD (gastroesophageal reflux disease) 12/21/2012  . Hyperglycemia 08/29/2012  . Routine general medical examination at a health care facility 08/29/2012      Laureen Abrahams, PT, DPT 10/27/17 8:44 AM    [facility] [place], [place], [address] Phone: [phone]   Fax:  [phone]  Name: SETH HIGGINBOTHAM MRN: [id number] Date of Birth: 02-13-1966

## 2017-11-01 ENCOUNTER — Ambulatory Visit: Payer: BC Managed Care – PPO | Attending: Orthopaedic Surgery | Admitting: Physical Therapy

## 2017-11-01 ENCOUNTER — Encounter: Payer: Self-pay | Admitting: Physical Therapy

## 2017-11-01 DIAGNOSIS — M25661 Stiffness of right knee, not elsewhere classified: Secondary | ICD-10-CM | POA: Diagnosis present

## 2017-11-01 DIAGNOSIS — R2689 Other abnormalities of gait and mobility: Secondary | ICD-10-CM | POA: Insufficient documentation

## 2017-11-01 DIAGNOSIS — M25561 Pain in right knee: Secondary | ICD-10-CM | POA: Insufficient documentation

## 2017-11-01 NOTE — Therapy (Signed)
[place], [place], [address] Phone: [phone]   Fax:  [phone]  Physical Therapy Treatment  Patient Details  Name: Taylor Schroeder MRN: [id number] Date of Birth: 1965-12-08 Referring Provider: Dr. Rodell Perna   Encounter Date: 11/01/2017  PT End of Session - 11/01/17 0837    Visit Number  8    Number of Visits  16    Date for PT Re-Evaluation  11/29/17    Authorization Type  BCBS    PT Start Time  0757    PT Stop Time  0840    PT Time Calculation (min)  43 min    Activity Tolerance  Patient tolerated treatment well    Behavior During Therapy  [facility] for tasks assessed/performed       Past Medical History:  Diagnosis Date  . Arthritis    "qwhere" (02/03/2017)  . BPH (benign prostatic hyperplasia)   . Childhood asthma    "as a baby"  . Coronary artery disease Non-obstructive   a. nonobs cath in 2006;  b. 01/2013 Cath: LM nl, LAD 45m, LCX nl, RCA non-dom, nl. EF 60-65%.  . Crohn's ileitis - working dx 06/01/2013   "a mild case of it"  . Esophageal ring, acquired 07/12/2013  . GERD (gastroesophageal reflux disease)   . H/O hiatal hernia    "think so" (02/06/2014)  . Headache    "resolved w/BP control in 11/2015" (02/03/2017)  . High cholesterol   . Hypertension   . Obesity   . Pernicious anemia-B12 deficiency 04/18/2013  . PONV (postoperative nausea and vomiting) 02/03/2017; 08/2016  . Sleep apnea     Past Surgical History:  Procedure Laterality Date  . APPENDECTOMY    . CARDIAC CATHETERIZATION  01/2013   "results were clear"  . CARPAL TUNNEL RELEASE Right   . CHOLECYSTECTOMY N/A 02/03/2017   Procedure: LAPAROSCOPIC CHOLECYSTECTOMY;  Surgeon: Greer Pickerel, MD;  Location: [place];  Service: General;  Laterality: N/A;  . COLONOSCOPY  ~ 2014  . ESOPHAGOGASTRODUODENOSCOPY  ~ 2014  . INGUINAL HERNIA REPAIR  1966   "? side"  . KNEE ARTHROPLASTY Left 02/06/2014   Procedure: COMPUTER ASSISTED TOTAL  KNEE ARTHROPLASTY;  Surgeon: [name], MD;  Location: [place];  Service: Orthopedics;  Laterality: Left;  Cemented Left Total Knee Arthroplasty  . KNEE ARTHROSCOPY Left 08/2016   scar tissue cleaned out  . LAPAROSCOPIC CHOLECYSTECTOMY  02/03/2017  . LEFT HEART CATHETERIZATION WITH CORONARY ANGIOGRAM N/A 01/29/2013   Procedure: LEFT HEART CATHETERIZATION WITH CORONARY ANGIOGRAM;  Surgeon: Thayer Headings, MD;  Location: [facility] C[facility] LAB;  Service: Cardiovascular;  Laterality: N/A;  . MULTIPLE TOOTH EXTRACTIONS  09/2016  . SHOULDER ARTHROSCOPY Left 03/12/2013  . TOTAL KNEE ARTHROPLASTY Left 02/06/2014  . TOTAL KNEE ARTHROPLASTY Right 09/14/2017  . TOTAL KNEE ARTHROPLASTY Right 09/14/2017   Procedure: RIGHT TOTAL KNEE ARTHROPLASTY;  Surgeon: [name], MD;  Location: [place];  Service: Orthopedics;  Laterality: Right;    There were no vitals filed for this visit.  Subjective Assessment - 11/01/17 0757    Subjective  walked about a mile yesterday; knee is sore.    Patient Stated Goals  improve pain, motion and strength    Currently in Pain?  Yes    Pain Score  2     Pain Location  Knee    Pain Orientation  Right;Medial    Pain Descriptors / Indicators  Sore    Pain Type  Acute  pain;Surgical pain    Pain Onset  More than a month ago    Pain Frequency  Constant    Aggravating Factors   prolonged walking    Pain Relieving Factors  aspercreme, meds                      [facility] Adult PT Treatment/Exercise - 11/01/17 0801      Knee/Hip Exercises: Stretches   Quad Stretch  Right;3 reps;30 seconds    ITB Stretch  Right;3 reps;30 seconds    Gastroc Stretch  3 reps;30 seconds      Knee/Hip Exercises: Aerobic   Recumbent Bike  x 8 min for ROM      Knee/Hip Exercises: Machines for Strengthening   Cybex Knee Extension  10# 2x10 RLE only    Cybex Knee Flexion  15# 2x10 RLE only    Total Gym Leg Press  25# 2x10; RLE only      Knee/Hip Exercises: Standing   Forward Step Up  10  reps;Both onto BOSU    Functional Squat  2 sets;10 reps on BOSU    SLS  RLE on BOSU: LLE hip flexion/abdct/ext x 5 reps               PT Short Term Goals - 10/24/17 0840      PT SHORT TERM GOAL #1   Title  independent with initial HEP    Status  Achieved      PT SHORT TERM GOAL #2   Title  improve Rt knee AROM 0-110 for improved function and mobility    Status  Achieved        PT Long Term Goals - 10/24/17 0840      PT LONG TERM GOAL #1   Title  independent with advanced HEP    Status  On-going    Target Date  11/29/17      PT LONG TERM GOAL #2   Title  amb > 500' and negotiate stairs without device or gait deviations    Status  On-going      PT LONG TERM GOAL #3   Title  improve Rt knee AROM 0-120 for improved function and mobility    Status  Achieved      PT LONG TERM GOAL #4   Title  perform work simulate activities including kneeling and crawling (with knee pads) without increase in pain in preparation for return to work    Status  On-going      PT LONG TERM GOAL #5   Title  report pain < 3/10 with ambulation and standing activities for improved activity tolerance    Status  On-going            Plan - 11/01/17 0837    Clinical Impression Statement  Pt progressing well with PT, and plan to decrease frequency to 1x/wk after this week to transition to gym program.  Continue to focus on strengthening.    PT Treatment/Interventions  ADLs/Self Care Home Management;Cryotherapy;Electrical Stimulation;Moist Heat;Therapeutic exercise;Therapeutic activities;Functional mobility training;Stair training;Gait training;DME Instruction;Balance training;Neuromuscular re-education;Patient/family education;Manual techniques;Vasopneumatic Device;Taping;Dry needling;Passive range of motion;Scar mobilization    PT Next Visit Plan  maintain ROM; RLE strengthening; modalities PRN for pain and swelling    Consulted and Agree with Plan of Care  Patient       Patient will  benefit from skilled therapeutic intervention in order to improve the following deficits and impairments:  Abnormal gait, Decreased balance, Decreased range of motion, Impaired  flexibility, Difficulty walking, Decreased mobility, Increased fascial restricitons, Increased muscle spasms, Decreased strength, Increased edema  Visit Diagnosis: Acute pain of right knee  Other abnormalities of gait and mobility  Stiffness of right knee, not elsewhere classified     Problem List Patient Active Problem List   Diagnosis Date Noted  . Arthritis of right knee 09/14/2017  . 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 Sleep apnea 03/08/2017  . Hyperlipidemia with target LDL less than 100 12/22/2016  . Primary erectile dysfunction 11/18/2014  . Essential hypertension 07/15/2014  . DDD (degenerative disc disease), cervical 10/03/2013  . Esophageal ring, acquired 07/12/2013  . Crohn's ileitis - working dx 06/01/2013  . BPH (benign prostatic hyperplasia) 04/18/2013  . Pernicious anemia-B12 deficiency 04/18/2013  . Severe obesity (BMI >= 40) ([place]) 12/21/2012  . GERD (gastroesophageal reflux disease) 12/21/2012  . Hyperglycemia 08/29/2012  . Routine general medical examination at a health care facility 08/29/2012       Laureen Abrahams, PT, DPT 11/01/17 8:40 AM    [facility] [address] [place], [place], [address] Phone: [phone]   Fax:  [phone]  Name: Taylor Schroeder MRN: [id number] Date of Birth: [birth year]

## 2017-11-03 ENCOUNTER — Ambulatory Visit: Payer: BC Managed Care – PPO | Admitting: Physical Therapy

## 2017-11-03 DIAGNOSIS — M25661 Stiffness of right knee, not elsewhere classified: Secondary | ICD-10-CM

## 2017-11-03 DIAGNOSIS — R2689 Other abnormalities of gait and mobility: Secondary | ICD-10-CM

## 2017-11-03 DIAGNOSIS — M25561 Pain in right knee: Secondary | ICD-10-CM | POA: Diagnosis not present

## 2017-11-03 NOTE — Therapy (Signed)
[place], [place], [address] Phone: [phone]   Fax:  [phone]  Physical Therapy Treatment  Patient Details  Name: Taylor Schroeder MRN: [id number] Date of Birth: 1965-12-05 Referring Provider: Dr. Rodell Perna   Encounter Date: 11/03/2017  PT End of Session - 11/03/17 0808    Visit Number  9    Number of Visits  16    Date for PT Re-Evaluation  11/29/17    Authorization Type  BCBS    PT Start Time  0801    PT Stop Time  0842    PT Time Calculation (min)  41 min       Past Medical History:  Diagnosis Date  . Arthritis    "qwhere" (02/03/2017)  . BPH (benign prostatic hyperplasia)   . Childhood asthma    "as a baby"  . Coronary artery disease Non-obstructive   a. nonobs cath in 2006;  b. 01/2013 Cath: LM nl, LAD 32m LCX nl, RCA non-dom, nl. EF 60-65%.  . Crohn's ileitis - working dx 06/01/2013   "a mild case of it"  . Esophageal ring, acquired 07/12/2013  . GERD (gastroesophageal reflux disease)   . H/O hiatal hernia    "think so" (02/06/2014)  . Headache    "resolved w/BP control in 11/2015" (02/03/2017)  . High cholesterol   . Hypertension   . Obesity   . Pernicious anemia-B12 deficiency 04/18/2013  . PONV (postoperative nausea and vomiting) 02/03/2017; 08/2016  . Sleep apnea     Past Surgical History:  Procedure Laterality Date  . APPENDECTOMY    . CARDIAC CATHETERIZATION  01/2013   "results were clear"  . CARPAL TUNNEL RELEASE Right   . CHOLECYSTECTOMY N/A 02/03/2017   Procedure: LAPAROSCOPIC CHOLECYSTECTOMY;  Surgeon: WGreer Pickerel MD;  [place]  Service: General;  Laterality: N/A;  . COLONOSCOPY  ~ 2014  . ESOPHAGOGASTRODUODENOSCOPY  ~ 2014  . INGUINAL HERNIA REPAIR  1966   "? side"  . KNEE ARTHROPLASTY Left 02/06/2014   Procedure: COMPUTER ASSISTED TOTAL KNEE ARTHROPLASTY;  Surgeon: MMarybelle Killings MD;  [place]  Service: Orthopedics;  Laterality: Left;  Cemented Left  Total Knee Arthroplasty  . KNEE ARTHROSCOPY Left 08/2016   scar tissue cleaned out  . LAPAROSCOPIC CHOLECYSTECTOMY  02/03/2017  . LEFT HEART CATHETERIZATION WITH CORONARY ANGIOGRAM N/A 01/29/2013   Procedure: LEFT HEART CATHETERIZATION WITH CORONARY ANGIOGRAM;  Surgeon: PThayer Headings MD;  [facility];  Service: Cardiovascular;  Laterality: N/A;  . MULTIPLE TOOTH EXTRACTIONS  09/2016  . SHOULDER ARTHROSCOPY Left 03/12/2013  . TOTAL KNEE ARTHROPLASTY Left 02/06/2014  . TOTAL KNEE ARTHROPLASTY Right 09/14/2017  . TOTAL KNEE ARTHROPLASTY Right 09/14/2017   Procedure: RIGHT TOTAL KNEE ARTHROPLASTY;  Surgeon: YMarybelle Killings MD;  [facility]  Service: [place]ics;  Laterality: Right;    There were no vitals filed for this visit.                   [facility] Adult PT Treatment/Exercise - 11/03/17 0001      Ambulation/Gait   Ambulation/Gait  Yes    Ambulation Distance (Feet)  500 Feet    Assistive device  None    Gait Pattern  Within Functional Limits    Ambulation Surface  Level;Indoor      Knee/Hip Exercises: SDiplomatic Services operational officer 30 seconds;3 reps    Quad Stretch  Right;3 reps;30 seconds prone with strap  ITB Stretch  Right;3 reps;30 seconds    Gastroc Stretch  3 reps;30 seconds      Knee/Hip Exercises: Aerobic   Recumbent Bike  L1 x 8 minutes       Knee/Hip Exercises: Machines for Strengthening   Cybex Knee Extension  10# 2x10 RLE only    Cybex Knee Flexion  15# 2x10 RLE only    Total Gym Leg Press  25# 2x10; RLE only      Knee/Hip Exercises: Standing   Lateral Step Up  10 reps;Step Height: 4";Hand Hold: 1 difficult, cues for control    Step Down  10 reps;Step Height: 4";Hand Hold: 1 difficult     Stairs  up and down reciprocal stairs without handrails - 4/10 pain on descent       Knee/Hip Exercises: Supine   Straight Leg Raises  Right;10 reps;2 sets    Straight Leg Raises Limitations  2#      Knee/Hip Exercises: Sidelying   Hip  ABduction  2 sets;10 reps 2#               PT Short Term Goals - 10/24/17 0840      PT SHORT TERM GOAL #1   Title  independent with initial HEP    Status  Achieved      PT SHORT TERM GOAL #2   Title  improve Rt knee AROM 0-110 for improved function and mobility    Status  Achieved        PT Long Term Goals - 11/03/17 0837      PT LONG TERM GOAL #1   Title  independent with advanced HEP    Status  On-going      PT LONG TERM GOAL #2   Title  amb > 500' and negotiate stairs without device or gait deviations    Baseline  minor gait deviations    Status  Partially Met      PT LONG TERM GOAL #3   Title  improve Rt knee AROM 0-120 for improved function and mobility    Status  Achieved      PT LONG TERM GOAL #4   Title  perform work simulate activities including kneeling and crawling (with knee pads) without increase in pain in preparation for return to work    Status  On-going      PT LONG TERM GOAL #5   Title  report pain < 3/10 with ambulation and standing activities for improved activity tolerance    Baseline  Pt reports more weakness than pain, 4/10 with descending stairs     Status  On-going            Plan - 11/03/17 0827    Clinical Impression Statement  Pt reports sorenss affecting sleep after last session. Checked gait distance and stairs today; he ambulated 500 ft and negotiated stairs without handrails. He has a minor gait deviation. He ambulated a mile while shopping and only reports fatigue, no increased pain. He does have pain afterward, 6/10. LTG# 2 partially met.     PT Next Visit Plan  maintain ROM; RLE strengthening; modalities PRN for pain and swelling    Consulted and Agree with Plan of Care  Patient       Patient will benefit from skilled therapeutic intervention in order to improve the following deficits and impairments:  Abnormal gait, Decreased balance, Decreased range of motion, Impaired flexibility, Difficulty walking, Decreased  mobility, Increased fascial restricitons, Increased muscle spasms, Decreased strength,  Increased edema  Visit Diagnosis: Acute pain of right knee  Other abnormalities of gait and mobility  Stiffness of right knee, not elsewhere classified     Problem List Patient Active Problem List   Diagnosis Date Noted  . Arthritis of right knee 09/14/2017  . 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 Sleep apnea 03/08/2017  . Hyperlipidemia with target LDL less than 100 12/22/2016  . Primary erectile dysfunction 11/18/2014  . Essential hypertension 07/15/2014  . DDD (degenerative disc disease), cervical 10/03/2013  . Esophageal ring, acquired 07/12/2013  . Crohn's ileitis - working dx 06/01/2013  . BPH (benign prostatic hyperplasia) 04/18/2013  . Pernicious anemia-B12 deficiency 04/18/2013  . Severe obesity (BMI >= 40) ([place]) 12/21/2012  . GERD (gastroesophageal reflux disease) 12/21/2012  . Hyperglycemia 08/29/2012  . Routine general medical examination at a health care facility 08/29/2012    Dorene Ar, [place] 11/03/2017, 8:44 AM  [place], [place], [address] Phone: [phone]   Fax:  [phone]  Name: TAYVEON LOMBARDO MRN: [id number] Date of Birth: 06-20-1966

## 2017-11-10 ENCOUNTER — Ambulatory Visit: Payer: BC Managed Care – PPO | Admitting: Physical Therapy

## 2017-11-10 ENCOUNTER — Encounter: Payer: Self-pay | Admitting: Physical Therapy

## 2017-11-10 DIAGNOSIS — R2689 Other abnormalities of gait and mobility: Secondary | ICD-10-CM

## 2017-11-10 DIAGNOSIS — M25561 Pain in right knee: Secondary | ICD-10-CM | POA: Diagnosis not present

## 2017-11-10 DIAGNOSIS — M25661 Stiffness of right knee, not elsewhere classified: Secondary | ICD-10-CM

## 2017-11-10 NOTE — Therapy (Signed)
[place], [place], [address] Phone: [phone]   Fax:  [phone]  Physical Therapy Treatment  Patient Details  Name: Taylor Schroeder MRN: [id number] Date of Birth: 1966/07/30 Referring Provider: Dr. Rodell Perna   Encounter Date: 11/10/2017  PT End of Session - 11/10/17 0840    Visit Number  10    Number of Visits  16    Date for PT Re-Evaluation  11/29/17    Authorization Type  BCBS    PT Start Time  0802    PT Stop Time  0840    PT Time Calculation (min)  38 min    Activity Tolerance  Patient tolerated treatment well    Behavior During Therapy  [facility] for tasks assessed/performed       Past Medical History:  Diagnosis Date  . Arthritis    "qwhere" (02/03/2017)  . BPH (benign prostatic hyperplasia)   . Childhood asthma    "as a baby"  . Coronary artery disease Non-obstructive   a. nonobs cath in 2006;  b. 01/2013 Cath: LM nl, LAD 30m LCX nl, RCA non-dom, nl. EF 60-65%.  . Crohn's ileitis - working dx 06/01/2013   "a mild case of it"  . Esophageal ring, acquired 07/12/2013  . GERD (gastroesophageal reflux disease)   . H/O hiatal hernia    "think so" (02/06/2014)  . Headache    "resolved w/BP control in 11/2015" (02/03/2017)  . High cholesterol   . Hypertension   . Obesity   . Pernicious anemia-B12 deficiency 04/18/2013  . PONV (postoperative nausea and vomiting) 02/03/2017; 08/2016  . Sleep apnea     Past Surgical History:  Procedure Laterality Date  . APPENDECTOMY    . CARDIAC CATHETERIZATION  01/2013   "results were clear"  . CARPAL TUNNEL RELEASE Right   . CHOLECYSTECTOMY N/A 02/03/2017   Procedure: LAPAROSCOPIC CHOLECYSTECTOMY;  Surgeon: WGreer Pickerel MD;  [place]  Service: General;  Laterality: N/A;  . COLONOSCOPY  ~ 2014  . ESOPHAGOGASTRODUODENOSCOPY  ~ 2014  . INGUINAL HERNIA REPAIR  1966   "? side"  . KNEE ARTHROPLASTY Left 02/06/2014   Procedure: COMPUTER ASSISTED TOTAL  KNEE ARTHROPLASTY;  Surgeon: MMarybelle Killings MD;  [place]  Service: Orthopedics;  Laterality: Left;  Cemented Left Total Knee Arthroplasty  . KNEE ARTHROSCOPY Left 08/2016   scar tissue cleaned out  . LAPAROSCOPIC CHOLECYSTECTOMY  02/03/2017  . LEFT HEART CATHETERIZATION WITH CORONARY ANGIOGRAM N/A 01/29/2013   Procedure: LEFT HEART CATHETERIZATION WITH CORONARY ANGIOGRAM;  Surgeon: PThayer Headings MD;  [facility]  Service: Cardiovascular;  Laterality: N/A;  . MULTIPLE TOOTH EXTRACTIONS  09/2016  . SHOULDER ARTHROSCOPY Left 03/12/2013  . TOTAL KNEE ARTHROPLASTY Left 02/06/2014  . TOTAL KNEE ARTHROPLASTY Right 09/14/2017  . TOTAL KNEE ARTHROPLASTY Right 09/14/2017   Procedure: RIGHT TOTAL KNEE ARTHROPLASTY;  Surgeon: YMarybelle Killings MD;  [place]  Service: Orthopedics;  Laterality: Right;    There were no vitals filed for this visit.  Subjective Assessment - 11/10/17 0804    Subjective  mother had TKA earlier this week so he has been doing a lot of walking around hospital; knee is sore today.    Patient Stated Goals  improve pain, motion and strength    Currently in Pain?  No/denies                      [facility] -  11[facility] 0807      Knee/Hip Exercises: Stretches   Gastroc Stretch  3 reps;30 seconds      Knee/Hip Exercises: Aerobic   Recumbent Bike  L1 x 8 minutes       Knee/Hip Exercises: Machines for Strengthening   Cybex Knee Extension  15# 2x10 RLE only    Cybex Knee Flexion  20# 2x10 RLE only    Total Gym Leg Press  30# 2x10; RLE only      Knee/Hip Exercises: Standing   Hip Abduction  Both;15 reps;Knee straight    Abduction Limitations  on BOSU; 4#; cues to maintain knee extension    Hip Extension  Both;15 reps;Knee straight    Extension Limitations  on BOSU; 4#    Forward Step Up  10 reps;Both on BOSU               PT Short Term Goals - 10/24/17 0840      PT SHORT TERM GOAL #1   Title   independent with initial HEP    Status  Achieved      PT SHORT TERM GOAL #2   Title  improve Rt knee AROM 0-110 for improved function and mobility    Status  Achieved        PT Long Term Goals - 11/03/17 0837      PT LONG TERM GOAL #1   Title  independent with advanced HEP    Status  On-going      PT LONG TERM GOAL #2   Title  amb > 500' and negotiate stairs without device or gait deviations    Baseline  minor gait deviations    Status  Partially Met      PT LONG TERM GOAL #3   Title  improve Rt knee AROM 0-120 for improved function and mobility    Status  Achieved      PT LONG TERM GOAL #4   Title  perform work simulate activities including kneeling and crawling (with knee pads) without increase in pain in preparation for return to work    Status  On-going      PT LONG TERM GOAL #5   Title  report pain < 3/10 with ambulation and standing activities for improved activity tolerance    Baseline  Pt reports more weakness than pain, 4/10 with descending stairs     Status  On-going            Plan - 11/10/17 0840    Clinical Impression Statement  Pt tolerated strengthening session well with expected muscle fatigue following session.  Will continue to benefit from PT to maximize function.    PT Treatment/Interventions  ADLs/Self Care Home Management;Cryotherapy;Electrical Stimulation;Moist Heat;Therapeutic exercise;Therapeutic activities;Functional mobility training;Stair training;Gait training;DME Instruction;Balance training;Neuromuscular re-education;Patient/family education;Manual techniques;Vasopneumatic Device;Taping;Dry needling;Passive range of motion;Scar mobilization    PT Next Visit Plan  maintain ROM; RLE strengthening; modalities PRN for pain and swelling    Consulted and Agree with Plan of Care  Patient       Patient will benefit from skilled therapeutic intervention in order to improve the following deficits and impairments:  Abnormal gait, Decreased balance,  Decreased range of motion, Impaired flexibility, Difficulty walking, Decreased mobility, Increased fascial restricitons, Increased muscle spasms, Decreased strength, Increased edema  Visit Diagnosis: Acute pain of right knee  Other abnormalities of gait and mobility  Stiffness of right knee, not elsewhere classified     Problem List Patient Active Problem List   Diagnosis Date Noted  .  Arthritis of right knee 09/14/2017  . 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 Sleep apnea 03/08/2017  . Hyperlipidemia with target LDL less than 100 12/22/2016  . Primary erectile dysfunction 11/18/2014  . Essential hypertension 07/15/2014  . DDD (degenerative disc disease), cervical 10/03/2013  . Esophageal ring, acquired 07/12/2013  . Crohn's ileitis - working dx 06/01/2013  . BPH (benign prostatic hyperplasia) 04/18/2013  . Pernicious anemia-B12 deficiency 04/18/2013  . Severe obesity (BMI >= 40) ([place]) 12/21/2012  . GERD (gastroesophageal reflux disease) 12/21/2012  . Hyperglycemia 08/29/2012  . Routine general medical examination at a health care facility 08/29/2012      Laureen Abrahams, PT, DPT 11/10/17 8:42 AM    [place], [place], [address] Phone: [phone]   Fax:  [phone]  Name: JAASIEL HOLLYFIELD MRN: [id number] Date of Birth: 1965-11-06

## 2017-11-17 ENCOUNTER — Encounter: Payer: Self-pay | Admitting: Physical Therapy

## 2017-11-17 ENCOUNTER — Ambulatory Visit: Payer: BC Managed Care – PPO | Admitting: Physical Therapy

## 2017-11-17 DIAGNOSIS — M25561 Pain in right knee: Secondary | ICD-10-CM

## 2017-11-17 DIAGNOSIS — M25661 Stiffness of right knee, not elsewhere classified: Secondary | ICD-10-CM

## 2017-11-17 DIAGNOSIS — R2689 Other abnormalities of gait and mobility: Secondary | ICD-10-CM

## 2017-11-17 NOTE — Therapy (Addendum)
[place], [place], [address] Phone: [phone]   Fax:  [phone]  Physical Therapy Treatment/Discharge  Patient Details  Name: Taylor Schroeder MRN: [id number] Date of Birth: June 10, 1966 Referring Provider: Dr. Rodell Perna   Encounter Date: 11/17/2017  PT End of Session - 11/17/17 0838    Visit Number  11    Number of Visits  16    Date for PT Re-Evaluation  11/29/17    Authorization Type  BC[employer]    PT Start Time  0758    PT Stop Time  0837    PT Time Calculation (min)  39 min    Activity Tolerance  Patient tolerated treatment well    Behavior During Therapy  [facility] for tasks assessed/performed       Past Medical History:  Diagnosis Date  . Arthritis    "qwhere" (02/03/2017)  . BPH (benign prostatic hyperplasia)   . Childhood asthma    "as a baby"  . Coronary artery disease Non-obstructive   a. nonobs cath in 2006;  b. 01/2013 Cath: LM nl, LAD 58m LCX nl, RCA non-dom, nl. EF 60-65%.  . Crohn's ileitis - working dx 06/01/2013   "a mild case of it"  . Esophageal ring, acquired 07/12/2013  . GERD (gastroesophageal reflux disease)   . H/O hiatal hernia    "think so" (02/06/2014)  . Headache    "resolved w/BP control in 11/2015" (02/03/2017)  . High cholesterol   . Hypertension   . Obesity   . Pernicious anemia-B12 deficiency 04/18/2013  . PONV (postoperative nausea and vomiting) 02/03/2017; 08/2016  . Sleep apnea     Past Surgical History:  Procedure Laterality Date  . APPENDECTOMY    . CARDIAC CATHETERIZATION  01/2013   "results were clear"  . CARPAL TUNNEL RELEASE Right   . CHOLECYSTECTOMY N/A 02/03/2017   Procedure: LAPAROSCOPIC CHOLECYSTECTOMY;  Surgeon: WGreer Pickerel MD;  [place]  Service: General;  Laterality: N/A;  . COLONOSCOPY  ~ 2014  . ESOPHAGOGASTRODUODENOSCOPY  ~ 2014  . INGUINAL HERNIA REPAIR  1966   "? side"  . KNEE ARTHROPLASTY Left 02/06/2014   Procedure: COMPUTER  ASSISTED TOTAL KNEE ARTHROPLASTY;  Surgeon: MMarybelle Killings MD;  [place] [facility]  Service: Orthopedics;  Laterality: Left;  Cemented Left Total Knee Arthroplasty  . KNEE ARTHROSCOPY Left 08/2016   scar tissue cleaned out  . LAPAROSCOPIC CHOLECYSTECTOMY  02/03/2017  . LEFT HEART CATHETERIZATION WITH CORONARY ANGIOGRAM N/A 01/29/2013   Procedure: LEFT HEART CATHETERIZATION WITH CORONARY ANGIOGRAM;  Surgeon: [place] Headings MD;  [facility];  Service: Cardiovascular;  Laterality: N/A;  . MULTIPLE TOOTH EXTRACTIONS  09/2016  . SHOULDER ARTHROSCOPY Left 03/12/2013  . TOTAL KNEE ARTHROPLASTY Left 02/06/2014  . TOTAL KNEE ARTHROPLASTY Right 09/14/2017  . TOTAL KNEE ARTHROPLASTY Right 09/14/2017   Procedure: RIGHT TOTAL KNEE ARTHROPLASTY;  Surgeon: YMarybelle Killings MD;  [place] [facility]  Service: Orthopedics;  Laterality: Right;    There were no vitals filed for this visit.  Subjective Assessment - 11/17/17 0800    Subjective  having some soreness just below patella    Patient Stated Goals  improve pain, motion and strength    Pain Score  3  up to 8-9/10    Pain Location  Knee    Pain Orientation  Right;Medial    Pain Descriptors / Indicators  Sore;Stabbing    Pain Onset  More than a month ago  Pain Frequency  Constant    Aggravating Factors   since starting gym    Pain Relieving Factors  aspercreme, meds                      [facility] Adult PT Treatment/Exercise - 11/17/17 0802      Knee/Hip Exercises: Stretches   Gastroc Stretch  3 reps;30 seconds;Other (comment) with plantar fascia stretch      Knee/Hip Exercises: Aerobic   Recumbent Bike  L3 x 8 min      Knee/Hip Exercises: Standing   Forward Step Up  2 sets;10 reps;Hand Hold: 0;Step Height: 8"    Step Down  Right;10 reps;Hand Hold: 0;Step Height: 4" cues for eccentric control    SLS  RLE on green foam: LLE hip flexion/abdct/ext x 10 reps; no UE support    SLS with Vectors  UE flexion with yellow med ball; RLE on  green foam      Knee/Hip Exercises: Seated   Sit to Sand  5 reps;without UE support               PT Short Term Goals - 10/24/17 0840      PT SHORT TERM GOAL #1   Title  independent with initial HEP    Status  Achieved      PT SHORT TERM GOAL #2   Title  improve Rt knee AROM 0-110 for improved function and mobility    Status  Achieved        PT Long Term Goals - 11/03/17 7062      PT LONG TERM GOAL #1   Title  independent with advanced HEP    Status  On-going      PT LONG TERM GOAL #2   Title  amb > 500' and negotiate stairs without device or gait deviations    Baseline  minor gait deviations    Status  Partially Met      PT LONG TERM GOAL #3   Title  improve Rt knee AROM 0-120 for improved function and mobility    Status  Achieved      PT LONG TERM GOAL #4   Title  perform work simulate activities including kneeling and crawling (with knee pads) without increase in pain in preparation for return to work    Status  On-going      PT LONG TERM GOAL #5   Title  report pain < 3/10 with ambulation and standing activities for improved activity tolerance    Baseline  Pt reports more weakness than pain, 4/10 with descending stairs     Status  On-going            Plan - 11/17/17 3762    Clinical Impression Statement  Pt continues to demonstrate functional quad weakness but overall improving strength.  Anticipate pt will be ready to transition to community fitness 1-2 weeks.    PT Treatment/Interventions  ADLs/Self Care Home Management;Cryotherapy;Electrical Stimulation;Moist Heat;Therapeutic exercise;Therapeutic activities;Functional mobility training;Stair training;Gait training;DME Instruction;Balance training;Neuromuscular re-education;Patient/family education;Manual techniques;Vasopneumatic Device;Taping;Dry needling;Passive range of motion;Scar mobilization    PT Next Visit Plan  maintain ROM; RLE strengthening; modalities PRN for pain and swelling     Consulted and Agree with Plan of Care  Patient       Patient will benefit from skilled therapeutic intervention in order to improve the following deficits and impairments:  Abnormal gait, Decreased balance, Decreased range of motion, Impaired flexibility, Difficulty walking, Decreased mobility, Increased fascial restricitons, Increased  muscle spasms, Decreased strength, Increased edema  Visit Diagnosis: Acute pain of right knee  Other abnormalities of gait and mobility  Stiffness of right knee, not elsewhere classified     Problem List Patient Active Problem List   Diagnosis Date Noted  . Arthritis of right knee 09/14/2017  . 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 Sleep apnea 03/08/2017  . Hyperlipidemia with target LDL less than 100 12/22/2016  . Primary erectile dysfunction 11/18/2014  . Essential hypertension 07/15/2014  . DDD (degenerative disc disease), cervical 10/03/2013  . Esophageal ring, acquired 07/12/2013  . Crohn's ileitis - working dx 06/01/2013  . BPH (benign prostatic hyperplasia) 04/18/2013  . Pernicious anemia-B12 deficiency 04/18/2013  . Severe obesity (BMI >= 40) ([place]) 12/21/2012  . GERD (gastroesophageal reflux disease) 12/21/2012  . Hyperglycemia 08/29/2012  . Routine general medical examination at a health care facility 08/29/2012      Laureen Abrahams, PT, DPT 11/17/17 8:39 AM    [place], [place], [address] Phone: [phone]   Fax:  [phone]  Name: Taylor Schroeder MRN: [id number] Date of Birth: [birth year]      PHYSICAL THERAPY DISCHARGE SUMMARY  Visits from Start of Care: 11  Current functional level related to goals / functional outcomes: See above   Remaining deficits: Unknown; pt cx last appt and did not reschedule.  Per MD notes pt now cleared to return to work and is continuing to exercise at fitness center.   Education /  Equipment: HEP  Plan: Patient agrees to discharge.  Patient goals were partially met. Patient is being discharged due to not returning since the last visit.  ?????     Laureen Abrahams, PT, DPT 12/29/17 1:12 PM   [facility] [address], [place] [address]  [phone] (office) [phone] (fax)

## 2017-11-23 ENCOUNTER — Encounter (INDEPENDENT_AMBULATORY_CARE_PROVIDER_SITE_OTHER): Payer: Self-pay | Admitting: Orthopaedic Surgery

## 2017-11-23 ENCOUNTER — Ambulatory Visit (INDEPENDENT_AMBULATORY_CARE_PROVIDER_SITE_OTHER): Payer: BC Managed Care – PPO | Admitting: Orthopaedic Surgery

## 2017-11-23 VITALS — BP 146/95 | HR 90 | Ht 69.0 in | Wt 270.0 lb

## 2017-11-23 DIAGNOSIS — Z96652 Presence of left artificial knee joint: Secondary | ICD-10-CM

## 2017-11-23 DIAGNOSIS — Z96651 Presence of right artificial knee joint: Secondary | ICD-10-CM | POA: Insufficient documentation

## 2017-11-23 DIAGNOSIS — T380X5A Adverse effect of glucocorticoids and synthetic analogues, initial encounter: Secondary | ICD-10-CM

## 2017-11-23 DIAGNOSIS — M25512 Pain in left shoulder: Secondary | ICD-10-CM

## 2017-11-23 DIAGNOSIS — M87112 Osteonecrosis due to drugs, left shoulder: Secondary | ICD-10-CM

## 2017-11-23 DIAGNOSIS — G8929 Other chronic pain: Secondary | ICD-10-CM

## 2017-11-23 NOTE — Addendum Note (Signed)
Addended by: Pamella Pert on: 11/23/2017 10:01 AM   Modules accepted: Orders

## 2017-11-23 NOTE — Progress Notes (Addendum)
Post-Op Visit Note   Patient: Taylor Schroeder           Date of Birth: [birth year]           MRN: [id number] Visit Date: 11/23/2017 PCP: Janith Lima, MD   Assessment & Plan: Postop right total knee arthroplasty.  He had an episode where his knee hyperextended due to quad weakness.  His mother recently had knee surgery he has been trying to help her.  Is doing therapy only once a week and needs to do more quad strengthening exercises to help with symptoms.  He is having problems getting from sitting to standing due to quad weakness and is having increasing shoulder pain in the left shoulder where he has avascular necrosis with large spurs.  Left shoulder wakes him up at night he has large 1-2 cm spurs and collapse of the head.  MRI scan ordered to make sure he has intact rotator cuff.  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  Chief Complaint:  Chief Complaint  Patient presents with  . Right Knee - Routine Post Op    09/14/17 right total knee replacement    Visit Diagnoses:  1. 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 Status post total left knee replacement     Plan: Work slip given no work times 1 month.  We will proceed with MRI scan left shoulder for evaluation of his avascular necrosis to make sure he has an intact rotator cuff.  We discussed left total shoulder arthroplasty today.  Follow-Up Instructions: Return in about 1 month (around 12/21/2017).   Orders:  No orders of the defined types were placed in this encounter.  No orders of the defined types were placed in this encounter.   Imaging: No results found.  PMFS History: Patient Active Problem List   Diagnosis Date Noted  . Steroid-induced avascular necrosis of left shoulder ([place]) 11/23/2017  . Status post total left knee replacement 11/23/2017  . Arthritis of right knee 09/14/2017  . 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 Sleep apnea  03/08/2017  . Hyperlipidemia with target LDL less than 100 12/22/2016  . Primary erectile dysfunction 11/18/2014  . Essential hypertension 07/15/2014  . DDD (degenerative disc disease), cervical 10/03/2013  . Esophageal ring, acquired 07/12/2013  . Crohn's ileitis - working dx 06/01/2013  . BPH (benign prostatic hyperplasia) 04/18/2013  . Pernicious anemia-B12 deficiency 04/18/2013  . Severe obesity (BMI >= 40) ([place]) 12/21/2012  . GERD (gastroesophageal reflux disease) 12/21/2012  . Hyperglycemia 08/29/2012  . Routine general medical examination at a health care facility 08/29/2012   Past Medical History:  Diagnosis Date  . Arthritis    "qwhere" (02/03/2017)  . BPH (benign prostatic hyperplasia)   . Childhood asthma    "as a baby"  . Coronary artery disease Non-obstructive   a. nonobs cath in 2006;  b. 01/2013 Cath: LM nl, LAD 106m, LCX nl, RCA non-dom, nl. EF 60-65%.  . Crohn's ileitis - working dx 06/01/2013   "a mild case of it"  . Esophageal ring, acquired 07/12/2013  . GERD (gastroesophageal reflux disease)   . H/O hiatal hernia    "think so" (02/06/2014)  . Headache    "resolved w/BP control in 11/2015" (02/03/2017)  . High cholesterol   . Hypertension   . Obesity   . Pernicious anemia-B12 deficiency 04/18/2013  . PONV (postoperative nausea and vomiting) 02/03/2017; 08/2016  . Sleep apnea  Family History  Problem Relation Age of Onset  . Hypertension Mother   . Cancer Father        type unknown  . COPD Other   . Emphysema Maternal Grandmother   . Emphysema Maternal Grandfather   . Other Paternal Grandmother        spinal cancer  . Alcohol abuse Neg Hx   . Diabetes Neg Hx   . Early death Neg Hx   . Heart disease Neg Hx   . Hyperlipidemia Neg Hx   . Kidney disease Neg Hx   . Stroke Neg Hx   . Colon cancer Neg Hx   . Stomach cancer Neg Hx   . Esophageal cancer Neg Hx   . Rectal cancer Neg Hx   . Liver cancer Neg Hx     Past Surgical History:  Procedure  Laterality Date  . APPENDECTOMY    . CARDIAC CATHETERIZATION  01/2013   "results were clear"  . CARPAL TUNNEL RELEASE Right   . CHOLECYSTECTOMY N/A 02/03/2017   Procedure: LAPAROSCOPIC CHOLECYSTECTOMY;  Surgeon: Greer Pickerel, MD;  Location: [place];  Service: General;  Laterality: N/A;  . COLONOSCOPY  ~ 2014  . ESOPHAGOGASTRODUODENOSCOPY  ~ 2014  . INGUINAL HERNIA REPAIR  1966   "? side"  . KNEE ARTHROPLASTY Left 02/06/2014   Procedure: COMPUTER ASSISTED TOTAL KNEE ARTHROPLASTY;  Surgeon: [name], MD;  Location: [place];  Service: Orthopedics;  Laterality: Left;  Cemented Left Total Knee Arthroplasty  . KNEE ARTHROSCOPY Left 08/2016   scar tissue cleaned out  . LAPAROSCOPIC CHOLECYSTECTOMY  02/03/2017  . LEFT HEART CATHETERIZATION WITH CORONARY ANGIOGRAM N/A 01/29/2013   Procedure: LEFT HEART CATHETERIZATION WITH CORONARY ANGIOGRAM;  Surgeon: [place] Headings, MD;  [facility];  Service: Cardiovascular;  Laterality: N/A;  . MULTIPLE TOOTH EXTRACTIONS  09/2016  . SHOULDER ARTHROSCOPY Left 03/12/2013  . TOTAL KNEE ARTHROPLASTY Left 02/06/2014  . TOTAL KNEE ARTHROPLASTY Right 09/14/2017  . TOTAL KNEE ARTHROPLASTY Right 09/14/2017   Procedure: RIGHT TOTAL KNEE ARTHROPLASTY;  Surgeon: [name], MD;  Location: [place];  Service: Orthopedics;  Laterality: Right;   Social History   Occupational History  . Occupation: MAINTENANCE    Employer: [employer]  Tobacco Use  . Smoking status: Former Smoker    Packs/day: 2.00    Years: 30.00    Pack years: 60.00    Types: Cigarettes    Last attempt to quit: 08/29/2008    Years since quitting: 9.2  . Smokeless tobacco: Never Used  Substance and Sexual Activity  . Alcohol use: No    Comment: 02/03/2017 "maybe 12 pack of beer all at one time but only once/year"; 02/06/2014 "couple beers a couple times/month"  . Drug use: No  . Sexual activity: Yes

## 2017-11-24 ENCOUNTER — Telehealth (INDEPENDENT_AMBULATORY_CARE_PROVIDER_SITE_OTHER): Payer: Self-pay | Admitting: Orthopaedic Surgery

## 2017-11-24 ENCOUNTER — Encounter (INDEPENDENT_AMBULATORY_CARE_PROVIDER_SITE_OTHER): Payer: Self-pay | Admitting: Orthopaedic Surgery

## 2017-11-24 ENCOUNTER — Ambulatory Visit: Payer: BC Managed Care – PPO | Admitting: Physical Therapy

## 2017-11-24 NOTE — Telephone Encounter (Signed)
Patient sent My Chart message as well. Message has been sent to Dr. Lorin Mercy.

## 2017-11-24 NOTE — Telephone Encounter (Signed)
Corrected. My Chart message sent back to patient to advise.

## 2017-11-24 NOTE — Telephone Encounter (Signed)
Pt called and would like [name] to change his progress note for 11/23/17.Progress note stated his left knee and pt needs it to say right knee for post op instead of left knee.

## 2017-12-06 ENCOUNTER — Ambulatory Visit
Admission: RE | Admit: 2017-12-06 | Discharge: 2017-12-06 | Disposition: A | Discharge status: Home / Self Care | Payer: BC Managed Care – PPO | Source: Ambulatory Visit | Attending: Orthopaedic Surgery | Admitting: Orthopaedic Surgery

## 2017-12-06 DIAGNOSIS — G8929 Other chronic pain: Secondary | ICD-10-CM

## 2017-12-06 DIAGNOSIS — M25512 Pain in left shoulder: Principal | ICD-10-CM

## 2017-12-19 ENCOUNTER — Telehealth: Payer: Self-pay

## 2017-12-19 DIAGNOSIS — I10 Essential (primary) hypertension: Secondary | ICD-10-CM

## 2017-12-19 MED ORDER — TELMISARTAN 80 MG PO TABS: 80.0000 mg | ORAL_TABLET | Freq: Every day | ORAL | 0 refills | Status: DC

## 2017-12-19 NOTE — Telephone Encounter (Signed)
erx for telmisartan

## 2017-12-19 NOTE — Telephone Encounter (Signed)
Rf rq for telmisartan 80 mg tablet  Pt needs an appt before I can send in refills.

## 2017-12-19 NOTE — Telephone Encounter (Signed)
Patient has made an appointment for 12/21/17 @ 11:30am.

## 2017-12-20 ENCOUNTER — Other Ambulatory Visit: Payer: Self-pay | Admitting: Internal Medicine

## 2017-12-20 DIAGNOSIS — I10 Essential (primary) hypertension: Secondary | ICD-10-CM

## 2017-12-20 MED ORDER — TELMISARTAN 80 MG PO TABS: 80.0000 mg | ORAL_TABLET | Freq: Every day | ORAL | 1 refills | Status: DC

## 2017-12-21 ENCOUNTER — Encounter (INDEPENDENT_AMBULATORY_CARE_PROVIDER_SITE_OTHER): Payer: Self-pay | Admitting: Orthopaedic Surgery

## 2017-12-21 ENCOUNTER — Encounter: Payer: Self-pay | Admitting: Internal Medicine

## 2017-12-21 ENCOUNTER — Ambulatory Visit: Payer: BC Managed Care – PPO | Admitting: Internal Medicine

## 2017-12-21 ENCOUNTER — Other Ambulatory Visit (INDEPENDENT_AMBULATORY_CARE_PROVIDER_SITE_OTHER): Payer: BC Managed Care – PPO

## 2017-12-21 ENCOUNTER — Ambulatory Visit (INDEPENDENT_AMBULATORY_CARE_PROVIDER_SITE_OTHER): Payer: BC Managed Care – PPO | Admitting: Orthopaedic Surgery

## 2017-12-21 VITALS — BP 154/102 | HR 80 | Temp 98.1°F | Resp 16 | Ht 69.0 in | Wt 261.2 lb

## 2017-12-21 VITALS — BP 147/97 | HR 87

## 2017-12-21 DIAGNOSIS — N401 Enlarged prostate with lower urinary tract symptoms: Secondary | ICD-10-CM

## 2017-12-21 DIAGNOSIS — Z0000 Encounter for general adult medical examination without abnormal findings: Secondary | ICD-10-CM

## 2017-12-21 DIAGNOSIS — M19012 Primary osteoarthritis, left shoulder: Secondary | ICD-10-CM

## 2017-12-21 DIAGNOSIS — E785 Hyperlipidemia, unspecified: Secondary | ICD-10-CM

## 2017-12-21 DIAGNOSIS — Z96652 Presence of left artificial knee joint: Secondary | ICD-10-CM | POA: Diagnosis not present

## 2017-12-21 DIAGNOSIS — I10 Essential (primary) hypertension: Secondary | ICD-10-CM

## 2017-12-21 DIAGNOSIS — Z96651 Presence of right artificial knee joint: Secondary | ICD-10-CM | POA: Diagnosis not present

## 2017-12-21 DIAGNOSIS — R739 Hyperglycemia, unspecified: Secondary | ICD-10-CM

## 2017-12-21 DIAGNOSIS — D510 Vitamin B12 deficiency anemia due to intrinsic factor deficiency: Secondary | ICD-10-CM

## 2017-12-21 DIAGNOSIS — E559 Vitamin D deficiency, unspecified: Secondary | ICD-10-CM | POA: Diagnosis not present

## 2017-12-21 LAB — CBC WITH DIFFERENTIAL/PLATELET
Basophils Absolute: 0.1 10*3/uL (ref 0.0–0.1)
Basophils Relative: 0.9 % (ref 0.0–3.0)
EOS PCT: 2.8 % (ref 0.0–5.0)
Eosinophils Absolute: 0.2 10*3/uL (ref 0.0–0.7)
HCT: 41.5 % (ref 39.0–52.0)
Hemoglobin: 14.2 g/dL (ref 13.0–17.0)
LYMPHS ABS: 2.4 10*3/uL (ref 0.7–4.0)
Lymphocytes Relative: 34.9 % (ref 12.0–46.0)
MCHC: 34.2 g/dL (ref 30.0–36.0)
MCV: 87 fl (ref 78.0–100.0)
MONO ABS: 0.7 10*3/uL (ref 0.1–1.0)
MONOS PCT: 9.7 % (ref 3.0–12.0)
NEUTROS PCT: 51.7 % (ref 43.0–77.0)
Neutro Abs: 3.6 10*3/uL (ref 1.4–7.7)
Platelets: 251 10*3/uL (ref 150.0–400.0)
RBC: 4.76 Mil/uL (ref 4.22–5.81)
RDW: 14.4 % (ref 11.5–15.5)
WBC: 6.9 10*3/uL (ref 4.0–10.5)

## 2017-12-21 LAB — LIPID PANEL
CHOL/HDL RATIO: 4
Cholesterol: 191 mg/dL (ref 0–200)
HDL: 50.5 mg/dL (ref >39.00)
LDL CALC: 110 mg/dL — AB (ref 0–99)
NONHDL: 140.5
Triglycerides: 153 mg/dL — ABNORMAL HIGH (ref 0.0–149.0)
VLDL: 30.6 mg/dL (ref 0.0–40.0)

## 2017-12-21 LAB — COMPREHENSIVE METABOLIC PANEL
ALK PHOS: 69 U/L (ref 39–117)
ALT: 44 U/L (ref 0–53)
AST: 26 U/L (ref 0–37)
Albumin: 4.1 g/dL (ref 3.5–5.2)
BUN: 21 mg/dL (ref 6–23)
CHLORIDE: 103 meq/L (ref 96–112)
CO2: 27 meq/L (ref 19–32)
Calcium: 9.7 mg/dL (ref 8.4–10.5)
Creatinine, Ser: 0.94 mg/dL (ref 0.40–1.50)
GFR: 89.34 mL/min (ref >60.00)
GLUCOSE: 105 mg/dL — AB (ref 70–99)
POTASSIUM: 3.9 meq/L (ref 3.5–5.1)
SODIUM: 139 meq/L (ref 135–145)
Total Bilirubin: 0.6 mg/dL (ref 0.2–1.2)
Total Protein: 7 g/dL (ref 6.0–8.3)

## 2017-12-21 LAB — HEMOGLOBIN A1C: Hgb A1c MFr Bld: 5.4 % (ref 4.6–6.5)

## 2017-12-21 LAB — VITAMIN D 25 HYDROXY (VIT D DEFICIENCY, FRACTURES): VITD: 17.04 ng/mL — ABNORMAL LOW (ref 30.00–100.00)

## 2017-12-21 LAB — PSA: PSA: 2.67 ng/mL (ref 0.10–4.00)

## 2017-12-21 LAB — TSH: TSH: 2.25 u[IU]/mL (ref 0.35–4.50)

## 2017-12-21 MED ORDER — DUTASTERIDE 0.5 MG PO CAPS: 0.5000 mg | ORAL_CAPSULE | Freq: Every day | ORAL | 1 refills | Status: DC

## 2017-12-21 MED ORDER — CHOLECALCIFEROL 50 MCG (2000 UT) PO TABS
1.0000 | ORAL_TABLET | Freq: Every day | ORAL | 1 refills | Status: DC
Start: 2017-12-21 — End: 2018-07-11

## 2017-12-21 MED ORDER — ALFUZOSIN HCL ER 10 MG PO TB24: 10.0000 mg | ORAL_TABLET | Freq: Every day | ORAL | 1 refills | Status: DC

## 2017-12-21 NOTE — Progress Notes (Signed)
Office Visit Note   Patient: Taylor Schroeder           Date of Birth: 07-May-1966           MRN: [id number] Visit Date: 12/21/2017              Requested by: Janith Lima, MD [address] [place], [place] [address] PCP: Janith Lima, MD   Assessment & Plan: Visit Diagnoses:  1. Primary osteoarthritis of left shoulder   2. Status post total left knee replacement   3. Status post right knee replacement     Plan: Patient is ready to resume work on 12/26/2017.  He has left shoulder osteoarthritis with multiple loose bodies flattening of the head.  Some of the changes may have suggested earlier AVN.  He has some periscapular atrophy and some degenerative changes in the rotator cuff without full-thickness tear.  He wants to wait until fall for a total shoulder arthroplasty.  He can continue ice.  He is looking at other easier job options in the future.  He will continue to work out on the gym and work on weight loss.  I can recheck him in 2 months.  Follow-Up Instructions: Return in about 2 months (around 02/20/2018).   Orders:  No orders of the defined types were placed in this encounter.  No orders of the defined types were placed in this encounter.     Procedures: No procedures performed   Clinical Data: No additional findings.   Subjective: Chief Complaint  Patient presents with  . Right Knee - Follow-up  . Left Shoulder - Pain, Follow-up    MRI review    HPI follow-up bilateral total knee arthroplasties.  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  He can get up from a chair is going to the gym using stairmaster, elliptical, leg machines.  Continues to have ongoing left shoulder problems and is here for MRI review.  Review of Systems 14 point review the review of systems updated and unchanged from December 2018 surgery on his right knee other than as mentioned in HPI.   Objective: Vital Signs: BP (!) 147/97   Pulse 87   Physical Exam  Constitutional:  He is oriented to person, place, and time. He appears well-developed and well-nourished.  HENT:  Head: Normocephalic and atraumatic.  Eyes: Pupils are equal, round, and reactive to light. EOM are normal.  Neck: No tracheal deviation present. No thyromegaly present.  Cardiovascular: Normal rate.  Pulmonary/Chest: Effort normal. He has no wheezes.  Abdominal: Soft. Bowel sounds are normal.  Neurological: He is alert and oriented to person, place, and time.  Skin: Skin is warm and dry. Capillary refill takes less than 2 seconds.  Psychiatric: He has a normal mood and affect. His behavior is normal. Judgment and thought content normal.    Ortho Exam crepitus with left shoulder range of motion negative drop arm test.  He occasionally has some sharp popping with rotation.  He get his hand to the top of his head.  He is able get from sitting to standing without using his arms.  Well-healed knee incisions.  Resolution of left patellar clunk.  Good extension good quad strength collateral ligaments are stable.  Specialty Comments:  No specialty comments available.  Imaging: CLINICAL DATA:  Chronic left shoulder pain worse since a fall in December 2018.  EXAM: MRI OF THE LEFT SHOULDER WITHOUT CONTRAST  TECHNIQUE: Multiplanar, multisequence MR imaging of the shoulder was  performed. No intravenous contrast was administered.  COMPARISON:  None.  FINDINGS: Rotator cuff: Moderate rotator cuff tendinopathy/tendinosis with interstitial tears mainly along the footprint attachment region of the supraspinatus tendon. No full-thickness retracted rotator cuff tear.  Muscles: Mild diffuse fatty change involving the shoulder musculature.  Biceps long head:  Intact  Acromioclavicular Joint: Mild degenerative changes. Type 1-2 acromion. No lateral downsloping. Undersurface spurring. Os acromial noted.  Glenohumeral Joint: Severe degenerative changes with marked joint space narrowing,  full-thickness cartilage loss, subchondral cystic change, osteophytic spurring, slight widening and thinning of the glenoid, moderate-sized joint effusion and loose ossified bodies.  Labrum:  Degenerated and likely torn.  Bones:  No acute bony abnormality.  Other: No subacromial/subdeltoid fluid collections to suggest bursitis.  IMPRESSION: 1. Severe glenohumeral joint degenerative changes. 2. Moderate rotator cuff tendinopathy/tendinosis but no full-thickness retracted tear. 3. Degenerated and likely torn glenoid labrum. 4. Intact long head biceps tendon. 5. Os acromiale.   Electronically Signed   By: Marijo Sanes M.D.   On: 12/06/2017 09:37   PMFS History: Patient Active Problem List   Diagnosis Date Noted  . Status post right knee replacement 11/23/2017  . 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 Sleep apnea 03/08/2017  . Hyperlipidemia with target LDL less than 100 12/22/2016  . Primary erectile dysfunction 11/18/2014  . Essential hypertension 07/15/2014  . DDD (degenerative disc disease), cervical 10/03/2013  . Esophageal ring, acquired 07/12/2013  . Crohn's ileitis - working dx 06/01/2013  . BPH (benign prostatic hyperplasia) 04/18/2013  . Pernicious anemia-B12 deficiency 04/18/2013  . Severe obesity (BMI >= 40) ([place]) 12/21/2012  . GERD (gastroesophageal reflux disease) 12/21/2012  . Hyperglycemia 08/29/2012  . Routine general medical examination at a health care facility 08/29/2012   Past Medical History:  Diagnosis Date  . Arthritis    "qwhere" (02/03/2017)  . BPH (benign prostatic hyperplasia)   . Childhood asthma    "as a baby"  . Coronary artery disease Non-obstructive   a. nonobs cath in 2006;  b. 01/2013 Cath: LM nl, LAD 55m, LCX nl, RCA non-dom, nl. EF 60-65%.  . Crohn's ileitis - working dx 06/01/2013   "a mild case of it"  . Esophageal ring, acquired 07/12/2013  . GERD (gastroesophageal reflux disease)   . H/O hiatal hernia      "think so" (02/06/2014)  . Headache    "resolved w/BP control in 11/2015" (02/03/2017)  . High cholesterol   . Hypertension   . Obesity   . Pernicious anemia-B12 deficiency 04/18/2013  . PONV (postoperative nausea and vomiting) 02/03/2017; 08/2016  . Sleep apnea     Family History  Problem Relation Age of Onset  . Hypertension Mother   . Cancer Father        type unknown  . COPD Other   . Emphysema Maternal Grandmother   . Emphysema Maternal Grandfather   . Other Paternal Grandmother        spinal cancer  . Alcohol abuse Neg Hx   . Diabetes Neg Hx   . Early death Neg Hx   . Heart disease Neg Hx   . Hyperlipidemia Neg Hx   . Kidney disease Neg Hx   . Stroke Neg Hx   . Colon cancer Neg Hx   . Stomach cancer Neg Hx   . Esophageal cancer Neg Hx   . Rectal cancer Neg Hx   . Liver cancer Neg Hx     Past Surgical History:  Procedure Laterality Date  . APPENDECTOMY    .  CARDIAC CATHETERIZATION  01/2013   "results were clear"  . CARPAL TUNNEL RELEASE Right   . CHOLECYSTECTOMY N/A 02/03/2017   Procedure: LAPAROSCOPIC CHOLECYSTECTOMY;  Surgeon: Greer Pickerel, MD;  Location: [place];  Service: General;  Laterality: N/A;  . COLONOSCOPY  ~ 2014  . ESOPHAGOGASTRODUODENOSCOPY  ~ 2014  . INGUINAL HERNIA REPAIR  1966   "? side"  . KNEE ARTHROPLASTY Left 02/06/2014   Procedure: COMPUTER ASSISTED TOTAL KNEE ARTHROPLASTY;  Surgeon: [name], MD;  Location: [place];  Service: Orthopedics;  Laterality: Left;  Cemented Left Total Knee Arthroplasty  . KNEE ARTHROSCOPY Left 08/2016   scar tissue cleaned out  . LAPAROSCOPIC CHOLECYSTECTOMY  02/03/2017  . LEFT HEART CATHETERIZATION WITH CORONARY ANGIOGRAM N/A 01/29/2013   Procedure: LEFT HEART CATHETERIZATION WITH CORONARY ANGIOGRAM;  Surgeon: [place] Headings, MD;  Location: [facility] CATH LAB;  Service: Cardiovascular;  Laterality: N/A;  . MULTIPLE TOOTH EXTRACTIONS  09/2016  . SHOULDER ARTHROSCOPY Left 03/12/2013  . TOTAL KNEE ARTHROPLASTY Left  02/06/2014  . TOTAL KNEE ARTHROPLASTY Right 09/14/2017  . TOTAL KNEE ARTHROPLASTY Right 09/14/2017   Procedure: RIGHT TOTAL KNEE ARTHROPLASTY;  Surgeon: [name], MD;  Location: [facility];  Service: Orthopedics;  Laterality: Right;   Social History   Occupational History  . Occupation: MAINTENANCE    Employer: [employer]  Tobacco Use  . Smoking status: Former Smoker    Packs/day: 2.00    Years: 30.00    Pack years: 60.00    Types: Cigarettes    Last attempt to quit: 08/29/2008    Years since quitting: 9.3  . Smokeless tobacco: Never Used  Substance and Sexual Activity  . Alcohol use: No    Comment: 02/03/2017 "maybe 12 pack of beer all at one time but only once/year"; 02/06/2014 "couple beers a couple times/month"  . Drug use: No  . Sexual activity: Yes

## 2017-12-21 NOTE — Patient Instructions (Signed)

## 2017-12-21 NOTE — Progress Notes (Signed)
Subjective:  Patient ID: Taylor Schroeder, male    DOB: 1966/07/06  Age: 52 y.o. MRN: [id number]  CC: Annual Exam; Anemia; Hyperlipidemia; and Hypertension   HPI DEONTRAE DRINKARD presents for a CPX.  He complains of worsening difficulty with urination over the last year.  He gets up about 2 or 3 times a night to urinate.  He complains that his urine stream is weak and he straina and has some dribbling.  He thinks his blood pressure has been well controlled but he does not monitor it very very frequently.  He denies any recent episodes of headache, blurred vision, chest pain, shortness of breath, palpitations, edema, or fatigue.  He has been able to lose 9 pounds over the last month with lifestyle modifications.  Outpatient Medications Prior to Visit  Medication Sig Dispense Refill  . aspirin EC 325 MG EC tablet Take 1 tablet (325 mg total) by mouth daily with breakfast. 30 tablet 0  . pantoprazole (PROTONIX) 40 MG tablet TAKE 1 TABLET (40 MG TOTAL) BY MOUTH DAILY BEFORE BREAKFAST. 90 tablet 3  . telmisartan (MICARDIS) 80 MG tablet Take 1 tablet (80 mg total) by mouth daily. 90 tablet 1  . ibuprofen (ADVIL,MOTRIN) 200 MG tablet Take 4 tablets (800 mg total) by mouth 2 (two) times daily before a meal.  0  . methocarbamol (ROBAXIN) 500 MG tablet Take 1 tablet (500 mg total) by mouth every 6 (six) hours as needed for muscle spasms. 40 tablet 1  . rosuvastatin (CRESTOR) 10 MG tablet Take 1 tablet (10 mg total) by mouth daily. 90 tablet 3  . oxyCODONE-acetaminophen (PERCOCET/ROXICET) 5-325 MG tablet 1 to 2 po q 4-6hrs prn pain 30 tablet 0  . oxyCODONE-acetaminophen (ROXICET) 5-325 MG tablet Take 1-2 tablets by mouth every 4 (four) hours as needed for severe pain. (Patient not taking: Reported on 12/21/2017) 50 tablet 0   No facility-administered medications prior to visit.     ROS Review of Systems  Constitutional: Negative.  Negative for appetite change, diaphoresis, fatigue and unexpected  weight change.  HENT: Negative.  Negative for trouble swallowing.   Eyes: Negative for visual disturbance.  Respiratory: Negative for cough, chest tightness and shortness of breath.   Cardiovascular: Negative for chest pain, palpitations and leg swelling.  Gastrointestinal: Negative for abdominal pain, constipation, diarrhea, nausea and vomiting.  Endocrine: Negative.   Genitourinary: Positive for difficulty urinating. Negative for decreased urine volume, dysuria, flank pain, frequency, testicular pain and urgency.  Musculoskeletal: Negative.  Negative for arthralgias, back pain, myalgias and neck pain.  Skin: Negative.  Negative for color change, pallor and rash.  Allergic/Immunologic: Negative.   Neurological: Negative.  Negative for dizziness, seizures, weakness, light-headedness and headaches.  Hematological: Negative for adenopathy. Does not bruise/bleed easily.  Psychiatric/Behavioral: Negative.     Objective:  BP (!) 154/102 (BP Location: Left Arm, Patient Position: Sitting, Cuff Size: Large)   Pulse 80   Temp 98.1 F (36.7 C) (Oral)   Resp 16   Ht 5\' 9"  (1.753 m)   Wt 261 lb 3 oz (118.5 kg)   SpO2 95%   BMI 38.57 kg/m   BP Readings from Last 3 Encounters:  12/21/17 (!) 154/102  12/21/17 (!) 147/97  11/23/17 (!) 146/95    Wt Readings from Last 3 Encounters:  12/21/17 261 lb 3 oz (118.5 kg)  11/23/17 270 lb (122.5 kg)  09/14/17 270 lb 12.8 oz (122.8 kg)    Physical Exam  Constitutional: He is oriented to  person, place, and time. No distress.  HENT:  Mouth/Throat: Oropharynx is clear and moist. No oropharyngeal exudate.  Eyes: Conjunctivae are normal. Right eye exhibits no discharge. Left eye exhibits no discharge. No scleral icterus.  Neck: Normal range of motion. Neck supple. No JVD present. No thyromegaly present.  Cardiovascular: Normal rate, regular rhythm and normal heart sounds. Exam reveals no gallop and no friction rub.  No murmur heard. Pulmonary/Chest:  Effort normal and breath sounds normal. No respiratory distress. He has no wheezes. He has no rales.  Abdominal: Soft. Bowel sounds are normal. He exhibits no distension and no mass. There is no tenderness. There is no rebound and no guarding. Hernia confirmed negative in the right inguinal area and confirmed negative in the left inguinal area.  Genitourinary: Rectum normal, testes normal and penis normal. Rectal exam shows no external hemorrhoid, no internal hemorrhoid, no fissure, no mass, no tenderness, anal tone normal and guaiac negative stool. Prostate is enlarged (2+ smooth symm BPH). Prostate is not tender. Right testis shows no mass, no swelling and no tenderness. Left testis shows no mass, no swelling and no tenderness. Circumcised. No penile erythema or penile tenderness. No discharge found.  Musculoskeletal: Normal range of motion. He exhibits no edema, tenderness or deformity.  Lymphadenopathy:    He has no cervical adenopathy.       Right: No inguinal adenopathy present.       Left: No inguinal adenopathy present.  Neurological: He is alert and oriented to person, place, and time.  Skin: Skin is warm and dry. No rash noted. He is not diaphoretic. No erythema. No pallor.  Psychiatric: He has a normal mood and affect. His behavior is normal. Judgment and thought content normal.  Vitals reviewed.   Lab Results  Component Value Date   WBC 6.9 12/21/2017   HGB 14.2 12/21/2017   HCT 41.5 12/21/2017   PLT 251.0 12/21/2017   GLUCOSE 105 (H) 12/21/2017   CHOL 191 12/21/2017   TRIG 153.0 (H) 12/21/2017   HDL 50.50 12/21/2017   LDLDIRECT 144.0 12/21/2016   LDLCALC 110 (H) 12/21/2017   ALT 44 12/21/2017   AST 26 12/21/2017   NA 139 12/21/2017   K 3.9 12/21/2017   CL 103 12/21/2017   CREATININE 0.94 12/21/2017   BUN 21 12/21/2017   CO2 27 12/21/2017   TSH 2.25 12/21/2017   PSA 2.67 12/21/2017   INR 0.95 09/07/2017   HGBA1C 5.4 12/21/2017    Mr Shoulder Left W/o  Contrast  Result Date: 12/06/2017 CLINICAL DATA:  Chronic left shoulder pain worse since a fall in December 2018. EXAM: MRI OF THE LEFT SHOULDER WITHOUT CONTRAST TECHNIQUE: Multiplanar, multisequence MR imaging of the shoulder was performed. No intravenous contrast was administered. COMPARISON:  None. FINDINGS: Rotator cuff: Moderate rotator cuff tendinopathy/tendinosis with interstitial tears mainly along the footprint attachment region of the supraspinatus tendon. No full-thickness retracted rotator cuff tear. Muscles: Mild diffuse fatty change involving the shoulder musculature. Biceps long head:  Intact Acromioclavicular Joint: Mild degenerative changes. Type 1-2 acromion. No lateral downsloping. Undersurface spurring. Os acromial noted. Glenohumeral Joint: Severe degenerative changes with marked joint space narrowing, full-thickness cartilage loss, subchondral cystic change, osteophytic spurring, slight widening and thinning of the glenoid, moderate-sized joint effusion and loose ossified bodies. Labrum:  Degenerated and likely torn. Bones:  No acute bony abnormality. Other: No subacromial/subdeltoid fluid collections to suggest bursitis. IMPRESSION: 1. Severe glenohumeral joint degenerative changes. 2. Moderate rotator cuff tendinopathy/tendinosis but no full-thickness retracted tear. 3.  Degenerated and likely torn glenoid labrum. 4. Intact long head biceps tendon. 5. Os acromiale. Electronically Signed   By: Marijo Sanes M.D.   On: 12/06/2017 09:37    Assessment & Plan:   Neziah was seen today for annual exam, anemia, hyperlipidemia and hypertension.  Diagnoses and all orders for this visit:  Essential hypertension- His blood pressure is not adequately well controlled.  I will treat the vitamin D deficiency.  He agrees to be more compliant with the ARB and to work on his lifestyle modifications.  I will recheck his blood pressure in about 3 months. -     Comprehensive metabolic panel; Future -      TSH; Future -     VITAMIN D 25 Hydroxy (Vit-D Deficiency, Fractures); Future  Routine general medical examination at a health care facility- Exam completed, labs reviewed, vaccines reviewed, screening for colon cancer is up-to-date, patient education material was given. -     Lipid panel; Future -     PSA; Future  Pernicious anemia-B12 deficiency- His H&H are normal now.  He will continue vitamin B12 replacement therapy. -     CBC with Differential/Platelet; Future  Hyperglycemia -     Comprehensive metabolic panel; Future -     Hemoglobin A1c; Future  Benign prostatic hyperplasia with lower urinary tract symptoms, symptom details unspecified- His PSA has not significantly increased over the last year so I am not concerned about prostate cancer.  He is symptomatic so I have asked him to start taking a DHT inhibitor as well as a peripheral alpha blocker. -     alfuzosin (UROXATRAL) 10 MG 24 hr tablet; Take 1 tablet (10 mg total) by mouth daily with breakfast. -     dutasteride (AVODART) 0.5 MG capsule; Take 1 capsule (0.5 mg total) by mouth daily.  Vitamin D deficiency -     Cholecalciferol 2000 units TABS; Take 1 tablet (2,000 Units total) by mouth daily.  Hyperlipidemia with target LDL less than 100- He has achieved his LDL goal and is doing well on the statin.   I have discontinued Zenovia Jarred. Deller "Darren"'s rosuvastatin, methocarbamol, oxyCODONE-acetaminophen, ibuprofen, and oxyCODONE-acetaminophen. I am also having him start on alfuzosin, dutasteride, and Cholecalciferol. Additionally, I am having him maintain his pantoprazole, aspirin, and telmisartan.  Meds ordered this encounter  Medications  . alfuzosin (UROXATRAL) 10 MG 24 hr tablet    Sig: Take 1 tablet (10 mg total) by mouth daily with breakfast.    Dispense:  90 tablet    Refill:  1  . dutasteride (AVODART) 0.5 MG capsule    Sig: Take 1 capsule (0.5 mg total) by mouth daily.    Dispense:  90 capsule    Refill:  1  .  Cholecalciferol 2000 units TABS    Sig: Take 1 tablet (2,000 Units total) by mouth daily.    Dispense:  90 tablet    Refill:  1     Follow-up: Return in about 6 months (around 06/23/2018).  Scarlette Calico, MD

## 2018-02-21 ENCOUNTER — Telehealth (INDEPENDENT_AMBULATORY_CARE_PROVIDER_SITE_OTHER): Payer: Self-pay | Admitting: Orthopaedic Surgery

## 2018-02-21 NOTE — Telephone Encounter (Signed)
I called and spoke with patient. He feels that knee is unstable and feels a "popping" in there. He is concerned for loosening hardware. I offered appt this afternoon, but patient is unable to get off of work. Work in appt for Friday made. Copay can be billed per Abigail Butts.

## 2018-02-21 NOTE — Telephone Encounter (Signed)
Patient called and stated having pain in right knee nwhere he had surgery. Offered an appt and patient declined stating hard to get off.   Just wants to speak to someone in regard to knee  Please call patient to advise

## 2018-02-24 ENCOUNTER — Encounter (INDEPENDENT_AMBULATORY_CARE_PROVIDER_SITE_OTHER): Payer: Self-pay | Admitting: Orthopaedic Surgery

## 2018-02-24 ENCOUNTER — Ambulatory Visit (INDEPENDENT_AMBULATORY_CARE_PROVIDER_SITE_OTHER): Payer: BC Managed Care – PPO | Admitting: Orthopaedic Surgery

## 2018-02-24 ENCOUNTER — Ambulatory Visit (INDEPENDENT_AMBULATORY_CARE_PROVIDER_SITE_OTHER): Payer: BC Managed Care – PPO

## 2018-02-24 VITALS — BP 142/80 | HR 86 | Ht 69.0 in | Wt 255.0 lb

## 2018-02-24 DIAGNOSIS — M7631 Iliotibial band syndrome, right leg: Secondary | ICD-10-CM | POA: Insufficient documentation

## 2018-02-24 DIAGNOSIS — M25561 Pain in right knee: Secondary | ICD-10-CM

## 2018-02-24 MED ORDER — BUPIVACAINE HCL 0.25 % IJ SOLN
2.0000 mL | INTRAMUSCULAR | Status: AC | PRN
  Administered 2018-02-24: 2 mL via INTRA_ARTICULAR

## 2018-02-24 MED ORDER — METHYLPREDNISOLONE ACETATE 40 MG/ML IJ SUSP
40.0000 mg | INTRAMUSCULAR | Status: AC | PRN
Start: 2018-02-24 — End: 2018-02-24
  Administered 2018-02-24: 40 mg via INTRA_ARTICULAR

## 2018-02-24 MED ORDER — LIDOCAINE HCL 1 % IJ SOLN
0.5000 mL | INTRAMUSCULAR | Status: AC | PRN
  Administered 2018-02-24: .5 mL

## 2018-02-24 NOTE — Progress Notes (Signed)
Office Visit Note   Patient: Taylor Schroeder           Date of Birth: 01/15/1966           MRN: [id number] Visit Date: 02/24/2018              Requested by: Taylor Lima, MD [address] [place], [place] [address] PCP: Taylor Lima, MD   Assessment & Plan: Visit Diagnoses:  1. Acute pain of right knee   2. Iliotibial band syndrome affecting lower leg, right     Plan: Injection performed right knee at the lateral femoral condyle iliotibial band .  He noted relief postinjection.  He will follow-up if he has persistent problems.  We discussed rest ,ice, and trying to avoid doing a lot of crawling as he had previously done.  Follow-Up Instructions: Return if symptoms worsen or fail to improve.   Orders:  Orders Placed This Encounter  Procedures  . XR Knee 1-2 Views Right   No orders of the defined types were placed in this encounter.     Procedures: Large Joint Inj: R knee on 02/24/2018 2:38 PM Indications: pain and joint swelling Details: 22 G 1.5 in needle, lateral approach  Arthrogram: No  Medications: 40 mg methylPREDNISolone acetate 40 MG/ML; 0.5 mL lidocaine 1 %; 2 mL bupivacaine 0.25 % Outcome: tolerated well, no immediate complications Procedure, treatment alternatives, risks and benefits explained, specific risks discussed. Consent was given by the patient. Immediately prior to procedure a time out was called to verify the correct patient, procedure, equipment, support staff and site/side marked as required. Patient was prepped and draped in the usual sterile fashion.       Clinical Data: No additional findings.   Subjective: Chief Complaint  Patient presents with  . Right Knee - Pain    HPI 52 year old male I had to get on his knees when under house to help somebody several weeks ago.  He is noted some increased pain popping in his right knee laterally and points directly over the lateral femoral condyle.  He is able to demonstrate some  popping and states is very painful.  Some pain radiates up to the greater trochanter but primarily his pain is laterally at the femoral condyle and iliotibial band.  He denies fever chills.  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  He has had some limping due to the pain and problems going from sitting to standing.  Review of Systems 14 point review of systems updated unchanged from 09/14/2017 surgery other than as mentioned in HPI.   Objective: Vital Signs: BP (!) 142/80   Pulse 86   Ht 5\' 9"  (1.753 m)   Wt 255 lb (115.7 kg)   BMI 37.66 kg/m   Physical Exam  Constitutional: He is oriented to person, place, and time. He appears well-developed and well-nourished.  HENT:  Head: Normocephalic and atraumatic.  Eyes: Pupils are equal, round, and reactive to light. EOM are normal.  Neck: No tracheal deviation present. No thyromegaly present.  Cardiovascular: Normal rate.  Pulmonary/Chest: Effort normal. He has no wheezes.  Abdominal: Soft. Bowel sounds are normal.  Neurological: He is alert and oriented to person, place, and time.  Skin: Skin is warm and dry. Capillary refill takes less than 2 seconds.  Psychiatric: He has a normal mood and affect. His behavior is normal. Judgment and thought content normal.    Ortho Exam normal hip range of motion well-healed bilateral total knee  arthroplasty incisions.  Exquisite tenderness with palpation over the iliotibial band at the femoral condyle on the right knee with flexion extension he has a palpable pop.  Specialty Comments:  No specialty comments available.  Imaging: Xr Knee 1-2 Views Right  Result Date: 02/24/2018 AP lateral standing x-rays both knees and lateral right knee are obtained and reviewed.  This shows well-positioned total knee arthroplasty without evidence of loosening.  Negative for acute fracture. Impression: Postop total knee arthroplasty satisfactory position.  No acute changes.    PMFS History: Patient  Active Problem List   Diagnosis Date Noted  . Iliotibial band syndrome affecting lower leg, right 02/24/2018  . Vitamin D deficiency 12/21/2017  . 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 Sleep apnea 03/08/2017  . Hyperlipidemia with target LDL less than 100 12/22/2016  . Primary erectile dysfunction 11/18/2014  . Essential hypertension 07/15/2014  . DDD (degenerative disc disease), cervical 10/03/2013  . Esophageal ring, acquired 07/12/2013  . Crohn's ileitis - working dx 06/01/2013  . BPH (benign prostatic hyperplasia) 04/18/2013  . Pernicious anemia-B12 deficiency 04/18/2013  . Severe obesity (BMI >= 40) ([place]) 12/21/2012  . GERD (gastroesophageal reflux disease) 12/21/2012  . Hyperglycemia 08/29/2012  . Routine general medical examination at a health care facility 08/29/2012   Past Medical History:  Diagnosis Date  . Arthritis    "qwhere" (02/03/2017)  . BPH (benign prostatic hyperplasia)   . Childhood asthma    "as a baby"  . Coronary artery disease Non-obstructive   a. nonobs cath in 2006;  b. 01/2013 Cath: LM nl, LAD 52m, LCX nl, RCA non-dom, nl. EF 60-65%.  . Crohn's ileitis - working dx 06/01/2013   "a mild case of it"  . Esophageal ring, acquired 07/12/2013  . GERD (gastroesophageal reflux disease)   . H/O hiatal hernia    "think so" (02/06/2014)  . Headache    "resolved w/BP control in 11/2015" (02/03/2017)  . High cholesterol   . Hypertension   . Obesity   . Pernicious anemia-B12 deficiency 04/18/2013  . PONV (postoperative nausea and vomiting) 02/03/2017; 08/2016  . Sleep apnea     Family History  Problem Relation Age of Onset  . Hypertension Mother   . Cancer Father        type unknown  . COPD Other   . Emphysema Maternal Grandmother   . Emphysema Maternal Grandfather   . Other Paternal Grandmother        spinal cancer  . Alcohol abuse Neg Hx   . Diabetes Neg Hx   . Early death Neg Hx   . Heart disease Neg Hx   . Hyperlipidemia Neg Hx   .  Kidney disease Neg Hx   . Stroke Neg Hx   . Colon cancer Neg Hx   . Stomach cancer Neg Hx   . Esophageal cancer Neg Hx   . Rectal cancer Neg Hx   . Liver cancer Neg Hx     Past Surgical History:  Procedure Laterality Date  . APPENDECTOMY    . CARDIAC CATHETERIZATION  01/2013   "results were clear"  . CARPAL TUNNEL RELEASE Right   . CHOLECYSTECTOMY N/A 02/03/2017   Procedure: LAPAROSCOPIC CHOLECYSTECTOMY;  Surgeon: Greer Pickerel, MD;  Location: [facility];  Service: General;  Laterality: N/A;  . COLONOSCOPY  ~ 2014  . ESOPHAGOGASTRODUODENOSCOPY  ~ 2014  . INGUINAL HERNIA REPAIR  1966   "? side"  . KNEE ARTHROPLASTY Left 02/06/2014   Procedure: COMPUTER ASSISTED TOTAL KNEE  ARTHROPLASTY;  Surgeon: [name], MD;  Location: [facility];  Service: Orthopedics;  Laterality: Left;  Cemented Left Total Knee Arthroplasty  . KNEE ARTHROSCOPY Left 08/2016   scar tissue cleaned out  . LAPAROSCOPIC CHOLECYSTECTOMY  02/03/2017  . LEFT HEART CATHETERIZATION WITH CORONARY ANGIOGRAM N/A 01/29/2013   Procedure: LEFT HEART CATHETERIZATION WITH CORONARY ANGIOGRAM;  Surgeon: Thayer Headings, MD;  Location: [facility] CATH LAB;  Service: Cardiovascular;  Laterality: N/A;  . MULTIPLE TOOTH EXTRACTIONS  09/2016  . SHOULDER ARTHROSCOPY Left 03/12/2013  . TOTAL KNEE ARTHROPLASTY Left 02/06/2014  . TOTAL KNEE ARTHROPLASTY Right 09/14/2017  . TOTAL KNEE ARTHROPLASTY Right 09/14/2017   Procedure: RIGHT TOTAL KNEE ARTHROPLASTY;  Surgeon: [name], MD;  Location: [facility];  Service: Orthopedics;  Laterality: Right;   Social History   Occupational History  . Occupation: MAINTENANCE    Employer: [employer]  Tobacco Use  . Smoking status: Former Smoker    Packs/day: 2.00    Years: 30.00    Pack years: 60.00    Types: Cigarettes    Last attempt to quit: 08/29/2008    Years since quitting: 9.4  . Smokeless tobacco: Never Used  Substance and Sexual Activity  . Alcohol use: No    Comment: 02/03/2017 "maybe 12 pack  of beer all at one time but only once/year"; 02/06/2014 "couple beers a couple times/month"  . Drug use: No  . Sexual activity: Yes

## 2018-03-15 ENCOUNTER — Telehealth (INDEPENDENT_AMBULATORY_CARE_PROVIDER_SITE_OTHER): Payer: Self-pay | Admitting: Orthopaedic Surgery

## 2018-03-15 NOTE — Telephone Encounter (Signed)
Patient called asked if he can get a Rx for pain in his shoulder. Patient said he tried the Diclofenac sodium cream and it helped him sleep better. The number to contact patient is [phone]

## 2018-03-15 NOTE — Telephone Encounter (Signed)
Please advise 

## 2018-03-16 MED ORDER — DICLOFENAC SODIUM 1 % TD GEL: 4.0000 g | Freq: Four times a day (QID) | TRANSDERMAL | 0 refills | Status: DC

## 2018-03-16 NOTE — Telephone Encounter (Signed)
Call in voltaren gel   Apply 4X per day for shoulder arthritis.  4 tubes thanks  [facility] [address].

## 2018-03-16 NOTE — Telephone Encounter (Signed)
Sent to pharmacy 

## 2018-04-11 ENCOUNTER — Encounter: Payer: Self-pay | Admitting: Internal Medicine

## 2018-04-11 ENCOUNTER — Ambulatory Visit: Payer: BC Managed Care – PPO | Admitting: Internal Medicine

## 2018-04-11 VITALS — BP 118/70 | HR 92 | Temp 97.7°F | Resp 16 | Ht 69.0 in | Wt 270.0 lb

## 2018-04-11 DIAGNOSIS — J301 Allergic rhinitis due to pollen: Secondary | ICD-10-CM

## 2018-04-11 DIAGNOSIS — R040 Epistaxis: Secondary | ICD-10-CM | POA: Diagnosis not present

## 2018-04-11 DIAGNOSIS — J0100 Acute maxillary sinusitis, unspecified: Secondary | ICD-10-CM | POA: Diagnosis not present

## 2018-04-11 MED ORDER — METHYLPREDNISOLONE 4 MG PO TBPK: ORAL_TABLET | ORAL | 0 refills | Status: AC

## 2018-04-11 MED ORDER — MUPIROCIN 2 % EX OINT: 1.0000 "application " | TOPICAL_OINTMENT | Freq: Two times a day (BID) | CUTANEOUS | 1 refills | Status: DC

## 2018-04-11 MED ORDER — AMOXICILLIN-POT CLAVULANATE 875-125 MG PO TABS: 1.0000 | ORAL_TABLET | Freq: Two times a day (BID) | ORAL | 0 refills | Status: AC

## 2018-04-11 NOTE — Progress Notes (Signed)
Subjective:  Patient ID: Taylor Schroeder, male    DOB: 1966/02/16  Age: 52 y.o. MRN: [id number]  CC: Sinusitis and Allergic Rhinitis    HPI Taylor Schroeder presents for a 3 week hx of sinusitis.  He complains of a runny nose that produces brown phlegm.  He has felt poorly for about 3 weeks.  He complains of bilateral upper tooth pain, worse on the right than the left.  He has tried to control the symptoms with Sudafed and Flonase nasal spray.  He is not willing to take an oral antihistamine because it dries out his eyes.  Outpatient Medications Prior to Visit  Medication Sig Dispense Refill  . alfuzosin (UROXATRAL) 10 MG 24 hr tablet Take 1 tablet (10 mg total) by mouth daily with breakfast. 90 tablet 1  . aspirin EC 325 MG EC tablet Take 1 tablet (325 mg total) by mouth daily with breakfast. 30 tablet 0  . Cholecalciferol 2000 units TABS Take 1 tablet (2,000 Units total) by mouth daily. 90 tablet 1  . diclofenac sodium (VOLTAREN) 1 % GEL Apply 4 g topically 4 (four) times daily. 4 Tube 0  . dutasteride (AVODART) 0.5 MG capsule Take 1 capsule (0.5 mg total) by mouth daily. 90 capsule 1  . pantoprazole (PROTONIX) 40 MG tablet TAKE 1 TABLET (40 MG TOTAL) BY MOUTH DAILY BEFORE BREAKFAST. 90 tablet 3  . telmisartan (MICARDIS) 80 MG tablet Take 1 tablet (80 mg total) by mouth daily. 90 tablet 1   No facility-administered medications prior to visit.     ROS Review of Systems  Constitutional: Negative.  Negative for chills, fatigue and fever.  HENT: Positive for congestion, nosebleeds, postnasal drip, rhinorrhea, sinus pressure, sinus pain, sneezing and sore throat. Negative for ear pain, facial swelling, trouble swallowing and voice change.   Eyes: Negative.   Respiratory: Negative for cough, chest tightness and shortness of breath.   Cardiovascular: Negative for chest pain, palpitations and leg swelling.  Gastrointestinal: Negative.  Negative for abdominal pain, constipation, diarrhea,  nausea and vomiting.  Endocrine: Negative.   Genitourinary: Negative.  Negative for difficulty urinating.  Musculoskeletal: Negative.   Skin: Negative.   Neurological: Negative.  Negative for dizziness and weakness.  Hematological: Negative for adenopathy. Does not bruise/bleed easily.  Psychiatric/Behavioral: Negative.     Objective:  BP 118/70 (BP Location: Left Arm, Patient Position: Sitting, Cuff Size: Large)   Pulse 92   Temp 97.7 F (36.5 C) (Oral)   Resp 16   Ht 5\' 9"  (1.753 m)   Wt 270 lb (122.5 kg)   SpO2 98%   BMI 39.87 kg/m   BP Readings from Last 3 Encounters:  04/11/18 118/70  02/24/18 (!) 142/80  12/21/17 (!) 154/102    Wt Readings from Last 3 Encounters:  04/11/18 270 lb (122.5 kg)  02/24/18 255 lb (115.7 kg)  12/21/17 261 lb 3 oz (118.5 kg)    Physical Exam  Constitutional: He is oriented to person, place, and time. No distress.  HENT:  Right Ear: Hearing, tympanic membrane, external ear and ear canal normal.  Left Ear: Hearing, tympanic membrane, external ear and ear canal normal.  Nose: Mucosal edema and rhinorrhea present. No sinus tenderness, nasal deformity or nasal septal hematoma. Epistaxis is observed. Right sinus exhibits maxillary sinus tenderness. Right sinus exhibits no frontal sinus tenderness. Left sinus exhibits no maxillary sinus tenderness and no frontal sinus tenderness.  Mouth/Throat: Oropharynx is clear and moist. No oropharyngeal exudate.  Right anterior, lateral  nares shows an excoriated lesion with scab and scant amount of blood.  There is no pustule, erythema, or tenderness.  Eyes: Conjunctivae are normal.  Neck: Normal range of motion. Neck supple. No JVD present. No thyromegaly present.  Cardiovascular: Normal rate, regular rhythm and normal heart sounds. Exam reveals no friction rub.  No murmur heard. Pulmonary/Chest: Effort normal and breath sounds normal. He has no wheezes. He has no rales.  Abdominal: Soft. Normal appearance  and bowel sounds are normal. He exhibits no mass. There is no hepatosplenomegaly. There is no tenderness.  Musculoskeletal: Normal range of motion. He exhibits no edema, tenderness or deformity.  Lymphadenopathy:    He has no cervical adenopathy.  Neurological: He is alert and oriented to person, place, and time.  Skin: Skin is warm and dry. No rash noted. He is not diaphoretic.  Vitals reviewed.   Lab Results  Component Value Date   WBC 6.9 12/21/2017   HGB 14.2 12/21/2017   HCT 41.5 12/21/2017   PLT 251.0 12/21/2017   GLUCOSE 105 (H) 12/21/2017   CHOL 191 12/21/2017   TRIG 153.0 (H) 12/21/2017   HDL 50.50 12/21/2017   LDLDIRECT 144.0 12/21/2016   LDLCALC 110 (H) 12/21/2017   ALT 44 12/21/2017   AST 26 12/21/2017   NA 139 12/21/2017   K 3.9 12/21/2017   CL 103 12/21/2017   CREATININE 0.94 12/21/2017   BUN 21 12/21/2017   CO2 27 12/21/2017   TSH 2.25 12/21/2017   PSA 2.67 12/21/2017   INR 0.95 09/07/2017   HGBA1C 5.4 12/21/2017    Mr Shoulder Left W/o Contrast  Result Date: 12/06/2017 CLINICAL DATA:  Chronic left shoulder pain worse since a fall in December 2018. EXAM: MRI OF THE LEFT SHOULDER WITHOUT CONTRAST TECHNIQUE: Multiplanar, multisequence MR imaging of the shoulder was performed. No intravenous contrast was administered. COMPARISON:  None. FINDINGS: Rotator cuff: Moderate rotator cuff tendinopathy/tendinosis with interstitial tears mainly along the footprint attachment region of the supraspinatus tendon. No full-thickness retracted rotator cuff tear. Muscles: Mild diffuse fatty change involving the shoulder musculature. Biceps long head:  Intact Acromioclavicular Joint: Mild degenerative changes. Type 1-2 acromion. No lateral downsloping. Undersurface spurring. Os acromial noted. Glenohumeral Joint: Severe degenerative changes with marked joint space narrowing, full-thickness cartilage loss, subchondral cystic change, osteophytic spurring, slight widening and thinning of  the glenoid, moderate-sized joint effusion and loose ossified bodies. Labrum:  Degenerated and likely torn. Bones:  No acute bony abnormality. Other: No subacromial/subdeltoid fluid collections to suggest bursitis. IMPRESSION: 1. Severe glenohumeral joint degenerative changes. 2. Moderate rotator cuff tendinopathy/tendinosis but no full-thickness retracted tear. 3. Degenerated and likely torn glenoid labrum. 4. Intact long head biceps tendon. 5. Os acromiale. Electronically Signed   By: Marijo Sanes M.D.   On: 12/06/2017 09:37    Assessment & Plan:   Selmer was seen today for sinusitis and allergic rhinitis .  Diagnoses and all orders for this visit:  Seasonal allergic rhinitis due to pollen -     methylPREDNISolone (MEDROL DOSEPAK) 4 MG TBPK tablet; TAKE AS DIRECTED  Acute non-recurrent maxillary sinusitis -     amoxicillin-clavulanate (AUGMENTIN) 875-125 MG tablet; Take 1 tablet by mouth 2 (two) times daily for 10 days.  Acute anterior epistaxis- I have asked him to apply Bactroban ointment to the affected area.  I have also asked him to see ENT to see if there is a structural lesion that needs to be biopsied or cauterized. -     Ambulatory referral to  ENT -     mupirocin ointment (BACTROBAN) 2 %; Place 1 application into the nose 2 (two) times daily.   I am having Vicente Serene D. Cillo "Darren" start on amoxicillin-clavulanate, methylPREDNISolone, and mupirocin ointment. I am also having him maintain his pantoprazole, aspirin, telmisartan, alfuzosin, dutasteride, Cholecalciferol, and diclofenac sodium.  Meds ordered this encounter  Medications  . amoxicillin-clavulanate (AUGMENTIN) 875-125 MG tablet    Sig: Take 1 tablet by mouth 2 (two) times daily for 10 days.    Dispense:  20 tablet    Refill:  0  . methylPREDNISolone (MEDROL DOSEPAK) 4 MG TBPK tablet    Sig: TAKE AS DIRECTED    Dispense:  21 tablet    Refill:  0  . mupirocin ointment (BACTROBAN) 2 %    Sig: Place 1 application  into the nose 2 (two) times daily.    Dispense:  30 g    Refill:  1     Follow-up: Return in about 3 weeks (around 05/02/2018).  Scarlette Calico, MD

## 2018-04-11 NOTE — Patient Instructions (Signed)
Nosebleed, Adult A nosebleed is when blood comes out of the nose. Nosebleeds are common. Usually, they are not a sign of a serious condition. Nosebleeds can happen if a small blood vessel in your nose starts to bleed or if the lining of your nose (mucous membrane) cracks. They are commonly caused by:  Allergies.  Colds.  Picking your nose.  Blowing your nose too hard.  An injury from sticking an object into your nose or getting hit in the nose.  Dry or cold air.  Less common causes of nosebleeds include:  Toxic fumes.  Something abnormal in the nose or in the air-filled spaces in the bones of the face (sinuses).  Growths in the nose, such as polyps.  Medicines or conditions that cause blood to clot slowly.  Certain illnesses or procedures that irritate or dry out the nasal passages.  Follow these instructions at home: When you have a nosebleed:  Sit down and tilt your head slightly forward.  Use a clean towel or tissue to pinch your nostrils under the bony part of your nose. After 10 minutes, let go of your nose and see if bleeding starts again. Do not release pressure before that time. If there is still bleeding, repeat the pinching and holding for 10 minutes until the bleeding stops.  Do not place tissues or gauze in the nose to stop bleeding.  Avoid lying down and avoid tilting your head backward. That may make blood collect in the throat and cause gagging or coughing.  Use a nasal spray decongestant to help with a nosebleed as told by your health care provider.  Do not use petroleum jelly or mineral oil in your nose. It can drip into your lungs. After a nosebleed:  Avoid blowing your nose or sniffing for a number of hours.  Avoid straining, lifting, or bending at the waist for several days. You may resume other normal activities as you are able.  Use saline spray or a humidifier as told by your health care provider.  Aspirinand blood thinners make bleeding more  likely. If you are prescribed these medicines and you suffer from nosebleeds: ? Ask your health care provider if you should stop taking the medicines or if you should adjust the dose. ? Do not stop taking medicines that your health care provider has recommended unless told by your health care provider.  If your nosebleed was caused by dry mucous membranes, use over-the-counter saline nasal spray or gel. This will keep the mucous membranes moist and allow them to heal. If you must use a lubricant: ? Choose one that is water-soluble. ? Use only as much as you need and use it only as often as needed. ? Do not lie down until several hours after you use it. Contact a health care provider if:  You have a fever.  You get nosebleeds often or more often than usual.  You bruise very easily.  You have a nosebleed from having something stuck in your nose.  You have bleeding in your mouth.  You vomit or cough up brown material.  You have a nosebleed after you start a new medicine. Get help right away if:  You have a nosebleed after a fall or a head injury.  Your nosebleed does not go away after 20 minutes.  You feel dizzy or weak.  You have unusual bleeding from other parts of your body.  You have unusual bruising on other parts of your body.  You become sweaty.    You vomit blood. This information is not intended to replace advice given to you by your health care provider. Make sure you discuss any questions you have with your health care provider. Document Released: 06/23/2005 Document Revised: 05/13/2016 Document Reviewed: 03/30/2016 Elsevier Interactive Patient Education  2018 Else[employer] Inc[employer]  

## 2018-05-30 ENCOUNTER — Ambulatory Visit (INDEPENDENT_AMBULATORY_CARE_PROVIDER_SITE_OTHER)
Admission: RE | Admit: 2018-05-30 | Discharge: 2018-05-30 | Disposition: A | Discharge status: Home / Self Care | Payer: BC Managed Care – PPO | Source: Ambulatory Visit | Attending: Internal Medicine | Admitting: Internal Medicine

## 2018-05-30 ENCOUNTER — Ambulatory Visit: Payer: BC Managed Care – PPO | Admitting: Internal Medicine

## 2018-05-30 ENCOUNTER — Encounter: Payer: Self-pay | Admitting: Internal Medicine

## 2018-05-30 VITALS — BP 132/80 | HR 100 | Temp 98.1°F | Ht 69.0 in | Wt 270.0 lb

## 2018-05-30 DIAGNOSIS — R05 Cough: Secondary | ICD-10-CM

## 2018-05-30 DIAGNOSIS — R059 Cough, unspecified: Secondary | ICD-10-CM

## 2018-05-30 MED ORDER — DOXYCYCLINE HYCLATE 100 MG PO TABS: 100.0000 mg | ORAL_TABLET | Freq: Two times a day (BID) | ORAL | 0 refills | Status: DC

## 2018-05-30 NOTE — Progress Notes (Signed)
   Subjective:    Patient ID: Taylor Schroeder, male    DOB: 1966/07/26, 52 y.o.   MRN: [id number]  HPI The patient is a 52 YO man coming in for cough and chest discomfort. They started about 1-2 weeks ago. Include: cough, sob, feeling like he cannot breathe deeply. Does not have sinus pressure, sinus congestion, nasal drainage, headaches, fevers or chills. He denies any injury, recent travel in the last 3 months more than 2 hours, denies trauma. Denies coughing up anything or blood in sputum. Has tried some over the counter cough medicine which has not helped. Overall they are stable but not improving.   Review of Systems  Constitutional: Positive for activity change, appetite change and fatigue. Negative for chills, fever and unexpected weight change.  HENT: Negative for congestion, ear discharge, ear pain, postnasal drip, rhinorrhea, sinus pressure, sinus pain, sneezing, sore throat, tinnitus, trouble swallowing and voice change.   Eyes: Negative.   Respiratory: Positive for cough and shortness of breath. Negative for chest tightness and wheezing.   Cardiovascular: Negative.   Gastrointestinal: Negative.   Musculoskeletal: Negative.   Neurological: Negative.       Objective:   Physical Exam  Constitutional: He is oriented to person, place, and time. He appears well-developed and well-nourished.  HENT:  Head: Normocephalic and atraumatic.  Some redness oropharynx with drainage.   Eyes: EOM are normal.  Neck: Normal range of motion.  Cardiovascular: Normal rate and regular rhythm.  Pulmonary/Chest: Effort normal and breath sounds normal. No respiratory distress. He has no wheezes. He has no rales.  Shallow breathing on exam  Abdominal: Soft. Bowel sounds are normal. He exhibits no distension. There is no tenderness. There is no rebound.  Musculoskeletal: He exhibits no edema.  Neurological: He is alert and oriented to person, place, and time. Coordination normal.  Skin: Skin is warm  and dry.   Vitals:   05/30/18 1059  BP: 132/80  Pulse: 100  Temp: 98.1 F (36.7 C)  TempSrc: Oral  SpO2: 96%  Weight: 270 lb (122.5 kg)  Height: 5\' 9"  (1.753 m)      Assessment & Plan:

## 2018-05-30 NOTE — Assessment & Plan Note (Signed)
Checking CXR today, rx for doxycycline given time course and lack of improvement. No signs of sinusitis on exam today.

## 2018-05-30 NOTE — Patient Instructions (Signed)
We have sent in doxycycline to take 1 pill twice a day for 1 week.   We will check the chest x-ray today and call you with the results.

## 2018-05-31 ENCOUNTER — Ambulatory Visit: Payer: BC Managed Care – PPO | Admitting: Internal Medicine

## 2018-06-02 ENCOUNTER — Telehealth (INDEPENDENT_AMBULATORY_CARE_PROVIDER_SITE_OTHER): Payer: Self-pay | Admitting: Orthopaedic Surgery

## 2018-06-02 NOTE — Telephone Encounter (Signed)
I called patient and scheduled an office visit with Dr. Lorin Mercy and H&P with Jeneen Rinks on the same day (Oct 18th).  Patient would like to reserve the date of Friday July 28, 2018 for his Left Total Shoulder. It is attentively being held on our schedule at 7:30am for that date. Medical clearance has been sent to PCP-Dr. Scarlette Calico.

## 2018-06-02 NOTE — Telephone Encounter (Signed)
noted 

## 2018-06-02 NOTE — Telephone Encounter (Signed)
Taylor Schroeder has spoken with patient and made follow up appointment.

## 2018-06-02 NOTE — Telephone Encounter (Signed)
Unfortunately will need ROV with me for note to submit for insurance for approval. Could schedule same time Jeneen Rinks in office also for Orders and H & P note . Thanks ucall

## 2018-06-02 NOTE — Telephone Encounter (Signed)
Patient would like to proceed with the Left Shoulder Replacement.  Does he need to come back in for an office before scheduling? (aside from H&P with Jeneen Rinks)  Also patient wants to know what his limits will be after the post operative period.  Will he be able to return to work without limits?  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   cb  [phone]

## 2018-06-02 NOTE — Telephone Encounter (Signed)
Please advise 

## 2018-06-17 ENCOUNTER — Other Ambulatory Visit: Payer: Self-pay | Admitting: Internal Medicine

## 2018-06-17 DIAGNOSIS — N401 Enlarged prostate with lower urinary tract symptoms: Secondary | ICD-10-CM

## 2018-06-18 ENCOUNTER — Other Ambulatory Visit: Payer: Self-pay | Admitting: Internal Medicine

## 2018-06-18 DIAGNOSIS — I10 Essential (primary) hypertension: Secondary | ICD-10-CM

## 2018-06-23 NOTE — Telephone Encounter (Signed)
Patient states he has used up all of his short term disability and is not able to schedule this year. He will call in the future.

## 2018-06-23 NOTE — Telephone Encounter (Signed)
OK we can do it next year

## 2018-06-23 NOTE — Telephone Encounter (Signed)
fyi

## 2018-06-24 ENCOUNTER — Other Ambulatory Visit: Payer: Self-pay | Admitting: Internal Medicine

## 2018-07-03 ENCOUNTER — Ambulatory Visit: Payer: BC Managed Care – PPO | Admitting: Family

## 2018-07-03 ENCOUNTER — Other Ambulatory Visit: Payer: Self-pay | Admitting: Family

## 2018-07-03 ENCOUNTER — Other Ambulatory Visit (INDEPENDENT_AMBULATORY_CARE_PROVIDER_SITE_OTHER): Payer: BC Managed Care – PPO

## 2018-07-03 ENCOUNTER — Encounter: Payer: Self-pay | Admitting: Family

## 2018-07-03 VITALS — BP 138/76 | HR 71 | Temp 97.7°F | Ht 69.0 in | Wt 267.0 lb

## 2018-07-03 DIAGNOSIS — R42 Dizziness and giddiness: Secondary | ICD-10-CM | POA: Diagnosis not present

## 2018-07-03 DIAGNOSIS — R109 Unspecified abdominal pain: Secondary | ICD-10-CM

## 2018-07-03 LAB — CBC WITH DIFFERENTIAL/PLATELET
BASOS ABS: 0 10*3/uL (ref 0.0–0.1)
Basophils Relative: 0.6 % (ref 0.0–3.0)
Eosinophils Absolute: 0.1 10*3/uL (ref 0.0–0.7)
Eosinophils Relative: 1.8 % (ref 0.0–5.0)
HCT: 42.6 % (ref 39.0–52.0)
Hemoglobin: 14.7 g/dL (ref 13.0–17.0)
LYMPHS ABS: 2.2 10*3/uL (ref 0.7–4.0)
Lymphocytes Relative: 31.2 % (ref 12.0–46.0)
MCHC: 34.4 g/dL (ref 30.0–36.0)
MCV: 89.4 fl (ref 78.0–100.0)
MONOS PCT: 8.3 % (ref 3.0–12.0)
Monocytes Absolute: 0.6 10*3/uL (ref 0.1–1.0)
NEUTROS ABS: 4.2 10*3/uL (ref 1.4–7.7)
NEUTROS PCT: 58.1 % (ref 43.0–77.0)
PLATELETS: 262 10*3/uL (ref 150.0–400.0)
RBC: 4.77 Mil/uL (ref 4.22–5.81)
RDW: 13.7 % (ref 11.5–15.5)
WBC: 7.1 10*3/uL (ref 4.0–10.5)

## 2018-07-03 LAB — COMPREHENSIVE METABOLIC PANEL
ALK PHOS: 61 U/L (ref 39–117)
ALT: 46 U/L (ref 0–53)
AST: 25 U/L (ref 0–37)
Albumin: 4.4 g/dL (ref 3.5–5.2)
BUN: 18 mg/dL (ref 6–23)
CALCIUM: 9.3 mg/dL (ref 8.4–10.5)
CO2: 29 mEq/L (ref 19–32)
CREATININE: 1 mg/dL (ref 0.40–1.50)
Chloride: 103 mEq/L (ref 96–112)
GFR: 83.02 mL/min (ref >60.00)
GLUCOSE: 96 mg/dL (ref 70–99)
Potassium: 4.3 mEq/L (ref 3.5–5.1)
Sodium: 138 mEq/L (ref 135–145)
TOTAL PROTEIN: 7.3 g/dL (ref 6.0–8.3)
Total Bilirubin: 0.6 mg/dL (ref 0.2–1.2)

## 2018-07-03 LAB — AMYLASE: Amylase: 39 U/L (ref 27–131)

## 2018-07-03 LAB — LIPASE: Lipase: 24 U/L (ref 11.0–59.0)

## 2018-07-03 NOTE — Progress Notes (Signed)
Taylor Schroeder is a 52 y.o. male with the following history as recorded in [employer]:  Patient Active Problem List   Diagnosis Date Noted  . Seasonal allergic rhinitis due to pollen 04/11/2018  . Acute anterior epistaxis 04/11/2018  . Iliotibial band syndrome affecting lower leg, right 02/24/2018  . Vitamin D deficiency 12/21/2017  . 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 Sleep apnea 03/08/2017  . Hyperlipidemia with target LDL less than 100 12/22/2016  . Cough 07/13/2016  . Acute non-recurrent maxillary sinusitis 11/18/2015  . Primary erectile dysfunction 11/18/2014  . Essential hypertension 07/15/2014  . DDD (degenerative disc disease), cervical 10/03/2013  . Esophageal ring, acquired 07/12/2013  . Crohn's ileitis - working dx 06/01/2013  . BPH (benign prostatic hyperplasia) 04/18/2013  . Pernicious anemia-B12 deficiency 04/18/2013  . Severe obesity (BMI >= 40) ([place]) 12/21/2012  . GERD (gastroesophageal reflux disease) 12/21/2012  . Hyperglycemia 08/29/2012  . Routine general medical examination at a health care facility 08/29/2012    Current Outpatient Medications  Medication Sig Dispense Refill  . Cholecalciferol 2000 units TABS Take 1 tablet (2,000 Units total) by mouth daily. 90 tablet 1  . diclofenac sodium (VOLTAREN) 1 % GEL Apply 4 g topically 4 (four) times daily. 4 Tube 0  . pantoprazole (PROTONIX) 40 MG tablet TAKE 1 TABLET (40 MG TOTAL) BY MOUTH DAILY BEFORE BREAKFAST. 90 tablet 0  . telmisartan (MICARDIS) 80 MG tablet TAKE 1 TABLET BY MOUTH EVERY DAY 90 tablet 1  . alfuzosin (UROXATRAL) 10 MG 24 hr tablet TAKE 1 TABLET (10 MG TOTAL) BY MOUTH DAILY WITH BREAKFAST. (Patient not taking: Reported on 07/03/2018) 90 tablet 1  . dutasteride (AVODART) 0.5 MG capsule Take 1 capsule (0.5 mg total) by mouth daily. (Patient not taking: Reported on 07/03/2018) 90 capsule 1   No current facility-administered medications for this visit.     Allergies: Allegra  [fexofenadine hcl]; Hctz [hydrochlorothiazide]; and Demerol [meperidine]  Past Medical History:  Diagnosis Date  . Arthritis    "qwhere" (02/03/2017)  . BPH (benign prostatic hyperplasia)   . Childhood asthma    "as a baby"  . Coronary artery disease Non-obstructive   a. nonobs cath in 2006;  b. 01/2013 Cath: LM nl, LAD 64m LCX nl, RCA non-dom, nl. EF 60-65%.  . Crohn's ileitis - working dx 06/01/2013   "a mild case of it"  . Esophageal ring, acquired 07/12/2013  . GERD (gastroesophageal reflux disease)   . H/O hiatal hernia    "think so" (02/06/2014)  . Headache    "resolved w/BP control in 11/2015" (02/03/2017)  . High cholesterol   . Hypertension   . Obesity   . Pernicious anemia-B12 deficiency 04/18/2013  . PONV (postoperative nausea and vomiting) 02/03/2017; 08/2016  . Sleep apnea     Past Surgical History:  Procedure Laterality Date  . APPENDECTOMY    . CARDIAC CATHETERIZATION  01/2013   "results were clear"  . CARPAL TUNNEL RELEASE Right   . CHOLECYSTECTOMY N/A 02/03/2017   Procedure: LAPAROSCOPIC CHOLECYSTECTOMY;  Surgeon: WGreer Pickerel MD;  [facility] [facility]  Service: General;  Laterality: N/A;  . COLONOSCOPY  ~ 2014  . ESOPHAGOGASTRODUODENOSCOPY  ~ 2014  . INGUINAL HERNIA REPAIR  1966   "? side"  . KNEE ARTHROPLASTY Left 02/06/2014   Procedure: COMPUTER ASSISTED TOTAL KNEE ARTHROPLASTY;  Surgeon: MMarybelle Killings MD;  [facility] [facility]  Service: Orthopedics;  Laterality: Left;  Cemented Left Total Knee Arthroplasty  . KNEE ARTHROSCOPY Left 08/2016  scar tissue cleaned out  . LAPAROSCOPIC CHOLECYSTECTOMY  02/03/2017  . LEFT HEART CATHETERIZATION WITH CORONARY ANGIOGRAM N/A 01/29/2013   Procedure: LEFT HEART CATHETERIZATION WITH CORONARY ANGIOGRAM;  Surgeon: Thayer Headings, MD;  Location[facility] CATH LAB;  Service: Cardiovascular;  Laterality: N/A;  . MULTIPLE TOOTH EXTRACTIONS  09/2016  . SHOULDER ARTHROSCOPY Left 03/12/2013  . TOTAL KNEE ARTHROPLASTY Left 02/06/2014  . TOTAL  KNEE ARTHROPLASTY Right 09/14/2017  . TOTAL KNEE ARTHROPLASTY Right 09/14/2017   Procedure: RIGHT TOTAL KNEE ARTHROPLASTY;  Surgeon: [name], MD;  Location: [facility];  Service: Orthopedics;  Laterality: Right;    Family History  Problem Relation Age of Onset  . Hypertension Mother   . Cancer Father        type unknown  . COPD Other   . Emphysema Maternal Grandmother   . Emphysema Maternal Grandfather   . Other Paternal Grandmother        spinal cancer  . Alcohol abuse Neg Hx   . Diabetes Neg Hx   . Early death Neg Hx   . Heart disease Neg Hx   . Hyperlipidemia Neg Hx   . Kidney disease Neg Hx   . Stroke Neg Hx   . Colon cancer Neg Hx   . Stomach cancer Neg Hx   . Esophageal cancer Neg Hx   . Rectal cancer Neg Hx   . Liver cancer Neg Hx     Social History   Tobacco Use  . Smoking status: Former Smoker    Packs/day: 2.00    Years: 30.00    Pack years: 60.00    Types: Cigarettes    Last attempt to quit: 08/29/2008    Years since quitting: 9.8  . Smokeless tobacco: Never Used  Substance Use Topics  . Alcohol use: No    Comment: 02/03/2017 "maybe 12 pack of beer all at one time but only once/year"; 02/06/2014 "couple beers a couple times/month"    Subjective:  Patient presents with concerns for abdominal pain x 2-3 weeks; history of GERD- on Protonix daily; notes that has been having some dizzy spells "on and off" for the past few weeks; notes that nausea is more problematic recently; s/p cholecystectomy; worried about possible pancreatitis- did have pancreatitis when gallbladder disease was diagnosed; no blood in stool; no vomiting; denies fever but felt "feverish" yesterday; does take at least 4 Ibuprofen daily; denies any coffee grounds emesis; notes that appetite is down recently;     Objective:  Vitals:   07/03/18 1104  BP: 138/76  Pulse: 71  Temp: 97.7 F (36.5 C)  TempSrc: Oral  SpO2: 97%  Weight: 267 lb 0.6 oz (121.1 kg)  Height: '5\' 9"'  (1.753 m)     General: Well developed, well nourished, in no acute distress  Skin : Warm and dry.  Head: Normocephalic and atraumatic  Eyes: Sclera and conjunctiva clear; pupils round and reactive to light; extraocular movements intact  Ears: External normal; canals clear; tympanic membranes normal  Oropharynx: Pink, supple. No suspicious lesions  Neck: Supple without thyromegaly, adenopathy  Lungs: Respirations unlabored; clear to auscultation bilaterally without wheeze, rales, rhonchi  CVS exam: normal rate and regular rhythm.  Abdomen: Soft; nontender; nondistended; normoactive bowel sounds; no masses or hepatosplenomegaly  Neurologic: Alert and oriented; speech intact; face symmetrical; moves all extremities well; CNII-XII intact without focal deficit   Assessment:  1. Abdominal pain, unspecified abdominal location   2. Dizziness     Plan:  ? Etiology of symptoms;  update EKG- no acute ischemic changes seen today; update labs; will most likely need CT abd/ pelvis and referral to GI; follow-up to be determined.   No follow-ups on file.  Orders Placed This Encounter  Procedures  . CBC w/Diff    Standing Status:   Future    Number of Occurrences:   1    Standing Expiration Date:   07/03/2019  . Comp Met (CMET)    Standing Status:   Future    Number of Occurrences:   1    Standing Expiration Date:   07/03/2019  . Iron, TIBC and Ferritin Panel    Standing Status:   Future    Number of Occurrences:   1    Standing Expiration Date:   07/04/2019  . Amylase    Standing Status:   Future    Number of Occurrences:   1    Standing Expiration Date:   07/03/2019  . Lipase    Standing Status:   Future    Number of Occurrences:   1    Standing Expiration Date:   07/03/2019  . EKG 12-Lead    Requested Prescriptions    No prescriptions requested or ordered in this encounter

## 2018-07-04 LAB — IRON,TIBC AND FERRITIN PANEL
%SAT: 36 % (calc) (ref 20–48)
FERRITIN: 271 ng/mL (ref 38–380)
Iron: 117 ug/dL (ref 50–180)
TIBC: 329 mcg/dL (calc) (ref 250–425)

## 2018-07-05 ENCOUNTER — Ambulatory Visit (INDEPENDENT_AMBULATORY_CARE_PROVIDER_SITE_OTHER)
Admission: RE | Admit: 2018-07-05 | Discharge: 2018-07-05 | Disposition: A | Discharge status: Home / Self Care | Payer: BC Managed Care – PPO | Source: Ambulatory Visit | Attending: Family | Admitting: Family

## 2018-07-05 ENCOUNTER — Other Ambulatory Visit: Payer: Self-pay | Admitting: Family

## 2018-07-05 DIAGNOSIS — R109 Unspecified abdominal pain: Secondary | ICD-10-CM

## 2018-07-05 DIAGNOSIS — R1010 Upper abdominal pain, unspecified: Secondary | ICD-10-CM

## 2018-07-05 DIAGNOSIS — N3289 Other specified disorders of bladder: Secondary | ICD-10-CM

## 2018-07-05 MED ORDER — IOPAMIDOL (ISOVUE-300) INJECTION 61%
100.0000 mL | Freq: Once | INTRAVENOUS | Status: AC | PRN
  Administered 2018-07-05: 100 mL via INTRAVENOUS

## 2018-07-05 MED ORDER — CIPROFLOXACIN HCL 500 MG PO TABS: 500.0000 mg | ORAL_TABLET | Freq: Two times a day (BID) | ORAL | 0 refills | Status: DC

## 2018-07-11 ENCOUNTER — Other Ambulatory Visit: Payer: Self-pay | Admitting: Internal Medicine

## 2018-07-11 DIAGNOSIS — E559 Vitamin D deficiency, unspecified: Secondary | ICD-10-CM

## 2018-07-19 ENCOUNTER — Ambulatory Visit (INDEPENDENT_AMBULATORY_CARE_PROVIDER_SITE_OTHER): Payer: BC Managed Care – PPO | Admitting: Orthopaedic Surgery

## 2018-07-19 ENCOUNTER — Ambulatory Visit (INDEPENDENT_AMBULATORY_CARE_PROVIDER_SITE_OTHER): Payer: BC Managed Care – PPO | Admitting: Surgery

## 2018-08-16 ENCOUNTER — Ambulatory Visit: Payer: BC Managed Care – PPO | Admitting: Internal Medicine

## 2018-08-16 ENCOUNTER — Ambulatory Visit (INDEPENDENT_AMBULATORY_CARE_PROVIDER_SITE_OTHER): Payer: BC Managed Care – PPO

## 2018-08-16 ENCOUNTER — Encounter: Payer: Self-pay | Admitting: Internal Medicine

## 2018-08-16 VITALS — BP 140/86 | HR 84 | Ht 69.0 in | Wt 271.6 lb

## 2018-08-16 DIAGNOSIS — R1013 Epigastric pain: Secondary | ICD-10-CM

## 2018-08-16 DIAGNOSIS — K222 Esophageal obstruction: Secondary | ICD-10-CM | POA: Diagnosis not present

## 2018-08-16 DIAGNOSIS — R1310 Dysphagia, unspecified: Secondary | ICD-10-CM | POA: Diagnosis not present

## 2018-08-16 DIAGNOSIS — Z23 Encounter for immunization: Secondary | ICD-10-CM | POA: Diagnosis not present

## 2018-08-16 DIAGNOSIS — R1319 Other dysphagia: Secondary | ICD-10-CM

## 2018-08-16 NOTE — Progress Notes (Signed)
Taylor Schroeder 52 y.o. Oct 31, 1965 [id number]  Assessment & Plan:   Encounter Diagnoses  Name Primary?  . Abdominal pain, epigastric Yes  . Esophageal dysphagia   . Esophageal ring     I am not sure what this abdominal pain is.  His gallbladder is gone and it does not seem like biliary colic to me.  He might have had a gastritis.  He continues to have dysphagia with a known Schatzki ring.  I plan for an EGD with dilation of the esophagus and inspection of the mucosa.  He might need modification of his PPI therapy either changing type or changing dosing or both.  The risks and benefits as well as alternatives of endoscopic procedure(s) have been discussed and reviewed. All questions answered. The patient agrees to proceed.    Subjective:   Chief Complaint: Epigastric pain  HPI The patient is here, having had a week or 2 of epigastric pain with a lot of belching and nausea and distress.  After about 2 weeks or so of the problems he went and saw Jodi Mourning, NP, CBC chemistries lipase amylase were all okay as was a CT the abdomen pelvis other than a thickened urinary bladder.  He is better but is still having some intermittent symptoms.  He was seen by me in 2017 and I had recommended EGD and dilation of Schatzki ring but he did not do that because and he could not afford it at the time.  He continues with intermittent solid food dysphagia where the food hangs and sticks but he also strangles and coughs at times.  He does not have a lot of heartburn on his pantoprazole.  Seems like little to none really.  Sometimes he will get indigestion with dietary indiscretion but again it seems rare.  He has discovered he is lactose intolerant.  He says the symptoms over the location of pain he is having now are really similar to his symptoms prior to cholecystectomy but the pain is nowhere near as intense as that was.  He had gallstone pancreatitis.  Bowel habits are unchanged. Allergies  Allergen  Reactions  . Allegra [Fexofenadine Hcl] Hives  . Hctz [Hydrochlorothiazide] Other (See Comments)    Weak and muscle cramps   . Demerol [Meperidine] Nausea And Vomiting    "Bad things" happen when I take it"   Current Meds  Medication Sig  . CVS D3 2000 units CAPS TAKE 1 CAPSULE BY MOUTH EVERY DAY  . diclofenac sodium (VOLTAREN) 1 % GEL Apply 4 g topically 4 (four) times daily.  . pantoprazole (PROTONIX) 40 MG tablet TAKE 1 TABLET (40 MG TOTAL) BY MOUTH DAILY BEFORE BREAKFAST.  Marland Kitchen telmisartan (MICARDIS) 80 MG tablet TAKE 1 TABLET BY MOUTH EVERY DAY   Past Medical History:  Diagnosis Date  . Arthritis    "qwhere" (02/03/2017)  . BPH (benign prostatic hyperplasia)   . Childhood asthma    "as a baby"  . Coronary artery disease Non-obstructive   a. nonobs cath in 2006;  b. 01/2013 Cath: LM nl, LAD 76m, LCX nl, RCA non-dom, nl. EF 60-65%.  . Esophageal ring, acquired 07/12/2013  . GERD (gastroesophageal reflux disease)   . H/O hiatal hernia    "think so" (02/06/2014)  . Headache    "resolved w/BP control in 11/2015" (02/03/2017)  . High cholesterol   . Hypertension   . Ileitis 2014   Suspected Crohn's, think not as time has gone on  . Obesity   .  Pernicious anemia-B12 deficiency 04/18/2013  . PONV (postoperative nausea and vomiting) 02/03/2017; 08/2016  . Sleep apnea    Past Surgical History:  Procedure Laterality Date  . APPENDECTOMY    . CARDIAC CATHETERIZATION  01/2013   "results were clear"  . CARPAL TUNNEL RELEASE Right   . CHOLECYSTECTOMY N/A 02/03/2017   Procedure: LAPAROSCOPIC CHOLECYSTECTOMY;  Surgeon: Greer Pickerel, MD;  Location: [facility];  Service: General;  Laterality: N/A;  . COLONOSCOPY  ~ 2014  . ESOPHAGOGASTRODUODENOSCOPY  ~ 2014  . INGUINAL HERNIA REPAIR  1966   "? side"  . KNEE ARTHROPLASTY Left 02/06/2014   Procedure: COMPUTER ASSISTED TOTAL KNEE ARTHROPLASTY;  Surgeon: [name], MD;  Location: [facility];  Service: Orthopedics;  Laterality: Left;  Cemented Left  Total Knee Arthroplasty  . KNEE ARTHROSCOPY Left 08/2016   scar tissue cleaned out  . LAPAROSCOPIC CHOLECYSTECTOMY  02/03/2017  . LEFT HEART CATHETERIZATION WITH CORONARY ANGIOGRAM N/A 01/29/2013   Procedure: LEFT HEART CATHETERIZATION WITH CORONARY ANGIOGRAM;  Surgeon: Thayer Headings, MD;  Location: [facility] CATH LAB;  Service: Cardiovascular;  Laterality: N/A;  . MULTIPLE TOOTH EXTRACTIONS  09/2016  . SHOULDER ARTHROSCOPY Left 03/12/2013  . TOTAL KNEE ARTHROPLASTY Left 02/06/2014  . TOTAL KNEE ARTHROPLASTY Right 09/14/2017  . TOTAL KNEE ARTHROPLASTY Right 09/14/2017   Procedure: RIGHT TOTAL KNEE ARTHROPLASTY;  Surgeon: [name], MD;  Location: [facility];  Service: Orthopedics;  Laterality: Right;   Social History   Social History Narrative   Married, 2 daughters   He does HVAC work for the [employer]   Rare alcohol, former smoker no drugs no other tobacco   family history includes COPD in his other; Cancer in his father; Emphysema in his maternal grandfather and maternal grandmother; Hypertension in his mother; Other in his paternal grandmother.   Review of Systems As per HPI.  He is going to see urology about the thickened bladder.  Sounds like he has some BPH symptoms as well.  Objective:   Physical Exam BP 140/86   Pulse 84   Ht 5\' 9"  (1.753 m)   Wt 271 lb 9.6 oz (123.2 kg)   BMI 40.11 kg/m  Obese, NAD Eyes anicteric Lungs cta Cor s1s2 no rmg abd obese, BS+, soft, mildly tender epigastrium NL mood and affect

## 2018-08-16 NOTE — Patient Instructions (Signed)

## 2018-09-04 ENCOUNTER — Ambulatory Visit (AMBULATORY_SURGERY_CENTER): Payer: BC Managed Care – PPO | Admitting: Internal Medicine

## 2018-09-04 ENCOUNTER — Encounter: Payer: Self-pay | Admitting: Internal Medicine

## 2018-09-04 VITALS — BP 137/86 | HR 85 | Temp 97.8°F | Resp 14 | Ht 69.0 in | Wt 271.0 lb

## 2018-09-04 DIAGNOSIS — R1310 Dysphagia, unspecified: Secondary | ICD-10-CM | POA: Diagnosis not present

## 2018-09-04 DIAGNOSIS — R1013 Epigastric pain: Secondary | ICD-10-CM | POA: Diagnosis not present

## 2018-09-04 DIAGNOSIS — K222 Esophageal obstruction: Secondary | ICD-10-CM | POA: Diagnosis not present

## 2018-09-04 DIAGNOSIS — R1319 Other dysphagia: Secondary | ICD-10-CM

## 2018-09-04 DIAGNOSIS — K219 Gastro-esophageal reflux disease without esophagitis: Secondary | ICD-10-CM

## 2018-09-04 MED ORDER — ESOMEPRAZOLE MAGNESIUM 40 MG PO CPDR: 40.0000 mg | DELAYED_RELEASE_CAPSULE | Freq: Every day | ORAL | 3 refills | Status: DC

## 2018-09-04 MED ORDER — SODIUM CHLORIDE 0.9 % IV SOLN: 500.0000 mL | Freq: Once | INTRAVENOUS | Status: DC

## 2018-09-04 NOTE — Op Note (Signed)
[place] [facility] Patient Name: Taylor Schroeder Procedure Date: 09/04/2018 11:50 AM MRN: [id number] Endoscopist: Gatha Mayer , MD Age: 52 Referring MD:  Date of Birth: [birth year] Gender: Male Account #: [id number] Procedure:                Upper GI endoscopy Indications:              Epigastric abdominal pain, Dysphagia Medicines:                Propofol per Anesthesia, Monitored Anesthesia Care Procedure:                Pre-Anesthesia Assessment:                           - Prior to the procedure, a History and Physical                            was performed, and patient medications and                            allergies were reviewed. The patient's tolerance of                            previous anesthesia was also reviewed. The risks                            and benefits of the procedure and the sedation                            options and risks were discussed with the patient.                            All questions were answered, and informed consent                            was obtained. Prior Anticoagulants: The patient has                            taken no previous anticoagulant or antiplatelet                            agents. ASA Grade Assessment: II - A patient with                            mild systemic disease. After reviewing the risks                            and benefits, the patient was deemed in                            satisfactory condition to undergo the procedure.                           After obtaining informed consent, the endoscope was  passed under direct vision. Throughout the                            procedure, the patient's blood pressure, pulse, and                            oxygen saturations were monitored continuously. The                            Endoscope was introduced through the mouth, and                            advanced to the second part of duodenum. The upper                             GI endoscopy was accomplished without difficulty.                            The patient tolerated the procedure well. Scope In: Scope Out: Findings:                 One benign-appearing, intrinsic mild stenosis was                            found in the distal esophagus. The stenosis was                            traversed. The scope was withdrawn. Dilation was                            performed with a Maloney dilator with mild                            resistance at 83 Fr. Estimated blood loss: none.                           The exam was otherwise without abnormality.                           The cardia and gastric fundus were normal on                            retroflexion. Complications:            No immediate complications. Estimated Blood Loss:     Estimated blood loss: none. Impression:               - Benign-appearing esophageal stenosis. Dilated.                           - The examination was otherwise normal.                           - No specimens collected. Recommendation:           - Patient has a contact number available for  emergencies. The signs and symptoms of potential                            delayed complications were discussed with the                            patient. Return to normal activities tomorrow.                            Written discharge instructions were provided to the                            patient.                           - Clear liquids x 1 hour then soft foods rest of                            day. Start prior diet tomorrow.                           - Continue present medications.                           - 1) Change pantoprazole to esomeprazole 40 mg qd                           2) if no better in 1 month he is to call back and                            would consider esophageal manometry vs ba swallow Gatha Mayer, MD 09/04/2018 12:16:26 PM This report has been signed electronically.

## 2018-09-04 NOTE — Patient Instructions (Addendum)
There was mild narrowing where the esophagus and stomach meet.  I dilated that.  All else looks ok.  Let's change the pantoprazole to esomeprazole (generic Nexium) and see what that and the dilation do.  If you are still having problems in 1 month please call me back as we would need to consider other evaluation.  I appreciate the opportunity to care for you. Gatha Mayer, MD, FACG  YOU HAD AN ENDOSCOPIC PROCEDURE TODAY AT [facility]:   Refer to the procedure report that was given to you for any specific questions about what was found during the examination.  If the procedure report does not answer your questions, please call your gastroenterologist to clarify.  If you requested that your care partner not be given the details of your procedure findings, then the procedure report has been included in a sealed envelope for you to review at your convenience later.  YOU SHOULD EXPECT: Some feelings of bloating in the abdomen. Passage of more gas than usual.  Walking can help get rid of the air that was put into your GI tract during the procedure and reduce the bloating. If you had a lower endoscopy (such as a colonoscopy or flexible sigmoidoscopy) you may notice spotting of blood in your stool or on the toilet paper. If you underwent a bowel prep for your procedure, you may not have a normal bowel movement for a few days.  Please Note:  You might notice some irritation and congestion in your nose or some drainage.  This is from the oxygen used during your procedure.  There is no need for concern and it should clear up in a day or so.  SYMPTOMS TO REPORT IMMEDIATELY:   Following upper endoscopy (EGD)  Vomiting of blood or coffee ground material  New chest pain or pain under the shoulder blades  Painful or persistently difficult swallowing  New shortness of breath  Fever of 100F or higher  Black, tarry-looking stools  For urgent or emergent issues, a  gastroenterologist can be reached at any hour by calling [phone].   DIET:  We do recommend a small meal at first, but then you may proceed to your regular diet.  Drink plenty of fluids but you should avoid alcoholic beverages for 24 hours.  ACTIVITY:  You should plan to take it easy for the rest of today and you should NOT DRIVE or use heavy machinery until tomorrow (because of the sedation medicines used during the test).    FOLLOW UP: Our staff will call the number listed on your records the next business day following your procedure to check on you and address any questions or concerns that you may have regarding the information given to you following your procedure. If we do not reach you, we will leave a message.  However, if you are feeling well and you are not experiencing any problems, there is no need to return our call.  We will assume that you have returned to your regular daily activities without incident.  If any biopsies were taken you will be contacted by phone or by letter within the next 1-3 weeks.  Please call us at [phone] if you have not heard about the biopsies in 3 weeks.    SIGNATURES/CONFIDENTIALITY: You and/or your care partner have signed paperwork which will be entered into your electronic medical record.  These signatures attest to the fact that that the information above on your After Visit Summary  has been reviewed and is understood.  Full responsibility of the confidentiality of this discharge information lies with you and/or your care-partner.

## 2018-09-04 NOTE — Progress Notes (Signed)
Called to room to assist during endoscopic procedure.  Patient ID and intended procedure confirmed with present staff. Received instructions for my participation in the procedure from the performing physician.  

## 2018-09-04 NOTE — Progress Notes (Signed)
To recovery, report to RN, VSS. 

## 2018-09-05 ENCOUNTER — Telehealth: Payer: Self-pay

## 2018-09-05 NOTE — Telephone Encounter (Signed)
  Follow up Call-  Call back number 09/04/2018  Post procedure Call Back phone  # [phone]  Permission to leave phone message Yes  Some recent data might be hidden     Patient questions:  Do you have a fever, pain , or abdominal swelling? No. Pain Score  0 *  Have you tolerated food without any problems? Yes.    Have you been able to return to your normal activities? Yes.    Do you have any questions about your discharge instructions: Diet   No. Medications  No. Follow up visit  No.  Do you have questions or concerns about your Care? No.  Actions: * If pain score is 4 or above: No action needed, pain <4.

## 2018-09-13 ENCOUNTER — Telehealth: Payer: Self-pay | Admitting: Internal Medicine

## 2018-09-13 DIAGNOSIS — K219 Gastro-esophageal reflux disease without esophagitis: Secondary | ICD-10-CM

## 2018-09-13 DIAGNOSIS — R1013 Epigastric pain: Secondary | ICD-10-CM

## 2018-09-13 NOTE — Telephone Encounter (Signed)
I still think we should make a switch and would try omeprazole 40 mg qd before breakfast same sig as on the pantoprazole and same refills  Maybe that will not be too expensive

## 2018-09-13 NOTE — Telephone Encounter (Signed)
I called and left a detailed message for Darren and told him to call me back with which pharmacy he wants me to send it in to.

## 2018-09-13 NOTE — Telephone Encounter (Signed)
Please advise Sir if okay with you, thank you.

## 2018-09-14 MED ORDER — ESOMEPRAZOLE MAGNESIUM 40 MG PO CPDR: 40.0000 mg | DELAYED_RELEASE_CAPSULE | Freq: Every day | ORAL | 3 refills | Status: DC

## 2018-09-14 NOTE — Telephone Encounter (Signed)
After speaking with patient and checking the Goodrx app he will go to [facility] and get the generic Nexium much cheaper than the [facility]. His wife works there. If any problem with this he will call us back but looks like its going to run $31.23 for his 90 day supply. He was reluctant to switch to the omeprazole as he remembers being told that it caused his blood count to drop. He thinks this was in 2014.

## 2018-11-13 ENCOUNTER — Encounter: Payer: Self-pay | Admitting: Internal Medicine

## 2018-11-13 ENCOUNTER — Ambulatory Visit: Payer: BC Managed Care – PPO | Admitting: Internal Medicine

## 2018-11-13 ENCOUNTER — Ambulatory Visit (INDEPENDENT_AMBULATORY_CARE_PROVIDER_SITE_OTHER)
Admission: RE | Admit: 2018-11-13 | Discharge: 2018-11-13 | Disposition: A | Discharge status: Home / Self Care | Payer: BC Managed Care – PPO | Source: Ambulatory Visit | Attending: Internal Medicine | Admitting: Internal Medicine

## 2018-11-13 VITALS — BP 144/98 | HR 91 | Temp 98.4°F | Resp 20 | Ht 69.0 in | Wt 268.8 lb

## 2018-11-13 DIAGNOSIS — R059 Cough, unspecified: Secondary | ICD-10-CM

## 2018-11-13 DIAGNOSIS — R05 Cough: Secondary | ICD-10-CM

## 2018-11-13 DIAGNOSIS — J45901 Unspecified asthma with (acute) exacerbation: Secondary | ICD-10-CM | POA: Diagnosis not present

## 2018-11-13 LAB — POCT EXHALED NITRIC OXIDE: FENO LEVEL (PPB): 39

## 2018-11-13 MED ORDER — MOMETASONE FURO-FORMOTEROL FUM 100-5 MCG/ACT IN AERO: 2.0000 | INHALATION_SPRAY | Freq: Two times a day (BID) | RESPIRATORY_TRACT | 5 refills | Status: DC

## 2018-11-13 MED ORDER — METHYLPREDNISOLONE ACETATE 80 MG/ML IJ SUSP
120.0000 mg | Freq: Once | INTRAMUSCULAR | Status: AC
  Administered 2018-11-13: 120 mg via INTRAMUSCULAR

## 2018-11-13 MED ORDER — HYDROCODONE-HOMATROPINE 5-1.5 MG/5ML PO SYRP: 5.0000 mL | ORAL_SOLUTION | Freq: Three times a day (TID) | ORAL | 0 refills | Status: DC | PRN

## 2018-11-13 NOTE — Progress Notes (Signed)
Subjective:  Patient ID: Taylor Schroeder, male    DOB: 20-Jan-1966  Age: 53 y.o. MRN: [id number]  CC: Cough and Asthma   HPI Taylor Schroeder presents for a 3-day history of nonproductive cough, sore throat, wheezing, and shortness of breath.  He has a remote history of asthma.  Outpatient Medications Prior to Visit  Medication Sig Dispense Refill  . CVS D3 2000 units CAPS TAKE 1 CAPSULE BY MOUTH EVERY DAY 90 capsule 1  . diclofenac sodium (VOLTAREN) 1 % GEL Apply 4 g topically 4 (four) times daily. 4 Tube 0  . esomeprazole (NEXIUM) 40 MG capsule Take 1 capsule (40 mg total) by mouth daily before breakfast. 90 capsule 3  . tamsulosin (FLOMAX) 0.4 MG CAPS capsule     . telmisartan (MICARDIS) 80 MG tablet TAKE 1 TABLET BY MOUTH EVERY DAY 90 tablet 1  . 0.9 %  sodium chloride infusion      No facility-administered medications prior to visit.     ROS Review of Systems  Constitutional: Negative for chills, diaphoresis, fatigue and fever.  HENT: Positive for sore throat. Negative for facial swelling, sinus pressure, sinus pain, trouble swallowing and voice change.   Respiratory: Positive for cough, shortness of breath and wheezing. Negative for chest tightness and stridor.   Cardiovascular: Negative for chest pain, palpitations and leg swelling.  Gastrointestinal: Negative for abdominal pain, constipation, diarrhea, nausea and vomiting.  Genitourinary: Negative.  Negative for difficulty urinating and dysuria.  Musculoskeletal: Negative.  Negative for arthralgias and myalgias.  Skin: Negative.  Negative for color change.  Neurological: Negative.  Negative for dizziness, weakness and headaches.  Hematological: Negative for adenopathy. Does not bruise/bleed easily.  Psychiatric/Behavioral: Negative.     Objective:  BP (!) 144/98 (BP Location: Left Arm, Patient Position: Sitting, Cuff Size: Large)   Pulse 91   Temp 98.4 F (36.9 C) (Oral)   Resp 20   Ht 5\' 9"  (1.753 m)   Wt 268 lb 12  oz (121.9 kg)   SpO2 95%   BMI 39.69 kg/m   BP Readings from Last 3 Encounters:  11/13/18 (!) 144/98  09/04/18 137/86  08/16/18 140/86    Wt Readings from Last 3 Encounters:  11/13/18 268 lb 12 oz (121.9 kg)  09/04/18 271 lb (122.9 kg)  08/16/18 271 lb 9.6 oz (123.2 kg)    Physical Exam Vitals signs reviewed.  Constitutional:      General: He is not in acute distress.    Appearance: He is obese. He is not ill-appearing, toxic-appearing or diaphoretic.  HENT:     Nose: Nose normal. No congestion or rhinorrhea.     Mouth/Throat:     Lips: No lesions.     Mouth: Mucous membranes are moist.     Pharynx: Oropharynx is clear. Posterior oropharyngeal erythema present. No pharyngeal swelling or oropharyngeal exudate.     Tonsils: No tonsillar exudate or tonsillar abscesses.  Eyes:     General: No scleral icterus.    Conjunctiva/sclera: Conjunctivae normal.  Neck:     Musculoskeletal: Normal range of motion and neck supple. No muscular tenderness.  Cardiovascular:     Rate and Rhythm: Normal rate and regular rhythm.     Pulses: Normal pulses.     Heart sounds: No murmur. No gallop.   Pulmonary:     Breath sounds: No stridor. Examination of the right-middle field reveals rhonchi. Examination of the left-middle field reveals rhonchi. Rhonchi present. No decreased breath sounds, wheezing or rales.  Chest:     Chest wall: No tenderness.  Abdominal:     Palpations: There is no hepatomegaly or splenomegaly.     Tenderness: There is no abdominal tenderness.     Hernia: No hernia is present.  Musculoskeletal: Normal range of motion.        General: No swelling.     Right lower leg: No edema.     Left lower leg: No edema.  Lymphadenopathy:     Cervical: No cervical adenopathy.  Skin:    General: Skin is warm and dry.     Coloration: Skin is not pale.     Findings: No erythema.  Neurological:     General: No focal deficit present.     Mental Status: He is oriented to person,  place, and time. Mental status is at baseline.     Lab Results  Component Value Date   WBC 7.1 07/03/2018   HGB 14.7 07/03/2018   HCT 42.6 07/03/2018   PLT 262.0 07/03/2018   GLUCOSE 96 07/03/2018   CHOL 191 12/21/2017   TRIG 153.0 (H) 12/21/2017   HDL 50.50 12/21/2017   LDLDIRECT 144.0 12/21/2016   LDLCALC 110 (H) 12/21/2017   ALT 46 07/03/2018   AST 25 07/03/2018   NA 138 07/03/2018   K 4.3 07/03/2018   CL 103 07/03/2018   CREATININE 1.00 07/03/2018   BUN 18 07/03/2018   CO2 29 07/03/2018   TSH 2.25 12/21/2017   PSA 2.67 12/21/2017   INR 0.95 09/07/2017   HGBA1C 5.4 12/21/2017    Ct Abdomen Pelvis W Contrast  Result Date: 07/05/2018 CLINICAL DATA:  Abdominal pain and nausea EXAM: CT ABDOMEN AND PELVIS WITH CONTRAST TECHNIQUE: Multidetector CT imaging of the abdomen and pelvis was performed using the standard protocol following bolus administration of intravenous contrast. Oral contrast was also administered. CONTRAST:  100 mL ISOVUE-300 IOPAMIDOL (ISOVUE-300) INJECTION 61% COMPARISON:  December 15, 2016 FINDINGS: Lower chest: Lung bases are clear. Hepatobiliary: There is hepatic steatosis. No focal liver lesions are appreciable. Gallbladder is absent. There is no biliary duct dilatation. Pancreas: No pancreatic mass or inflammatory focus evident. Spleen: No splenic lesions appreciable. Adrenals/Urinary Tract: Adrenals bilaterally appear unremarkable. Kidneys bilaterally show no evident mass or hydronephrosis on either side. There is no evident renal or ureteral calculus on either side. Urinary bladder is midline. There is mild thickening of the urinary bladder wall. Stomach/Bowel: There is no appreciable bowel wall or mesenteric thickening. There is no evident bowel obstruction. There is no free air or portal venous air. Vascular/Lymphatic: There is no abdominal aortic aneurysm. There is aortoiliac atherosclerosis. Major mesenteric arterial vessels appear patent. There is no evident  adenopathy in the abdomen or pelvis. Reproductive: Prostate and seminal vesicles appear normal in size and contour. There is a tiny prostatic calculus. No pelvic mass evident. Other: Appendix absent. There is no abscess or ascites in the abdomen or pelvis. There is a minimal ventral hernia containing only fat. Musculoskeletal: There is degenerative change in the lower lumbar spine region. There are no blastic or lytic bone lesions. There is no intramuscular lesion evident. IMPRESSION: 1. No evident bowel obstruction. No abscess in the abdomen or pelvis. Appendix absent. 2. There is thickening of the urinary bladder wall, a finding concerning for a degree of cystitis. Correlation with urinalysis advised in this regard. 3. No renal or ureteral calculus on either side. No hydronephrosis. Small prostatic calculus noted. 4.  Hepatic steatosis. 5.  Gallbladder absent. 6.  There is  aortoiliac atherosclerosis. 7.  Minimal ventral hernia containing only fat. Aortic Atherosclerosis (ICD10-I70.0). Electronically Signed   By: Lowella Grip III M.D.   On: 07/05/2018 09:37   Dg Chest 2 View  Result Date: 11/13/2018 CLINICAL DATA:  Cough and upper chest pain for 3 days, history hypertension, coronary artery disease, former smoker EXAM: CHEST - 2 VIEW COMPARISON:  05/30/2018 FINDINGS: Normal heart size, mediastinal contours, and pulmonary vascularity. Chronic bronchitic changes without infiltrate, pleural effusion or pneumothorax. Bones unremarkable. IMPRESSION: Chronic bronchitic changes without infiltrate. Electronically Signed   By: Lavonia Dana M.D.   On: 11/13/2018 11:47     Assessment & Plan:   Lyndel was seen today for cough and asthma.  Diagnoses and all orders for this visit:  Cough- His chest x-ray is negative for mass or infiltrate.  His symptoms are consistent with a viral URI.  I recommended that he try Hycodan as needed for the cough.  Also, he has an elevated FeNO score so I will treat him for acute  asthma exacerbation as well. -     DG Chest 2 View; Future -     POCT EXHALED NITRIC OXIDE -     HYDROcodone-homatropine (HYCODAN) 5-1.5 MG/5ML syrup; Take 5 mLs by mouth every 8 (eight) hours as needed for cough.  Moderate asthma with exacerbation, unspecified whether persistent- I gave him a sample of Dulera and showed him how to use it.  He demonstrated proficiency with its use. -     methylPREDNISolone acetate (DEPO-MEDROL) injection 120 mg -     mometasone-formoterol (DULERA) 100-5 MCG/ACT AERO; Inhale 2 puffs into the lungs 2 (two) times daily.   I am having Taylor Schroeder "Darren" start on mometasone-formoterol and HYDROcodone-homatropine. I am also having him maintain his diclofenac sodium, telmisartan, CVS D3, esomeprazole, and tamsulosin. We will stop administering sodium chloride. Additionally, we administered methylPREDNISolone acetate.  Meds ordered this encounter  Medications  . methylPREDNISolone acetate (DEPO-MEDROL) injection 120 mg  . mometasone-formoterol (DULERA) 100-5 MCG/ACT AERO    Sig: Inhale 2 puffs into the lungs 2 (two) times daily.    Dispense:  13 g    Refill:  5  . HYDROcodone-homatropine (HYCODAN) 5-1.5 MG/5ML syrup    Sig: Take 5 mLs by mouth every 8 (eight) hours as needed for cough.    Dispense:  120 mL    Refill:  0     Follow-up: Return in about 4 weeks (around 12/11/2018).  Scarlette Calico, MD

## 2018-11-13 NOTE — Patient Instructions (Signed)
Cough, Adult  Coughing is a reflex that clears your throat and your airways. Coughing helps to heal and protect your lungs. It is normal to cough occasionally, but a cough that happens with other symptoms or lasts a long time may be a sign of a condition that needs treatment. A cough may last only 2-3 weeks (acute), or it may last longer than 8 weeks (chronic). What are the causes? Coughing is commonly caused by:  Breathing in substances that irritate your lungs.  A viral or bacterial respiratory infection.  Allergies.  Asthma.  Postnasal drip.  Smoking.  Acid backing up from the stomach into the esophagus (gastroesophageal reflux).  Certain medicines.  Chronic lung problems, including COPD (or rarely, lung cancer).  Other medical conditions such as heart failure. Follow these instructions at home: Pay attention to any changes in your symptoms. Take these actions to help with your discomfort:  Take medicines only as told by your health care provider. ? If you were prescribed an antibiotic medicine, take it as told by your health care provider. Do not stop taking the antibiotic even if you start to feel better. ? Talk with your health care provider before you take a cough suppressant medicine.  Drink enough fluid to keep your urine clear or pale yellow.  If the air is dry, use a cold steam vaporizer or humidifier in your bedroom or your home to help loosen secretions.  Avoid anything that causes you to cough at work or at home.  If your cough is worse at night, try sleeping in a semi-upright position.  Avoid cigarette smoke. If you smoke, quit smoking. If you need help quitting, ask your health care provider.  Avoid caffeine.  Avoid alcohol.  Rest as needed. Contact a health care provider if:  You have new symptoms.  You cough up pus.  Your cough does not get better after 2-3 weeks, or your cough gets worse.  You cannot control your cough with suppressant  medicines and you are losing sleep.  You develop pain that is getting worse or pain that is not controlled with pain medicines.  You have a fever.  You have unexplained weight loss.  You have night sweats. Get help right away if:  You cough up blood.  You have difficulty breathing.  Your heartbeat is very fast. This information is not intended to replace advice given to you by your health care provider. Make sure you discuss any questions you have with your health care provider. Document Released: 03/12/2011 Document Revised: 02/19/2016 Document Reviewed: 11/20/2014 Elsevier Interactive Patient Education  2019 Elsevier Inc.  

## 2018-11-20 ENCOUNTER — Ambulatory Visit: Payer: BC Managed Care – PPO | Admitting: Family

## 2018-11-20 ENCOUNTER — Encounter: Payer: Self-pay | Admitting: Family

## 2018-11-20 ENCOUNTER — Ambulatory Visit (INDEPENDENT_AMBULATORY_CARE_PROVIDER_SITE_OTHER)
Admission: RE | Admit: 2018-11-20 | Discharge: 2018-11-20 | Disposition: A | Discharge status: Home / Self Care | Payer: BC Managed Care – PPO | Source: Ambulatory Visit | Attending: Family | Admitting: Family

## 2018-11-20 ENCOUNTER — Other Ambulatory Visit (INDEPENDENT_AMBULATORY_CARE_PROVIDER_SITE_OTHER): Payer: BC Managed Care – PPO

## 2018-11-20 ENCOUNTER — Ambulatory Visit: Payer: Self-pay | Admitting: Internal Medicine

## 2018-11-20 VITALS — BP 148/88 | HR 105 | Temp 98.1°F | Ht 69.0 in | Wt 258.0 lb

## 2018-11-20 DIAGNOSIS — R059 Cough, unspecified: Secondary | ICD-10-CM

## 2018-11-20 DIAGNOSIS — R531 Weakness: Secondary | ICD-10-CM

## 2018-11-20 DIAGNOSIS — R05 Cough: Secondary | ICD-10-CM

## 2018-11-20 DIAGNOSIS — R0781 Pleurodynia: Secondary | ICD-10-CM

## 2018-11-20 LAB — COMPREHENSIVE METABOLIC PANEL
ALBUMIN: 4.3 g/dL (ref 3.5–5.2)
ALT: 51 U/L (ref 0–53)
AST: 30 U/L (ref 0–37)
Alkaline Phosphatase: 67 U/L (ref 39–117)
BUN: 25 mg/dL — ABNORMAL HIGH (ref 6–23)
CO2: 23 mEq/L (ref 19–32)
Calcium: 9.4 mg/dL (ref 8.4–10.5)
Chloride: 100 mEq/L (ref 96–112)
Creatinine, Ser: 1.46 mg/dL (ref 0.40–1.50)
GFR: 50.4 mL/min — ABNORMAL LOW (ref >60.00)
Glucose, Bld: 109 mg/dL — ABNORMAL HIGH (ref 70–99)
POTASSIUM: 3.7 meq/L (ref 3.5–5.1)
Sodium: 134 mEq/L — ABNORMAL LOW (ref 135–145)
Total Bilirubin: 0.6 mg/dL (ref 0.2–1.2)
Total Protein: 7.7 g/dL (ref 6.0–8.3)

## 2018-11-20 LAB — CBC WITH DIFFERENTIAL/PLATELET
BASOS ABS: 0.1 10*3/uL (ref 0.0–0.1)
Basophils Relative: 0.5 % (ref 0.0–3.0)
Eosinophils Absolute: 0.2 10*3/uL (ref 0.0–0.7)
Eosinophils Relative: 2.4 % (ref 0.0–5.0)
HEMATOCRIT: 46.1 % (ref 39.0–52.0)
Hemoglobin: 16.3 g/dL (ref 13.0–17.0)
LYMPHS PCT: 31.9 % (ref 12.0–46.0)
Lymphs Abs: 3.2 10*3/uL (ref 0.7–4.0)
MCHC: 35.3 g/dL (ref 30.0–36.0)
MCV: 87.2 fl (ref 78.0–100.0)
Monocytes Absolute: 1.1 10*3/uL — ABNORMAL HIGH (ref 0.1–1.0)
Monocytes Relative: 11.2 % (ref 3.0–12.0)
Neutro Abs: 5.5 10*3/uL (ref 1.4–7.7)
Neutrophils Relative %: 54 % (ref 43.0–77.0)
Platelets: 288 10*3/uL (ref 150.0–400.0)
RBC: 5.28 Mil/uL (ref 4.22–5.81)
RDW: 13.3 % (ref 11.5–15.5)
WBC: 10.1 10*3/uL (ref 4.0–10.5)

## 2018-11-20 MED ORDER — METHYLPREDNISOLONE ACETATE 40 MG/ML IJ SUSP
40.0000 mg | Freq: Once | INTRAMUSCULAR | Status: AC
  Administered 2018-11-20: 40 mg via INTRAMUSCULAR

## 2018-11-20 MED ORDER — DOXYCYCLINE HYCLATE 100 MG PO TABS: 100.0000 mg | ORAL_TABLET | Freq: Two times a day (BID) | ORAL | 0 refills | Status: DC

## 2018-11-20 MED ORDER — IPRATROPIUM-ALBUTEROL 0.5-2.5 (3) MG/3ML IN SOLN
3.0000 mL | Freq: Once | RESPIRATORY_TRACT | Status: AC
  Administered 2018-11-20: 3 mL via RESPIRATORY_TRACT

## 2018-11-20 MED ORDER — PREDNISONE 20 MG PO TABS: 40.0000 mg | ORAL_TABLET | Freq: Every day | ORAL | 0 refills | Status: DC

## 2018-11-20 NOTE — Progress Notes (Signed)
Taylor Schroeder is a 53 y.o. male with the following history as recorded in Epic[employer]:  Patient Active Problem List   Diagnosis Date Noted  . Seasonal allergic rhinitis due to pollen 04/11/2018  . Vitamin D deficiency 12/21/2017  . 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 Sleep apnea 03/08/2017  . Hyperlipidemia with target LDL less than 100 12/22/2016  . Cough 07/13/2016  . Primary erectile dysfunction 11/18/2014  . Essential hypertension 07/15/2014  . DDD (degenerative disc disease), cervical 10/03/2013  . Esophageal ring, acquired 07/12/2013  . Crohn's ileitis - working dx 06/01/2013  . BPH (benign prostatic hyperplasia) 04/18/2013  . Pernicious anemia-B12 deficiency 04/18/2013  . Severe obesity (BMI >= 40) ([place]) 12/21/2012  . GERD (gastroesophageal reflux disease) 12/21/2012  . Hyperglycemia 08/29/2012  . Routine general medical examination at a health care facility 08/29/2012    Current Outpatient Medications  Medication Sig Dispense Refill  . [employer] D3 2000 units CAPS TAKE 1 CAPSULE BY MOUTH EVERY DAY 90 capsule 1  . diclofenac sodium (VOLTAREN) 1 % GEL Apply 4 g topically 4 (four) times daily. 4 Tube 0  . esomeprazole (NEXIUM) 40 MG capsule Take 1 capsule (40 mg total) by mouth daily before breakfast. 90 capsule 3  . HYDROcodone-homatropine (HYCODAN) 5-1.5 MG/5ML syrup Take 5 mLs by mouth every 8 (eight) hours as needed for cough. 120 mL 0  . mometasone-formoterol (DULERA) 100-5 MCG/ACT AERO Inhale 2 puffs into the lungs 2 (two) times daily. 13 g 5  . tamsulosin (FLOMAX) 0.4 MG CAPS capsule     . telmisartan (MICARDIS) 80 MG tablet TAKE 1 TABLET BY MOUTH EVERY DAY 90 tablet 1  . doxycycline (VIBRA-TABS) 100 MG tablet Take 1 tablet (100 mg total) by mouth 2 (two) times daily. 20 tablet 0  . predniSONE (DELTASONE) 20 MG tablet Take 2 tablets (40 mg total) by mouth daily with breakfast. 10 tablet 0   No current facility-administered medications for this visit.      Allergies: Allegra [fexofenadine hcl]; Hctz [hydrochlorothiazide]; and Demerol [meperidine]  Past Medical History:  Diagnosis Date  . Arthritis    "qwhere" (02/03/2017)  . BPH (benign prostatic hyperplasia)   . Childhood asthma    "as a baby"  . Coronary artery disease Non-obstructive   a. nonobs cath in 2006;  b. 01/2013 Cath: LM nl, LAD 86m LCX nl, RCA non-dom, nl. EF 60-65%.  . Esophageal ring, acquired 07/12/2013  . GERD (gastroesophageal reflux disease)   . H/O hiatal hernia    "think so" (02/06/2014)  . Headache    "resolved w/BP control in 11/2015" (02/03/2017)  . High cholesterol   . Hypertension   . Ileitis 2014   Suspected Crohn's, think not as time has gone on  . Obesity   . Pernicious anemia-B12 deficiency 04/18/2013  . PONV (postoperative nausea and vomiting) 02/03/2017; 08/2016  . Sleep apnea     Past Surgical History:  Procedure Laterality Date  . APPENDECTOMY    . CARDIAC CATHETERIZATION  01/2013   "results were clear"  . CARPAL TUNNEL RELEASE Right   . CHOLECYSTECTOMY N/A 02/03/2017   Procedure: LAPAROSCOPIC CHOLECYSTECTOMY;  Surgeon: WGreer Pickerel MD;  [facility] [facility]  Service: General;  Laterality: N/A;  . COLONOSCOPY  ~ 2014  . ESOPHAGOGASTRODUODENOSCOPY  ~ 2014  . INGUINAL HERNIA REPAIR  1966   "? side"  . KNEE ARTHROPLASTY Left 02/06/2014   Procedure: COMPUTER ASSISTED TOTAL KNEE ARTHROPLASTY;  Surgeon: MMarybelle Killings MD;  [facility]  Service: Orthopedics;  Laterality: Left;  Cemented Left Total Knee Arthroplasty  . KNEE ARTHROSCOPY Left 08/2016   scar tissue cleaned out  . LAPAROSCOPIC CHOLECYSTECTOMY  02/03/2017  . LEFT HEART CATHETERIZATION WITH CORONARY ANGIOGRAM N/A 01/29/2013   Procedure: LEFT HEART CATHETERIZATION WITH CORONARY ANGIOGRAM;  Surgeon: Thayer Headings, MD;  Location[facility];  Service: Cardiovascular;  Laterality: N/A;  . MULTIPLE TOOTH EXTRACTIONS  09/2016  . SHOULDER ARTHROSCOPY Left 03/12/2013  . TOTAL KNEE ARTHROPLASTY Left  02/06/2014  . TOTAL KNEE ARTHROPLASTY Right 09/14/2017  . TOTAL KNEE ARTHROPLASTY Right 09/14/2017   Procedure: RIGHT TOTAL KNEE ARTHROPLASTY;  Surgeon: [name], MD;  Location: [facility];  Service: Orthopedics;  Laterality: Right;    Family History  Problem Relation Age of Onset  . Hypertension Mother   . Cancer Father        type unknown  . COPD Other   . Emphysema Maternal Grandmother   . Emphysema Maternal Grandfather   . Other Paternal Grandmother        spinal cancer  . Alcohol abuse Neg Hx   . Diabetes Neg Hx   . Early death Neg Hx   . Heart disease Neg Hx   . Hyperlipidemia Neg Hx   . Kidney disease Neg Hx   . Stroke Neg Hx   . Colon cancer Neg Hx   . Stomach cancer Neg Hx   . Esophageal cancer Neg Hx   . Rectal cancer Neg Hx   . Liver cancer Neg Hx     Social History   Tobacco Use  . Smoking status: Former Smoker    Packs/day: 2.00    Years: 30.00    Pack years: 60.00    Types: Cigarettes    Last attempt to quit: 08/29/2008    Years since quitting: 10.2  . Smokeless tobacco: Never Used  Substance Use Topics  . Alcohol use: No    Comment: 02/03/2017 "maybe 12 pack of beer all at one time but only once/year"; 02/06/2014 "couple beers a couple times/month"    Subjective:  Patient presents with concerns for follow-up on cough; was seen last Monday- thought to have viral URI; CXR was done and showed chronic bronchitis changes without infiltrate; was discharged on [place];  States that his grandson was diagnosed with flu last Tuesday; patient states had been feeling better until last Friday- came home with extreme chills/ cough; does not think he has run a fever since last Friday; notes that rib and back are extremely painful; states that he coughed so hard yesterday, he "passed out" twice yesterday;     Objective:  Vitals:   11/20/18 0939  BP: (!) 148/88  Pulse: (!) 105  Temp: 98.1 F (36.7 C)  TempSrc: Oral  SpO2: 96%  Weight: 258 lb (117 kg)   Height: _0  (1.753 m)    General: Well developed, well nourished, in no acute distress ; pale appearing Skin : Warm and dry.  Head: Normocephalic and atraumatic  Eyes: Sclera and conjunctiva clear; pupils round and reactive to light; extraocular movements intact  Ears: External normal; canals clear; tympanic membranes normal  Oropharynx: Pink, supple. No suspicious lesions  Neck: Supple without thyromegaly, adenopathy  Lungs: Respirations unlabored; coarse breath sounds CVS exam: normal rate and regular rhythm.  Neurologic: Alert and oriented; speech intact; face symmetrical; moves all extremities well; CNII-XII intact without focal deficit   Assessment:  1. Cough   2. Rib pain   3.  Weakness     Plan:  STAT CXR and CBC, CMP done- results are normal/ reassuring; Duo-neb given in office with limited benefit; Depo-Medrol IM 40 mg given today; start Doxycycline 100 mg bid x 10 days and add oral steroids tomorrow x 5 days; increase fluids, rest; continue Hycodan as needed; work note given for this week; follow-up worse, no better.   No follow-ups on file.  Orders Placed This Encounter  Procedures  . DG Chest 2 View    Standing Status:   Future    Number of Occurrences:   1    Standing Expiration Date:   01/19/2020    Order Specific Question:   Reason for Exam (SYMPTOM  OR DIAGNOSIS REQUIRED)    Answer:   cough/ chest pain    Order Specific Question:   Preferred imaging location?    Answer:   [facility]    Order Specific Question:   Radiology Contrast Protocol - do NOT remove file path    Answer:   \\[email or web address]  . DG Ribs Unilateral Left    Standing Status:   Future    Number of Occurrences:   1    Standing Expiration Date:   01/19/2020    Order Specific Question:   Reason for Exam (SYMPTOM  OR DIAGNOSIS REQUIRED)    Answer:   left rib pain    Order Specific Question:   Preferred imaging location?    Answer:   [facility]     Order Specific Question:   Radiology Contrast Protocol - do NOT remove file path    Answer:   \\[email or web address]s.[email or web address]  . CBC w/Diff    Standing Status:   Future    Number of Occurrences:   1    Standing Expiration Date:   11/20/2019  . Comp Met (CMET)    Standing Status:   Future    Number of Occurrences:   1    Standing Expiration Date:   11/20/2019    Requested Prescriptions   Signed Prescriptions Disp Refills  . doxycycline (VIBRA-TABS) 100 MG tablet 20 tablet 0    Sig: Take 1 tablet (100 mg total) by mouth 2 (two) times daily.  . predniSONE (DELTASONE) 20 MG tablet 10 tablet 0    Sig: Take 2 tablets (40 mg total) by mouth daily with breakfast.

## 2018-11-20 NOTE — Progress Notes (Signed)
Reviewed with patient in office;

## 2018-11-20 NOTE — Progress Notes (Signed)
No rib fracture; patient aware

## 2018-11-20 NOTE — Progress Notes (Signed)
Reviewed with patient; he is encouraged to work on hydration.

## 2018-11-20 NOTE — Telephone Encounter (Signed)
He called in c/o shortness of breath and coughing.   He saw Dr. Ronnald Ramp on 11/13/2018 for coughing and URI symptoms.   His grandson had the flu and he was exposed to his grandson.   His whole family has been sick with URIs.   He has asthma.   He is not getting better.  The agent made him an appt with Jodi Mourning, FNP for today at 9:40.    I instructed him to go to the ED if he became worse or experienced increasing shortness of breath.   He has coughed so much his whole chest is sore.  I forwarded my triage notes to the office.    Reason for Disposition . [1] Continuous (nonstop) coughing AND [2] keeps from working or sleeping  Answer Assessment - Initial Assessment Questions 1. RESPIRATORY STATUS: "Describe your breathing?" (e.g., wheezing, shortness of breath, unable to speak, severe coughing)      Last week I saw Dr. Ronnald Ramp on 11/13/2018.     My son got the flu.   I was getting better then I've started feeling bad again.    I've coughed so much.   My middle of my body hurts from coughing.    I passed out yesterday from coughing thank goodness I sitting down. 2. ONSET: "When did this breathing problem begin?"      Saturday evening. 3. PATTERN "Does the difficult breathing come and go, or has it been constant since it started?"      Constant.    I've used all kinds of OTC cough medicine and the cough medicine the dr gave me.    I have asthma and my inhalers are not helping. 4. SEVERITY: "How bad is your breathing?" (e.g., mild, moderate, severe)    - MILD: No SOB at rest, mild SOB with walking, speaks normally in sentences, can lay down, no retractions, pulse < 100.    - MODERATE: SOB at rest, SOB with minimal exertion and prefers to sit, cannot lie down flat, speaks in phrases, mild retractions, audible wheezing, pulse 100-120.    - SEVERE: Very SOB at rest, speaks in single words, struggling to breathe, sitting hunched forward, retractions, pulse > 120      Having a hard time eating and  drinking.   No appetite.   I'm drinking juice and coffee.   5. RECURRENT SYMPTOM: "Have you had difficulty breathing before?" If so, ask: "When was the last time?" and "What happened that time?"      No 6. CARDIAC HISTORY: "Do you have any history of heart disease?" (e.g., heart attack, angina, bypass surgery, angioplasty)      No 7. LUNG HISTORY: "Do you have any history of lung disease?"  (e.g., pulmonary embolus, asthma, emphysema)     Has asthma 8. CAUSE: "What do you think is causing the breathing problem?"      I was exposed to my grandson who has the flu.    My whole family has been sick. 9. OTHER SYMPTOMS: "Do you have any other symptoms? (e.g., dizziness, runny nose, cough, chest pain, fever)     My chest hurts so bad when I cough. 10. PREGNANCY: "Is there any chance you are pregnant?" "When was your last menstrual period?"       N/A 11. TRAVEL: "Have you traveled out of the country in the last month?" (e.g., travel history, exposures)       No  Protocols used: BREATHING DIFFICULTY-A-AH

## 2018-12-13 ENCOUNTER — Other Ambulatory Visit: Payer: Self-pay | Admitting: Internal Medicine

## 2018-12-13 DIAGNOSIS — I10 Essential (primary) hypertension: Secondary | ICD-10-CM

## 2019-01-03 ENCOUNTER — Ambulatory Visit (INDEPENDENT_AMBULATORY_CARE_PROVIDER_SITE_OTHER): Payer: BC Managed Care – PPO | Admitting: Internal Medicine

## 2019-01-03 ENCOUNTER — Encounter: Payer: Self-pay | Admitting: Internal Medicine

## 2019-01-03 DIAGNOSIS — J309 Allergic rhinitis, unspecified: Secondary | ICD-10-CM

## 2019-01-03 DIAGNOSIS — H1010 Acute atopic conjunctivitis, unspecified eye: Secondary | ICD-10-CM | POA: Diagnosis not present

## 2019-01-03 DIAGNOSIS — J0100 Acute maxillary sinusitis, unspecified: Secondary | ICD-10-CM

## 2019-01-03 MED ORDER — METHYLPREDNISOLONE 4 MG PO TBPK: ORAL_TABLET | ORAL | 0 refills | Status: AC

## 2019-01-03 MED ORDER — LEVOCETIRIZINE DIHYDROCHLORIDE 5 MG PO TABS: 5.0000 mg | ORAL_TABLET | Freq: Every evening | ORAL | 1 refills | Status: DC

## 2019-01-03 MED ORDER — AMOXICILLIN-POT CLAVULANATE 875-125 MG PO TABS: 1.0000 | ORAL_TABLET | Freq: Two times a day (BID) | ORAL | 0 refills | Status: AC

## 2019-01-03 NOTE — Progress Notes (Signed)
Virtual Visit via Video Note  I connected with Taylor Schroeder on 01/03/19 at 11:00 AM EDT by a video enabled telemedicine application and verified that I am speaking with the correct person using two identifiers.   I discussed the limitations of evaluation and management by telemedicine and the availability of in person appointments. The patient expressed understanding and agreed to proceed.  History of Present Illness: He checked in today for a virtual visit.  He was not willing to come in because of the coronavirus pandemic.  He complains of a one-week history of right-sided facial pain and swelling, right upper tooth pain, nasal congestion, sneezing, itchy watery eyes, and postnasal drip.  He denies headache, nausea, vomiting, cough, fever, chills, or sore throat.  He is not getting much symptom relief with the use of Flonase nasal spray.    Observations/Objective: He was alert, oriented, and in no acute respiratory distress, gross appearance was normal.   Assessment and Plan: He is having a flareup of his seasonal allergies.  I have asked him to continue using the Flonase nasal spray and to start using an antihistamine (xyzal) as well.  Will also treat him with a 6-day course of methylprednisolone.  Additionally, he has symptoms consistent with acute bacterial maxillary sinusitis.  I will treat this with a 10-day course of Augmentin.   Follow Up Instructions: He agrees to comply with the listed medications.  He will let me know if he develops any new or worsening symptoms.  He agrees to continue using the Flonase nasal spray.    I discussed the assessment and treatment plan with the patient. The patient was provided an opportunity to ask questions and all were answered. The patient agreed with the plan and demonstrated an understanding of the instructions.   The patient was advised to call back or seek an in-person evaluation if the symptoms worsen or if the condition fails to improve as  anticipated.  I provided 25 minutes of non-face-to-face time during this encounter.   Taylor Calico, MD

## 2019-03-23 ENCOUNTER — Ambulatory Visit: Payer: Self-pay

## 2019-03-23 NOTE — Telephone Encounter (Signed)
Incoming call from Patient  With  Complaint of his Uvula swelling up   Onset was this morning.   Severity rated 10.  Patient thinks that it maybe due to CPAP.  Marland Kitchen Denies difficulty breathing.  Sore throat at the roof of mouth and nasal cavity.  Reviewed protocol and provided care advice .  Protocol recommends pt. Go to Ed, or Urgent care for further Evaluation.  Pt.  Upset.  Prefers to come to office.  Request a return call from PCP.  States that he will not go to ED.  Cant afford it.  Request return phone call.    Reason for Disposition . Swollen uvula  Answer Assessment - Initial Assessment Questions 1. ONSET: "When did the swelling start?" (e.g., minutes, hours, days)     This morning 2. LOCATION: "What is swollen?" (e.g., uvula)    Uvula 3. SEVERITY: "How swollen is it?"      10 4. CAUSE: "What do you think is causing the swelling?" (e.g., hx of angioedema, allergies)     Cpap 5. BREATHING DIFFICULTY: "Are you having difficulty breathing?"     Denies 6. OTHER SYMPTOMS: "Do you have any other symptoms?" (e.g., sore throat, gagging, drooling)    Sore throat roof of mouth nasal cavity do not .  Protocols used: [facility]

## 2019-03-23 NOTE — Telephone Encounter (Signed)
Pt scheduled with PCP on Monday

## 2019-03-26 ENCOUNTER — Encounter: Payer: Self-pay | Admitting: Internal Medicine

## 2019-03-26 ENCOUNTER — Ambulatory Visit (INDEPENDENT_AMBULATORY_CARE_PROVIDER_SITE_OTHER): Payer: BC Managed Care – PPO | Admitting: Internal Medicine

## 2019-03-26 ENCOUNTER — Other Ambulatory Visit: Payer: Self-pay

## 2019-03-26 VITALS — BP 138/88 | HR 80 | Temp 98.5°F | Resp 16 | Wt 274.0 lb

## 2019-03-26 DIAGNOSIS — I10 Essential (primary) hypertension: Secondary | ICD-10-CM | POA: Diagnosis not present

## 2019-03-26 DIAGNOSIS — J301 Allergic rhinitis due to pollen: Secondary | ICD-10-CM

## 2019-03-26 MED ORDER — TRIAMCINOLONE ACETONIDE 55 MCG/ACT NA AERO: 4.0000 | INHALATION_SPRAY | Freq: Every day | NASAL | 1 refills | Status: DC

## 2019-03-26 MED ORDER — CETIRIZINE-PSEUDOEPHEDRINE ER 5-120 MG PO TB12: 1.0000 | ORAL_TABLET | Freq: Two times a day (BID) | ORAL | 1 refills | Status: DC

## 2019-03-26 MED ORDER — MONTELUKAST SODIUM 10 MG PO TABS: 10.0000 mg | ORAL_TABLET | Freq: Every day | ORAL | 1 refills | Status: DC

## 2019-03-26 NOTE — Progress Notes (Signed)
Subjective:  Patient ID: Taylor Schroeder, male    DOB: 1966-07-17  Age: 53 y.o. MRN: [id number]  CC: Hypertension and Allergic Rhinitis    HPI JAKARIUS FLAMENCO presents for f/up - He complains of a several month history of worsening nasal congestion and postnasal drip.  He says the symptoms are interfering with his ability to use his CPAP machine.  He is not getting much symptom relief with Xyzal so he stopped taking it.  He is not currently using a steroid nasal spray or taking any decongestants.  Outpatient Medications Prior to Visit  Medication Sig Dispense Refill   diclofenac sodium (VOLTAREN) 1 % GEL Apply 4 g topically 4 (four) times daily. 4 Tube 0   levocetirizine (XYZAL) 5 MG tablet Take 1 tablet (5 mg total) by mouth every evening. 90 tablet 1   tamsulosin (FLOMAX) 0.4 MG CAPS capsule      telmisartan (MICARDIS) 80 MG tablet TAKE 1 TABLET BY MOUTH EVERY DAY 90 tablet 1   No facility-administered medications prior to visit.     ROS Review of Systems  Constitutional: Negative.  Negative for chills, fatigue and unexpected weight change.  HENT: Positive for congestion and postnasal drip. Negative for facial swelling, nosebleeds, rhinorrhea, sinus pressure, sinus pain, sneezing, sore throat, tinnitus, trouble swallowing and voice change.   Eyes: Negative for visual disturbance.  Respiratory: Negative for cough, chest tightness, shortness of breath and wheezing.   Cardiovascular: Negative for chest pain, palpitations and leg swelling.  Gastrointestinal: Negative for abdominal pain, diarrhea, nausea and vomiting.  Endocrine: Negative.   Genitourinary: Negative.  Negative for difficulty urinating.  Musculoskeletal: Negative.  Negative for arthralgias and myalgias.  Skin: Negative.  Negative for pallor.  Neurological: Negative.  Negative for dizziness and headaches.  Hematological: Negative for adenopathy. Does not bruise/bleed easily.  Psychiatric/Behavioral: Negative.      Objective:  BP 138/88 (BP Location: Left Arm, Patient Position: Sitting, Cuff Size: Large)    Pulse 80    Temp 98.5 F (36.9 C) (Oral)    Resp 16    Wt 274 lb (124.3 kg)    SpO2 97%    BMI 40.46 kg/m   BP Readings from Last 3 Encounters:  03/26/19 138/88  11/20/18 (!) 148/88  11/13/18 (!) 144/98    Wt Readings from Last 3 Encounters:  03/26/19 274 lb (124.3 kg)  11/20/18 258 lb (117 kg)  11/13/18 268 lb 12 oz (121.9 kg)    Physical Exam Constitutional:      General: He is not in acute distress.    Appearance: He is obese. He is not ill-appearing, toxic-appearing or diaphoretic.  HENT:     Nose: Mucosal edema and congestion present. No nasal tenderness or rhinorrhea.     Right Nostril: No epistaxis or occlusion.     Left Nostril: No epistaxis or occlusion.     Right Turbinates: Not enlarged, swollen or pale.     Left Turbinates: Not enlarged, swollen or pale.     Right Sinus: No maxillary sinus tenderness or frontal sinus tenderness.     Left Sinus: No maxillary sinus tenderness or frontal sinus tenderness.     Mouth/Throat:     Lips: Pink.     Mouth: Mucous membranes are moist.     Palate: No mass.     Pharynx: Oropharynx is clear. No pharyngeal swelling, oropharyngeal exudate, posterior oropharyngeal erythema or uvula swelling.     Tonsils: 0 on the right.  Eyes:  General: No scleral icterus.    Conjunctiva/sclera: Conjunctivae normal.  Neck:     Musculoskeletal: Normal range of motion and neck supple. No neck rigidity.  Cardiovascular:     Rate and Rhythm: Normal rate and regular rhythm.     Heart sounds: No murmur.  Pulmonary:     Effort: Pulmonary effort is normal.     Breath sounds: No stridor. No wheezing, rhonchi or rales.  Abdominal:     General: Abdomen is protuberant. Bowel sounds are normal. There is no distension.     Palpations: There is no hepatomegaly or splenomegaly.     Tenderness: There is no abdominal tenderness.  Musculoskeletal: Normal  range of motion.     Right lower leg: No edema.     Left lower leg: No edema.  Lymphadenopathy:     Cervical: No cervical adenopathy.  Skin:    General: Skin is warm and dry.  Neurological:     General: No focal deficit present.     Mental Status: He is alert. Mental status is at baseline.     Lab Results  Component Value Date   WBC 10.1 11/20/2018   HGB 16.3 11/20/2018   HCT 46.1 11/20/2018   PLT 288.0 11/20/2018   GLUCOSE 109 (H) 11/20/2018   CHOL 191 12/21/2017   TRIG 153.0 (H) 12/21/2017   HDL 50.50 12/21/2017   LDLDIRECT 144.0 12/21/2016   LDLCALC 110 (H) 12/21/2017   ALT 51 11/20/2018   AST 30 11/20/2018   NA 134 (L) 11/20/2018   K 3.7 11/20/2018   CL 100 11/20/2018   CREATININE 1.46 11/20/2018   BUN 25 (H) 11/20/2018   CO2 23 11/20/2018   TSH 2.25 12/21/2017   PSA 2.67 12/21/2017   INR 0.95 09/07/2017   HGBA1C 5.4 12/21/2017    Dg Chest 2 View  Result Date: 11/20/2018 CLINICAL DATA:  Cough, congestion and shortness of breath. EXAM: CHEST - 2 VIEW COMPARISON:  Chest x-ray 11/13/2018 FINDINGS: The cardiac silhouette, mediastinal and hilar contours are within normal limits and stable. Stable chronic bronchitic type lung changes, likely related to smoking. No infiltrates, edema or effusions. No worrisome pulmonary lesions. The bony thorax is intact. Stable advanced degenerative changes involving the left shoulder joint. IMPRESSION: Chronic bronchitic changes but no acute overlying pulmonary process. Electronically Signed   By: Marijo Sanes M.D.   On: 11/20/2018 11:05   Dg Ribs Unilateral Left  Result Date: 11/20/2018 CLINICAL DATA:  Right rib pain no injury EXAM: LEFT RIBS - 2 VIEW COMPARISON:  Chest two-view 11/20/2018 FINDINGS: Negative for left rib fracture or lesion. Severe degenerative change left shoulder joint. IMPRESSION: Negative for left rib fracture Advanced degenerative change left shoulder . Electronically Signed   By: Franchot Gallo M.D.   On: 11/20/2018  11:07    Assessment & Plan:   Faust was seen today for hypertension and allergic rhinitis .  Diagnoses and all orders for this visit:  Seasonal allergic rhinitis due to pollen- Will continue an oral antihistamine.  I have also asked him to add pseudoephedrine, montelukast, and a steroid nasal spray. -     cetirizine-pseudoephedrine (ZYRTEC-D ALLERGY & CONGESTION) 5-120 MG tablet; Take 1 tablet by mouth 2 (two) times daily. -     montelukast (SINGULAIR) 10 MG tablet; Take 1 tablet (10 mg total) by mouth at bedtime. -     triamcinolone (NASACORT ALLERGY 24HR) 55 MCG/ACT AERO nasal inhaler; Place 4 sprays into the nose daily.  Essential hypertension- His blood  pressure is adequately well controlled but I am going to recommend that he take a decongestants for nasal congestion.  I have therefore asked him return in 1 to 2 months for me to recheck his blood pressure.   I am having Vicente Serene D. Schlagel "Darren" start on cetirizine-pseudoephedrine, montelukast, and triamcinolone. I am also having him maintain his diclofenac sodium, tamsulosin, telmisartan, and levocetirizine.  Meds ordered this encounter  Medications   cetirizine-pseudoephedrine (ZYRTEC-D ALLERGY & CONGESTION) 5-120 MG tablet    Sig: Take 1 tablet by mouth 2 (two) times daily.    Dispense:  60 tablet    Refill:  1   montelukast (SINGULAIR) 10 MG tablet    Sig: Take 1 tablet (10 mg total) by mouth at bedtime.    Dispense:  90 tablet    Refill:  1   triamcinolone (NASACORT ALLERGY 24HR) 55 MCG/ACT AERO nasal inhaler    Sig: Place 4 sprays into the nose daily.    Dispense:  3 Inhaler    Refill:  1     Follow-up: Return in about 4 weeks (around 04/23/2019).  Scarlette Calico, MD

## 2019-03-26 NOTE — Patient Instructions (Signed)

## 2019-04-25 ENCOUNTER — Encounter: Payer: BC Managed Care – PPO | Admitting: Internal Medicine

## 2019-05-03 ENCOUNTER — Encounter: Payer: Self-pay | Admitting: Internal Medicine

## 2019-05-03 ENCOUNTER — Other Ambulatory Visit: Payer: Self-pay

## 2019-05-03 ENCOUNTER — Other Ambulatory Visit (INDEPENDENT_AMBULATORY_CARE_PROVIDER_SITE_OTHER): Payer: BC Managed Care – PPO

## 2019-05-03 ENCOUNTER — Ambulatory Visit (INDEPENDENT_AMBULATORY_CARE_PROVIDER_SITE_OTHER): Payer: BC Managed Care – PPO | Admitting: Internal Medicine

## 2019-05-03 VITALS — BP 142/100 | HR 92 | Temp 97.6°F | Resp 16 | Ht 69.0 in | Wt 277.0 lb

## 2019-05-03 DIAGNOSIS — E559 Vitamin D deficiency, unspecified: Secondary | ICD-10-CM

## 2019-05-03 DIAGNOSIS — R739 Hyperglycemia, unspecified: Secondary | ICD-10-CM

## 2019-05-03 DIAGNOSIS — Z0000 Encounter for general adult medical examination without abnormal findings: Secondary | ICD-10-CM

## 2019-05-03 DIAGNOSIS — E785 Hyperlipidemia, unspecified: Secondary | ICD-10-CM

## 2019-05-03 DIAGNOSIS — D510 Vitamin B12 deficiency anemia due to intrinsic factor deficiency: Secondary | ICD-10-CM

## 2019-05-03 DIAGNOSIS — I10 Essential (primary) hypertension: Secondary | ICD-10-CM

## 2019-05-03 DIAGNOSIS — H1010 Acute atopic conjunctivitis, unspecified eye: Secondary | ICD-10-CM

## 2019-05-03 DIAGNOSIS — J309 Allergic rhinitis, unspecified: Secondary | ICD-10-CM

## 2019-05-03 LAB — CBC WITH DIFFERENTIAL/PLATELET
Basophils Absolute: 0.1 10*3/uL (ref 0.0–0.1)
Basophils Relative: 0.7 % (ref 0.0–3.0)
Eosinophils Absolute: 0.2 10*3/uL (ref 0.0–0.7)
Eosinophils Relative: 2.4 % (ref 0.0–5.0)
HCT: 40 % (ref 39.0–52.0)
Hemoglobin: 13.6 g/dL (ref 13.0–17.0)
Lymphocytes Relative: 36 % (ref 12.0–46.0)
Lymphs Abs: 3 10*3/uL (ref 0.7–4.0)
MCHC: 34 g/dL (ref 30.0–36.0)
MCV: 90.8 fl (ref 78.0–100.0)
Monocytes Absolute: 0.9 10*3/uL (ref 0.1–1.0)
Monocytes Relative: 10.3 % (ref 3.0–12.0)
Neutro Abs: 4.2 10*3/uL (ref 1.4–7.7)
Neutrophils Relative %: 50.6 % (ref 43.0–77.0)
Platelets: 243 10*3/uL (ref 150.0–400.0)
RBC: 4.41 Mil/uL (ref 4.22–5.81)
RDW: 13.3 % (ref 11.5–15.5)
WBC: 8.3 10*3/uL (ref 4.0–10.5)

## 2019-05-03 LAB — LIPID PANEL
Cholesterol: 198 mg/dL (ref 0–200)
HDL: 45.6 mg/dL (ref >39.00)
NonHDL: 152.48
Total CHOL/HDL Ratio: 4
Triglycerides: 212 mg/dL — ABNORMAL HIGH (ref 0.0–149.0)
VLDL: 42.4 mg/dL — ABNORMAL HIGH (ref 0.0–40.0)

## 2019-05-03 LAB — HEPATIC FUNCTION PANEL
ALT: 52 U/L (ref 0–53)
AST: 23 U/L (ref 0–37)
Albumin: 4.2 g/dL (ref 3.5–5.2)
Alkaline Phosphatase: 54 U/L (ref 39–117)
Bilirubin, Direct: 0.1 mg/dL (ref 0.0–0.3)
Total Bilirubin: 0.3 mg/dL (ref 0.2–1.2)
Total Protein: 6.9 g/dL (ref 6.0–8.3)

## 2019-05-03 LAB — BASIC METABOLIC PANEL
BUN: 25 mg/dL — ABNORMAL HIGH (ref 6–23)
CO2: 27 mEq/L (ref 19–32)
Calcium: 9.5 mg/dL (ref 8.4–10.5)
Chloride: 106 mEq/L (ref 96–112)
Creatinine, Ser: 1.03 mg/dL (ref 0.40–1.50)
GFR: 75.25 mL/min (ref >60.00)
Glucose, Bld: 94 mg/dL (ref 70–99)
Potassium: 3.9 mEq/L (ref 3.5–5.1)
Sodium: 140 mEq/L (ref 135–145)

## 2019-05-03 LAB — HEMOGLOBIN A1C: Hgb A1c MFr Bld: 5.6 % (ref 4.6–6.5)

## 2019-05-03 LAB — LDL CHOLESTEROL, DIRECT: Direct LDL: 134 mg/dL

## 2019-05-03 LAB — TSH: TSH: 1.77 u[IU]/mL (ref 0.35–4.50)

## 2019-05-03 MED ORDER — INDAPAMIDE 1.25 MG PO TABS: 1.2500 mg | ORAL_TABLET | Freq: Every day | ORAL | 0 refills | Status: DC

## 2019-05-03 MED ORDER — LEVOCETIRIZINE DIHYDROCHLORIDE 5 MG PO TABS: 5.0000 mg | ORAL_TABLET | Freq: Every evening | ORAL | 1 refills | Status: DC

## 2019-05-03 NOTE — Patient Instructions (Signed)

## 2019-05-03 NOTE — Progress Notes (Signed)
Subjective:  Patient ID: Taylor Schroeder, male    DOB: 11/14/1965  Age: 53 y.o. MRN: [id number]  CC: Annual Exam and Hypertension   HPI Taylor Schroeder presents for a CPX.  He complains that his blood pressure is not adequately well controlled.  He tells me he has been compliant with the ARB but he has not been working on his lifestyle modifications.  He complains of weight gain but denies headache, blurred vision, chest pain, shortness of breath, or palpitations.  He struggles with nasal allergies and has been recently taking Zyrtec-D.  It sounds like he was scheduled for prostate biopsy earlier this year but that was canceled due to the COVID-19 pandemic.  He intends to see his urologist soon about this.  He has a history of B12 and vitamin D deficiency.  He is not currently receiving a B12 or vitamin D supplement.  Outpatient Medications Prior to Visit  Medication Sig Dispense Refill   montelukast (SINGULAIR) 10 MG tablet Take 1 tablet (10 mg total) by mouth at bedtime. 90 tablet 1   tamsulosin (FLOMAX) 0.4 MG CAPS capsule      telmisartan (MICARDIS) 80 MG tablet TAKE 1 TABLET BY MOUTH EVERY DAY 90 tablet 1   triamcinolone (NASACORT ALLERGY 24HR) 55 MCG/ACT AERO nasal inhaler Place 4 sprays into the nose daily. 3 Inhaler 1   cetirizine-pseudoephedrine (ZYRTEC-D ALLERGY & CONGESTION) 5-120 MG tablet Take 1 tablet by mouth 2 (two) times daily. 60 tablet 1   diclofenac sodium (VOLTAREN) 1 % GEL Apply 4 g topically 4 (four) times daily. 4 Tube 0   levocetirizine (XYZAL) 5 MG tablet Take 1 tablet (5 mg total) by mouth every evening. 90 tablet 1   No facility-administered medications prior to visit.     ROS Review of Systems  Constitutional: Positive for unexpected weight change (wt gain). Negative for diaphoresis, fatigue and fever.  HENT: Positive for congestion, postnasal drip and rhinorrhea. Negative for nosebleeds, sinus pressure, sinus pain, sore throat, trouble swallowing  and voice change.   Eyes: Negative.  Negative for visual disturbance.  Respiratory: Negative.  Negative for cough, chest tightness, shortness of breath and wheezing.   Cardiovascular: Negative for chest pain, palpitations and leg swelling.  Gastrointestinal: Negative for abdominal pain, constipation, diarrhea, nausea and vomiting.  Endocrine: Negative.   Genitourinary: Negative.  Negative for difficulty urinating, penile swelling, scrotal swelling, testicular pain and urgency.  Musculoskeletal: Negative.  Negative for arthralgias and myalgias.  Skin: Negative for color change and pallor.  Neurological: Negative.  Negative for dizziness, weakness, light-headedness and headaches.  Hematological: Negative for adenopathy. Does not bruise/bleed easily.  Psychiatric/Behavioral: Negative.     Objective:  BP (!) 142/100 (BP Location: Left Arm, Patient Position: Sitting, Cuff Size: Large)    Pulse 92    Temp 97.6 F (36.4 C) (Oral)    Resp 16    Ht 5\' 9"  (1.753 m)    Wt 277 lb (125.6 kg)    SpO2 95%    BMI 40.91 kg/m   BP Readings from Last 3 Encounters:  05/03/19 (!) 142/100  03/26/19 138/88  11/20/18 (!) 148/88    Wt Readings from Last 3 Encounters:  05/03/19 277 lb (125.6 kg)  03/26/19 274 lb (124.3 kg)  11/20/18 258 lb (117 kg)    Physical Exam Vitals signs reviewed.  Constitutional:      Appearance: He is obese. He is not ill-appearing or diaphoretic.  HENT:     Nose: Nose normal.  No congestion or rhinorrhea.     Mouth/Throat:     Mouth: Mucous membranes are moist.  Eyes:     General: No scleral icterus.    Conjunctiva/sclera: Conjunctivae normal.  Neck:     Musculoskeletal: Normal range of motion. No neck rigidity or muscular tenderness.  Cardiovascular:     Rate and Rhythm: Normal rate and regular rhythm.     Heart sounds: No murmur. No gallop.   Pulmonary:     Effort: Pulmonary effort is normal. No respiratory distress.     Breath sounds: No stridor. No wheezing,  rhonchi or rales.  Abdominal:     General: Abdomen is protuberant. Bowel sounds are normal. There is no distension.     Palpations: Abdomen is soft. There is no hepatomegaly, splenomegaly or mass.     Tenderness: There is no abdominal tenderness. There is no guarding.     Hernia: There is no hernia in the left inguinal area or right inguinal area.  Genitourinary:    Pubic Area: No rash.      Penis: Normal. No discharge, swelling or lesions.      Scrotum/Testes: Normal.        Right: Mass, tenderness or swelling not present.        Left: Mass, tenderness or swelling not present.     Epididymis:     Right: Normal. Not inflamed or enlarged. No mass.     Left: Normal. Not inflamed or enlarged. No mass.     Prostate: Enlarged. Not tender and no nodules present.     Rectum: Normal. Guaiac result negative. No mass, tenderness, anal fissure, external hemorrhoid or internal hemorrhoid. Normal anal tone.  Musculoskeletal: Normal range of motion.     Right lower leg: 1+ Edema present.     Left lower leg: 1+ Edema present.  Lymphadenopathy:     Cervical: No cervical adenopathy.     Lower Body: No right inguinal adenopathy. No left inguinal adenopathy.  Skin:    General: Skin is warm and dry.     Coloration: Skin is not pale.     Findings: No lesion.  Neurological:     General: No focal deficit present.     Mental Status: He is alert.  Psychiatric:        Mood and Affect: Mood normal.        Behavior: Behavior normal.     Lab Results  Component Value Date   WBC 8.3 05/03/2019   HGB 13.6 05/03/2019   HCT 40.0 05/03/2019   PLT 243.0 05/03/2019   GLUCOSE 94 05/03/2019   CHOL 198 05/03/2019   TRIG 212.0 (H) 05/03/2019   HDL 45.60 05/03/2019   LDLDIRECT 134.0 05/03/2019   LDLCALC 110 (H) 12/21/2017   ALT 52 05/03/2019   AST 23 05/03/2019   NA 140 05/03/2019   K 3.9 05/03/2019   CL 106 05/03/2019   CREATININE 1.03 05/03/2019   BUN 25 (H) 05/03/2019   CO2 27 05/03/2019   TSH 1.77  05/03/2019   PSA 2.67 12/21/2017   INR 0.95 09/07/2017   HGBA1C 5.6 05/03/2019    Dg Chest 2 View  Result Date: 11/20/2018 CLINICAL DATA:  Cough, congestion and shortness of breath. EXAM: CHEST - 2 VIEW COMPARISON:  Chest x-ray 11/13/2018 FINDINGS: The cardiac silhouette, mediastinal and hilar contours are within normal limits and stable. Stable chronic bronchitic type lung changes, likely related to smoking. No infiltrates, edema or effusions. No worrisome pulmonary lesions. The bony thorax is  intact. Stable advanced degenerative changes involving the left shoulder joint. IMPRESSION: Chronic bronchitic changes but no acute overlying pulmonary process. Electronically Signed   By: Marijo Sanes M.D.   On: 11/20/2018 11:05   Dg Ribs Unilateral Left  Result Date: 11/20/2018 CLINICAL DATA:  Right rib pain no injury EXAM: LEFT RIBS - 2 VIEW COMPARISON:  Chest two-view 11/20/2018 FINDINGS: Negative for left rib fracture or lesion. Severe degenerative change left shoulder joint. IMPRESSION: Negative for left rib fracture Advanced degenerative change left shoulder . Electronically Signed   By: Franchot Gallo M.D.   On: 11/20/2018 11:07    Assessment & Plan:   Samay was seen today for annual exam and hypertension.  Diagnoses and all orders for this visit:  Essential hypertension- His blood pressure is not adequately well controlled.  Will continue the ARB at the current dose.  He will improve his lifestyle modifications.  I have also asked him to start taking a thiazide diuretic since he has edema in his lower extremities.  I have also asked him to stop taking decongestants and to avoid NSAIDs. -     Basic metabolic panel; Future -     TSH; Future -     Hepatic function panel; Future -     indapamide (LOZOL) 1.25 MG tablet; Take 1 tablet (1.25 mg total) by mouth daily.  Hyperglycemia- His blood sugars are normal now. -     Hemoglobin A1c; Future  Pernicious anemia-B12 deficiency- His B12 level  is mildly low.  I have asked him to start high-dose oral B12 supplementation. -     CBC with Differential/Platelet; Future -     Folate; Future -     Vitamin B12; Future -     cyanocobalamin 2000 MCG tablet; Take 1 tablet (2,000 mcg total) by mouth daily.  Vitamin D deficiency -     VITAMIN D 25 Hydroxy (Vit-D Deficiency, Fractures); Future -     Cholecalciferol 50 MCG (2000 UT) TABS; Take 2 tablets (4,000 Units total) by mouth daily.  Routine general medical examination at a health care facility- Exam completed, labs reviewed, vaccines reviewed, colon cancer screening is up-to-date, patient education material was given. -     Lipid panel; Future -     Cancel: PSA; Future  Allergic rhinoconjunctivitis -     levocetirizine (XYZAL) 5 MG tablet; Take 1 tablet (5 mg total) by mouth every evening.  Hyperlipidemia with target LDL less than 100- He does not have an elevated ASCVD risk score so I did not recommend a statin for CV risk reduction.   I have discontinued Zenovia Jarred. Kundrat "Darren"'s diclofenac sodium and cetirizine-pseudoephedrine. I am also having him start on indapamide, cyanocobalamin, and Cholecalciferol. Additionally, I am having him maintain his tamsulosin, telmisartan, montelukast, triamcinolone, and levocetirizine.  Meds ordered this encounter  Medications   levocetirizine (XYZAL) 5 MG tablet    Sig: Take 1 tablet (5 mg total) by mouth every evening.    Dispense:  90 tablet    Refill:  1   indapamide (LOZOL) 1.25 MG tablet    Sig: Take 1 tablet (1.25 mg total) by mouth daily.    Dispense:  90 tablet    Refill:  0   cyanocobalamin 2000 MCG tablet    Sig: Take 1 tablet (2,000 mcg total) by mouth daily.    Dispense:  90 tablet    Refill:  1   Cholecalciferol 50 MCG (2000 UT) TABS    Sig: Take 2  tablets (4,000 Units total) by mouth daily.    Dispense:  180 tablet    Refill:  1     Follow-up: Return in about 3 months (around 08/03/2019).  Scarlette Calico, MD

## 2019-05-04 LAB — VITAMIN B12: Vitamin B-12: 195 pg/mL — ABNORMAL LOW (ref 211–911)

## 2019-05-04 LAB — FOLATE: Folate: 11.7 ng/mL (ref >5.9)

## 2019-05-04 LAB — VITAMIN D 25 HYDROXY (VIT D DEFICIENCY, FRACTURES): VITD: 24.3 ng/mL — ABNORMAL LOW (ref 30.00–100.00)

## 2019-05-05 ENCOUNTER — Encounter: Payer: Self-pay | Admitting: Internal Medicine

## 2019-05-05 MED ORDER — CYANOCOBALAMIN 2000 MCG PO TABS: 2000.0000 ug | ORAL_TABLET | Freq: Every day | ORAL | 1 refills | Status: DC

## 2019-05-05 MED ORDER — CHOLECALCIFEROL 50 MCG (2000 UT) PO TABS: 2.0000 | ORAL_TABLET | Freq: Every day | ORAL | 1 refills | Status: DC

## 2019-05-06 ENCOUNTER — Encounter: Payer: Self-pay | Admitting: Internal Medicine

## 2019-05-15 ENCOUNTER — Ambulatory Visit (INDEPENDENT_AMBULATORY_CARE_PROVIDER_SITE_OTHER): Payer: BC Managed Care – PPO | Admitting: Orthopaedic Surgery

## 2019-05-15 ENCOUNTER — Encounter: Payer: Self-pay | Admitting: Orthopaedic Surgery

## 2019-05-15 VITALS — BP 135/87 | HR 99 | Ht 69.5 in | Wt 270.0 lb

## 2019-05-15 DIAGNOSIS — M19012 Primary osteoarthritis, left shoulder: Secondary | ICD-10-CM | POA: Diagnosis not present

## 2019-05-15 NOTE — Progress Notes (Signed)
Office Visit Note   Patient: Taylor Schroeder           Date of Birth: 1965/12/11           MRN: [id number] Visit Date: 05/15/2019              Requested by: Janith Lima, MD [address] [place],  [place] [address] PCP: Janith Lima, MD   Assessment & Plan: Visit Diagnoses:  1. Primary osteoarthritis of left shoulder     Plan: Like to have patient see Dr. Marlou Sa with his rotator cuff tendinopathy that was present without full-thickness tear and may be that his rotator cuff has progressed and it may be possible that reverse shoulder is preferable to total shoulder arthroplasty.  Dr. Marlou Sa can see him and decide on what surgery would be best for him and proceed.  He does have some cervical spondylosis but his principal problem wakes him up at night and pain with daily activity as well as work is a severe left shoulder osteoarthritis.  Follow-Up Instructions: No follow-ups on file.   Orders:  No orders of the defined types were placed in this encounter.  No orders of the defined types were placed in this encounter.     Procedures: No procedures performed   Clinical Data: No additional findings.   Subjective: Chief Complaint  Patient presents with   Left Shoulder - Pain    HPI 53 year old male returns with progressive left shoulder osteoarthritis with severe deformity of the head large osteophytes.  He has been doing heating and air and we previously had scheduled surgery for her shoulder arthroplasty last year but he had to delay this due to short-term disability restraints.  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  Previous MRI scan showed some fissures in the rotator cuff with some mild muscle atrophy but no full-thickness tear of the rotator cuff.  He states he is now able to proceed with surgical intervention and is requesting to discuss surgery again today.  He has noticed some weakness in his hand  with gripping.  Does have a history of neck pain but CT scan 2015 and MRI scan 2016 showed some foraminal narrowing at C3-4 and C5-6 which was moderate in degree.  He has severe pain when he tries to lift his arm up over his head and pain with rotation.  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  Review of Systems he has had bilateral knee arthroplasties done by me 2015 and 2018 both doing well.  He normally makes rapid postoperative recovery.  Previous gallbladder surgery, obesity BMI now less than 40 previously more than 40 otherwise negative is a pertains HPI.   Objective: Vital Signs: BP 135/87    Pulse 99    Ht 5' 9.5" (1.765 m)    Wt 270 lb (122.5 kg)    BMI 39.30 kg/m   Physical Exam Constitutional:      Appearance: He is well-developed.  HENT:     Head: Normocephalic and atraumatic.  Eyes:     Pupils: Pupils are equal, round, and reactive to light.  Neck:     Thyroid: No thyromegaly.     Trachea: No tracheal deviation.  Cardiovascular:     Rate and Rhythm: Normal rate.  Pulmonary:  Effort: Pulmonary effort is normal.     Breath sounds: No wheezing.  Abdominal:     General: Bowel sounds are normal.     Palpations: Abdomen is soft.  Skin:    General: Skin is warm and dry.     Capillary Refill: Capillary refill takes less than 2 seconds.  Neurological:     Mental Status: He is alert and oriented to person, place, and time.  Psychiatric:        Behavior: Behavior normal.        Thought Content: Thought content normal.        Judgment: Judgment normal.     Ortho Exam patient has some mild brachial plexus tenderness.  Severe pain with shoulder internal and external rotation.  Pain when he uses arm to get up from a chair.  Good knee flexion extension lateral ligaments are stable no knee effusion.  No lower extremity clonus.  He is able to get his hand on the left just the top of his head.   Internal rotation limited to mid axillary to posterior axillary line.  Opposite right shoulder has full range of motion no discomfort.  Specialty Comments:  No specialty comments available.  Imaging: CLINICAL DATA:  Chronic left shoulder pain worse since a fall in December 2018.  EXAM: MRI OF THE LEFT SHOULDER WITHOUT CONTRAST  TECHNIQUE: Multiplanar, multisequence MR imaging of the shoulder was performed. No intravenous contrast was administered.  COMPARISON:  None.  FINDINGS: Rotator cuff: Moderate rotator cuff tendinopathy/tendinosis with interstitial tears mainly along the footprint attachment region of the supraspinatus tendon. No full-thickness retracted rotator cuff tear.  Muscles: Mild diffuse fatty change involving the shoulder musculature.  Biceps long head:  Intact  Acromioclavicular Joint: Mild degenerative changes. Type 1-2 acromion. No lateral downsloping. Undersurface spurring. Os acromial noted.  Glenohumeral Joint: Severe degenerative changes with marked joint space narrowing, full-thickness cartilage loss, subchondral cystic change, osteophytic spurring, slight widening and thinning of the glenoid, moderate-sized joint effusion and loose ossified bodies.  Labrum:  Degenerated and likely torn.  Bones:  No acute bony abnormality.  Other: No subacromial/subdeltoid fluid collections to suggest bursitis.  IMPRESSION: 1. Severe glenohumeral joint degenerative changes. 2. Moderate rotator cuff tendinopathy/tendinosis but no full-thickness retracted tear. 3. Degenerated and likely torn glenoid labrum. 4. Intact long head biceps tendon. 5. Os acromiale.   Electronically Signed   By: Marijo Sanes M.D.   On: 12/06/2017 09:37   PMFS History: Patient Active Problem List   Diagnosis Date Noted   Allergic rhinoconjunctivitis 01/03/2019   Seasonal allergic rhinitis due to pollen 04/11/2018   Vitamin D deficiency 12/21/2017    Primary osteoarthritis of left shoulder 06/13/2017   Sleep apnea 03/08/2017   Hyperlipidemia with target LDL less than 100 12/22/2016   Primary erectile dysfunction 11/18/2014   Essential hypertension 07/15/2014   DDD (degenerative disc disease), cervical 10/03/2013   Esophageal ring, acquired 07/12/2013   Crohn's ileitis - working dx 06/01/2013   BPH (benign prostatic hyperplasia) 04/18/2013   Pernicious anemia-B12 deficiency 04/18/2013   Severe obesity (BMI >= 40) ([facility]) 12/21/2012   GERD (gastroesophageal reflux disease) 12/21/2012   Hyperglycemia 08/29/2012   Routine general medical examination at a health care facility 08/29/2012   Past Medical History:  Diagnosis Date   Arthritis    "qwhere" (02/03/2017)   BPH (benign prostatic hyperplasia)    Childhood asthma    "as a baby"   Coronary artery disease Non-obstructive   a. nonobs cath in 2006;  b. 01/2013 Cath: LM nl, LAD 70m, LCX nl, RCA non-dom, nl. EF 60-65%.   Esophageal ring, acquired 07/12/2013   GERD (gastroesophageal reflux disease)    H/O hiatal hernia    "think so" (02/06/2014)   Headache    "resolved w/BP control in 11/2015" (02/03/2017)   High cholesterol    Hypertension    Ileitis 2014   Suspected Crohn's, think not as time has gone on   Obesity    Pernicious anemia-B12 deficiency 04/18/2013   PONV (postoperative nausea and vomiting) 02/03/2017; 08/2016   Sleep apnea     Family History  Problem Relation Age of Onset   Hypertension Mother    Cancer Father        type unknown   COPD Other    Emphysema Maternal Grandmother    Emphysema Maternal Grandfather    Other Paternal Grandmother        spinal cancer   Alcohol abuse Neg Hx    Diabetes Neg Hx    Early death Neg Hx    Heart disease Neg Hx    Hyperlipidemia Neg Hx    Kidney disease Neg Hx    Stroke Neg Hx    Colon cancer Neg Hx    Stomach cancer Neg Hx    Esophageal cancer Neg Hx    Rectal cancer Neg Hx     Liver cancer Neg Hx     Past Surgical History:  Procedure Laterality Date   APPENDECTOMY     CARDIAC CATHETERIZATION  01/2013   "results were clear"   CARPAL TUNNEL RELEASE Right    CHOLECYSTECTOMY N/A 02/03/2017   Procedure: LAPAROSCOPIC CHOLECYSTECTOMY;  Surgeon: Greer Pickerel, MD;  Location: [facility];  Service: General;  Laterality: N/A;   COLONOSCOPY  ~ 2014   ESOPHAGOGASTRODUODENOSCOPY  ~ 2014   [place]   "? side"   KNEE ARTHROPLASTY Left 02/06/2014   Procedure: COMPUTER ASSISTED TOTAL KNEE ARTHROPLASTY;  Surgeon: [name], MD;  Location: [facility];  Service: Orthopedics;  Laterality: Left;  Cemented Left Total Knee Arthroplasty   KNEE ARTHROSCOPY Left 08/2016   scar tissue cleaned out   LAPAROSCOPIC CHOLECYSTECTOMY  02/03/2017   LEFT HEART CATHETERIZATION WITH CORONARY ANGIOGRAM N/A 01/29/2013   Procedure: LEFT HEART CATHETERIZATION WITH CORONARY ANGIOGRAM;  Surgeon: Thayer Headings, MD;  Location[facility];  Service: Cardiovascular;  Laterality: N/A;   MULTIPLE TOOTH EXTRACTIONS  09/2016   SHOULDER ARTHROSCOPY Left 03/12/2013   TOTAL KNEE ARTHROPLASTY Left 02/06/2014   TOTAL KNEE ARTHROPLASTY Right 09/14/2017   TOTAL KNEE ARTHROPLASTY Right 09/14/2017   Procedure: RIGHT TOTAL KNEE ARTHROPLASTY;  Surgeon: [name], MD;  Location: [facility];  Service: Orthopedics;  Laterality: Right;   Social History   Occupational History   Occupation: MAINTENANCE    Employer: [employer]  Tobacco Use   Smoking status: Former Smoker    Packs/day: 2.00    Years: 30.00    Pack years: 60.00    Types: Cigarettes    Quit date: 08/29/2008    Years since quitting: 10.7   Smokeless tobacco: Never Used  Substance and Sexual Activity   Alcohol use: No    Comment: 02/03/2017 "maybe 12 pack of beer all at one time but only once/year"; 02/06/2014 "couple beers a couple times/month"   Drug use: No   Sexual activity: Yes

## 2019-05-23 ENCOUNTER — Ambulatory Visit (INDEPENDENT_AMBULATORY_CARE_PROVIDER_SITE_OTHER): Payer: BC Managed Care – PPO | Admitting: Orthopedic Surgery

## 2019-05-23 ENCOUNTER — Encounter: Payer: Self-pay | Admitting: Orthopedic Surgery

## 2019-05-23 VITALS — Ht 69.5 in | Wt 270.0 lb

## 2019-05-23 DIAGNOSIS — M25512 Pain in left shoulder: Secondary | ICD-10-CM

## 2019-05-23 DIAGNOSIS — M19012 Primary osteoarthritis, left shoulder: Secondary | ICD-10-CM | POA: Diagnosis not present

## 2019-05-23 DIAGNOSIS — G8929 Other chronic pain: Secondary | ICD-10-CM | POA: Diagnosis not present

## 2019-05-24 ENCOUNTER — Encounter: Payer: Self-pay | Admitting: Orthopedic Surgery

## 2019-05-24 NOTE — Progress Notes (Signed)
Office Visit Note   Patient: Taylor Schroeder           Date of Birth: 08/21/1966           MRN: [id number] Visit Date: 05/23/2019 Requested by: Janith Lima, MD [address],  [place] [address] PCP: Janith Lima, MD  Subjective: Chief Complaint  Patient presents with  . Left Shoulder - Pain    HPI: [name] is a 53 year old patient with left shoulder pain.  He has had pain for many years.  Describes occasional hand numbness but no neck pain.  He works in Estate manager/land agent.  He has had bilateral total knee replacements.  Plans on working about 2-1/2 more years and can avoid heavy lifting.  He describes significantly decreased range of motion as well as daily pain and night pain.              ROS: All systems reviewed are negative as they relate to the chief complaint within the history of present illness.  Patient denies  fevers or chills.   Assessment & Plan: Visit Diagnoses:  1. Primary osteoarthritis of left shoulder   2. Chronic left shoulder pain     Plan: Impression is severe end-stage left shoulder arthritis with B2 to B3 glenoid and posterior subluxation of the humeral head.  He may or may not be a candidate for reverse shoulder replacement.  He has too much deformity for standard shoulder replacement.  May need vault reconstruction system planning from [employer].  Plan on thin cut CT for now with preoperative patient specific instrumentation planning to determine feasibility of replacement.  I will see him back after that study  Follow-Up Instructions: Return for Return after CT scan.   Orders:  Orders Placed This Encounter  Procedures  . CT SHOULDER LEFT WO CONTRAST   No orders of the defined types were placed in this encounter.     Procedures: No procedures performed   Clinical Data: No additional findings.  Objective: Vital Signs: Ht 5' 9.5" (1.765 m)   Wt 270 lb (122.5 kg)   BMI 39.30 kg/m   Physical Exam:   Constitutional:  Patient appears well-developed HEENT:  Head: Normocephalic Eyes:EOM are normal Neck: Normal range of motion Cardiovascular: Normal rate Pulmonary/chest: Effort normal Neurologic: Patient is alert Skin: Skin is warm Psychiatric: Patient has normal mood and affect    Ortho Exam: Ortho exam demonstrates functional deltoid on the left.  External rotation of 15 degrees of abduction is about 15 degrees.  Forward flexion and abduction isolated about 40 degrees bilaterally.  Motor sensory function of the hand is intact.  Neck range of motion is full.  Rotator cuff strength is also good to infraspinatus supraspinatus and subscap testing although his passive range of motion is significantly limited.  Specialty Comments:  No specialty comments available.  Imaging: No results found.   PMFS History: Patient Active Problem List   Diagnosis Date Noted  . Allergic rhinoconjunctivitis 01/03/2019  . Seasonal allergic rhinitis due to pollen 04/11/2018  . Vitamin D deficiency 12/21/2017  . 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 Sleep apnea 03/08/2017  . Hyperlipidemia with target LDL less than 100 12/22/2016  . Primary erectile dysfunction 11/18/2014  . Essential hypertension 07/15/2014  . DDD (degenerative disc disease), cervical 10/03/2013  . Esophageal ring, acquired 07/12/2013  . Crohn's ileitis - working dx 06/01/2013  . BPH (benign prostatic hyperplasia) 04/18/2013  . Pernicious anemia-B12 deficiency 04/18/2013  .  Severe obesity (BMI >= 40) ([facility]) 12/21/2012  . GERD (gastroesophageal reflux disease) 12/21/2012  . Hyperglycemia 08/29/2012  . Routine general medical examination at a health care facility 08/29/2012   Past Medical History:  Diagnosis Date  . Arthritis    "qwhere" (02/03/2017)  . BPH (benign prostatic hyperplasia)   . Childhood asthma    "as a baby"  . Coronary artery disease Non-obstructive   a. nonobs cath in 2006;  b. 01/2013 Cath: LM nl, LAD 14m, LCX  nl, RCA non-dom, nl. EF 60-65%.  . Esophageal ring, acquired 07/12/2013  . GERD (gastroesophageal reflux disease)   . H/O hiatal hernia    "think so" (02/06/2014)  . Headache    "resolved w/BP control in 11/2015" (02/03/2017)  . High cholesterol   . Hypertension   . Ileitis 2014   Suspected Crohn's, think not as time has gone on  . Obesity   . Pernicious anemia-B12 deficiency 04/18/2013  . PONV (postoperative nausea and vomiting) 02/03/2017; 08/2016  . Sleep apnea     Family History  Problem Relation Age of Onset  . Hypertension Mother   . Cancer Father        type unknown  . COPD Other   . Emphysema Maternal Grandmother   . Emphysema Maternal Grandfather   . Other Paternal Grandmother        spinal cancer  . Alcohol abuse Neg Hx   . Diabetes Neg Hx   . Early death Neg Hx   . Heart disease Neg Hx   . Hyperlipidemia Neg Hx   . Kidney disease Neg Hx   . Stroke Neg Hx   . Colon cancer Neg Hx   . Stomach cancer Neg Hx   . Esophageal cancer Neg Hx   . Rectal cancer Neg Hx   . Liver cancer Neg Hx     Past Surgical History:  Procedure Laterality Date  . APPENDECTOMY    . CARDIAC CATHETERIZATION  01/2013   "results were clear"  . CARPAL TUNNEL RELEASE Right   . CHOLECYSTECTOMY N/A 02/03/2017   Procedure: LAPAROSCOPIC CHOLECYSTECTOMY;  Surgeon: Greer Pickerel, MD;  Location: [facility];  Service: General;  Laterality: N/A;  . COLONOSCOPY  ~ 2014  . ESOPHAGOGASTRODUODENOSCOPY  ~ 2014  . INGUINAL HERNIA REPAIR  1966   "? side"  . KNEE ARTHROPLASTY Left 02/06/2014   Procedure: COMPUTER ASSISTED TOTAL KNEE ARTHROPLASTY;  Surgeon: [name], MD;  Location: [facility];  Service: Orthopedics;  Laterality: Left;  Cemented Left Total Knee Arthroplasty  . KNEE ARTHROSCOPY Left 08/2016   scar tissue cleaned out  . LAPAROSCOPIC CHOLECYSTECTOMY  02/03/2017  . LEFT HEART CATHETERIZATION WITH CORONARY ANGIOGRAM N/A 01/29/2013   Procedure: LEFT HEART CATHETERIZATION WITH CORONARY ANGIOGRAM;  Surgeon:  Thayer Headings, MD;  Location: [facility] CATH LAB;  Service: Cardiovascular;  Laterality: N/A;  . MULTIPLE TOOTH EXTRACTIONS  09/2016  . SHOULDER ARTHROSCOPY Left 03/12/2013  . TOTAL KNEE ARTHROPLASTY Left 02/06/2014  . TOTAL KNEE ARTHROPLASTY Right 09/14/2017  . TOTAL KNEE ARTHROPLASTY Right 09/14/2017   Procedure: RIGHT TOTAL KNEE ARTHROPLASTY;  Surgeon: [name], MD;  Location: [facility];  Service: Orthopedics;  Laterality: Right;   Social History   Occupational History  . Occupation: MAINTENANCE    Employer: [employer]  Tobacco Use  . Smoking status: Former Smoker    Packs/day: 2.00    Years: 30.00    Pack years: 60.00    Types: Cigarettes    Quit date:  08/29/2008    Years since quitting: 10.7  . Smokeless tobacco: Never Used  Substance and Sexual Activity  . Alcohol use: No    Comment: 02/03/2017 "maybe 12 pack of beer all at one time but only once/year"; 02/06/2014 "couple beers a couple times/month"  . Drug use: No  . Sexual activity: Yes

## 2019-06-07 ENCOUNTER — Other Ambulatory Visit: Payer: Self-pay | Admitting: Internal Medicine

## 2019-06-07 DIAGNOSIS — I10 Essential (primary) hypertension: Secondary | ICD-10-CM

## 2019-06-11 ENCOUNTER — Telehealth: Payer: Self-pay | Admitting: Orthopedic Surgery

## 2019-06-11 ENCOUNTER — Ambulatory Visit
Admission: RE | Admit: 2019-06-11 | Discharge: 2019-06-11 | Disposition: A | Discharge status: Home / Self Care | Payer: BC Managed Care – PPO | Source: Ambulatory Visit | Attending: Orthopedic Surgery | Admitting: Orthopedic Surgery

## 2019-06-11 DIAGNOSIS — M25512 Pain in left shoulder: Secondary | ICD-10-CM

## 2019-06-11 DIAGNOSIS — G8929 Other chronic pain: Secondary | ICD-10-CM

## 2019-06-11 NOTE — Telephone Encounter (Signed)
Called patient left message to return call to schedule a CT scan review with Dr Marlou Sa

## 2019-06-20 ENCOUNTER — Encounter: Payer: Self-pay | Admitting: Orthopedic Surgery

## 2019-06-20 ENCOUNTER — Ambulatory Visit (INDEPENDENT_AMBULATORY_CARE_PROVIDER_SITE_OTHER): Payer: BC Managed Care – PPO | Admitting: Orthopedic Surgery

## 2019-06-20 DIAGNOSIS — M19012 Primary osteoarthritis, left shoulder: Secondary | ICD-10-CM | POA: Diagnosis not present

## 2019-06-24 ENCOUNTER — Encounter: Payer: Self-pay | Admitting: Orthopedic Surgery

## 2019-06-24 NOTE — Progress Notes (Signed)
Office Visit Note   Patient: Taylor Schroeder           Date of Birth: Dec 27, 1965           MRN: [id number] Visit Date: 06/20/2019 Requested by: Janith Lima, MD [address],  [place] [address] PCP: Janith Lima, MD  Subjective: Chief Complaint  Patient presents with  . Left Shoulder - Follow-up    HPI: Taylor Schroeder is a patient with left shoulder arthritis.  Here to review CT scan.  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  He is reporting some dorsal hand numbness which is positional with the shoulder as well as increasing pain in the left shoulder.  He is had left shoulder arthritis for many years.  The pain is becoming somewhat debilitating for him.              ROS: All systems reviewed are negative as they relate to the chief complaint within the history of present illness.  Patient denies  fevers or chills.   Assessment & Plan: Visit Diagnoses:  1. Primary osteoarthritis of left shoulder     Plan: Impression is end-stage left shoulder arthritis with a B2 B3 type glenoid.  I think he may be able to have glenoid baseplate augmented placed without too much loss of version.  However I want to send this scan in for preoperative planning for either vault reconstruction planning versus augmented baseplate reverse shoulder replacement.  I think we should be able to get that done for Darren but this is not the type of shoulder that he wants to be vigorously active with.  He is only 53 years old and for this to last him as long as we hope it will last he needs to be mindful of how much demand he puts on the shoulder.  Once we get the plan done I can call him and let him know our surgical timing.  For now based on his significant pain and new numbness which is positional with the shoulder I think it would be optimal to keep him out of work for the next 3 months.  Follow-Up Instructions: No follow-ups on file.   Orders:  No orders of the defined types were  placed in this encounter.  No orders of the defined types were placed in this encounter.     Procedures: No procedures performed   Clinical Data: No additional findings.  Objective: Vital Signs: There were no vitals taken for this visit.  Physical Exam:   Constitutional: Patient appears well-developed HEENT:  Head: Normocephalic Eyes:EOM are normal Neck: Normal range of motion Cardiovascular: Normal rate Pulmonary/chest: Effort normal Neurologic: Patient is alert Skin: Skin is warm Psychiatric: Patient has normal mood and affect    Ortho Exam: Ortho exam demonstrates external rotation of only about 10 degrees.  He does have a little bit of weakness with infraspinatus testing today.  Active forward flexion and abduction both well below 90 degrees.  He does have intact EPL FPL interosseous function as well as good axillary deltoid function.  Biceps triceps strength also is good with no wasting.  Specialty Comments:  No specialty comments available.  Imaging: No results found.   PMFS History: Patient Active Problem List   Diagnosis Date Noted  . Allergic rhinoconjunctivitis 01/03/2019  . Seasonal allergic rhinitis due to pollen 04/11/2018  . Vitamin D deficiency 12/21/2017  . 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 Sleep  apnea 03/08/2017  . Hyperlipidemia with target LDL less than 100 12/22/2016  . Primary erectile dysfunction 11/18/2014  . Essential hypertension 07/15/2014  . DDD (degenerative disc disease), cervical 10/03/2013  . Esophageal ring, acquired 07/12/2013  . Crohn's ileitis - working dx 06/01/2013  . BPH (benign prostatic hyperplasia) 04/18/2013  . Pernicious anemia-B12 deficiency 04/18/2013  . Severe obesity (BMI >= 40) ([facility]) 12/21/2012  . GERD (gastroesophageal reflux disease) 12/21/2012  . Hyperglycemia 08/29/2012  . Routine general medical examination at a health care facility 08/29/2012   Past Medical History:  Diagnosis Date   . Arthritis    "qwhere" (02/03/2017)  . BPH (benign prostatic hyperplasia)   . Childhood asthma    "as a baby"  . Coronary artery disease Non-obstructive   a. nonobs cath in 2006;  b. 01/2013 Cath: LM nl, LAD 49m, LCX nl, RCA non-dom, nl. EF 60-65%.  . Esophageal ring, acquired 07/12/2013  . GERD (gastroesophageal reflux disease)   . H/O hiatal hernia    "think so" (02/06/2014)  . Headache    "resolved w/BP control in 11/2015" (02/03/2017)  . High cholesterol   . Hypertension   . Ileitis 2014   Suspected Crohn's, think not as time has gone on  . Obesity   . Pernicious anemia-B12 deficiency 04/18/2013  . PONV (postoperative nausea and vomiting) 02/03/2017; 08/2016  . Sleep apnea     Family History  Problem Relation Age of Onset  . Hypertension Mother   . Cancer Father        type unknown  . COPD Other   . Emphysema Maternal Grandmother   . Emphysema Maternal Grandfather   . Other Paternal Grandmother        spinal cancer  . Alcohol abuse Neg Hx   . Diabetes Neg Hx   . Early death Neg Hx   . Heart disease Neg Hx   . Hyperlipidemia Neg Hx   . Kidney disease Neg Hx   . Stroke Neg Hx   . Colon cancer Neg Hx   . Stomach cancer Neg Hx   . Esophageal cancer Neg Hx   . Rectal cancer Neg Hx   . Liver cancer Neg Hx     Past Surgical History:  Procedure Laterality Date  . APPENDECTOMY    . CARDIAC CATHETERIZATION  01/2013   "results were clear"  . CARPAL TUNNEL RELEASE Right   . CHOLECYSTECTOMY N/A 02/03/2017   Procedure: LAPAROSCOPIC CHOLECYSTECTOMY;  Surgeon: Greer Pickerel, MD;  Location: [facility];  Service: General;  Laterality: N/A;  . COLONOSCOPY  ~ 2014  . ESOPHAGOGASTRODUODENOSCOPY  ~ 2014  . INGUINAL HERNIA REPAIR  1966   "? side"  . KNEE ARTHROPLASTY Left 02/06/2014   Procedure: COMPUTER ASSISTED TOTAL KNEE ARTHROPLASTY;  Surgeon: [name], MD;  Location: [facility];  Service: Orthopedics;  Laterality: Left;  Cemented Left Total Knee Arthroplasty  . KNEE ARTHROSCOPY Left  08/2016   scar tissue cleaned out  . LAPAROSCOPIC CHOLECYSTECTOMY  02/03/2017  . LEFT HEART CATHETERIZATION WITH CORONARY ANGIOGRAM N/A 01/29/2013   Procedure: LEFT HEART CATHETERIZATION WITH CORONARY ANGIOGRAM;  Surgeon: Thayer Headings, MD;  Location: [facility] CATH LAB;  Service: Cardiovascular;  Laterality: N/A;  . MULTIPLE TOOTH EXTRACTIONS  09/2016  . SHOULDER ARTHROSCOPY Left 03/12/2013  . TOTAL KNEE ARTHROPLASTY Left 02/06/2014  . TOTAL KNEE ARTHROPLASTY Right 09/14/2017  . TOTAL KNEE ARTHROPLASTY Right 09/14/2017   Procedure: RIGHT TOTAL KNEE ARTHROPLASTY;  Surgeon: [name], MD;  Location: [facility];  Service: Orthopedics;  Laterality: Right;   Social History   Occupational History  . Occupation: MAINTENANCE    Employer: [employer]  Tobacco Use  . Smoking status: Former Smoker    Packs/day: 2.00    Years: 30.00    Pack years: 60.00    Types: Cigarettes    Quit date: 08/29/2008    Years since quitting: 10.8  . Smokeless tobacco: Never Used  Substance and Sexual Activity  . Alcohol use: No    Comment: 02/03/2017 "maybe 12 pack of beer all at one time but only once/year"; 02/06/2014 "couple beers a couple times/month"  . Drug use: No  . Sexual activity: Yes

## 2019-06-26 ENCOUNTER — Other Ambulatory Visit: Payer: Self-pay

## 2019-07-20 ENCOUNTER — Encounter (HOSPITAL_COMMUNITY): Payer: Self-pay

## 2019-07-20 NOTE — Pre-Procedure Instructions (Signed)
[id number] - [place] [place] [address] AT [address] [place] [address] Phone: [phone] Fax: [place] [address], [place] [address] [place] [address] Phone: [phone] Fax: [phone]      Your procedure is scheduled on  07-31-19  Report to [facility] Main Entrance "A" at 0530 A.M., and check in at the Admitting office.  Call this number if you have problems the morning of surgery:  [phone]  Call [phone] if you have any questions prior to your surgery date Monday-Friday 8am-4pm    Remember:  Do not eat after midnight the night before your surgery  You may drink clear liquids until 0430AM the morning of your surgery.   Clear liquids allowed are: Water, Non-Citrus Juices (without pulp), Carbonated Beverages, Clear Tea, Black Coffee Only, and Gatorade  Please complete your PRE-SURGERY ENSURE that was provided to you by 0430AM the morning of surgery.  Please, if able, drink it in one setting. DO NOT SIP.   Take these medicines the morning of surgery with A SIP OF WATER : cetirizine (ZYRTEC) telmisartan (MICARDIS)  mometasone-formoterol (DULERA)as needed  7 days prior to surgery STOP taking any Aspirin (unless otherwise instructed by your surgeon), Aleve, Naproxen, Ibuprofen, Motrin, Advil, Goody's, BC's, all herbal medications, fish oil, and all vitamins.   The Morning of Surgery  Do not wear jewelry, make-up or nail polish.  Do not wear lotions, powders, or perfumes/colognes, or deodorant  Do not shave 48 hours prior to surgery.  .  Do not bring valuables to the hospital.  [facility] is not responsible for any belongings or valuables.  If you are a smoker, DO NOT Smoke 24 hours prior to surgery IF you wear a CPAP at night please bring your mask, tubing, and machine the morning of surgery   Remember that you must have  someone to transport you home after your surgery, and remain with you for 24 hours if you are discharged the same day.   Contacts, glasses, hearing aids, dentures or bridgework may not be worn into surgery.    Leave your suitcase in the car.  After surgery it may be brought to your room.  For patients admitted to the hospital, discharge time will be determined by your treatment team.  Patients discharged the day of surgery will not be allowed to drive home.    Special instructions:   [place]- Preparing For Surgery  Before surgery, you can play an important role. Because skin is not sterile, your skin needs to be as free of germs as possible. You can reduce the number of germs on your skin by washing with CHG (chlorahexidine gluconate) Soap before surgery.  CHG is an antiseptic cleaner which kills germs and bonds with the skin to continue killing germs even after washing.    Oral Hygiene is also important to reduce your risk of infection.  Remember - BRUSH YOUR TEETH THE MORNING OF SURGERY WITH YOUR REGULAR TOOTHPASTE  Please do not use if you have an allergy to CHG or antibacterial soaps. If your skin becomes reddened/irritated stop using the CHG.  Do not shave (including legs and underarms) for at least 48 hours prior to first CHG shower. It is OK to shave your face.  Please follow these instructions carefully.   1. Shower the NIGHT BEFORE SURGERY and the MORNING OF SURGERY with CHG Soap.  2. If you chose to wash your hair, wash your hair first as usual with your normal shampoo.  3. After you shampoo, rinse your hair and body thoroughly to remove the shampoo.  4. Use CHG as you would any other liquid soap. You can apply CHG directly to the skin and wash gently with a scrungie or a clean washcloth.   5. Apply the CHG Soap to your body ONLY FROM THE NECK DOWN.  Do not use on open wounds or open sores. Avoid contact with your eyes, ears, mouth and genitals (private parts). Wash  Face and genitals (private parts)  with your normal soap.   6. Wash thoroughly, paying special attention to the area where your surgery will be performed.  7. Thoroughly rinse your body with warm water from the neck down.  8. DO NOT shower/wash with your normal soap after using and rinsing off the CHG Soap.  9. Pat yourself dry with a CLEAN TOWEL.  10. Wear CLEAN PAJAMAS to bed the night before surgery, wear comfortable clothes the morning of surgery  11. Place CLEAN SHEETS on your bed the night of your first shower and DO NOT SLEEP WITH PETS.  Day of Surgery:  Do not apply any deodorants/lotions. Please shower the morning of surgery with the CHG soap  Please wear clean clothes to the hospital/surgery center.   Remember to brush your teeth WITH YOUR REGULAR TOOTHPASTE.   Please read over the  fact sheets that you were given.

## 2019-07-23 ENCOUNTER — Encounter (HOSPITAL_COMMUNITY): Payer: Self-pay

## 2019-07-23 ENCOUNTER — Encounter (HOSPITAL_COMMUNITY)
Admission: RE | Admit: 2019-07-23 | Discharge: 2019-07-23 | Disposition: A | Discharge status: Home / Self Care | Payer: BC Managed Care – PPO | Source: Ambulatory Visit | Attending: Orthopedic Surgery | Admitting: Orthopedic Surgery

## 2019-07-23 ENCOUNTER — Other Ambulatory Visit: Payer: Self-pay

## 2019-07-23 DIAGNOSIS — I10 Essential (primary) hypertension: Secondary | ICD-10-CM | POA: Diagnosis not present

## 2019-07-23 DIAGNOSIS — Z01818 Encounter for other preprocedural examination: Secondary | ICD-10-CM | POA: Insufficient documentation

## 2019-07-23 HISTORY — DX: Personal history of urinary calculi: Z87.442

## 2019-07-23 LAB — BASIC METABOLIC PANEL
Anion gap: 8 (ref 5–15)
BUN: 20 mg/dL (ref 6–20)
CO2: 26 mmol/L (ref 22–32)
Calcium: 9.2 mg/dL (ref 8.9–10.3)
Chloride: 106 mmol/L (ref 98–111)
Creatinine, Ser: 0.94 mg/dL (ref 0.61–1.24)
GFR calc Af Amer: >60 mL/min (ref >60)
GFR calc non Af Amer: >60 mL/min (ref >60)
Glucose, Bld: 104 mg/dL — ABNORMAL HIGH (ref 70–99)
Potassium: 4.2 mmol/L (ref 3.5–5.1)
Sodium: 140 mmol/L (ref 135–145)

## 2019-07-23 LAB — CBC
HCT: 42.7 % (ref 39.0–52.0)
Hemoglobin: 14.4 g/dL (ref 13.0–17.0)
MCH: 30.9 pg (ref 26.0–34.0)
MCHC: 33.7 g/dL (ref 30.0–36.0)
MCV: 91.6 fL (ref 80.0–100.0)
Platelets: 238 10*3/uL (ref 150–400)
RBC: 4.66 MIL/uL (ref 4.22–5.81)
RDW: 12.8 % (ref 11.5–15.5)
WBC: 6.7 10*3/uL (ref 4.0–10.5)
nRBC: 0 % (ref 0.0–0.2)

## 2019-07-23 LAB — URINALYSIS, ROUTINE W REFLEX MICROSCOPIC
Bilirubin Urine: NEGATIVE
Glucose, UA: NEGATIVE mg/dL
Hgb urine dipstick: NEGATIVE
Ketones, ur: NEGATIVE mg/dL
Leukocytes,Ua: NEGATIVE
Nitrite: NEGATIVE
Protein, ur: NEGATIVE mg/dL
Specific Gravity, Urine: 1.025 (ref 1.005–1.030)
pH: 6 (ref 5.0–8.0)

## 2019-07-23 LAB — SURGICAL PCR SCREEN
MRSA, PCR: NEGATIVE
Staphylococcus aureus: POSITIVE — AB

## 2019-07-23 NOTE — Progress Notes (Signed)
[facility]up[facility] [address] for positive PCR of Staph. Pt notified and voiced understanding

## 2019-07-23 NOTE — Pre-Procedure Instructions (Signed)
Taylor Schroeder  07/23/2019    Your procedure is scheduled on Tuesday, July 31, 2019 at 7:15 AM.   Report to [facility] Entrance "A" Admitting Office at 5:30 AM.   Call this number if you have problems the morning of surgery: [phone]   Questions prior to day of surgery, please call [phone] between 8 & 4 PM.   Remember:  Do not eat food after midnight.  You may drink clear liquids until 4:30 AM.  Clear liquids allowed are:            Water, Juice (non-citric and without pulp), Carbonated beverages, Clear Tea, Black Coffee only, Plain Jell-O only, Gatorade and Plain Popsicles only   Drink Pre-surgery Ensure by 4:30 AM day of surgery.    Take these medicines the morning of surgery with A SIP OF WATER: Cetirizine (Zyrtec), Dulera inhaler  Stop NSAIDS (Ibuprofen, Aleve, etc) 7 days prior to surgery. Do not use Aspirin products (BC Powders, Goody's, etc), Multivitamins, Herbal medications or Fish Oil 7 days prior to surgery.    Do not wear jewelry.  Do not wear lotions, powders, cologne or deodorant.  Men may shave face and neck.  Do not bring valuables to the hospital.  [facility] is not responsible for any belongings or valuables.  Contacts, dentures or bridgework may not be worn into surgery.  Leave your suitcase in the car.  After surgery it may be brought to your room.  For patients admitted to the hospital, discharge time will be determined by your treatment team.  Patients discharged the day of surgery will not be allowed to drive home.   [place] - Preparing for Surgery  Before surgery, you can play an important role.  Because skin is not sterile, your skin needs to be as free of germs as possible.  You can reduce the number of germs on you skin by washing with CHG (chlorahexidine gluconate) soap before surgery.  CHG is an antiseptic cleaner which kills germs and bonds with the skin to continue killing germs even after washing.  Oral Hygiene is also  important in reducing the risk of infection.  Remember to brush your teeth with your regular toothpaste the morning of surgery.  Please DO NOT use if you have an allergy to CHG or antibacterial soaps.  If your skin becomes reddened/irritated stop using the CHG and inform your nurse when you arrive at Short Stay.  Do not shave (including legs and underarms) for at least 48 hours prior to the first CHG shower.  You may shave your face.  Please follow these instructions carefully:   1.  Shower with CHG Soap the night before surgery and the morning of Surgery.  2.  If you choose to wash your hair, wash your hair first as usual with your normal shampoo.  3.  After you shampoo, rinse your hair and body thoroughly to remove the shampoo. 4.  Use CHG as you would any other liquid soap.  You can apply chg directly to the skin and wash gently with a      scrungie or washcloth.           5.  Apply the CHG Soap to your body ONLY FROM THE NECK DOWN.   Do not use on open wounds or open sores. Avoid contact with your eyes, ears, mouth and genitals (private parts).  Wash genitals (private parts) with your normal soap - do this prior to using CHG soap.  6.  Wash thoroughly, paying special attention to the area where your surgery will be performed.  7.  Thoroughly rinse your body with warm water from the neck down.  8.  DO NOT shower/wash with your normal soap after using and rinsing off the CHG Soap.  9.  Pat yourself dry with a clean towel.            10.  Wear clean pajamas.            11.  Place clean sheets on your bed the night of your first shower and do not sleep with pets.  Day of Surgery  Shower as above. Do not apply any lotions/deodorants the morning of surgery.   Please wear clean clothes to the hospital. Remember to brush your teeth with toothpaste.   Please read over the fact sheets that you were given.

## 2019-07-23 NOTE — Progress Notes (Signed)
PCP - Dr. Scarlette Calico Cardiologist - N/A  PPM/ICD - N/A Device Orders -  Rep Notified -   Chest x-ray - 11/20/18 EKG - today Stress Test - N/A ECHO - N/A Cardiac Cath - 2014 (clear arteries)  Sleep Study - 04/28/17 (in [place]) CPAP - yes  Fasting Blood Sugar - N/A Checks Blood Sugar _____ times a day  Blood Thinner Instructions: N/A Aspirin Instructions: N/A  ERAS Protcol - yes PRE-SURGERY Ensure - yes  COVID TEST- Scheduled 07/27/19  Pt given quarantine instructions and voiced understanding.  Visitation policy reviewed and pt voiced understanding.   Anesthesia review: No  Patient denies shortness of breath, fever, cough and chest pain at PAT appointment   All instructions explained to the patient, with a verbal understanding of the material. Patient agrees to go over the instructions while at home for a better understanding. Patient also instructed to self quarantine after being tested for COVID-19. The opportunity to ask questions was provided.

## 2019-07-24 LAB — URINE CULTURE: Culture: NO GROWTH

## 2019-07-27 ENCOUNTER — Other Ambulatory Visit (HOSPITAL_COMMUNITY)
Admission: RE | Admit: 2019-07-27 | Discharge: 2019-07-27 | Disposition: A | Discharge status: Home / Self Care | Payer: BC Managed Care – PPO | Source: Ambulatory Visit | Attending: Orthopedic Surgery | Admitting: Orthopedic Surgery

## 2019-07-27 DIAGNOSIS — Z20828 Contact with and (suspected) exposure to other viral communicable diseases: Secondary | ICD-10-CM | POA: Insufficient documentation

## 2019-07-27 DIAGNOSIS — Z01812 Encounter for preprocedural laboratory examination: Secondary | ICD-10-CM | POA: Insufficient documentation

## 2019-07-28 LAB — NOVEL CORONAVIRUS, NAA (HOSP ORDER, SEND-OUT TO REF LAB; TAT 18-24 HRS): SARS-CoV-2, NAA: NOT DETECTED

## 2019-07-29 ENCOUNTER — Other Ambulatory Visit: Payer: Self-pay | Admitting: Internal Medicine

## 2019-07-29 DIAGNOSIS — I10 Essential (primary) hypertension: Secondary | ICD-10-CM

## 2019-07-31 ENCOUNTER — Other Ambulatory Visit: Payer: Self-pay

## 2019-07-31 ENCOUNTER — Inpatient Hospital Stay (HOSPITAL_COMMUNITY): Payer: BC Managed Care – PPO

## 2019-07-31 ENCOUNTER — Encounter (HOSPITAL_COMMUNITY)
Admission: RE | Disposition: A | Discharge status: Home / Self Care | Payer: Self-pay | Source: Home / Self Care | Attending: Orthopedic Surgery

## 2019-07-31 ENCOUNTER — Ambulatory Visit (HOSPITAL_COMMUNITY): Payer: BC Managed Care – PPO | Admitting: Anesthesiology

## 2019-07-31 ENCOUNTER — Observation Stay (HOSPITAL_COMMUNITY)
Admission: RE | Admit: 2019-07-31 | Discharge: 2019-08-01 | Disposition: A | Discharge status: Home / Self Care | Payer: BC Managed Care – PPO | Attending: Orthopedic Surgery | Admitting: Orthopedic Surgery

## 2019-07-31 ENCOUNTER — Encounter (HOSPITAL_COMMUNITY): Payer: Self-pay

## 2019-07-31 DIAGNOSIS — I10 Essential (primary) hypertension: Secondary | ICD-10-CM | POA: Diagnosis not present

## 2019-07-31 DIAGNOSIS — N400 Enlarged prostate without lower urinary tract symptoms: Secondary | ICD-10-CM | POA: Diagnosis not present

## 2019-07-31 DIAGNOSIS — Z888 Allergy status to other drugs, medicaments and biological substances status: Secondary | ICD-10-CM | POA: Diagnosis not present

## 2019-07-31 DIAGNOSIS — Z79899 Other long term (current) drug therapy: Secondary | ICD-10-CM | POA: Insufficient documentation

## 2019-07-31 DIAGNOSIS — K219 Gastro-esophageal reflux disease without esophagitis: Secondary | ICD-10-CM | POA: Diagnosis not present

## 2019-07-31 DIAGNOSIS — Z7951 Long term (current) use of inhaled steroids: Secondary | ICD-10-CM | POA: Insufficient documentation

## 2019-07-31 DIAGNOSIS — Z8249 Family history of ischemic heart disease and other diseases of the circulatory system: Secondary | ICD-10-CM | POA: Diagnosis not present

## 2019-07-31 DIAGNOSIS — M19019 Primary osteoarthritis, unspecified shoulder: Secondary | ICD-10-CM | POA: Diagnosis present

## 2019-07-31 DIAGNOSIS — I2510 Atherosclerotic heart disease of native coronary artery without angina pectoris: Secondary | ICD-10-CM | POA: Insufficient documentation

## 2019-07-31 DIAGNOSIS — Z87891 Personal history of nicotine dependence: Secondary | ICD-10-CM | POA: Insufficient documentation

## 2019-07-31 DIAGNOSIS — Z6836 Body mass index (BMI) 36.0-36.9, adult: Secondary | ICD-10-CM | POA: Insufficient documentation

## 2019-07-31 DIAGNOSIS — M21822 Other specified acquired deformities of left upper arm: Secondary | ICD-10-CM | POA: Insufficient documentation

## 2019-07-31 DIAGNOSIS — E785 Hyperlipidemia, unspecified: Secondary | ICD-10-CM | POA: Insufficient documentation

## 2019-07-31 DIAGNOSIS — G4730 Sleep apnea, unspecified: Secondary | ICD-10-CM | POA: Insufficient documentation

## 2019-07-31 DIAGNOSIS — Z885 Allergy status to narcotic agent status: Secondary | ICD-10-CM | POA: Insufficient documentation

## 2019-07-31 DIAGNOSIS — M19012 Primary osteoarthritis, left shoulder: Secondary | ICD-10-CM | POA: Diagnosis present

## 2019-07-31 DIAGNOSIS — Z9889 Other specified postprocedural states: Secondary | ICD-10-CM

## 2019-07-31 DIAGNOSIS — Z96653 Presence of artificial knee joint, bilateral: Secondary | ICD-10-CM | POA: Diagnosis not present

## 2019-07-31 DIAGNOSIS — Z791 Long term (current) use of non-steroidal anti-inflammatories (NSAID): Secondary | ICD-10-CM | POA: Insufficient documentation

## 2019-07-31 DIAGNOSIS — E7800 Pure hypercholesterolemia, unspecified: Secondary | ICD-10-CM | POA: Insufficient documentation

## 2019-07-31 HISTORY — PX: REVERSE SHOULDER ARTHROPLASTY: SHX5054

## 2019-07-31 SURGERY — ARTHROPLASTY, SHOULDER, TOTAL, REVERSE
Anesthesia: Regional | Site: Shoulder | Laterality: Left

## 2019-07-31 MED ORDER — FENTANYL CITRATE (PF) 250 MCG/5ML IJ SOLN
INTRAMUSCULAR | Status: AC
  Filled 2019-07-31: qty 5

## 2019-07-31 MED ORDER — CEFAZOLIN SODIUM-DEXTROSE 2-4 GM/100ML-% IV SOLN
2.0000 g | Freq: Four times a day (QID) | INTRAVENOUS | Status: AC
  Administered 2019-07-31 – 2019-08-01 (×3): 2 g via INTRAVENOUS
  Filled 2019-07-31 (×3): qty 100

## 2019-07-31 MED ORDER — IRRISEPT - 450ML BOTTLE WITH 0.05% CHG IN STERILE WATER, USP 99.95% OPTIME
TOPICAL | Status: DC | PRN
  Administered 2019-07-31: 450 mL via TOPICAL

## 2019-07-31 MED ORDER — ACETAMINOPHEN 325 MG PO TABS: 325.0000 mg | ORAL_TABLET | Freq: Four times a day (QID) | ORAL | Status: DC | PRN

## 2019-07-31 MED ORDER — MIDAZOLAM HCL 2 MG/2ML IJ SOLN
INTRAMUSCULAR | Status: AC
  Filled 2019-07-31: qty 2

## 2019-07-31 MED ORDER — ONDANSETRON HCL 4 MG PO TABS: 4.0000 mg | ORAL_TABLET | Freq: Four times a day (QID) | ORAL | Status: DC | PRN

## 2019-07-31 MED ORDER — VANCOMYCIN HCL 1000 MG IV SOLR
INTRAVENOUS | Status: AC
  Filled 2019-07-31: qty 1000

## 2019-07-31 MED ORDER — DEXAMETHASONE SODIUM PHOSPHATE 10 MG/ML IJ SOLN
INTRAMUSCULAR | Status: AC
  Filled 2019-07-31: qty 1

## 2019-07-31 MED ORDER — TRANEXAMIC ACID-NACL 1000-0.7 MG/100ML-% IV SOLN
INTRAVENOUS | Status: AC
  Filled 2019-07-31: qty 100

## 2019-07-31 MED ORDER — ASPIRIN 81 MG PO CHEW
81.0000 mg | CHEWABLE_TABLET | Freq: Every day | ORAL | Status: DC
  Administered 2019-08-01: 81 mg via ORAL
  Filled 2019-07-31: qty 1

## 2019-07-31 MED ORDER — ACETAMINOPHEN 10 MG/ML IV SOLN
INTRAVENOUS | Status: DC | PRN
  Administered 2019-07-31: 1000 mg via INTRAVENOUS

## 2019-07-31 MED ORDER — OXYCODONE HCL 5 MG PO TABS
5.0000 mg | ORAL_TABLET | ORAL | Status: DC | PRN
  Administered 2019-07-31 – 2019-08-01 (×3): 10 mg via ORAL
  Filled 2019-07-31 (×3): qty 2

## 2019-07-31 MED ORDER — ALBUMIN HUMAN 5 % IV SOLN
INTRAVENOUS | Status: DC | PRN
  Administered 2019-07-31: 09:00:00 via INTRAVENOUS

## 2019-07-31 MED ORDER — ACETAMINOPHEN 10 MG/ML IV SOLN
INTRAVENOUS | Status: AC
  Filled 2019-07-31: qty 100

## 2019-07-31 MED ORDER — PHENYLEPHRINE HCL-NACL 10-0.9 MG/250ML-% IV SOLN
INTRAVENOUS | Status: DC | PRN
  Administered 2019-07-31: 10:00:00 via INTRAVENOUS
  Administered 2019-07-31: 25 ug/min via INTRAVENOUS

## 2019-07-31 MED ORDER — PHENYLEPHRINE 40 MCG/ML (10ML) SYRINGE FOR IV PUSH (FOR BLOOD PRESSURE SUPPORT)
PREFILLED_SYRINGE | INTRAVENOUS | Status: AC
  Filled 2019-07-31: qty 10

## 2019-07-31 MED ORDER — ROCURONIUM BROMIDE 10 MG/ML (PF) SYRINGE
PREFILLED_SYRINGE | INTRAVENOUS | Status: DC | PRN
  Administered 2019-07-31: 60 mg via INTRAVENOUS

## 2019-07-31 MED ORDER — HYDROMORPHONE HCL 1 MG/ML IJ SOLN: 0.5000 mg | INTRAMUSCULAR | Status: DC | PRN

## 2019-07-31 MED ORDER — METHOCARBAMOL 1000 MG/10ML IJ SOLN
500.0000 mg | Freq: Four times a day (QID) | INTRAVENOUS | Status: DC | PRN
  Filled 2019-07-31: qty 5

## 2019-07-31 MED ORDER — CEFAZOLIN SODIUM-DEXTROSE 2-4 GM/100ML-% IV SOLN
2.0000 g | INTRAVENOUS | Status: AC
  Administered 2019-07-31 (×2): 2 g via INTRAVENOUS

## 2019-07-31 MED ORDER — PROPOFOL 10 MG/ML IV BOLUS
INTRAVENOUS | Status: AC
  Filled 2019-07-31: qty 40

## 2019-07-31 MED ORDER — DEXAMETHASONE SODIUM PHOSPHATE 10 MG/ML IJ SOLN
INTRAMUSCULAR | Status: DC | PRN
  Administered 2019-07-31: 10 mg via INTRAVENOUS

## 2019-07-31 MED ORDER — CHLORHEXIDINE GLUCONATE 4 % EX LIQD: 60.0000 mL | Freq: Once | CUTANEOUS | Status: DC

## 2019-07-31 MED ORDER — METOCLOPRAMIDE HCL 5 MG PO TABS: 5.0000 mg | ORAL_TABLET | Freq: Three times a day (TID) | ORAL | Status: DC | PRN

## 2019-07-31 MED ORDER — SUCCINYLCHOLINE CHLORIDE 200 MG/10ML IV SOSY
PREFILLED_SYRINGE | INTRAVENOUS | Status: DC | PRN
  Administered 2019-07-31: 120 mg via INTRAVENOUS

## 2019-07-31 MED ORDER — ONDANSETRON HCL 4 MG/2ML IJ SOLN
INTRAMUSCULAR | Status: DC | PRN
  Administered 2019-07-31: 4 mg via INTRAVENOUS

## 2019-07-31 MED ORDER — METHOCARBAMOL 500 MG PO TABS
500.0000 mg | ORAL_TABLET | Freq: Four times a day (QID) | ORAL | Status: DC | PRN
  Administered 2019-07-31 – 2019-08-01 (×2): 500 mg via ORAL
  Filled 2019-07-31 (×2): qty 1

## 2019-07-31 MED ORDER — ONDANSETRON HCL 4 MG/2ML IJ SOLN
INTRAMUSCULAR | Status: AC
  Filled 2019-07-31: qty 2

## 2019-07-31 MED ORDER — PHENYLEPHRINE HCL (PRESSORS) 10 MG/ML IV SOLN
INTRAVENOUS | Status: AC
  Filled 2019-07-31: qty 1

## 2019-07-31 MED ORDER — TRANEXAMIC ACID-NACL 1000-0.7 MG/100ML-% IV SOLN
1000.0000 mg | INTRAVENOUS | Status: AC
  Administered 2019-07-31: 08:00:00 1000 mg via INTRAVENOUS

## 2019-07-31 MED ORDER — PHENOL 1.4 % MT LIQD: 1.0000 | OROMUCOSAL | Status: DC | PRN

## 2019-07-31 MED ORDER — MIDAZOLAM HCL 5 MG/5ML IJ SOLN
INTRAMUSCULAR | Status: DC | PRN
  Administered 2019-07-31: 2 mg via INTRAVENOUS

## 2019-07-31 MED ORDER — DOCUSATE SODIUM 100 MG PO CAPS
100.0000 mg | ORAL_CAPSULE | Freq: Two times a day (BID) | ORAL | Status: DC
  Administered 2019-07-31 – 2019-08-01 (×2): 100 mg via ORAL
  Filled 2019-07-31 (×2): qty 1

## 2019-07-31 MED ORDER — PROPOFOL 10 MG/ML IV BOLUS
INTRAVENOUS | Status: DC | PRN
  Administered 2019-07-31: 180 mg via INTRAVENOUS
  Administered 2019-07-31: 20 mg via INTRAVENOUS

## 2019-07-31 MED ORDER — FENTANYL CITRATE (PF) 250 MCG/5ML IJ SOLN
INTRAMUSCULAR | Status: DC | PRN
  Administered 2019-07-31 (×7): 50 ug via INTRAVENOUS

## 2019-07-31 MED ORDER — VANCOMYCIN HCL 1000 MG IV SOLR
INTRAVENOUS | Status: DC | PRN
  Administered 2019-07-31: 1000 mg via TOPICAL

## 2019-07-31 MED ORDER — CEFAZOLIN SODIUM-DEXTROSE 2-4 GM/100ML-% IV SOLN
INTRAVENOUS | Status: AC
  Filled 2019-07-31: qty 100

## 2019-07-31 MED ORDER — METOCLOPRAMIDE HCL 5 MG/ML IJ SOLN
5.0000 mg | Freq: Three times a day (TID) | INTRAMUSCULAR | Status: DC | PRN
  Administered 2019-07-31: 15:00:00 10 mg via INTRAVENOUS
  Filled 2019-07-31: qty 2

## 2019-07-31 MED ORDER — HYDROXYZINE HCL 50 MG/ML IM SOLN
50.0000 mg | Freq: Once | INTRAMUSCULAR | Status: AC
  Administered 2019-07-31: 17:00:00 50 mg via INTRAMUSCULAR
  Filled 2019-07-31: qty 1

## 2019-07-31 MED ORDER — ONDANSETRON HCL 4 MG/2ML IJ SOLN: 4.0000 mg | Freq: Four times a day (QID) | INTRAMUSCULAR | Status: DC | PRN

## 2019-07-31 MED ORDER — LIDOCAINE 2% (20 MG/ML) 5 ML SYRINGE
INTRAMUSCULAR | Status: DC | PRN
  Administered 2019-07-31: 50 mg via INTRAVENOUS

## 2019-07-31 MED ORDER — SUGAMMADEX SODIUM 200 MG/2ML IV SOLN
INTRAVENOUS | Status: DC | PRN
  Administered 2019-07-31: 200 mg via INTRAVENOUS

## 2019-07-31 MED ORDER — LACTATED RINGERS IV SOLN
INTRAVENOUS | Status: DC | PRN
  Administered 2019-07-31 (×3): via INTRAVENOUS

## 2019-07-31 MED ORDER — SUCCINYLCHOLINE CHLORIDE 200 MG/10ML IV SOSY
PREFILLED_SYRINGE | INTRAVENOUS | Status: AC
  Filled 2019-07-31: qty 10

## 2019-07-31 MED ORDER — LIDOCAINE 2% (20 MG/ML) 5 ML SYRINGE
INTRAMUSCULAR | Status: AC
  Filled 2019-07-31: qty 5

## 2019-07-31 MED ORDER — TRAMADOL HCL 50 MG PO TABS
50.0000 mg | ORAL_TABLET | Freq: Four times a day (QID) | ORAL | Status: DC
  Administered 2019-07-31 – 2019-08-01 (×3): 50 mg via ORAL
  Filled 2019-07-31 (×3): qty 1

## 2019-07-31 MED ORDER — MENTHOL 3 MG MT LOZG: 1.0000 | LOZENGE | OROMUCOSAL | Status: DC | PRN

## 2019-07-31 MED ORDER — LACTATED RINGERS IV SOLN
INTRAVENOUS | Status: DC
  Administered 2019-07-31: 15:00:00 via INTRAVENOUS

## 2019-07-31 MED ORDER — ROCURONIUM BROMIDE 10 MG/ML (PF) SYRINGE
PREFILLED_SYRINGE | INTRAVENOUS | Status: AC
  Filled 2019-07-31: qty 10

## 2019-07-31 MED ORDER — 0.9 % SODIUM CHLORIDE (POUR BTL) OPTIME
TOPICAL | Status: DC | PRN
  Administered 2019-07-31 (×5): 1000 mL

## 2019-07-31 MED ORDER — CEFAZOLIN SODIUM 1 G IJ SOLR
INTRAMUSCULAR | Status: AC
  Filled 2019-07-31: qty 20

## 2019-07-31 SURGICAL SUPPLY — 94 items
AID PSTN UNV HD RSTRNT DISP (MISCELLANEOUS) ×1
ALCOHOL 70% 16 OZ (MISCELLANEOUS) ×3 IMPLANT
APL PRP STRL LF DISP 70% ISPRP (MISCELLANEOUS) ×2
BEARING HUMERAL +3 40D (Joint) ×2 IMPLANT
BIT DRILL 2.7 W/STOP DISP (BIT) ×1 IMPLANT
BIT DRILL 2.7MM W/STOP DISP (BIT) ×1
BIT DRILL QUICK REL 1/8 2PK SL (DRILL) IMPLANT
BIT DRILL TWIST 2.7 (BIT) ×1 IMPLANT
BIT DRILL TWIST 2.7MM (BIT) ×1
BLADE SAW SGTL 13X75X1.27 (BLADE) ×3 IMPLANT
BRNG HUM +3 40 RVRS SHLDR (Joint) ×1 IMPLANT
BSPLAT GLND LRG AUG TPR ADPR (Joint) ×1 IMPLANT
CHLORAPREP W/TINT 26 (MISCELLANEOUS) ×5 IMPLANT
CLOSURE STERI-STRIP 1/2X4 (GAUZE/BANDAGES/DRESSINGS) ×1
CLOSURE WOUND 1/2 X4 (GAUZE/BANDAGES/DRESSINGS) ×1
CLSR STERI-STRIP ANTIMIC 1/2X4 (GAUZE/BANDAGES/DRESSINGS) ×1 IMPLANT
COMP REV AUG LG W/TAPER/GLENOI (Joint) ×3 IMPLANT
COMPONENT RV AUG LG W/TAPR/GLN (Joint) IMPLANT
COVER SURGICAL LIGHT HANDLE (MISCELLANEOUS) ×3 IMPLANT
COVER WAND RF STERILE (DRAPES) ×1 IMPLANT
DIAL VERSA SHOULDER 40 STD (Joint) ×2 IMPLANT
DRAPE INCISE IOBAN 66X45 STRL (DRAPES) ×3 IMPLANT
DRAPE ORTHO SPLIT 77X108 STRL (DRAPES) ×3
DRAPE SURG ORHT 6 SPLT 77X108 (DRAPES) IMPLANT
DRAPE U-SHAPE 47X51 STRL (DRAPES) ×6 IMPLANT
DRILL QUICK RELEASE 1/8 INCH (DRILL) ×2
DRSG AQUACEL AG ADV 3.5X10 (GAUZE/BANDAGES/DRESSINGS) ×3 IMPLANT
DRSG AQUACEL AG ADV 3.5X14 (GAUZE/BANDAGES/DRESSINGS) ×2 IMPLANT
ELECT BLADE 4.0 EZ CLEAN MEGAD (MISCELLANEOUS) ×3
ELECT REM PT RETURN 9FT ADLT (ELECTROSURGICAL) ×3
ELECTRODE BLDE 4.0 EZ CLN MEGD (MISCELLANEOUS) ×1 IMPLANT
ELECTRODE REM PT RTRN 9FT ADLT (ELECTROSURGICAL) ×1 IMPLANT
GAUZE SPONGE 4X4 12PLY STRL LF (GAUZE/BANDAGES/DRESSINGS) ×3 IMPLANT
GLOVE BIO SURGEON STRL SZ7 (GLOVE) ×2 IMPLANT
GLOVE BIOGEL PI IND STRL 7.0 (GLOVE) ×1 IMPLANT
GLOVE BIOGEL PI IND STRL 8 (GLOVE) ×1 IMPLANT
GLOVE BIOGEL PI INDICATOR 7.0 (GLOVE) ×6
GLOVE BIOGEL PI INDICATOR 8 (GLOVE) ×4
GLOVE ECLIPSE 8.0 STRL XLNG CF (GLOVE) ×2 IMPLANT
GLOVE ECLIPSE 8.5 STRL (GLOVE) ×2 IMPLANT
GLOVE SURG ORTHO 8.0 STRL STRW (GLOVE) ×3 IMPLANT
GOWN STRL REUS W/ TWL LRG LVL3 (GOWN DISPOSABLE) ×2 IMPLANT
GOWN STRL REUS W/ TWL XL LVL3 (GOWN DISPOSABLE) IMPLANT
GOWN STRL REUS W/TWL LRG LVL3 (GOWN DISPOSABLE) ×6
GOWN STRL REUS W/TWL XL LVL3 (GOWN DISPOSABLE)
GUIDE MODEL REV SHLD LT (ORTHOPEDIC DISPOSABLE SUPPLIES) ×2 IMPLANT
HYDROGEN PEROXIDE 16OZ (MISCELLANEOUS) ×3 IMPLANT
JET LAVAGE IRRISEPT WOUND (IRRIGATION / IRRIGATOR) ×3
KIT BASIN OR (CUSTOM PROCEDURE TRAY) ×3 IMPLANT
KIT TURNOVER KIT B (KITS) ×3 IMPLANT
LAVAGE JET IRRISEPT WOUND (IRRIGATION / IRRIGATOR) ×1 IMPLANT
LOOP VESSEL MAXI BLUE (MISCELLANEOUS) ×3 IMPLANT
MANIFOLD NEPTUNE II (INSTRUMENTS) ×3 IMPLANT
NDL SUT 6 .5 CRC .975X.05 MAYO (NEEDLE) ×1 IMPLANT
NDL TAPERED W/ NITINOL LOOP (MISCELLANEOUS) ×1 IMPLANT
NEEDLE MAYO TAPER (NEEDLE) ×3
NEEDLE TAPERED W/ NITINOL LOOP (MISCELLANEOUS) ×3 IMPLANT
NS IRRIG 1000ML POUR BTL (IV SOLUTION) ×3 IMPLANT
PACK SHOULDER (CUSTOM PROCEDURE TRAY) ×3 IMPLANT
PAD ARMBOARD 7.5X6 YLW CONV (MISCELLANEOUS) ×6 IMPLANT
PASSER SUT SWANSON 36MM LOOP (INSTRUMENTS) ×3 IMPLANT
PIN THREADED REVERSE (PIN) ×4 IMPLANT
REAMER GUIDE BUSHING SURG DISP (MISCELLANEOUS) ×2 IMPLANT
REAMER GUIDE W/SCREW AUG (MISCELLANEOUS) ×2 IMPLANT
RESTRAINT HEAD UNIVERSAL NS (MISCELLANEOUS) ×3 IMPLANT
SCREW BONE CORT 6.5X35MM (Screw) ×2 IMPLANT
SCREW BONE LOCKING 4.75X30X3.5 (Screw) ×2 IMPLANT
SCREW BONE LOCKING 4.75X35X3.5 (Screw) ×2 IMPLANT
SCREW LOCKING 4.75MMX15MM (Screw) ×2 IMPLANT
SCREW NON-LOCK 4.75X45X3.5 (Screw) ×4 IMPLANT
SLING ARM FOAM STRAP XLG (SOFTGOODS) ×2 IMPLANT
SOL PREP POV-IOD 4OZ 10% (MISCELLANEOUS) ×3 IMPLANT
SPONGE LAP 18X18 RF (DISPOSABLE) ×3 IMPLANT
STEM HUMERAL STRL 12MMX83MM (Stem) ×2 IMPLANT
STRIP CLOSURE SKIN 1/2X4 (GAUZE/BANDAGES/DRESSINGS) ×2 IMPLANT
SUCTION FRAZIER HANDLE 10FR (MISCELLANEOUS) ×2
SUCTION TUBE FRAZIER 10FR DISP (MISCELLANEOUS) ×1 IMPLANT
SUT BROADBAND TAPE 2PK 1.5 (SUTURE) ×2 IMPLANT
SUT FIBERWIRE #2 38 T-5 BLUE (SUTURE)
SUT MAXBRAID (SUTURE) IMPLANT
SUT MNCRL AB 3-0 PS2 18 (SUTURE) ×3 IMPLANT
SUT SILK 2 0 TIES 10X30 (SUTURE) ×3 IMPLANT
SUT VIC AB 0 CT1 27 (SUTURE) ×12
SUT VIC AB 0 CT1 27XBRD ANBCTR (SUTURE) ×4 IMPLANT
SUT VIC AB 1 CT1 27 (SUTURE) ×6
SUT VIC AB 1 CT1 27XBRD ANBCTR (SUTURE) ×2 IMPLANT
SUT VIC AB 2-0 CT1 27 (SUTURE) ×9
SUT VIC AB 2-0 CT1 TAPERPNT 27 (SUTURE) ×3 IMPLANT
SUT VICRYL 0 UR6 27IN ABS (SUTURE) ×6 IMPLANT
SUTURE FIBERWR #2 38 T-5 BLUE (SUTURE) IMPLANT
TOWEL GREEN STERILE (TOWEL DISPOSABLE) ×3 IMPLANT
TRAY HUM REV SHOULDER STD +6 (Shoulder) ×2 IMPLANT
TRAY URETHRAL CATH 14FR LF (CATHETERS) ×2 IMPLANT
WATER STERILE IRR 1000ML POUR (IV SOLUTION) ×3 IMPLANT

## 2019-07-31 NOTE — Anesthesia Preprocedure Evaluation (Addendum)
Anesthesia Evaluation  Patient identified by MRN, date of birth, ID band Patient awake    Reviewed: Allergy & Precautions, NPO status , Patient's Chart, lab work & pertinent test results, reviewed documented beta blocker date and time   History of Anesthesia Complications (+) PONV  Airway Mallampati: II  TM Distance: >3 FB Neck ROM: Full    Dental no notable dental hx. (+) Dental Advisory Given, Teeth Intact   Pulmonary asthma , sleep apnea and Continuous Positive Airway Pressure Ventilation , former smoker,    breath sounds clear to auscultation       Cardiovascular hypertension, Pt. on medications + CAD   Rhythm:Regular Rate:Normal     Neuro/Psych  Headaches, negative neurological ROS     GI/Hepatic Neg liver ROS, hiatal hernia, GERD  Medicated and Controlled,  Endo/Other  negative endocrine ROS  Renal/GU negative Renal ROS     Musculoskeletal  (+) Arthritis , Osteoarthritis,    Abdominal (+) + obese,  Abdomen: soft. Bowel sounds: normal.  Peds  Hematology  (+) anemia ,   Anesthesia Other Findings   Reproductive/Obstetrics                            Anesthesia Physical Anesthesia Plan  ASA: II  Anesthesia Plan: General and Regional   Post-op Pain Management: GA combined w/ Regional for post-op pain   Induction: Intravenous  PONV Risk Score and Plan:   Airway Management Planned: Oral ETT  Additional Equipment:   Intra-op Plan:   Post-operative Plan: Extubation in OR  Informed Consent: I have reviewed the patients History and Physical, chart, labs and discussed the procedure including the risks, benefits and alternatives for the proposed anesthesia with the patient or authorized representative who has indicated his/her understanding and acceptance.     Dental advisory given  Plan Discussed with: Anesthesiologist and CRNA  Anesthesia Plan Comments:        Anesthesia Quick Evaluation

## 2019-07-31 NOTE — Anesthesia Procedure Notes (Signed)
Procedure Name: Intubation Date/Time: 07/31/2019 7:37 AM Performed by: Renato Shin, CRNA Pre-anesthesia Checklist: Patient identified, Emergency Drugs available, Suction available and Patient being monitored Patient Re-evaluated:Patient Re-evaluated prior to induction Oxygen Delivery Method: Circle system utilized Preoxygenation: Pre-oxygenation with 100% oxygen Induction Type: IV induction Ventilation: Mask ventilation without difficulty and Oral airway inserted - appropriate to patient size Laryngoscope Size: Miller and 3 Grade View: Grade I Tube type: Oral Tube size: 7.5 mm Number of attempts: 1 Airway Equipment and Method: Stylet and Oral airway Placement Confirmation: ETT inserted through vocal cords under direct vision,  positive ETCO2 and breath sounds checked- equal and bilateral Secured at: 21 cm Tube secured with: Tape Dental Injury: Teeth and Oropharynx as per pre-operative assessment

## 2019-07-31 NOTE — Op Note (Signed)
NAME: Taylor Schroeder, Taylor Schroeder MEDICAL RECORD [id number] ACCOUNT [id number] DATE OF BIRTH:June 09, 1966 FACILITY: [facility] LOCATION: [facility] PHYSICIAN:GREGORY Randel Pigg, MD  OPERATIVE REPORT  DATE OF PROCEDURE:  07/31/2019  PREOPERATIVE DIAGNOSIS:  Left shoulder arthritis.  POSTOPERATIVE DIAGNOSIS:  Left shoulder arthritis.  PROCEDURE:  Left reverse shoulder replacement using [employer] components, including 12 x 83 mini humeral stem with +6 taper offset, 40 mm in diameter with +3 thickness cross-linked polyethylene bearing, including standard offset 40 mm diameter  glenosphere with large augmented baseplate, 1 central compression screw and 4 peripheral locking screws.  SURGEON:  Meredith Pel, MD  ASSISTANT:  Annie Main PA.  INDICATIONS:  The patient is a 53 year old patient with severe end-stage left shoulder arthritis.  Preoperative imaging demonstrates retroversion to 40 degrees.  He is not a candidate for total shoulder replacement.  He reports unrelenting in  debilitating pain which has failed conservative management.  He  presents now for operative management after explanation of risks and benefits including but not limited implant longevity, lifelong lifting restrictions, as well as potential for infection,  nerve or vessel damage and instability.  PROCEDURE IN DETAIL:  The patient was brought to the operating room where general endotracheal anesthesia was induced.  Preoperative antibiotics administered.  Timeout was called.  The patient was placed in the beach chair position with head in neutral  position.  Left arm, hand and shoulder area were pre-scrubbed with hydrogen peroxide, followed by alcohol and Betadine, which was allowed to air dry.  ChloraPrep was then utilized.  Ioban used to cover the operative field.  Timeout was called.   Deltopectoral approach was made.  Cephalic vein mobilized medially.  The patient had a significant medialization of his shoulder joint.  The patient  had a large loose body just behind the subscapularis.  Head was significantly deformed.  At this time,  the coracohumeral ligament was released.  Manual elevation of the deltoid off its anterior insertion was performed.  The biceps tendon was tenodesed to the pectoralis major tendon, which was released about 1.5 cm.  The circumflex vessels were then  coagulated.  Subscapularis was then tagged and detached from the lesser tuberosity.  That release was taken around to the 7 o'clock position on the humeral head.  A large osteophyte present inferiorly, which was removed.  At this time, the axillary nerve  was identified and a vessel loop placed around it.  It was protected at all times during the remaining portion of the case.  The head eventually was dislocated.  The patient had continuous superior rotator cuff, which was then released back to the  infraspinatus in order to aid with mobilization.  The humeral head was then reamed and broached up to a size 12.  An 11 broach was placed along with a cap.  At this time, attention was directed towards the glenoid exposure.  Circumferential release of  the capsule was performed with care being taken to avoid injury to the axillary nerve.  Following this circumferential release, patient-specific instrumentation was utilized and the guide was placed on the glenoid.  Good pin placement was confirmed with  the model.  Reaming which was primarily anterior was then performed.  This was then checked with the appropriate checking devices.  The posterior, slightly superior reaming was then performed for the augment.  Trial baseplate was placed and it had very  good contact.  The rue baseplate was then placed after thorough irrigation.  Irrisept utilized throughout the entire case for diminishing  chance of infection.  At this time,  1 central nonlocking screw was placed with excellent compression, followed by 4  peripheral locking screws.  The glenoid from the ____  glenoid  rim, which was exposed beyond the face plate was removed with a rongeur.  A trial with a +3 and standard glenosphere was made.  Due to the significant tightness of the shoulder joint, the  standard glenosphere was chosen and placed with about 1.5 mm inferior offset.  Next, attention was directed towards the humerus.  Humeral neck had been cut in 30 degrees of retroversion.  This neck was recut in order to facilitate space creation within  the glenohumeral joint.  This was required in order to fit the thinnest implant in.  After revision cut, a broach was placed and trial reduction was performed with both the standard and +3 thickness polyethylene bearing.  The +3 thickness gave the best  stability with good external rotation, forward flexion, abduction, as well as good stability with adduction and extension and a forward force.  At this time, true components were placed with same stability parameters maintained.  Axillary nerve was  palpated and found to be intact.  The shoulder joint was then irrigated with 3 L of irrigating solution, followed by Irrisept, followed by vancomycin powder.  The subscapularis was not closeable due to the retraction aspect.  Even with mobilization, it  would not reach the lesser tuberosity in 45 degrees of external rotation.  Next, the deltopectoral interval was closed using #1 Vicryl suture in watertight fashion.  Irrigation again and vancomycin in addition to the skin closure was then performed.   Then the skin was closed using 0 Vicryl suture, 2-0 Vicryl suture and a 3-0 Monocryl, followed by Steri-Strips and Aquacel dressing.  Shoulder sling applied.  The patient tolerated the procedure well without immediate complication.  Luke's assistance was  required at all times during the case for retraction, opening and closing.  His assistance was a medical necessity for this difficult case.  The patient transferred to the recovery room in stable condition.  VN/NUANCE   D:07/31/2019 T:07/31/2019 JOB:[id number]

## 2019-07-31 NOTE — Anesthesia Procedure Notes (Signed)
Anesthesia Regional Block: Interscalene brachial plexus block   Pre-Anesthetic Checklist: ,, timeout performed, Correct Patient, Correct Site, Correct Laterality, Correct Procedure, Correct Position, site marked, Risks and benefits discussed,  Surgical consent,  Pre-op evaluation,  At surgeon's request and post-op pain management  Laterality: Left  Prep: chloraprep       Needles:  Injection technique: Single-shot  Needle Type: Stimulator Needle - 40      Needle Gauge: 22     Additional Needles:   Procedures:, nerve stimulator,,,,,,,  Narrative:  Start time: 07/31/2019 7:10 AM End time: 07/31/2019 7:15 AM Injection made incrementally with aspirations every 5 mL.  Performed by: Personally   Additional Notes: 15 cc 0.5% Bupivacaine 1:200  10 cc 1.3%

## 2019-07-31 NOTE — Brief Op Note (Signed)
07/31/2019 a  07/31/2019  12:05 PM  PATIENT:  Blain Pais  53 y.o. male  PRE-OPERATIVE DIAGNOSIS:  left shoulder osteoarthritis  POST-OPERATIVE DIAGNOSIS:  left shoulder osteoarthritis  PROCEDURE:  Procedure(s): LEFT REVERSE SHOULDER ARTHROPLASTY  SURGEON:  Surgeon(s): Meredith Pel, MD  ASSISTANT: magnant pa  ANESTHESIA:   general  EBL: 250 ml    Total I/O In: 1250 [I.V.:1000; IV Piggyback:250] Out: 250 [Blood:250]  BLOOD ADMINISTERED: none  DRAINS: none   LOCAL MEDICATIONS USED:  none  SPECIMEN:  No Specimen  COUNTS:  YES  TOURNIQUET:  * No tourniquets in log *  DICTATION: .Other Dictation: Dictation Number [id number]  PLAN OF CARE: Admit for overnight observation  PATIENT DISPOSITION:  PACU - hemodynamically stable

## 2019-07-31 NOTE — Transfer of Care (Signed)
Immediate Anesthesia Transfer of Care Note  Patient: Taylor Schroeder  Procedure(s) Performed: LEFT REVERSE SHOULDER ARTHROPLASTY (Left Shoulder)  Patient Location: PACU  Anesthesia Type:General and GA combined with regional for post-op pain  Level of Consciousness: awake, drowsy and patient cooperative  Airway & Oxygen Therapy: Patient Spontanous Breathing and Patient connected to face mask oxygen  Post-op Assessment: Report given to RN and Post -op Vital signs reviewed and stable  Post vital signs: Reviewed and stable  Last Vitals:  Vitals Value Taken Time  BP 121/69 07/31/19 1225  Temp    Pulse 105 07/31/19 1229  Resp 19 07/31/19 1229  SpO2 94 % 07/31/19 1229  Vitals shown include unvalidated device data.  Last Pain:  Vitals:   07/31/19 0609  TempSrc: Oral  PainSc:       Patients Stated Pain Goal: 3 ([id number])  Complications: No apparent anesthesia complications

## 2019-07-31 NOTE — Anesthesia Postprocedure Evaluation (Signed)
Anesthesia Post Note  Patient: Taylor Schroeder  Procedure(s) Performed: LEFT REVERSE SHOULDER ARTHROPLASTY (Left Shoulder)     Patient location during evaluation: PACU Anesthesia Type: Regional and General Level of consciousness: awake and alert Pain management: pain level controlled Vital Signs Assessment: post-procedure vital signs reviewed and stable Respiratory status: spontaneous breathing, nonlabored ventilation, respiratory function stable and patient connected to nasal cannula oxygen Cardiovascular status: blood pressure returned to baseline and stable Postop Assessment: no apparent nausea or vomiting Anesthetic complications: no    Last Vitals:  Vitals:   07/31/19 1340 07/31/19 1403  BP: 119/80 111/80  Pulse: (!) 101 97  Resp: 13 20  Temp: (!) 36.1 C 36.7 C  SpO2: 95% 94%    Last Pain:  Vitals:   07/31/19 1403  TempSrc: Oral  PainSc:                  Kimala Horne COKER

## 2019-07-31 NOTE — H&P (Signed)
Taylor Schroeder is an 53 y.o. male.   Chief Complaint: Left shoulder pain HPI: Taylor Schroeder is a 53 year old patient with left shoulder pain.  He has had this for many years.  Pain and function have been getting significantly worse over the past 2 years.  He reports night pain rest pain and pain which interferes with his activities of daily living.  MRI scanning demonstrates significant glenohumeral arthritis.  CT scanning demonstrates glenoid retroversion of about 41 degrees.  Vault reconstruction system considered but reverse shoulder replacement should be able to maintain enough glenoid bone stock for baseplate placement with large augment.  Patient is not a candidate for total shoulder replacement due to the amount of retroversion.  Past Medical History:  Diagnosis Date  . Arthritis    "qwhere" (02/03/2017)  . BPH (benign prostatic hyperplasia)   . Childhood asthma    "as a baby"  . Coronary artery disease Non-obstructive   a. nonobs cath in 2006;  b. 01/2013 Cath: LM nl, LAD 5m, LCX nl, RCA non-dom, nl. EF 60-65%.  . Esophageal ring, acquired 07/12/2013  . GERD (gastroesophageal reflux disease)   . H/O hiatal hernia    "think so" (02/06/2014)  . Headache    "resolved w/BP control in 11/2015" (02/03/2017)  . High cholesterol   . History of kidney stones   . Hypertension   . Ileitis 2014   Suspected Crohn's, think not as time has gone on  . Obesity   . Pernicious anemia-B12 deficiency 04/18/2013  . PONV (postoperative nausea and vomiting) 02/03/2017; 08/2016  . Sleep apnea    cpap every night    Past Surgical History:  Procedure Laterality Date  . APPENDECTOMY    . CARDIAC CATHETERIZATION  01/2013   "results were clear"  . CARPAL TUNNEL RELEASE Right   . CHOLECYSTECTOMY N/A 02/03/2017   Procedure: LAPAROSCOPIC CHOLECYSTECTOMY;  Surgeon: Greer Pickerel, MD;  Location: [facility];  Service: General;  Laterality: N/A;  . COLONOSCOPY  ~ 2014  . COLONOSCOPY    . ESOPHAGOGASTRODUODENOSCOPY  ~ 2014   . INGUINAL HERNIA REPAIR  1966   "? side"  . KNEE ARTHROPLASTY Left 02/06/2014   Procedure: COMPUTER ASSISTED TOTAL KNEE ARTHROPLASTY;  Surgeon: [name], MD;  Location: [facility];  Service: Orthopedics;  Laterality: Left;  Cemented Left Total Knee Arthroplasty  . KNEE ARTHROSCOPY Left 08/2016   scar tissue cleaned out  . LAPAROSCOPIC CHOLECYSTECTOMY  02/03/2017  . LEFT HEART CATHETERIZATION WITH CORONARY ANGIOGRAM N/A 01/29/2013   Procedure: LEFT HEART CATHETERIZATION WITH CORONARY ANGIOGRAM;  Surgeon: Thayer Headings, MD;  Location: [facility] CATH LAB;  Service: Cardiovascular;  Laterality: N/A;  . MULTIPLE TOOTH EXTRACTIONS  09/2016  . SHOULDER ARTHROSCOPY Left 03/12/2013  . TOTAL KNEE ARTHROPLASTY Left 02/06/2014  . TOTAL KNEE ARTHROPLASTY Right 09/14/2017  . TOTAL KNEE ARTHROPLASTY Right 09/14/2017   Procedure: RIGHT TOTAL KNEE ARTHROPLASTY;  Surgeon: [name], MD;  Location: [facility] [facility];  Service: Orthopedics;  Laterality: Right;    Family History  Problem Relation Age of Onset  . Hypertension Mother   . Cancer Father        type unknown  . COPD Other   . Emphysema Maternal Grandmother   . Emphysema Maternal Grandfather   . Other Paternal Grandmother        spinal cancer  . Alcohol abuse Neg Hx   . Diabetes Neg Hx   . Early death Neg Hx   . Heart disease Neg Hx   .  Hyperlipidemia Neg Hx   . Kidney disease Neg Hx   . Stroke Neg Hx   . Colon cancer Neg Hx   . Stomach cancer Neg Hx   . Esophageal cancer Neg Hx   . Rectal cancer Neg Hx   . Liver cancer Neg Hx    Social History:  reports that he quit smoking about 10 years ago. He smoked 0.00 packs per day for 30.00 years. He has never used smokeless tobacco. He reports that he does not drink alcohol or use drugs.  Allergies:  Allergies  Allergen Reactions  . Allegra [Fexofenadine Hcl] Hives  . Hctz [Hydrochlorothiazide] Other (See Comments)    Weak and muscle cramps   . Demerol [Meperidine] Nausea And Vomiting    "Bad things"  happen when I take it"    Medications Prior to Admission  Medication Sig Dispense Refill  . Cholecalciferol 50 MCG (2000 UT) TABS Take 2 tablets (4,000 Units total) by mouth daily. (Patient taking differently: Take 4,000 Units by mouth daily. ) 180 tablet 1  . cyanocobalamin 2000 MCG tablet Take 1 tablet (2,000 mcg total) by mouth daily. 90 tablet 1  . esomeprazole (NEXIUM) 40 MG capsule Take 40 mg by mouth daily at 12 noon.    Marland Kitchen ibuprofen (ADVIL) 200 MG tablet Take 200-400 mg by mouth every 8 (eight) hours as needed for moderate pain.    . mometasone-formoterol (DULERA) 100-5 MCG/ACT AERO Inhale 2 puffs into the lungs 2 (two) times daily as needed for wheezing or shortness of breath.    . telmisartan (MICARDIS) 80 MG tablet TAKE 1 TABLET BY MOUTH EVERY DAY (Patient taking differently: Take 80 mg by mouth daily. ) 90 tablet 1  . cetirizine (ZYRTEC) 10 MG tablet Take 10 mg by mouth daily.    . indapamide (LOZOL) 1.25 MG tablet TAKE 1 TABLET BY MOUTH EVERY DAY 90 tablet 0  . levocetirizine (XYZAL) 5 MG tablet Take 1 tablet (5 mg total) by mouth every evening. (Patient not taking: Reported on 07/19/2019) 90 tablet 1  . montelukast (SINGULAIR) 10 MG tablet Take 1 tablet (10 mg total) by mouth at bedtime. (Patient not taking: Reported on 07/19/2019) 90 tablet 1  . triamcinolone (NASACORT ALLERGY 24HR) 55 MCG/ACT AERO nasal inhaler Place 4 sprays into the nose daily. (Patient not taking: Reported on 07/19/2019) 3 Inhaler 1    No results found for this or any previous visit (from the past 48 hour(s)). No results found.  Review of Systems  Musculoskeletal: Positive for joint pain.  All other systems reviewed and are negative.   Blood pressure 125/84, pulse 91, temperature 98.1 F (36.7 C), temperature source Oral, resp. rate 20, height 5' 9.5" (1.765 m), weight 114.8 kg, SpO2 95 %. Physical Exam  Constitutional: He appears well-developed.  HENT:  Head: Normocephalic.  Eyes: Pupils are equal,  round, and reactive to light.  Neck: Normal range of motion.  Cardiovascular: Normal rate.  Respiratory: Effort normal.  Neurological: He is alert.  Skin: Skin is warm.  Psychiatric: He has a normal mood and affect.  Examination of the left shoulder demonstrates functional deltoid with reasonable rotator cuff strength.  Passive range of motion is limited to about 50 degrees of isolated glenohumeral abduction about 40 degrees of isolated glenohumeral forward flexion and about 5 to 10 degrees of external rotation.  Radial pulses intact.  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  Preoperative patient specific planning is demonstrated glenoid retroversion of about 41  degrees.  Plan is for reverse shoulder replacement.  Augmented glenoid's can only really correct about 20 degrees of deformity.  Even though he is on the young side for reverse shoulder replacement his pain and functional level is becoming untenable at this time.  The risk and benefits of surgery including but not limited to infection nerve vessel damage dislocation as well as potential need for revision which may or may not be possible in his lifetime are also discussed.  Patient understands risk benefits and wishes to proceed.  Also discussed with him at length the activity restrictions which would be required for longevity of this implant postoperatively.  Patient understands that as well.  All questions answered.  Anderson Malta, MD 07/31/2019, 7:20 AM

## 2019-08-01 ENCOUNTER — Encounter (HOSPITAL_COMMUNITY): Payer: Self-pay | Admitting: Orthopedic Surgery

## 2019-08-01 ENCOUNTER — Other Ambulatory Visit: Payer: Self-pay

## 2019-08-01 DIAGNOSIS — M19012 Primary osteoarthritis, left shoulder: Secondary | ICD-10-CM | POA: Diagnosis not present

## 2019-08-01 MED ORDER — OXYCODONE HCL 5 MG PO TABS: 5.0000 mg | ORAL_TABLET | ORAL | 0 refills | Status: DC | PRN

## 2019-08-01 MED ORDER — METHOCARBAMOL 500 MG PO TABS: 500.0000 mg | ORAL_TABLET | Freq: Three times a day (TID) | ORAL | 0 refills | Status: DC | PRN

## 2019-08-01 MED ORDER — ASPIRIN 81 MG PO CHEW: 81.0000 mg | CHEWABLE_TABLET | Freq: Every day | ORAL | 0 refills | Status: DC

## 2019-08-01 NOTE — Progress Notes (Signed)
  Subjective: Taylor Schroeder is a 53 y.o. male s/p left RSA.  They are POD1.  Pt's pain is controlled. Block wearing off but still in effect on radial side of hand.    Objective: Vital signs in last 24 hours: Temp:  [97 F (36.1 C)-98.3 F (36.8 C)] 98.1 F (36.7 C) (11/04 0717) Pulse Rate:  [86-113] 86 (11/04 0717) Resp:  [10-20] 18 (11/04 0717) BP: (99-138)/(63-88) 138/88 (11/04 0717) SpO2:  [92 %-97 %] 96 % (11/04 0717)  Intake/Output from previous day: 11/03 0701 - 11/04 0700 In: 3000 [P.O.:600; I.V.:2150; IV Piggyback:250] Out: 450 [Urine:200; Blood:250] Intake/Output this shift: Total I/O In: 360 [P.O.:360] Out: -   Exam:  No gross blood or drainage overlying the dressing 2+ radial pulse Sensation intact distally in the left hand on ulnar side of hand.  Sensation intact but diminished in radial side of hand Able to extend the left wrist EPL, IP flexion intact. Finger abduction/adduction intact   Labs: No results for input(s): HGB in the last 72 hours. No results for input(s): WBC, RBC, HCT, PLT in the last 72 hours. No results for input(s): NA, K, CL, CO2, BUN, CREATININE, GLUCOSE, CALCIUM in the last 72 hours. No results for input(s): LABPT, INR in the last 72 hours.  Assessment/Plan: Pt is POD1 s/p left RSA    -Plan to discharge to home today pending PT  -No lifting with the operative arm  -Okay to shower, dressing is waterproof.  Cautioned patient against soaking dressing in bath/pool/body of water  -Use the CPM machine at least 3 times per day for one hour each time, increasing the degrees daily.     [name] 08/01/2019, 8:39 AM

## 2019-08-01 NOTE — Evaluation (Signed)
Occupational Therapy Evaluation and Discharge Patient Details Name: Taylor Schroeder MRN: [id number] DOB: June 19, 1966 Today's Date: 08/01/2019    History of Present Illness Pt is a 53 y/o male who presents s/p L reverse shoulder arthroplasty on 07/31/2019. PMH significant for bilateral TKR, L shoulder arthroscopy 2014, HTN, CAD.    Clinical Impression   Pt educated in positioning L UE in bed and chair, sling use, ROM L elbow to hand and compensatory strategies for ADL. Reinforced with written handout. Pt verbalized and/or demonstrated understanding.     Follow Up Recommendations  Follow surgeon's recommendation for DC plan and follow-up therapies    Equipment Recommendations  None recommended by OT    Recommendations for Other Services       Precautions / Restrictions Precautions Precautions: Shoulder Type of Shoulder Precautions: no shoulder movement in order set Shoulder Interventions: Shoulder sling/immobilizer;Off for dressing/bathing/exercises Precaution Booklet Issued: Yes (comment) Required Braces or Orthoses: Sling Restrictions Weight Bearing Restrictions: Yes LUE Weight Bearing: Non weight bearing      Mobility Bed Mobility Overal bed mobility: Modified Independent   Transfers Overall transfer level: Independent Equipment used: None                 Balance Overall balance assessment: Mild deficits observed, not formally tested                                         ADL either performed or assessed with clinical judgement   ADL Overall ADL's : Modified independent                                       General ADL Comments: Educated pt in use of sling and adjusted fit. Instructed in positioning L UE in bed and chair and compensatory strategies for ADL.      Vision Baseline Vision/History: Wears glasses Patient Visual Report: No change from baseline       Perception     Praxis      Pertinent Vitals/Pain Pain  Assessment: 0-10 Faces Pain Scale: Hurts even more Pain Location: L Shoulder Pain Descriptors / Indicators: Sore Pain Intervention(s): Ice applied;Patient requesting pain meds-RN notified;Repositioned     Hand Dominance Right   Extremity/Trunk Assessment Upper Extremity Assessment Upper Extremity Assessment: LUE deficits/detail LUE Deficits / Details: performed AAROM elbow to hand x 5, nerve block still partially intact LUE: Unable to fully assess due to immobilization LUE Coordination: decreased gross motor   Lower Extremity Assessment Lower Extremity Assessment: Overall WFL for tasks assessed   Cervical / Trunk Assessment Cervical / Trunk Assessment: Normal   Communication Communication Communication: No difficulties   Cognition Arousal/Alertness: Awake/alert Behavior During Therapy: WFL for tasks assessed/performed Overall Cognitive Status: Within Functional Limits for tasks assessed                                     General Comments       Exercises     Shoulder Instructions      Home Living Family/patient expects to be discharged to:: Private residence Living Arrangements: Spouse/significant other Available Help at Discharge: Family Type of Home: House Home Access: Stairs to enter Technical brewer of Steps: 2 Entrance Stairs-Rails: Right;Left Home Layout: One level  Bathroom Shower/Tub: Occupational psychologist: None          Prior Functioning/Environment Level of Independence: Independent        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        OT Problem List:        OT Treatment/Interventions:      OT Goals(Current goals can be found in the care plan section) Acute Rehab OT Goals Patient Stated Goal: Home today  OT Frequency:     Barriers to D/C:            Co-evaluation              AM-PAC OT "6 Clicks" Daily Activity     Outcome Measure Help  from another person eating meals?: None Help from another person taking care of personal grooming?: None Help from another person toileting, which includes using toliet, bedpan, or urinal?: None Help from another person bathing (including washing, rinsing, drying)?: None Help from another person to put on and taking off regular upper body clothing?: None Help from another person to put on and taking off regular lower body clothing?: None 6 Click Score: 24   End of Session Nurse Communication: Patient requests pain meds  Activity Tolerance: Patient tolerated treatment well Patient left: in bed;with call bell/phone within reach  OT Visit Diagnosis: Pain                Time: [id number] OT Time Calculation (min): 24 min Charges:  OT General Charges $OT Visit: 1 Visit OT Evaluation $OT Eval Low Complexity: 1 Low OT Treatments $Self Care/Home Management : 8-22 mins  Nestor Lewandowsky, OTR/L Acute Rehabilitation Services Pager: [phone] Office: [phone]  Malka So 08/01/2019, 12:05 PM

## 2019-08-01 NOTE — Evaluation (Signed)
Physical Therapy Evaluation and Discharge Patient Details Name: Taylor Schroeder MRN: [id number] DOB: 1966/07/21 Today's Date: 08/01/2019   History of Present Illness  Pt is a 53 y/o male who presents s/p L reverse shoulder arthroplasty on 07/31/2019. PMH significant for bilateral TKR, L shoulder arthroscopy 2014, HTN, CAD.   Clinical Impression  Patient evaluated by Physical Therapy with no further acute PT needs identified. All education has been completed from a PT perspective and the patient has no further questions. At the time of evaluation, pt was able to complete bed mobility with light supervision and OOB mobility with modified independence and no AD. Pt was educated on positioning recommendations, car transfer, sling adjustment, and activity progression separate from specific shoulder precautions/protocol. See below for any follow-up Physical Therapy or equipment needs. PT is signing off. Thank you for this referral.     Follow Up Recommendations No PT follow up;Supervision - Intermittent; Outpatient therapy as appropriate per post-op protocol and OT recommendations.    Equipment Recommendations  None recommended by PT    Recommendations for Other Services       Precautions / Restrictions Precautions Precautions: Shoulder Shoulder Interventions: Shoulder sling/immobilizer;Off for dressing/bathing/exercises Precaution Booklet Issued: No Restrictions Weight Bearing Restrictions: No      Mobility  Bed Mobility Overal bed mobility: Needs Assistance Bed Mobility: Supine to Sit     Supine to sit: Supervision     General bed mobility comments: HOB slightly elevated. Pt was able to transition to EOB with light supervision. Reports he gets out on the L side of the bed at home - we talked about how he would transition to the L safely however pt declined practicing.   Transfers Overall transfer level: Modified independent Equipment used: None             General transfer  comment: No assist to power-up to full standing position. No unsteadiness or LOB noted.   Ambulation/Gait Ambulation/Gait assistance: Modified independent (Device/Increase time) Gait Distance (Feet): 400 Feet Assistive device: None Gait Pattern/deviations: WFL(Within Functional Limits) Gait velocity: Decreased Gait velocity interpretation: 1.31 - 2.62 ft/sec, indicative of limited community ambulator General Gait Details: Mildly guarded but overall WFL.   Stairs         General stair comments: Pt declined practicing the stairs, reporting he feels confident entering his home. Verbally discussed general safety.  Wheelchair Mobility    Modified Rankin (Stroke Patients Only)       Balance Overall balance assessment: Mild deficits observed, not formally tested                                           Pertinent Vitals/Pain Pain Assessment: Faces Faces Pain Scale: Hurts little more Pain Location: L Shoulder/neck from sling Pain Descriptors / Indicators: Operative site guarding Pain Intervention(s): Limited activity within patient's tolerance;Monitored during session;Repositioned    Home Living Family/patient expects to be discharged to:: Private residence Living Arrangements: Spouse/significant other Available Help at Discharge: Family Type of Home: House Home Access: Stairs to enter Entrance Stairs-Rails: Psychiatric nurse of Steps: 2 Home Layout: One level Home Equipment: None      Prior Function Level of Independence: Independent         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 Dominance   Dominant Hand: Right    Extremity/Trunk Assessment   Upper Extremity Assessment  Upper Extremity Assessment: LUE deficits/detail;Defer to OT evaluation LUE Deficits / Details: s/p surgery    Lower Extremity Assessment Lower Extremity Assessment: Overall WFL for tasks assessed    Cervical / Trunk  Assessment Cervical / Trunk Assessment: Normal  Communication   Communication: No difficulties  Cognition Arousal/Alertness: Awake/alert Behavior During Therapy: WFL for tasks assessed/performed Overall Cognitive Status: Within Functional Limits for tasks assessed                                        General Comments      Exercises     Assessment/Plan    PT Assessment Patent does not need any further PT services  PT Problem List         PT Treatment Interventions      PT Goals (Current goals can be found in the Care Plan section)  Acute Rehab PT Goals Patient Stated Goal: Home today PT Goal Formulation: All assessment and education complete, DC therapy    Frequency     Barriers to discharge        Co-evaluation               AM-PAC PT "6 Clicks" Mobility  Outcome Measure Help needed turning from your back to your side while in a flat bed without using bedrails?: None Help needed moving from lying on your back to sitting on the side of a flat bed without using bedrails?: None Help needed moving to and from a bed to a chair (including a wheelchair)?: None Help needed standing up from a chair using your arms (e.g., wheelchair or bedside chair)?: None Help needed to walk in hospital room?: None Help needed climbing 3-5 steps with a railing? : None 6 Click Score: 24    End of Session Equipment Utilized During Treatment: Gait belt;Other (comment)(L shoulder sling) Activity Tolerance: Patient tolerated treatment well Patient left: with call bell/phone within reach(Sitting EOB awaiting OT) Nurse Communication: Mobility status PT Visit Diagnosis: Pain;Difficulty in walking, not elsewhere classified (R26.2) Pain - Right/Left: Left Pain - part of body: Shoulder    Time: [id number] PT Time Calculation (min) (ACUTE ONLY): 13 min   Charges:   PT Evaluation $PT Eval Moderate Complexity: 1 Mod          Taylor Schroeder, PT, DPT Acute  Rehabilitation Services Pager: [phone] Office: [phone]   Thelma Comp 08/01/2019, 10:16 AM

## 2019-08-06 NOTE — Discharge Summary (Signed)
Physician Discharge Summary      Patient ID: Taylor Schroeder MRN: [id number] DOB/AGE: April 18, 1966 53 y.o.  Admit date: 07/31/2019 Discharge date: 08/01/2019  Admission Diagnoses:  Active Problems:   OA (osteoarthritis) of shoulder   Discharge Diagnoses:  Same  Surgeries: Procedure(s): LEFT REVERSE SHOULDER ARTHROPLASTY on 07/31/2019   Consultants:   Discharged Condition: Stable  Hospital Course: Taylor Schroeder is an 53 y.o. male who was admitted 07/31/2019 with a chief complaint of left shoulder pain, and found to have a diagnosis of left shoulder osteoarthritis.  They were brought to the operating room on 07/31/2019 and underwent the above named procedures.  Pt awoke from anesthesia without complication and was transferred to the floor. On POD1, patient was doing well and his block was wearing off.  Patient had full motor function of the left hand.  He was discharged home on POD1.  Pt will f/u with Dr. Marlou Sa in clinic in ~2 weeks.   Antibiotics given:  Anti-infectives (From admission, onward)   Start     Dose/Rate Route Frequency Ordered Stop   07/31/19 1800  ceFAZolin (ANCEF) IVPB 2g/100 mL premix     2 g 200 mL/hr over 30 Minutes Intravenous Every 6 hours 07/31/19 1352 08/01/19 0934   07/31/19 1122  vancomycin (VANCOCIN) powder  Status:  Discontinued       As needed 07/31/19 1122 07/31/19 1222   07/31/19 0600  ceFAZolin (ANCEF) IVPB 2g/100 mL premix     2 g 200 mL/hr over 30 Minutes Intravenous On call to O.R. 07/31/19 0553 07/31/19 1136   07/31/19 0555  ceFAZolin (ANCEF) 2-4 GM/100ML-% IVPB    Note to Pharmacy: Tamsen Snider   : cabinet override      07/31/19 0555 07/31/19 0740    .  Recent vital signs:  Vitals:   08/01/19 0459 08/01/19 0717  BP: 99/74 138/88  Pulse: 96 86  Resp: 18 18  Temp: (!) 97.5 F (36.4 C) 98.1 F (36.7 C)  SpO2: 94% 96%    Recent laboratory studies:  Results for orders placed or performed during the hospital encounter of 07/27/19    Novel Coronavirus, NAA (Hosp order, Send-out to Ref Lab; TAT 18-24 hrs   Specimen: Nasopharyngeal Swab; Respiratory  Result Value Ref Range   SARS-CoV-2, NAA NOT DETECTED NOT DETECTED   Coronavirus Source NASOPHARYNGEAL     Discharge Medications:   Allergies as of 08/01/2019      Reactions   Allegra [fexofenadine Hcl] Hives   Hctz [hydrochlorothiazide] Other (See Comments)   Weak and muscle cramps   Demerol [meperidine] Nausea And Vomiting   "Bad things" happen when I take it"      Medication List    STOP taking these medications   levocetirizine 5 MG tablet Commonly known as: Xyzal   montelukast 10 MG tablet Commonly known as: SINGULAIR   triamcinolone 55 MCG/ACT Aero nasal inhaler Commonly known as: Nasacort Allergy 24HR     TAKE these medications   aspirin 81 MG chewable tablet Chew 1 tablet (81 mg total) by mouth daily.   cetirizine 10 MG tablet Commonly known as: ZYRTEC Take 10 mg by mouth daily.   Cholecalciferol 50 MCG (2000 UT) Tabs Take 2 tablets (4,000 Units total) by mouth daily.   cyanocobalamin 2000 MCG tablet Take 1 tablet (2,000 mcg total) by mouth daily.   esomeprazole 40 MG capsule Commonly known as: NEXIUM Take 40 mg by mouth daily at 12 noon.   ibuprofen 200 MG tablet  Commonly known as: ADVIL Take 200-400 mg by mouth every 8 (eight) hours as needed for moderate pain.   indapamide 1.25 MG tablet Commonly known as: LOZOL TAKE 1 TABLET BY MOUTH EVERY DAY   methocarbamol 500 MG tablet Commonly known as: ROBAXIN Take 1 tablet (500 mg total) by mouth every 8 (eight) hours as needed for muscle spasms.   mometasone-formoterol 100-5 MCG/ACT Aero Commonly known as: DULERA Inhale 2 puffs into the lungs 2 (two) times daily as needed for wheezing or shortness of breath.   oxyCODONE 5 MG immediate release tablet Commonly known as: Oxy IR/ROXICODONE Take 1 tablet (5 mg total) by mouth every 4 (four) hours as needed for moderate pain (pain score  4-6).   telmisartan 80 MG tablet Commonly known as: MICARDIS TAKE 1 TABLET BY MOUTH EVERY DAY       Diagnostic Studies: Dg Shoulder Left Port  Result Date: 07/31/2019 CLINICAL DATA:  53 year old male status post left shoulder surgery. EXAM: LEFT SHOULDER - 1 VIEW COMPARISON:  Chest radiographs 11/20/2018. FINDINGS: AP portable view at 1236 hours. Right total shoulder arthroplasty hardware in place. Alignment appears satisfactory on this view. Regional postoperative gas. No unexpected osseous changes identified. IMPRESSION: Left shoulder arthroplasty with no adverse features. Electronically Signed   By: Genevie Ann M.D.   On: 07/31/2019 12:48    Disposition:   Discharge Instructions    Call MD / Call 911   Complete by: As directed    If you experience chest pain or shortness of breath, CALL 911 and be transported to the hospital emergency room.  If you develope a fever above 101 F, pus (white drainage) or increased drainage or redness at the wound, or calf pain, call your surgeon's office.   Constipation Prevention   Complete by: As directed    Drink plenty of fluids.  Prune juice may be helpful.  You may use a stool softener, such as Colace (over the counter) 100 mg twice a day.  Use MiraLax (over the counter) for constipation as needed.   Diet - low sodium heart healthy   Complete by: As directed    Discharge instructions   Complete by: As directed    You may shower, dressing is waterproof.  Do not bathe or soak the operative shoulder in a tub, pool.  Use the CPM machine 3 times a day for one hour each time.  No lifting with the operative shoulder. Continue use of the sling.  Follow-up with Dr. Marlou Sa in ~2 weeks on your given appointment date.  We will remove your adhesive bandage at that time.  If you develop lightheadedness or dizziness, call the office.   Increase activity slowly as tolerated   Complete by: As directed          Signed: Donella Stade 08/06/2019, 8:47 AM

## 2019-08-10 ENCOUNTER — Telehealth: Payer: Self-pay | Admitting: Orthopedic Surgery

## 2019-08-10 NOTE — Telephone Encounter (Signed)
Patient called advised his bandage has come loose and it allows for water to get through it. Patient asked if he should remove the bandage? The number to contact patient is [phone]

## 2019-08-10 NOTE — Telephone Encounter (Signed)
IC advised per Lurena Joiner ok to remove and pat dry after showers.

## 2019-08-15 ENCOUNTER — Ambulatory Visit: Payer: Self-pay

## 2019-08-15 ENCOUNTER — Other Ambulatory Visit: Payer: Self-pay

## 2019-08-15 ENCOUNTER — Ambulatory Visit (INDEPENDENT_AMBULATORY_CARE_PROVIDER_SITE_OTHER): Payer: BC Managed Care – PPO | Admitting: Orthopedic Surgery

## 2019-08-15 DIAGNOSIS — Z96612 Presence of left artificial shoulder joint: Secondary | ICD-10-CM

## 2019-08-16 ENCOUNTER — Encounter: Payer: Self-pay | Admitting: Orthopedic Surgery

## 2019-08-16 NOTE — Progress Notes (Signed)
Post-Op Visit Note   Patient: Taylor Schroeder           Date of Birth: 28-Jan-1966           MRN: [id number] Visit Date: 08/15/2019 PCP: Janith Lima, MD   Assessment & Plan:  Chief Complaint:  Chief Complaint  Patient presents with  . Left Shoulder - Routine Post Op   Visit Diagnoses:  1. S/P reverse total shoulder arthroplasty, left     Plan: Patient is a 53 year old male who presents s/p left reverse shoulder arthroplasty on 07/31/2019.  Patient states that he is doing well.  He has not had any home health physical therapy.  He has been on the CPM machine and is up to 80 degrees.  He has been compliant with his sling.  On exam his incision is healing well and he has good range of motion with abduction and forward flexion but some stiffness with external rotation.  He is only taking 1 oxycodone per day at night.  He has some weakness of the subscapularis.  Overall patient states that he is more functional since surgery than he was preoperatively.  Patient will discontinue the sling.  Left shoulder x-rays show a well fixed and aligned prosthesis with no complicating features.  Patient will transition to outpatient physical therapy and follow-up with the office in 4 weeks for clinical recheck.  Follow-Up Instructions: No follow-ups on file.   Orders:  Orders Placed This Encounter  Procedures  . XR Shoulder Left  . Ambulatory referral to Physical Therapy   No orders of the defined types were placed in this encounter.   Imaging: No results found.  PMFS History: Patient Active Problem List   Diagnosis Date Noted  . OA (osteoarthritis) of shoulder 07/31/2019  . Allergic rhinoconjunctivitis 01/03/2019  . Seasonal allergic rhinitis due to pollen 04/11/2018  . Vitamin D deficiency 12/21/2017  . 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 Sleep apnea 03/08/2017  . Hyperlipidemia with target LDL less than 100 12/22/2016  . Primary erectile dysfunction 11/18/2014  .  Essential hypertension 07/15/2014  . DDD (degenerative disc disease), cervical 10/03/2013  . Esophageal ring, acquired 07/12/2013  . Crohn's ileitis - working dx 06/01/2013  . BPH (benign prostatic hyperplasia) 04/18/2013  . Pernicious anemia-B12 deficiency 04/18/2013  . Severe obesity (BMI >= 40) ([place]) 12/21/2012  . GERD (gastroesophageal reflux disease) 12/21/2012  . Hyperglycemia 08/29/2012  . Routine general medical examination at a health care facility 08/29/2012   Past Medical History:  Diagnosis Date  . Arthritis    "qwhere" (02/03/2017)  . BPH (benign prostatic hyperplasia)   . Childhood asthma    "as a baby"  . Coronary artery disease Non-obstructive   a. nonobs cath in 2006;  b. 01/2013 Cath: LM nl, LAD 36m, LCX nl, RCA non-dom, nl. EF 60-65%.  . Esophageal ring, acquired 07/12/2013  . GERD (gastroesophageal reflux disease)   . H/O hiatal hernia    "think so" (02/06/2014)  . Headache    "resolved w/BP control in 11/2015" (02/03/2017)  . High cholesterol   . History of kidney stones   . Hypertension   . Ileitis 2014   Suspected Crohn's, think not as time has gone on  . Obesity   . Pernicious anemia-B12 deficiency 04/18/2013  . PONV (postoperative nausea and vomiting) 02/03/2017; 08/2016  . Sleep apnea    cpap every night    Family History  Problem Relation Age of Onset  . Hypertension Mother   .  Cancer Father        type unknown  . COPD Other   . Emphysema Maternal Grandmother   . Emphysema Maternal Grandfather   . Other Paternal Grandmother        spinal cancer  . Alcohol abuse Neg Hx   . Diabetes Neg Hx   . Early death Neg Hx   . Heart disease Neg Hx   . Hyperlipidemia Neg Hx   . Kidney disease Neg Hx   . Stroke Neg Hx   . Colon cancer Neg Hx   . Stomach cancer Neg Hx   . Esophageal cancer Neg Hx   . Rectal cancer Neg Hx   . Liver cancer Neg Hx     Past Surgical History:  Procedure Laterality Date  . APPENDECTOMY    . CARDIAC CATHETERIZATION  01/2013    "results were clear"  . CARPAL TUNNEL RELEASE Right   . CHOLECYSTECTOMY N/A 02/03/2017   Procedure: LAPAROSCOPIC CHOLECYSTECTOMY;  Surgeon: Greer Pickerel, MD;  Location: [facility];  Service: General;  Laterality: N/A;  . COLONOSCOPY  ~ 2014  . COLONOSCOPY    . ESOPHAGOGASTRODUODENOSCOPY  ~ 2014  . INGUINAL HERNIA REPAIR  1966   "? side"  . KNEE ARTHROPLASTY Left 02/06/2014   Procedure: COMPUTER ASSISTED TOTAL KNEE ARTHROPLASTY;  Surgeon: [name], MD;  Location: [facility];  Service: Orthopedics;  Laterality: Left;  Cemented Left Total Knee Arthroplasty  . KNEE ARTHROSCOPY Left 08/2016   scar tissue cleaned out  . LAPAROSCOPIC CHOLECYSTECTOMY  02/03/2017  . LEFT HEART CATHETERIZATION WITH CORONARY ANGIOGRAM N/A 01/29/2013   Procedure: LEFT HEART CATHETERIZATION WITH CORONARY ANGIOGRAM;  Surgeon: Thayer Headings, MD;  Location: [facility] CATH LAB;  Service: Cardiovascular;  Laterality: N/A;  . MULTIPLE TOOTH EXTRACTIONS  09/2016  . REVERSE SHOULDER ARTHROPLASTY Left 07/31/2019   Procedure: LEFT REVERSE SHOULDER ARTHROPLASTY;  Surgeon: Meredith Pel, MD;  Location: [facility];  Service: Orthopedics;  Laterality: Left;  . SHOULDER ARTHROSCOPY Left 03/12/2013  . TOTAL KNEE ARTHROPLASTY Left 02/06/2014  . TOTAL KNEE ARTHROPLASTY Right 09/14/2017  . TOTAL KNEE ARTHROPLASTY Right 09/14/2017   Procedure: RIGHT TOTAL KNEE ARTHROPLASTY;  Surgeon: [name], MD;  Location: [facility];  Service: Orthopedics;  Laterality: Right;   Social History   Occupational History  . Occupation: MAINTENANCE    Employer: [employer]  Tobacco Use  . Smoking status: Former Smoker    Packs/day: 0.00    Years: 30.00    Pack years: 0.00    Quit date: 08/29/2008    Years since quitting: 10.9  . Smokeless tobacco: Never Used  Substance and Sexual Activity  . Alcohol use: No    Comment: 02/03/2017 "maybe 12 pack of beer all at one time but only once/year"; 02/06/2014 "couple beers a couple times/month"  . Drug use: No   . Sexual activity: Yes

## 2019-08-31 ENCOUNTER — Encounter: Payer: Self-pay | Admitting: Physical Therapy

## 2019-08-31 ENCOUNTER — Other Ambulatory Visit: Payer: Self-pay

## 2019-08-31 ENCOUNTER — Ambulatory Visit (INDEPENDENT_AMBULATORY_CARE_PROVIDER_SITE_OTHER): Payer: BC Managed Care – PPO | Admitting: Physical Therapy

## 2019-08-31 DIAGNOSIS — M25512 Pain in left shoulder: Secondary | ICD-10-CM | POA: Diagnosis not present

## 2019-08-31 DIAGNOSIS — M25612 Stiffness of left shoulder, not elsewhere classified: Secondary | ICD-10-CM | POA: Diagnosis not present

## 2019-08-31 DIAGNOSIS — M6281 Muscle weakness (generalized): Secondary | ICD-10-CM | POA: Diagnosis not present

## 2019-08-31 NOTE — Therapy (Signed)
[facility] [place], [place], [address] Phone: [phone]   Fax:  [phone]  Physical Therapy Evaluation  Patient Details  Name: Taylor Schroeder MRN: [id number] Date of Birth: November 01, 1965 Referring Provider (PT): Magnant, Aldean Jewett, Meredith Pel, MD   Encounter Date: 08/31/2019  PT End of Session - 08/31/19 0951    Visit Number  1    Number of Visits  20    Date for PT Re-Evaluation  11/23/19    Authorization Type  [employer]    PT Start Time  0845    PT Stop Time  0930    PT Time Calculation (min)  45 min    Activity Tolerance  Patient tolerated treatment well    Behavior During Therapy  [facility] for tasks assessed/performed       Past Medical History:  Diagnosis Date  . Arthritis    "qwhere" (02/03/2017)  . BPH (benign prostatic hyperplasia)   . Childhood asthma    "as a baby"  . Coronary artery disease Non-obstructive   a. nonobs cath in 2006;  b. 01/2013 Cath: LM nl, LAD 67m, LCX nl, RCA non-dom, nl. EF 60-65%.  . Esophageal ring, acquired 07/12/2013  . GERD (gastroesophageal reflux disease)   . H/O hiatal hernia    "think so" (02/06/2014)  . Headache    "resolved w/BP control in 11/2015" (02/03/2017)  . High cholesterol   . History of kidney stones   . Hypertension   . Ileitis 2014   Suspected Crohn's, think not as time has gone on  . Obesity   . Pernicious anemia-B12 deficiency 04/18/2013  . PONV (postoperative nausea and vomiting) 02/03/2017; 08/2016  . Sleep apnea    cpap every night    Past Surgical History:  Procedure Laterality Date  . APPENDECTOMY    . CARDIAC CATHETERIZATION  01/2013   "results were clear"  . CARPAL TUNNEL RELEASE Right   . CHOLECYSTECTOMY N/A 02/03/2017   Procedure: LAPAROSCOPIC CHOLECYSTECTOMY;  Surgeon: Greer Pickerel, MD;  Location: [facility];  Service: General;  Laterality: N/A;  . COLONOSCOPY  ~ 2014  . COLONOSCOPY    . ESOPHAGOGASTRODUODENOSCOPY  ~ 2014  . INGUINAL HERNIA  REPAIR  1966   "? side"  . KNEE ARTHROPLASTY Left 02/06/2014   Procedure: COMPUTER ASSISTED TOTAL KNEE ARTHROPLASTY;  Surgeon: [name], MD;  Location: [facility];  Service: Orthopedics;  Laterality: Left;  Cemented Left Total Knee Arthroplasty  . KNEE ARTHROSCOPY Left 08/2016   scar tissue cleaned out  . LAPAROSCOPIC CHOLECYSTECTOMY  02/03/2017  . LEFT HEART CATHETERIZATION WITH CORONARY ANGIOGRAM N/A 01/29/2013   Procedure: LEFT HEART CATHETERIZATION WITH CORONARY ANGIOGRAM;  Surgeon: Thayer Headings, MD;  Location: [facility] CATH LAB;  Service: Cardiovascular;  Laterality: N/A;  . MULTIPLE TOOTH EXTRACTIONS  09/2016  . REVERSE SHOULDER ARTHROPLASTY Left 07/31/2019   Procedure: LEFT REVERSE SHOULDER ARTHROPLASTY;  Surgeon: Meredith Pel, MD;  Location: [facility];  Service: Orthopedics;  Laterality: Left;  . SHOULDER ARTHROSCOPY Left 03/12/2013  . TOTAL KNEE ARTHROPLASTY Left 02/06/2014  . TOTAL KNEE ARTHROPLASTY Right 09/14/2017  . TOTAL KNEE ARTHROPLASTY Right 09/14/2017   Procedure: RIGHT TOTAL KNEE ARTHROPLASTY;  Surgeon: [name], MD;  Location: [facility];  Service: Orthopedics;  Laterality: Right;    There were no vitals filed for this visit.   Subjective Assessment - 08/31/19 0939    Subjective  He relays chronic RTC tears that he put off for too long so ended  up needing rTSA as at least on RTC was unrepairable. He had rTSA on Lt shoulder on 07/31/19, relays he was out of the sling after week one, relays he is doing well with minimal pain usually but today has 5/10 pain. He is already performing AROM to tolerance.    Pertinent History  PMH: rTSA Lt 07/31/19, Rt TKA, sleep apnea,HTN, cervical DDD,BPH,obesity    Diagnostic tests  "Left shoulder x-rays show a well fixed and aligned prosthesis with no complicating features."    Patient Stated Goals  do as much as I can with my shoulder, I know it wont be back to normal.    Currently in Pain?  Yes    Pain Score  5     Pain Location  Shoulder     Pain Orientation  Left    Pain Descriptors / Indicators  Aching;Sore    Pain Type  Surgical pain    Pain Radiating Towards  denies N/T in his UE    Pain Onset  More than a month ago    Pain Frequency  Intermittent    Aggravating Factors   doing too much    Pain Relieving Factors  moving his shoulder around         [facility] PT Assessment - 08/31/19 0001      Assessment   Medical Diagnosis  S/P reverse total shoulder arthroplasty, left on 07/31/19    Referring Provider (PT)  Magnant, Aldean Jewett, Meredith Pel, MD    Onset Date/Surgical Date  07/31/19    Hand Dominance  Right    Next MD Visit  09/12/19      Precautions   Precaution Comments  precuations:  begin with isometrics, and AAROM then  AROM week 6. Avoid hyperextension first 12 weeks, avoid combinaiton of add, IR, and ext for 12 weeks, avoid past 50 deg of IR for 6-8 weeks.       Restrictions   Other Position/Activity Restrictions  no lifting more than 15 lbs for life per pt report MD recommended      Balance Screen   Has the patient fallen in the past 6 months  No      [place] residence      Prior Function   Vocation  Full time employment    Vocation Requirements  does HVAC for [place]   Overall Cognitive Status  Within Functional Limits for tasks assessed      Observation/Other Assessments   Observations  incision looks well healed with no signs of infection      Sensation   Light Touch  Appears Intact    Additional Comments  equal Rt arm vs Lt      Posture/Postural Control   Posture Comments  rounded shoulders      ROM / Strength   AROM / PROM / Strength  AROM;Strength;PROM      AROM   AROM Assessment Site  Shoulder    Right/Left Shoulder  Left    Left Shoulder Flexion  80 Degrees    Left Shoulder ABduction  80 Degrees      PROM   PROM Assessment Site  Shoulder    Right/Left Shoulder  Left    Left  Shoulder Flexion  125 Degrees    Left Shoulder ABduction  93 Degrees    Left Shoulder Internal Rotation  50 Degrees  Left Shoulder External Rotation  20 Degrees      Strength   Overall Strength Comments  not formally tested due to post op status, at least 2+/5 MMT Lt shoulder      Flexibility   Soft Tissue Assessment /Muscle Length  --   tightness in pecs, anterior shoulder, upper trap on Lt side     Transfers   Transfers  Independent with all Transfers                Objective measurements completed on examination: See above findings.      [facility] Adult PT Treatment/Exercise - 08/31/19 0001      Exercises   Exercises  Shoulder      Shoulder Exercises: Supine   External Rotation  AAROM;5 reps    External Rotation Limitations  with wand    Flexion  AAROM;5 reps    Flexion Limitations  with wand    Other Supine Exercises  chest press with wand X 10 reps      Shoulder Exercises: Seated   Other Seated Exercises  table slides AAROM abd and ER 5 reps 5 sec holds      Shoulder Exercises: ROM/Strengthening   Pendulum  10 reps CW, CCW      Shoulder Exercises: Isometric Strengthening   Flexion  5X5"    External Rotation  5X5"    Internal Rotation  5X5"    ABduction  5X5"             PT Education - 08/31/19 0950    Education Details  HEP, POC, reviewed precautions and to avoid pushing into pain    Person(s) Educated  Patient    Methods  Explanation;Demonstration;Verbal cues;Handout    Comprehension  Verbalized understanding;Returned demonstration;Need further instruction       PT Short Term Goals - 08/31/19 0959      PT SHORT TERM GOAL #1   Title  independent with initial HEP. (target for STG 4 weeks 09/29/18)    Status  New      PT SHORT TERM GOAL #2   Title  Improve Lt  shoulder PROM to [facility].    Status  New      PT SHORT TERM GOAL #3   Title  Improve overall Lt shoulder strength to 3+ to -4 MMT    Status  New        PT Long Term Goals -  08/31/19 1001      PT LONG TERM GOAL #1   Title  independent with advanced HEP (taget for all LTG 12 weeks 11/22/18)    Status  New      PT LONG TERM GOAL #2   Title  Improve Lt shoulder AROM to [facility].    Status  New      PT LONG TERM GOAL #3   Title  improve strength to overall 4+ to 5- MMT and be able to lift/carry up to 15 lbs to improve funciton.    Status  New      PT LONG TERM GOAL #4   Title  Reduce pain to overall less than 3/10 with ususal activities.    Status  New             Plan - 08/31/19 [id number]    Clinical Impression Statement  Pt presents with S/P reverse total shoulder arthroplasty, left on 07/31/19. Overall he is doing well post op but does have funcitonal deficits in Lt shoulder ROM, strength, soft tissue restrictions  in pec, biceps, upper trap, decreased functional use of his Lt upper extremity and increased pain. He will benefit from skilled PT to address these deficits and progress safely per protocol.    Personal Factors and Comorbidities  Comorbidity 3+    Comorbidities  PMH: rTSA Lt 07/31/19, bilat TKA, sleep apnea,HTN, cervical DDD,BPH,obesity    Examination-Activity Limitations  Reach Overhead;Carry;Dressing;Lift    Examination-Participation Restrictions  Meal Prep;Cleaning;School;Community Activity;Driving;Laundry;Yard Work    Public affairs consultant  Low    Rehab Potential  Good    PT Frequency  2x / week    PT Duration  12 weeks    PT Treatment/Interventions  ADLs/Self Care Home Management;Cryotherapy;Electrical Stimulation;Iontophoresis 4mg /ml Dexamethasone;Moist Heat;Ultrasound;Therapeutic activities;Therapeutic exercise;Neuromuscular re-education;Manual techniques;Scar mobilization;Passive range of motion;Dry needling;Joint Manipulations;Taping    PT Next Visit Plan  review update HEP PRN, isometrics and AAROM for now then begin more AROM exercises week 6 (12/15) needs STM and scar  mobilization    PT Home Exercise Plan  Access Code: [email or web address]    Consulted and Agree with Plan of Care  Patient       Patient will benefit from skilled therapeutic intervention in order to improve the following deficits and impairments:  Decreased activity tolerance, Decreased mobility, Decreased strength, Decreased scar mobility, Decreased range of motion, Postural dysfunction, Impaired flexibility, Increased muscle spasms, Increased fascial restricitons, Pain  Visit Diagnosis: Acute pain of left shoulder  Muscle weakness (generalized)  Stiffness of left shoulder, not elsewhere classified     Problem List Patient Active Problem List   Diagnosis Date Noted  . OA (osteoarthritis) of shoulder 07/31/2019  . Allergic rhinoconjunctivitis 01/03/2019  . Seasonal allergic rhinitis due to pollen 04/11/2018  . Vitamin D deficiency 12/21/2017  . 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 Sleep apnea 03/08/2017  . Hyperlipidemia with target LDL less than 100 12/22/2016  . Primary erectile dysfunction 11/18/2014  . Essential hypertension 07/15/2014  . DDD (degenerative disc disease), cervical 10/03/2013  . Esophageal ring, acquired 07/12/2013  . Crohn's ileitis - working dx 06/01/2013  . BPH (benign prostatic hyperplasia) 04/18/2013  . Pernicious anemia-B12 deficiency 04/18/2013  . Severe obesity (BMI >= 40) ([place]) 12/21/2012  . GERD (gastroesophageal reflux disease) 12/21/2012  . Hyperglycemia 08/29/2012  . Routine general medical examination at a health care facility 08/29/2012    Silvestre Mesi 08/31/2019, 10:05 AM  [facility] Physical Therapy [address] [place], [place], [address] Phone: [phone]   Fax:  [phone]  Name: Taylor Schroeder MRN: [id number] Date of Birth: 09/22/1966

## 2019-08-31 NOTE — Patient Instructions (Signed)
Access Code: [email or web address]  URL: [email or web address]  Date: 08/31/2019  Prepared by: Elsie Ra   Exercises  Circular Shoulder Pendulum with Table Support - 10 reps - 2 sets - 3x daily - 7x weekly  Seated Shoulder Abduction Towel Slide at Table Top with Forearm in Neutral - 10 reps - 5 hold - 2-3x daily - 6x weekly  Seated Shoulder External Rotation PROM on Table - 10 reps - 1-2 sets - 5 hold - 2-3x daily - 6x weekly  Supine Shoulder External Rotation in 45 Degrees Abduction AAROM with Dowel - 10 reps - 1-2 sets - 2x daily - 7x weekly  Supine Shoulder Flexion Extension AAROM with Dowel - 10 reps - 1-2 sets - 2x daily - 7x weekly  Isometric Shoulder Flexion - 10 reps - 1 sets - 5 hold - 2x daily - 7x weekly  Isometric Shoulder External Rotation - 10 reps - 1 sets - 5 hold - 2x daily - 7x weekly  Isometric Shoulder Internal Rotation - 10 reps - 1 sets - 5 hold - 2x daily - 7x weekly  Isometric Shoulder Abduction at Wall - 10 reps - 1 sets - 5 hold - 2x daily - 7x weekly

## 2019-09-07 ENCOUNTER — Other Ambulatory Visit: Payer: Self-pay | Admitting: Internal Medicine

## 2019-09-12 ENCOUNTER — Ambulatory Visit: Payer: BC Managed Care – PPO | Admitting: Orthopedic Surgery

## 2019-09-14 ENCOUNTER — Other Ambulatory Visit: Payer: Self-pay

## 2019-09-14 ENCOUNTER — Ambulatory Visit (INDEPENDENT_AMBULATORY_CARE_PROVIDER_SITE_OTHER): Payer: BC Managed Care – PPO | Admitting: Orthopedic Surgery

## 2019-09-14 ENCOUNTER — Encounter: Payer: Self-pay | Admitting: Orthopedic Surgery

## 2019-09-14 VITALS — Ht 69.5 in | Wt 253.0 lb

## 2019-09-14 DIAGNOSIS — Z96612 Presence of left artificial shoulder joint: Secondary | ICD-10-CM

## 2019-09-14 NOTE — Progress Notes (Signed)
Post-Op Visit Note   Patient: Taylor Schroeder           Date of Birth: 29-May-1966           MRN: [id number] Visit Date: 09/14/2019 PCP: Janith Lima, MD   Assessment & Plan:  Chief Complaint:  Chief Complaint  Patient presents with  . Left Shoulder - Follow-up    07/31/2019 Left RSA   Visit Diagnoses:  1. Status post reverse arthroplasty of left shoulder     Plan: Patient is a 53 year old male who presents s/p left reverse shoulder arthroplasty on 08/10/2019.  Patient notes that he is improving steadily.  His range of motion is increasing and his pain is not as bad as it used to be.  He does note a slight injury on 09/02/2019 where he was locked in his truck and he heard a pop when he tripped and caught himself on his truck.  He had some pain following this event but that has improved and subsided.  He is not taking any medication for pain.  Patient has had 1 session of therapy (the evaluation session) and he is set to start physical therapy on 09/18/2019.  He has excellent range of motion on exam and his incision is healing well.  He can sleep on his left side.  He has no significant strength of the subscapularis muscle as this was not repairable at time of surgery.  Provided work note to keep patient out of work for 2 more months.  He will follow-up in 6 weeks for clinical recheck.  Follow-Up Instructions: No follow-ups on file.   Orders:  No orders of the defined types were placed in this encounter.  No orders of the defined types were placed in this encounter.   Imaging: No results found.  PMFS History: Patient Active Problem List   Diagnosis Date Noted  . OA (osteoarthritis) of shoulder 07/31/2019  . Allergic rhinoconjunctivitis 01/03/2019  . Seasonal allergic rhinitis due to pollen 04/11/2018  . Vitamin D deficiency 12/21/2017  . 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 Sleep apnea 03/08/2017  . Hyperlipidemia with target LDL less than 100 12/22/2016    . Primary erectile dysfunction 11/18/2014  . Essential hypertension 07/15/2014  . DDD (degenerative disc disease), cervical 10/03/2013  . Esophageal ring, acquired 07/12/2013  . Crohn's ileitis - working dx 06/01/2013  . BPH (benign prostatic hyperplasia) 04/18/2013  . Pernicious anemia-B12 deficiency 04/18/2013  . Severe obesity (BMI >= 40) ([place]) 12/21/2012  . GERD (gastroesophageal reflux disease) 12/21/2012  . Hyperglycemia 08/29/2012  . Routine general medical examination at a health care facility 08/29/2012   Past Medical History:  Diagnosis Date  . Arthritis    "qwhere" (02/03/2017)  . BPH (benign prostatic hyperplasia)   . Childhood asthma    "as a baby"  . Coronary artery disease Non-obstructive   a. nonobs cath in 2006;  b. 01/2013 Cath: LM nl, LAD 61m, LCX nl, RCA non-dom, nl. EF 60-65%.  . Esophageal ring, acquired 07/12/2013  . GERD (gastroesophageal reflux disease)   . H/O hiatal hernia    "think so" (02/06/2014)  . Headache    "resolved w/BP control in 11/2015" (02/03/2017)  . High cholesterol   . History of kidney stones   . Hypertension   . Ileitis 2014   Suspected Crohn's, think not as time has gone on  . Obesity   . Pernicious anemia-B12 deficiency 04/18/2013  . PONV (postoperative nausea and vomiting) 02/03/2017; 08/2016  .  Sleep apnea    cpap every night    Family History  Problem Relation Age of Onset  . Hypertension Mother   . Cancer Father        type unknown  . COPD Other   . Emphysema Maternal Grandmother   . Emphysema Maternal Grandfather   . Other Paternal Grandmother        spinal cancer  . Alcohol abuse Neg Hx   . Diabetes Neg Hx   . Early death Neg Hx   . Heart disease Neg Hx   . Hyperlipidemia Neg Hx   . Kidney disease Neg Hx   . Stroke Neg Hx   . Colon cancer Neg Hx   . Stomach cancer Neg Hx   . Esophageal cancer Neg Hx   . Rectal cancer Neg Hx   . Liver cancer Neg Hx     Past Surgical History:  Procedure Laterality Date  .  APPENDECTOMY    . CARDIAC CATHETERIZATION  01/2013   "results were clear"  . CARPAL TUNNEL RELEASE Right   . CHOLECYSTECTOMY N/A 02/03/2017   Procedure: LAPAROSCOPIC CHOLECYSTECTOMY;  Surgeon: Greer Pickerel, MD;  Location: [facility];  Service: General;  Laterality: N/A;  . COLONOSCOPY  ~ 2014  . COLONOSCOPY    . ESOPHAGOGASTRODUODENOSCOPY  ~ 2014  . INGUINAL HERNIA REPAIR  1966   "? side"  . KNEE ARTHROPLASTY Left 02/06/2014   Procedure: COMPUTER ASSISTED TOTAL KNEE ARTHROPLASTY;  Surgeon: [name], MD;  Location: [facility];  Service: Orthopedics;  Laterality: Left;  Cemented Left Total Knee Arthroplasty  . KNEE ARTHROSCOPY Left 08/2016   scar tissue cleaned out  . LAPAROSCOPIC CHOLECYSTECTOMY  02/03/2017  . LEFT HEART CATHETERIZATION WITH CORONARY ANGIOGRAM N/A 01/29/2013   Procedure: LEFT HEART CATHETERIZATION WITH CORONARY ANGIOGRAM;  Surgeon: Thayer Headings, MD;  [facility];  Service: Cardiovascular;  Laterality: N/A;  . MULTIPLE TOOTH EXTRACTIONS  09/2016  . REVERSE SHOULDER ARTHROPLASTY Left 07/31/2019   Procedure: LEFT REVERSE SHOULDER ARTHROPLASTY;  Surgeon: Meredith Pel, MD;  Location: [place] [facility];  Service: Orthopedics;  Laterality: Left;  . SHOULDER ARTHROSCOPY Left 03/12/2013  . TOTAL KNEE ARTHROPLASTY Left 02/06/2014  . TOTAL KNEE ARTHROPLASTY Right 09/14/2017  . TOTAL KNEE ARTHROPLASTY Right 09/14/2017   Procedure: RIGHT TOTAL KNEE ARTHROPLASTY;  Surgeon: [name], MD;  Location: [facility];  Service: Orthopedics;  Laterality: Right;   Social History   Occupational History  . Occupation: MAINTENANCE    Employer: [employer]  Tobacco Use  . Smoking status: Former Smoker    Packs/day: 0.00    Years: 30.00    Pack years: 0.00    Quit date: 08/29/2008    Years since quitting: 11.0  . Smokeless tobacco: Never Used  Substance and Sexual Activity  . Alcohol use: No    Comment: 02/03/2017 "maybe 12 pack of beer all at one time but only once/year"; 02/06/2014  "couple beers a couple times/month"  . Drug use: No  . Sexual activity: Yes

## 2019-09-18 ENCOUNTER — Other Ambulatory Visit: Payer: Self-pay

## 2019-09-18 ENCOUNTER — Ambulatory Visit (INDEPENDENT_AMBULATORY_CARE_PROVIDER_SITE_OTHER): Payer: BC Managed Care – PPO | Admitting: Physical Therapy

## 2019-09-18 DIAGNOSIS — M25512 Pain in left shoulder: Secondary | ICD-10-CM | POA: Diagnosis not present

## 2019-09-18 DIAGNOSIS — M25612 Stiffness of left shoulder, not elsewhere classified: Secondary | ICD-10-CM

## 2019-09-18 DIAGNOSIS — M6281 Muscle weakness (generalized): Secondary | ICD-10-CM | POA: Diagnosis not present

## 2019-09-18 NOTE — Patient Instructions (Signed)
Access Code: [email or web address]  URL: [email or web address]  Date: 09/18/2019  Prepared by: Elsie Ra   Exercises  Standing Shoulder Flexion ROM with Dowel - 10 reps - 1-2 sets - 2x daily - 6x weekly  Standing Shoulder Abduction ROM with Dowel - 10 reps - 1-2 sets - 2x daily - 6x weekly  Shoulder Flexion Wall Walk - 10 reps - 1-2 sets - 2x daily - 6x weekly  Standing Shoulder Abduction Finger Walk at Wall - 10 reps - 1-2 sets - 2x daily - 6x weekly  Standing Bent Over Single Arm Scapular Row with Table Support - 10 reps - 3 sets - 2x daily - 6x weekly  Standing Bicep Curls Supinated with Dumbbells - 10 reps - 1-2 sets - 2x daily - 6x weekly  Sidelying Shoulder External Rotation - 10 reps - 1-2 sets - 2x daily - 6x weekly

## 2019-09-18 NOTE — Therapy (Signed)
[facility] [place], [place], [address] Phone: [phone]   Fax:  [phone]  Physical Therapy Treatment  Patient Details  Name: Taylor Schroeder MRN: [id number] Date of Birth: [birth year] Referring Provider (PT): Magnant, Aldean Jewett, Meredith Pel, MD   Encounter Date: 09/18/2019  PT End of Session - 09/18/19 1106    Visit Number  2    Number of Visits  20    Date for PT Re-Evaluation  11/23/19    Authorization Type  [employer]    PT Start Time  1015    PT Stop Time  1055    PT Time Calculation (min)  40 min    Activity Tolerance  Patient tolerated treatment well    Behavior During Therapy  [facility] for tasks assessed/performed       Past Medical History:  Diagnosis Date  . Arthritis    "qwhere" (02/03/2017)  . BPH (benign prostatic hyperplasia)   . Childhood asthma    "as a baby"  . Coronary artery disease Non-obstructive   a. nonobs cath in 2006;  b. 01/2013 Cath: LM nl, LAD 53m, LCX nl, RCA non-dom, nl. EF 60-65%.  . Esophageal ring, acquired 07/12/2013  . GERD (gastroesophageal reflux disease)   . H/O hiatal hernia    "think so" (02/06/2014)  . Headache    "resolved w/BP control in 11/2015" (02/03/2017)  . High cholesterol   . History of kidney stones   . Hypertension   . Ileitis 2014   Suspected Crohn's, think not as time has gone on  . Obesity   . Pernicious anemia-B12 deficiency 04/18/2013  . PONV (postoperative nausea and vomiting) 02/03/2017; 08/2016  . Sleep apnea    cpap every night    Past Surgical History:  Procedure Laterality Date  . APPENDECTOMY    . CARDIAC CATHETERIZATION  01/2013   "results were clear"  . CARPAL TUNNEL RELEASE Right   . CHOLECYSTECTOMY N/A 02/03/2017   Procedure: LAPAROSCOPIC CHOLECYSTECTOMY;  Surgeon: Greer Pickerel, MD;  Location: [place];  Service: General;  Laterality: N/A;  . COLONOSCOPY  ~ 2014  . COLONOSCOPY    . ESOPHAGOGASTRODUODENOSCOPY  ~ 2014  . INGUINAL HERNIA  REPAIR  1966   "? side"  . KNEE ARTHROPLASTY Left 02/06/2014   Procedure: COMPUTER ASSISTED TOTAL KNEE ARTHROPLASTY;  Surgeon: [name], MD;  Location: [place];  Service: Orthopedics;  Laterality: Left;  Cemented Left Total Knee Arthroplasty  . KNEE ARTHROSCOPY Left 08/2016   scar tissue cleaned out  . LAPAROSCOPIC CHOLECYSTECTOMY  02/03/2017  . LEFT HEART CATHETERIZATION WITH CORONARY ANGIOGRAM N/A 01/29/2013   Procedure: LEFT HEART CATHETERIZATION WITH CORONARY ANGIOGRAM;  Surgeon: Thayer Headings, MD;  [facility] C[facility] LAB;  Service: Cardiovascular;  Laterality: N/A;  . MULTIPLE TOOTH EXTRACTIONS  09/2016  . REVERSE SHOULDER ARTHROPLASTY Left 07/31/2019   Procedure: LEFT REVERSE SHOULDER ARTHROPLASTY;  Surgeon: Meredith Pel, MD;  Location: [place];  Service: Orthopedics;  Laterality: Left;  . SHOULDER ARTHROSCOPY Left 03/12/2013  . TOTAL KNEE ARTHROPLASTY Left 02/06/2014  . TOTAL KNEE ARTHROPLASTY Right 09/14/2017  . TOTAL KNEE ARTHROPLASTY Right 09/14/2017   Procedure: RIGHT TOTAL KNEE ARTHROPLASTY;  Surgeon: [name], MD;  Location: [place];  Service: Orthopedics;  Laterality: Right;    There were no vitals filed for this visit.  Subjective Assessment - 09/18/19 1059    Subjective  relays his shoulder is coming alone, no pain at rest but pain if he reaches  too far. He is able to control pain with home TENS unit    Pertinent History  PMH: rTSA Lt 07/31/19, Rt TKA, sleep apnea,HTN, cervical DDD,BPH,obesity    Diagnostic tests  "Left shoulder x-rays show a well fixed and aligned prosthesis with no complicating features."    Patient Stated Goals  do as much as I can with my shoulder, I know it wont be back to normal.    Pain Onset  More than a month ago                       [facility] Adult PT Treatment/Exercise - 09/18/19 0001      Shoulder Exercises: Sidelying   External Rotation  Left;15 reps    External Rotation Limitations  towel roll under arm      Shoulder  Exercises: Standing   Flexion  AROM;10 reps    ABduction  AROM;10 reps    Row Limitations  bent over row on Lt X 10 reps    Other Standing Exercises  standing dowel 1lb flexion and scaption AAROM X 10 reps ea    Other Standing Exercises  wall ladder X 10 reps for flexion and scaption, bicep curls 5 lb  X10 reps      Shoulder Exercises: Pulleys   Flexion  2 minutes    ABduction  2 minutes      Shoulder Exercises: Therapy Ball   Flexion  Left;10 reps    Flexion Limitations  hold 5 sec    ABduction  Left;10 reps    ABduction Limitations  hold 5 sec      Shoulder Exercises: ROM/Strengthening   UBE (Upper Arm Bike)  2.5 min fwd, 2.5 min retro      Modalities   Modalities  --   pt declined            PT Education - 09/18/19 1106    Education Details  HEP progression    Person(s) Educated  Patient    Methods  Explanation;Demonstration;Verbal cues;Handout    Comprehension  Verbalized understanding;Returned demonstration       PT Short Term Goals - 08/31/19 0959      PT SHORT TERM GOAL #1   Title  independent with initial HEP. (target for STG 4 weeks 09/29/18)    Status  New      PT SHORT TERM GOAL #2   Title  Improve Lt  shoulder PROM to [facility].    Status  New      PT SHORT TERM GOAL #3   Title  Improve overall Lt shoulder strength to 3+ to -4 MMT    Status  New        PT Long Term Goals - 08/31/19 1001      PT LONG TERM GOAL #1   Title  independent with advanced HEP (taget for all LTG 12 weeks 11/22/18)    Status  New      PT LONG TERM GOAL #2   Title  Improve Lt shoulder AROM to [facility].    Status  New      PT LONG TERM GOAL #3   Title  improve strength to overall 4+ to 5- MMT and be able to lift/carry up to 15 lbs to improve funciton.    Status  New      PT LONG TERM GOAL #4   Title  Reduce pain to overall less than 3/10 with ususal activities.    Status  New  Plan - 09/18/19 1106    Clinical Impression Statement  Pt is now 7 weeks post op  so progressed to AROM and light strengthening to begin. He is overall progressing well with strength and ROM and will continue to benefit from PT. His HEP was progressed today and he showed good return demonstation and understanding    Personal Factors and Comorbidities  Comorbidity 3+    Comorbidities  PMH: rTSA Lt 07/31/19, bilat TKA, sleep apnea,HTN, cervical DDD,BPH,obesity    Examination-Activity Limitations  Reach Overhead;Carry;Dressing;Lift    Examination-Participation Restrictions  Meal Prep;Cleaning;School;Community Activity;Driving;Laundry;Yard Work    [employer]  Evolving/Moderate complexity    Rehab Potential  Good    PT Frequency  2x / week    PT Duration  12 weeks    PT Treatment/Interventions  ADLs/Self Care Home Management;Cryotherapy;Electrical Stimulation;Iontophoresis 4mg /ml Dexamethasone;Moist Heat;Ultrasound;Therapeutic activities;Therapeutic exercise;Neuromuscular re-education;Manual techniques;Scar mobilization;Passive range of motion;Dry needling;Joint Manipulations;Taping    PT Next Visit Plan  review update HEP PRN, isometrics and AAROM for now then begin more AROM exercises week 6 (12/15) needs STM and scar mobilization    PT Home Exercise Plan  Access Code: [email or web address]    Consulted and Agree with Plan of Care  Patient       Patient will benefit from skilled therapeutic intervention in order to improve the following deficits and impairments:  Decreased activity tolerance, Decreased mobility, Decreased strength, Decreased scar mobility, Decreased range of motion, Postural dysfunction, Impaired flexibility, Increased muscle spasms, Increased fascial restricitons, Pain  Visit Diagnosis: Acute pain of left shoulder  Muscle weakness (generalized)  Stiffness of left shoulder, not elsewhere classified     Problem List Patient Active Problem List   Diagnosis Date Noted  . OA (osteoarthritis) of shoulder 07/31/2019  . Allergic rhinoconjunctivitis  01/03/2019  . Seasonal allergic rhinitis due to pollen 04/11/2018  . Vitamin D deficiency 12/21/2017  . 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 Sleep apnea 03/08/2017  . Hyperlipidemia with target LDL less than 100 12/22/2016  . Primary erectile dysfunction 11/18/2014  . Essential hypertension 07/15/2014  . DDD (degenerative disc disease), cervical 10/03/2013  . Esophageal ring, acquired 07/12/2013  . Crohn's ileitis - working dx 06/01/2013  . BPH (benign prostatic hyperplasia) 04/18/2013  . Pernicious anemia-B12 deficiency 04/18/2013  . Severe obesity (BMI >= 40) ([place]) 12/21/2012  . GERD (gastroesophageal reflux disease) 12/21/2012  . Hyperglycemia 08/29/2012  . Routine general medical examination at a health care facility 08/29/2012    Silvestre Mesi 09/18/2019, 11:09 AM  [facility] Physical Therapy [address] [place], [place], [address] Phone: [phone]   Fax:  [phone]  Name: RAYLYNN DELLA MRN: [id number] Date of Birth: 12/29/1965

## 2019-09-24 ENCOUNTER — Other Ambulatory Visit: Payer: Self-pay

## 2019-09-24 ENCOUNTER — Ambulatory Visit (INDEPENDENT_AMBULATORY_CARE_PROVIDER_SITE_OTHER): Payer: BC Managed Care – PPO | Admitting: Physical Therapy

## 2019-09-24 DIAGNOSIS — M25512 Pain in left shoulder: Secondary | ICD-10-CM

## 2019-09-24 DIAGNOSIS — M25612 Stiffness of left shoulder, not elsewhere classified: Secondary | ICD-10-CM

## 2019-09-24 DIAGNOSIS — M6281 Muscle weakness (generalized): Secondary | ICD-10-CM

## 2019-09-24 NOTE — Therapy (Signed)
[place], [place], [address] Phone: [phone]   Fax:  [phone]  Physical Therapy Treatment  Patient Details  Name: Taylor Schroeder MRN: [id number] Date of Birth: 1966/02/22 Referring Provider (PT): Magnant, Aldean Jewett, Meredith Pel, MD   Encounter Date: 09/24/2019  PT End of Session - 09/24/19 1155    Visit Number  3    Number of Visits  20    Date for PT Re-Evaluation  11/23/19    Authorization Type  BC[employer]    PT Start Time  1100    PT Stop Time  1140    PT Time Calculation (min)  40 min    Activity Tolerance  Patient tolerated treatment well    Behavior During Therapy  [facility] for tasks assessed/performed       Past Medical History:  Diagnosis Date  . Arthritis    "qwhere" (02/03/2017)  . BPH (benign prostatic hyperplasia)   . Childhood asthma    "as a baby"  . Coronary artery disease Non-obstructive   a. nonobs cath in 2006;  b. 01/2013 Cath: LM nl, LAD 56m LCX nl, RCA non-dom, nl. EF 60-65%.  . Esophageal ring, acquired 07/12/2013  . GERD (gastroesophageal reflux disease)   . H/O hiatal hernia    "think so" (02/06/2014)  . Headache    "resolved w/BP control in 11/2015" (02/03/2017)  . High cholesterol   . History of kidney stones   . Hypertension   . Ileitis 2014   Suspected Crohn's, think not as time has gone on  . Obesity   . Pernicious anemia-B12 deficiency 04/18/2013  . PONV (postoperative nausea and vomiting) 02/03/2017; 08/2016  . Sleep apnea    cpap every night    Past Surgical History:  Procedure Laterality Date  . APPENDECTOMY    . CARDIAC CATHETERIZATION  01/2013   "results were clear"  . CARPAL TUNNEL RELEASE Right   . CHOLECYSTECTOMY N/A 02/03/2017   Procedure: LAPAROSCOPIC CHOLECYSTECTOMY;  Surgeon: WGreer Pickerel MD;  [place]  Service: General;  Laterality: N/A;  . COLONOSCOPY  ~ 2014  . COLONOSCOPY    . ESOPHAGOGASTRODUODENOSCOPY  ~ 2014  . INGUINAL HERNIA  REPAIR  1966   "? side"  . KNEE ARTHROPLASTY Left 02/06/2014   Procedure: COMPUTER ASSISTED TOTAL KNEE ARTHROPLASTY;  Surgeon: MMarybelle Killings MD;  [facility]  Service: Orthopedics;  Laterality: Left;  Cemented Left Total Knee Arthroplasty  . KNEE ARTHROSCOPY Left 08/2016   scar tissue cleaned out  . LAPAROSCOPIC CHOLECYSTECTOMY  02/03/2017  . LEFT HEART CATHETERIZATION WITH CORONARY ANGIOGRAM N/A 01/29/2013   Procedure: LEFT HEART CATHETERIZATION WITH CORONARY ANGIOGRAM;  Surgeon: PThayer Headings MD;  [facility];  Service: Cardiovascular;  Laterality: N/A;  . MULTIPLE TOOTH EXTRACTIONS  09/2016  . REVERSE SHOULDER ARTHROPLASTY Left 07/31/2019   Procedure: LEFT REVERSE SHOULDER ARTHROPLASTY;  Surgeon: DMeredith Pel MD;  [place]  Service: Orthopedics;  Laterality: Left;  . SHOULDER ARTHROSCOPY Left 03/12/2013  . TOTAL KNEE ARTHROPLASTY Left 02/06/2014  . TOTAL KNEE ARTHROPLASTY Right 09/14/2017  . TOTAL KNEE ARTHROPLASTY Right 09/14/2017   Procedure: RIGHT TOTAL KNEE ARTHROPLASTY;  Surgeon: YMarybelle Killings MD;  [place]  Service: Orthopedics;  Laterality: Right;    There were no vitals filed for this visit.  Subjective Assessment - 09/24/19 1118    Subjective  relays shoulder is sore, he had to put in a new eletrical box and this  aggravated his shoulder some. Relays it has eased off some today still about 3/10 pain in Lt shoulder                       [facility] Adult PT Treatment/Exercise - 09/24/19 0001      Shoulder Exercises: Standing   External Rotation  Left;15 reps    Theraband Level (Shoulder External Rotation)  Level 1 (Yellow)    Internal Rotation  Left;15 reps    Theraband Level (Shoulder Internal Rotation)  Level 1 (Yellow)    Flexion  Strengthening;Both;15 reps    Shoulder Flexion Weight (lbs)  2 lb bar    ABduction  AAROM;Left;15 reps    Shoulder ABduction Weight (lbs)  2 lb bar    Row  15 reps    Theraband Level (Shoulder  Row)  Level 1 (Yellow)    Other Standing Exercises  wall push ups X 10 reps, scapular stabilization ball rolls with 2 lb ball at 90 deg for circles X 15 reps CW/CCW    Other Standing Exercises  wall ladder X 10 reps for flexion and scaption, bicep curls 5 lb  X12 reps      Shoulder Exercises: Pulleys   Flexion  2 minutes    ABduction  2 minutes      Shoulder Exercises: ROM/Strengthening   UBE (Upper Arm Bike)  2.5 min fwd, 2.5 min retro      Manual Therapy   Manual therapy comments  Lt shoulder PROM in sitting all planes, STM around incision for anterior shoulder.               PT Short Term Goals - 09/24/19 1158      PT SHORT TERM GOAL #1   Title  independent with initial HEP. (target for STG 4 weeks 09/29/18)    Status  Achieved      PT SHORT TERM GOAL #2   Title  Improve Lt  shoulder PROM to [facility].    Status  Achieved      PT SHORT TERM GOAL #3   Title  Improve overall Lt shoulder strength to 3+ to -4 MMT    Status  Achieved        PT Long Term Goals - 09/24/19 1159      PT LONG TERM GOAL #1   Title  independent with advanced HEP (taget for all LTG 12 weeks 11/22/18)    Status  On-going      PT LONG TERM GOAL #2   Title  Improve Lt shoulder AROM to [facility].    Status  On-going      PT LONG TERM GOAL #3   Title  improve strength to overall 4+ to 5- MMT and be able to lift/carry up to 15 lbs to improve funciton.    Status  On-going      PT LONG TERM GOAL #4   Title  Reduce pain to overall less than 3/10 with ususal activities.    Status  On-going            Plan - 09/24/19 1155    Clinical Impression Statement  Able to tolerate light strength progression today. He is improving in ROM and strength but still with deficits. PT will continue to progress as able. He has met his short term goals    Personal Factors and Comorbidities  Comorbidity 3+    Comorbidities  PMH: rTSA Lt 07/31/19, bilat TKA, sleep apnea,HTN, cervical DDD,BPH,obesity  Examination-Activity Limitations  Reach Overhead;Carry;Dressing;Lift    Examination-Participation Restrictions  Meal Prep;Cleaning;School;Community Activity;Driving;Laundry;Yard Work    Merchant [employer] officer  Evolving/Moderate complexity    Rehab Potential  Good    PT Frequency  2x / week    PT Duration  12 weeks    PT Treatment/Interventions  ADLs/Self Care Home Management;Cryotherapy;Electrical Stimulation;Iontophoresis 18m/ml Dexamethasone;Moist Heat;Ultrasound;Therapeutic activities;Therapeutic exercise;Neuromuscular re-education;Manual techniques;Scar mobilization;Passive range of motion;Dry needling;Joint Manipulations;Taping    PT Next Visit Plan  progress strength as able, needs STM and scar mobilization    PT Home Exercise Plan  Access Code: [email or web address]   Consulted and Agree with Plan of Care  Patient       Patient will benefit from skilled therapeutic intervention in order to improve the following deficits and impairments:  Decreased activity tolerance, Decreased mobility, Decreased strength, Decreased scar mobility, Decreased range of motion, Postural dysfunction, Impaired flexibility, Increased muscle spasms, Increased fascial restricitons, Pain  Visit Diagnosis: Acute pain of left shoulder  Muscle weakness (generalized)  Stiffness of left shoulder, not elsewhere classified     Problem List Patient Active Problem List   Diagnosis Date Noted  . OA (osteoarthritis) of shoulder 07/31/2019  . Allergic rhinoconjunctivitis 01/03/2019  . Seasonal allergic rhinitis due to pollen 04/11/2018  . Vitamin D deficiency 12/21/2017  . 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 Sleep apnea 03/08/2017  . Hyperlipidemia with target LDL less than 100 12/22/2016  . Primary erectile dysfunction 11/18/2014  . Essential hypertension 07/15/2014  . DDD (degenerative disc disease), cervical 10/03/2013  . Esophageal ring, acquired 07/12/2013  . Crohn's ileitis - working  dx 06/01/2013  . BPH (benign prostatic hyperplasia) 04/18/2013  . Pernicious anemia-B12 deficiency 04/18/2013  . Severe obesity (BMI >= 40) ([place] 12/21/2012  . GERD (gastroesophageal reflux disease) 12/21/2012  . Hyperglycemia 08/29/2012  . Routine general medical examination at a health care facility 08/29/2012    BDebbe Odea,PT,DPT 09/24/2019, 12:01 PM  [facility] [address] [address]Phone: [phone]  Fax:  [phone] Name: CJAVANNI MARINGMRN: [id number] of Birth: [birth year]

## 2019-09-26 ENCOUNTER — Encounter: Payer: BC Managed Care – PPO | Admitting: Physical Therapy

## 2019-10-01 ENCOUNTER — Ambulatory Visit (INDEPENDENT_AMBULATORY_CARE_PROVIDER_SITE_OTHER): Payer: BC Managed Care – PPO | Admitting: Physical Therapy

## 2019-10-01 ENCOUNTER — Other Ambulatory Visit: Payer: Self-pay

## 2019-10-01 ENCOUNTER — Encounter: Payer: Self-pay | Admitting: Physical Therapy

## 2019-10-01 DIAGNOSIS — M25612 Stiffness of left shoulder, not elsewhere classified: Secondary | ICD-10-CM | POA: Diagnosis not present

## 2019-10-01 DIAGNOSIS — M6281 Muscle weakness (generalized): Secondary | ICD-10-CM

## 2019-10-01 DIAGNOSIS — M25512 Pain in left shoulder: Secondary | ICD-10-CM | POA: Diagnosis not present

## 2019-10-01 NOTE — Therapy (Signed)
[place], [place], [address] Phone: [phone]   Fax:  [phone]  Physical Therapy Treatment  Patient Details  Name: Taylor Schroeder MRN: [id number] Date of Birth: October 29, 1965 Referring Provider (PT): Magnant, Aldean Jewett, Meredith Pel, MD   Encounter Date: 10/01/2019  PT End of Session - 10/01/19 1004    Visit Number  4    Number of Visits  20    Date for PT Re-Evaluation  11/23/19    Authorization Type  BCBS    PT Start Time  0924    PT Stop Time  1004    PT Time Calculation (min)  40 min    Activity Tolerance  Patient tolerated treatment well    Behavior During Therapy  [facility] for tasks assessed/performed       Past Medical History:  Diagnosis Date  . Arthritis    "qwhere" (02/03/2017)  . BPH (benign prostatic hyperplasia)   . Childhood asthma    "as a baby"  . Coronary artery disease Non-obstructive   a. nonobs cath in 2006;  b. 01/2013 Cath: LM nl, LAD 57m, LCX nl, RCA non-dom, nl. EF 60-65%.  . Esophageal ring, acquired 07/12/2013  . GERD (gastroesophageal reflux disease)   . H/O hiatal hernia    "think so" (02/06/2014)  . Headache    "resolved w/BP control in 11/2015" (02/03/2017)  . High cholesterol   . History of kidney stones   . Hypertension   . Ileitis 2014   Suspected Crohn's, think not as time has gone on  . Obesity   . Pernicious anemia-B12 deficiency 04/18/2013  . PONV (postoperative nausea and vomiting) 02/03/2017; 08/2016  . Sleep apnea    cpap every night    Past Surgical History:  Procedure Laterality Date  . APPENDECTOMY    . CARDIAC CATHETERIZATION  01/2013   "results were clear"  . CARPAL TUNNEL RELEASE Right   . CHOLECYSTECTOMY N/A 02/03/2017   Procedure: LAPAROSCOPIC CHOLECYSTECTOMY;  Surgeon: Greer Pickerel, MD;  Location: [place];  Service: General;  Laterality: N/A;  . COLONOSCOPY  ~ 2014  . COLONOSCOPY    . ESOPHAGOGASTRODUODENOSCOPY  ~ 2014  . INGUINAL HERNIA REPAIR   1966   "? side"  . KNEE ARTHROPLASTY Left 02/06/2014   Procedure: COMPUTER ASSISTED TOTAL KNEE ARTHROPLASTY;  Surgeon: [name], MD;  Location: [place];  Service: Orthopedics;  Laterality: Left;  Cemented Left Total Knee Arthroplasty  . KNEE ARTHROSCOPY Left 08/2016   scar tissue cleaned out  . LAPAROSCOPIC CHOLECYSTECTOMY  02/03/2017  . LEFT HEART CATHETERIZATION WITH CORONARY ANGIOGRAM N/A 01/29/2013   Procedure: LEFT HEART CATHETERIZATION WITH CORONARY ANGIOGRAM;  Surgeon: Thayer Headings, MD;  Location[facility] C[facility] LAB;  Service: Cardiovascular;  Laterality: N/A;  . MULTIPLE TOOTH EXTRACTIONS  09/2016  . REVERSE SHOULDER ARTHROPLASTY Left 07/31/2019   Procedure: LEFT REVERSE SHOULDER ARTHROPLASTY;  Surgeon: Meredith Pel, MD;  Location: [place];  Service: Orthopedics;  Laterality: Left;  . SHOULDER ARTHROSCOPY Left 03/12/2013  . TOTAL KNEE ARTHROPLASTY Left 02/06/2014  . TOTAL KNEE ARTHROPLASTY Right 09/14/2017  . TOTAL KNEE ARTHROPLASTY Right 09/14/2017   Procedure: RIGHT TOTAL KNEE ARTHROPLASTY;  Surgeon: [name], MD;  Location: [place];  Service: Orthopedics;  Laterality: Right;    There were no vitals filed for this visit.  Subjective Assessment - 10/01/19 0924    Subjective  doing well; shoulder is still sore. "I don't like this weather."    Patient  Stated Goals  do as much as I can with my shoulder, I know it wont be back to normal.    Currently in Pain?  No/denies                       [facility] Adult PT Treatment/Exercise - 10/01/19 0927      Shoulder Exercises: Standing   External Rotation  Left;15 reps    Theraband Level (Shoulder External Rotation)  Level 1 (Yellow)    Internal Rotation  Left;15 reps    Theraband Level (Shoulder Internal Rotation)  Level 3 (Green)    Flexion  Left;5 reps    Shoulder Flexion Weight (lbs)  1    Flexion Limitations  x30 sec static hold    Row  15 reps    Theraband Level (Shoulder Row)  Level 3 (Green)      Shoulder  Exercises: Pulleys   Flexion  2 minutes    ABduction  2 minutes      Shoulder Exercises: ROM/Strengthening   UBE (Upper Arm Bike)  2.5 min fwd, 2.5 min retro; L2.5    Wall Pushups  10 reps    counter height   Other ROM/Strengthening Exercises  bent over tricep press 2# x 10 (Lt); Lt bicep curl 2x10 5# KB      Manual Therapy   Manual therapy comments  Lt shoulder PROM in sitting all planes, STM around incision for anterior shoulder.               PT Short Term Goals - 09/24/19 1158      PT SHORT TERM GOAL #1   Title  independent with initial HEP. (target for STG 4 weeks 09/29/18)    Status  Achieved      PT SHORT TERM GOAL #2   Title  Improve Lt  shoulder PROM to [facility].    Status  Achieved      PT SHORT TERM GOAL #3   Title  Improve overall Lt shoulder strength to 3+ to -4 MMT    Status  Achieved        PT Long Term Goals - 10/01/19 1004      PT LONG TERM GOAL #1   Title  independent with advanced HEP (taget for all LTG 12 weeks 11/22/18)    Status  On-going      PT LONG TERM GOAL #2   Title  Improve Lt shoulder AROM to [facility].    Status  On-going      PT LONG TERM GOAL #3   Title  improve strength to overall 4+ to 5- MMT and be able to lift/carry up to 15 lbs to improve funciton.    Status  On-going      PT LONG TERM GOAL #4   Title  Reduce pain to overall less than 3/10 with ususal activities.    Status  On-going            Plan - 10/01/19 1005    Clinical Impression Statement  Pt tolerated session well today with strengthening exercises.  Will continue to benefit from PT to maximize function.    Personal Factors and Comorbidities  Comorbidity 3+    Comorbidities  PMH: rTSA Lt 07/31/19, bilat TKA, sleep apnea,HTN, cervical DDD,BPH,obesity    Examination-Activity Limitations  Reach Overhead;Carry;Dressing;Lift    Examination-Participation Restrictions  Meal Prep;Cleaning;School;Community Activity;Driving;Laundry;Yard Work    Multimedia programmer  Evolving/Moderate complexity    Rehab Potential  Good  PT Frequency  2x / week    PT Duration  12 weeks    PT Treatment/Interventions  ADLs/Self Care Home Management;Cryotherapy;Electrical Stimulation;Iontophoresis 4mg /ml Dexamethasone;Moist Heat;Ultrasound;Therapeutic activities;Therapeutic exercise;Neuromuscular re-education;Manual techniques;Scar mobilization;Passive range of motion;Dry needling;Joint Manipulations;Taping    PT Next Visit Plan  progress strength as able, needs STM and scar mobilization    PT Home Exercise Plan  Access Code: [email or web address]    Consulted and Agree with Plan of Care  Patient       Patient will benefit from skilled therapeutic intervention in order to improve the following deficits and impairments:  Decreased activity tolerance, Decreased mobility, Decreased strength, Decreased scar mobility, Decreased range of motion, Postural dysfunction, Impaired flexibility, Increased muscle spasms, Increased fascial restricitons, Pain  Visit Diagnosis: Acute pain of left shoulder  Muscle weakness (generalized)  Stiffness of left shoulder, not elsewhere classified     Problem List Patient Active Problem List   Diagnosis Date Noted  . OA (osteoarthritis) of shoulder 07/31/2019  . Allergic rhinoconjunctivitis 01/03/2019  . Seasonal allergic rhinitis due to pollen 04/11/2018  . Vitamin D deficiency 12/21/2017  . 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 Sleep apnea 03/08/2017  . Hyperlipidemia with target LDL less than 100 12/22/2016  . Primary erectile dysfunction 11/18/2014  . Essential hypertension 07/15/2014  . DDD (degenerative disc disease), cervical 10/03/2013  . Esophageal ring, acquired 07/12/2013  . Crohn's ileitis - working dx 06/01/2013  . BPH (benign prostatic hyperplasia) 04/18/2013  . Pernicious anemia-B12 deficiency 04/18/2013  . Severe obesity (BMI >= 40) ([place]) 12/21/2012  . GERD (gastroesophageal reflux disease) 12/21/2012  .  Hyperglycemia 08/29/2012  . Routine general medical examination at a health care facility 08/29/2012     Laureen Abrahams, PT, DPT 10/01/19 10:09 AM   [facility] Physical Therapy [address] [place], [place], [address] Phone: [phone]   Fax:  [phone]  Name: YACOV TAPLEY MRN: [id number] Date of Birth: 1966-09-09

## 2019-10-03 ENCOUNTER — Other Ambulatory Visit: Payer: Self-pay

## 2019-10-03 ENCOUNTER — Ambulatory Visit (INDEPENDENT_AMBULATORY_CARE_PROVIDER_SITE_OTHER): Payer: BC Managed Care – PPO | Admitting: Physical Therapy

## 2019-10-03 ENCOUNTER — Encounter: Payer: Self-pay | Admitting: Physical Therapy

## 2019-10-03 DIAGNOSIS — M25512 Pain in left shoulder: Secondary | ICD-10-CM | POA: Diagnosis not present

## 2019-10-03 DIAGNOSIS — M25612 Stiffness of left shoulder, not elsewhere classified: Secondary | ICD-10-CM | POA: Diagnosis not present

## 2019-10-03 DIAGNOSIS — M6281 Muscle weakness (generalized): Secondary | ICD-10-CM | POA: Diagnosis not present

## 2019-10-03 NOTE — Therapy (Signed)
[place], [place], [address] Phone: [phone]   Fax:  [phone]  Physical Therapy Treatment  Patient Details  Name: Taylor Schroeder MRN: [id number] Date of Birth: 1966-04-18 Referring Provider (PT): Magnant, Aldean Jewett, Meredith Pel, MD   Encounter Date: 10/03/2019  PT End of Session - 10/03/19 1005    Visit Number  5    Number of Visits  20    Date for PT Re-Evaluation  11/23/19    Authorization Type  BC[employer]    PT Start Time  0927    PT Stop Time  1005    PT Time Calculation (min)  38 min    Activity Tolerance  Patient tolerated treatment well    Behavior During Therapy  [facility] for tasks assessed/performed       Past Medical History:  Diagnosis Date  . Arthritis    "qwhere" (02/03/2017)  . BPH (benign prostatic hyperplasia)   . Childhood asthma    "as a baby"  . Coronary artery disease Non-obstructive   a. nonobs cath in 2006;  b. 01/2013 Cath: LM nl, LAD 16m, LCX nl, RCA non-dom, nl. EF 60-65%.  . Esophageal ring, acquired 07/12/2013  . GERD (gastroesophageal reflux disease)   . H/O hiatal hernia    "think so" (02/06/2014)  . Headache    "resolved w/BP control in 11/2015" (02/03/2017)  . High cholesterol   . History of kidney stones   . Hypertension   . Ileitis 2014   Suspected Crohn's, think not as time has gone on  . Obesity   . Pernicious anemia-B12 deficiency 04/18/2013  . PONV (postoperative nausea and vomiting) 02/03/2017; 08/2016  . Sleep apnea    cpap every night    Past Surgical History:  Procedure Laterality Date  . APPENDECTOMY    . CARDIAC CATHETERIZATION  01/2013   "results were clear"  . CARPAL TUNNEL RELEASE Right   . CHOLECYSTECTOMY N/A 02/03/2017   Procedure: LAPAROSCOPIC CHOLECYSTECTOMY;  Surgeon: Greer Pickerel, MD;  Location: [place];  Service: General;  Laterality: N/A;  . COLONOSCOPY  ~ 2014  . COLONOSCOPY    . ESOPHAGOGASTRODUODENOSCOPY  ~ 2014  . INGUINAL HERNIA REPAIR   1966   "? side"  . KNEE ARTHROPLASTY Left 02/06/2014   Procedure: COMPUTER ASSISTED TOTAL KNEE ARTHROPLASTY;  Surgeon: [name], MD;  Location: [place];  Service: Orthopedics;  Laterality: Left;  Cemented Left Total Knee Arthroplasty  . KNEE ARTHROSCOPY Left 08/2016   scar tissue cleaned out  . LAPAROSCOPIC CHOLECYSTECTOMY  02/03/2017  . LEFT HEART CATHETERIZATION WITH CORONARY ANGIOGRAM N/A 01/29/2013   Procedure: LEFT HEART CATHETERIZATION WITH CORONARY ANGIOGRAM;  Surgeon: Thayer Headings, MD;  Location: [facility] CATH LAB;  Service: Cardiovascular;  Laterality: N/A;  . MULTIPLE TOOTH EXTRACTIONS  09/2016  . REVERSE SHOULDER ARTHROPLASTY Left 07/31/2019   Procedure: LEFT REVERSE SHOULDER ARTHROPLASTY;  Surgeon: Meredith Pel, MD;  Location: [place];  Service: Orthopedics;  Laterality: Left;  . SHOULDER ARTHROSCOPY Left 03/12/2013  . TOTAL KNEE ARTHROPLASTY Left 02/06/2014  . TOTAL KNEE ARTHROPLASTY Right 09/14/2017  . TOTAL KNEE ARTHROPLASTY Right 09/14/2017   Procedure: RIGHT TOTAL KNEE ARTHROPLASTY;  Surgeon: [name], MD;  Location: [place];  Service: Orthopedics;  Laterality: Right;    There were no vitals filed for this visit.  Subjective Assessment - 10/03/19 0927    Subjective  "I've got so much going on right now."    Patient Stated Goals  do as much as I can with my shoulder, I know it wont be back to normal.    Currently in Pain?  No/denies                       [facility] Adult PT Treatment/Exercise - 10/03/19 0930      Shoulder Exercises: Supine   Flexion  Left;15 reps;Weights    Shoulder Flexion Weight (lbs)  1.5      Shoulder Exercises: Prone   Retraction  Left;15 reps;Weights    Retraction Weight (lbs)  3    Extension  Left;15 reps;Weights    Extension Weight (lbs)  1.5    Horizontal ABduction 1  Left;15 reps;Weights    Horizontal ABduction 1 Weight (lbs)  1.5      Shoulder Exercises: Sidelying   External Rotation  Left;15 reps    External Rotation  Limitations  limited range; 3 sec hold    ABduction  Left;15 reps;Weights    ABduction Weight (lbs)  1.5      Shoulder Exercises: Standing   Other Standing Exercises  wall ladder X 10 reps for flexion and scaption      Shoulder Exercises: Pulleys   Flexion  2 minutes    ABduction  2 minutes      Shoulder Exercises: ROM/Strengthening   UBE (Upper Arm Bike)  2.5 min fwd, 2.5 min retro; L2.5    Ranger  flexion, circles x 15 reps bil      Manual Therapy   Manual therapy comments  Lt shoulder PROM in supine all planes, STM around incision for anterior shoulder.               PT Short Term Goals - 09/24/19 1158      PT SHORT TERM GOAL #1   Title  independent with initial HEP. (target for STG 4 weeks 09/29/18)    Status  Achieved      PT SHORT TERM GOAL #2   Title  Improve Lt  shoulder PROM to [facility].    Status  Achieved      PT SHORT TERM GOAL #3   Title  Improve overall Lt shoulder strength to 3+ to -4 MMT    Status  Achieved        PT Long Term Goals - 10/01/19 1004      PT LONG TERM GOAL #1   Title  independent with advanced HEP (taget for all LTG 12 weeks 11/22/18)    Status  On-going      PT LONG TERM GOAL #2   Title  Improve Lt shoulder AROM to [facility].    Status  On-going      PT LONG TERM GOAL #3   Title  improve strength to overall 4+ to 5- MMT and be able to lift/carry up to 15 lbs to improve funciton.    Status  On-going      PT LONG TERM GOAL #4   Title  Reduce pain to overall less than 3/10 with ususal activities.    Status  On-going            Plan - 10/03/19 1005    Clinical Impression Statement  Pt with increased muscle fatigue with strengthening exercises today.  Overall progressing well with PT.    Personal Factors and Comorbidities  Comorbidity 3+    Comorbidities  PMH: rTSA Lt 07/31/19, bilat TKA, sleep apnea,HTN, cervical DDD,BPH,obesity    Examination-Activity Limitations  Reach Overhead;Carry;Dressing;Lift  Examination-Participation  Restrictions  Meal Prep;Cleaning;School;Community Activity;Driving;Laundry;Yard Work    [employer]  Evolving/Moderate complexity    Rehab Potential  Good    PT Frequency  2x / week    PT Duration  12 weeks    PT Treatment/Interventions  ADLs/Self Care Home Management;Cryotherapy;Electrical Stimulation;Iontophoresis 4mg /ml Dexamethasone;Moist Heat;Ultrasound;Therapeutic activities;Therapeutic exercise;Neuromuscular re-education;Manual techniques;Scar mobilization;Passive range of motion;Dry needling;Joint Manipulations;Taping    PT Next Visit Plan  progress strength as able, needs STM and scar mobilization    PT Home Exercise Plan  Access Code: [email or web address]    Consulted and Agree with Plan of Care  Patient       Patient will benefit from skilled therapeutic intervention in order to improve the following deficits and impairments:  Decreased activity tolerance, Decreased mobility, Decreased strength, Decreased scar mobility, Decreased range of motion, Postural dysfunction, Impaired flexibility, Increased muscle spasms, Increased fascial restricitons, Pain  Visit Diagnosis: Acute pain of left shoulder  Muscle weakness (generalized)  Stiffness of left shoulder, not elsewhere classified     Problem List Patient Active Problem List   Diagnosis Date Noted  . OA (osteoarthritis) of shoulder 07/31/2019  . Allergic rhinoconjunctivitis 01/03/2019  . Seasonal allergic rhinitis due to pollen 04/11/2018  . Vitamin D deficiency 12/21/2017  . 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 Sleep apnea 03/08/2017  . Hyperlipidemia with target LDL less than 100 12/22/2016  . Primary erectile dysfunction 11/18/2014  . Essential hypertension 07/15/2014  . DDD (degenerative disc disease), cervical 10/03/2013  . Esophageal ring, acquired 07/12/2013  . Crohn's ileitis - working dx 06/01/2013  . BPH (benign prostatic hyperplasia) 04/18/2013  . Pernicious anemia-B12 deficiency  04/18/2013  . Severe obesity (BMI >= 40) ([place]) 12/21/2012  . GERD (gastroesophageal reflux disease) 12/21/2012  . Hyperglycemia 08/29/2012  . Routine general medical examination at a health care facility 08/29/2012      Laureen Abrahams, PT, DPT 10/03/19 10:07 AM     [facility] Physical Therapy [address] [place], [place], [address] Phone: [phone]   Fax:  [phone]  Name: Taylor Schroeder MRN: [id number] Date of Birth: Oct 17, 1965

## 2019-10-19 ENCOUNTER — Ambulatory Visit (INDEPENDENT_AMBULATORY_CARE_PROVIDER_SITE_OTHER): Payer: BC Managed Care – PPO | Admitting: Physical Therapy

## 2019-10-19 ENCOUNTER — Encounter: Payer: Self-pay | Admitting: Physical Therapy

## 2019-10-19 ENCOUNTER — Other Ambulatory Visit: Payer: Self-pay

## 2019-10-19 DIAGNOSIS — M25612 Stiffness of left shoulder, not elsewhere classified: Secondary | ICD-10-CM

## 2019-10-19 DIAGNOSIS — M6281 Muscle weakness (generalized): Secondary | ICD-10-CM

## 2019-10-19 DIAGNOSIS — M25512 Pain in left shoulder: Secondary | ICD-10-CM

## 2019-10-19 NOTE — Therapy (Signed)
[facility] Physical Therapy [address] [place], [place], [address] Phone: [phone]   Fax:  [phone]  Patient Details  Name: Taylor Schroeder MRN: [id number] Date of Birth: 1966/07/10 Referring Provider:  Meredith Pel, MD  Encounter Date: 10/19/2019    Subjective Assessment - 10/19/19 1103    Subjective  c/o sinus pain and congestion today, not feeling well and requesting short session.    Patient Stated Goals  do as much as I can with my shoulder, I know it wont be back to normal.      Pt arrived to PT today c/o increased congestion and overall not feeling well.  Recommended he cx appt today due to symptoms.  Also said he was doing push ups at counter top earlier this week and with one of the reps noticed a sharper pain in shoulder.  He stopped exercise, and shoulder has been stiff and a little more painful, but overall feels it is improving from earlier in the week.    Will plan to see next week if symptoms improve.    Laureen Abrahams, PT, DPT 10/19/19 11:08 AM     [facility] Physical Therapy [address] [place], [place], [address] Phone: [phone]   Fax:  [phone]

## 2019-10-22 ENCOUNTER — Ambulatory Visit (INDEPENDENT_AMBULATORY_CARE_PROVIDER_SITE_OTHER): Payer: BC Managed Care – PPO | Admitting: Physical Therapy

## 2019-10-22 ENCOUNTER — Other Ambulatory Visit: Payer: Self-pay

## 2019-10-22 ENCOUNTER — Encounter: Payer: Self-pay | Admitting: Physical Therapy

## 2019-10-22 DIAGNOSIS — M6281 Muscle weakness (generalized): Secondary | ICD-10-CM

## 2019-10-22 DIAGNOSIS — M25512 Pain in left shoulder: Secondary | ICD-10-CM | POA: Diagnosis not present

## 2019-10-22 DIAGNOSIS — M25612 Stiffness of left shoulder, not elsewhere classified: Secondary | ICD-10-CM | POA: Diagnosis not present

## 2019-10-22 NOTE — Therapy (Signed)
[place], [place], [address] Phone: [phone]   Fax:  [phone]  Physical Therapy Treatment  Patient Details  Name: Taylor Schroeder MRN: [id number] Date of Birth: 1966-05-15 Referring Provider (PT): Magnant, Aldean Jewett, Meredith Pel, MD   Encounter Date: 10/22/2019  PT End of Session - 10/22/19 0924    Visit Number  6    Number of Visits  20    Date for PT Re-Evaluation  11/23/19    Authorization Type  [employer]    PT Start Time  0846    PT Stop Time  0924    PT Time Calculation (min)  38 min    Activity Tolerance  Patient tolerated treatment well    Behavior During Therapy  [facility] for tasks assessed/performed       Past Medical History:  Diagnosis Date  . Arthritis    "qwhere" (02/03/2017)  . BPH (benign prostatic hyperplasia)   . Childhood asthma    "as a baby"  . Coronary artery disease Non-obstructive   a. nonobs cath in 2006;  b. 01/2013 Cath: LM nl, LAD 23m, LCX nl, RCA non-dom, nl. EF 60-65%.  . Esophageal ring, acquired 07/12/2013  . GERD (gastroesophageal reflux disease)   . H/O hiatal hernia    "think so" (02/06/2014)  . Headache    "resolved w/BP control in 11/2015" (02/03/2017)  . High cholesterol   . History of kidney stones   . Hypertension   . Ileitis 2014   Suspected Crohn's, think not as time has gone on  . Obesity   . Pernicious anemia-B12 deficiency 04/18/2013  . PONV (postoperative nausea and vomiting) 02/03/2017; 08/2016  . Sleep apnea    cpap every night    Past Surgical History:  Procedure Laterality Date  . APPENDECTOMY    . CARDIAC CATHETERIZATION  01/2013   "results were clear"  . CARPAL TUNNEL RELEASE Right   . CHOLECYSTECTOMY N/A 02/03/2017   Procedure: LAPAROSCOPIC CHOLECYSTECTOMY;  Surgeon: Greer Pickerel, MD;  Location: [place];  Service: General;  Laterality: N/A;  . COLONOSCOPY  ~ 2014  . COLONOSCOPY    . ESOPHAGOGASTRODUODENOSCOPY  ~ 2014  . INGUINAL HERNIA REPAIR   1966   "? side"  . KNEE ARTHROPLASTY Left 02/06/2014   Procedure: COMPUTER ASSISTED TOTAL KNEE ARTHROPLASTY;  Surgeon: [name], MD;  Location: [place];  Service: Orthopedics;  Laterality: Left;  Cemented Left Total Knee Arthroplasty  . KNEE ARTHROSCOPY Left 08/2016   scar tissue cleaned out  . LAPAROSCOPIC CHOLECYSTECTOMY  02/03/2017  . LEFT HEART CATHETERIZATION WITH CORONARY ANGIOGRAM N/A 01/29/2013   Procedure: LEFT HEART CATHETERIZATION WITH CORONARY ANGIOGRAM;  Surgeon: Thayer Headings, MD;  Location: [facility] CATH LAB;  Service: Cardiovascular;  Laterality: N/A;  . MULTIPLE TOOTH EXTRACTIONS  09/2016  . REVERSE SHOULDER ARTHROPLASTY Left 07/31/2019   Procedure: LEFT REVERSE SHOULDER ARTHROPLASTY;  Surgeon: Meredith Pel, MD;  Location: [place];  Service: Orthopedics;  Laterality: Left;  . SHOULDER ARTHROSCOPY Left 03/12/2013  . TOTAL KNEE ARTHROPLASTY Left 02/06/2014  . TOTAL KNEE ARTHROPLASTY Right 09/14/2017  . TOTAL KNEE ARTHROPLASTY Right 09/14/2017   Procedure: RIGHT TOTAL KNEE ARTHROPLASTY;  Surgeon: [name], MD;  Location: [place];  Service: Orthopedics;  Laterality: Right;    There were no vitals filed for this visit.  Subjective Assessment - 10/22/19 0848    Subjective  doing well today, brought home TENS unit    Patient Stated Goals  do  as much as I can with my shoulder, I know it wont be back to normal.    Currently in Pain?  No/denies                       [facility] Adult PT Treatment/Exercise - 10/22/19 0848      Self-Care   Self-Care  Other Self-Care Comments    Other Self-Care Comments   educated pt on home TENS unit and positioning and how to use at home.  Pt verbalized understanding      Shoulder Exercises: Supine   Flexion  Left;Weights;10 reps    Shoulder Flexion Weight (lbs)  2      Shoulder Exercises: Standing   Flexion  Left;15 reps    Shoulder Flexion Weight (lbs)  1    ABduction  Left;15 reps;Weights    Shoulder ABduction Weight (lbs)   1    Extension  15 reps;Theraband    Theraband Level (Shoulder Extension)  Level 4 (Blue)    Row  15 reps    Theraband Level (Shoulder Row)  Level 4 (Blue)      Shoulder Exercises: ROM/Strengthening   UBE (Upper Arm Bike)  2.5 min fwd, 2.5 min retro; L2.5    Ranger  flexion, circles x 15 reps bil    Proximal Shoulder Strengthening, Supine  2# ball; circles x 15 each way; A-Z               PT Short Term Goals - 09/24/19 1158      PT SHORT TERM GOAL #1   Title  independent with initial HEP. (target for STG 4 weeks 09/29/18)    Status  Achieved      PT SHORT TERM GOAL #2   Title  Improve Lt  shoulder PROM to [facility].    Status  Achieved      PT SHORT TERM GOAL #3   Title  Improve overall Lt shoulder strength to 3+ to -4 MMT    Status  Achieved        PT Long Term Goals - 10/01/19 1004      PT LONG TERM GOAL #1   Title  independent with advanced HEP (taget for all LTG 12 weeks 11/22/18)    Status  On-going      PT LONG TERM GOAL #2   Title  Improve Lt shoulder AROM to [facility].    Status  On-going      PT LONG TERM GOAL #3   Title  improve strength to overall 4+ to 5- MMT and be able to lift/carry up to 15 lbs to improve funciton.    Status  On-going      PT LONG TERM GOAL #4   Title  Reduce pain to overall less than 3/10 with ususal activities.    Status  On-going            Plan - 10/22/19 [id number]    Clinical Impression Statement  Pt tolerated session well today with expected fatigue with strengthening exercises.  Will continue to benefit from PT to maximize function.    Personal Factors and Comorbidities  Comorbidity 3+    Comorbidities  PMH: rTSA Lt 07/31/19, bilat TKA, sleep apnea,HTN, cervical DDD,BPH,obesity    Examination-Activity Limitations  Reach Overhead;Carry;Dressing;Lift    Examination-Participation Restrictions  Meal Prep;Cleaning;School;Community Activity;Driving;Laundry;Yard Work    Stability/Clinical Decision Making  Evolving/Moderate complexity     Rehab Potential  Good    PT Frequency  2x /  week    PT Duration  12 weeks    PT Treatment/Interventions  ADLs/Self Care Home Management;Cryotherapy;Electrical Stimulation;Iontophoresis 4mg /ml Dexamethasone;Moist Heat;Ultrasound;Therapeutic activities;Therapeutic exercise;Neuromuscular re-education;Manual techniques;Scar mobilization;Passive range of motion;Dry needling;Joint Manipulations;Taping    PT Next Visit Plan  progress strength as able, needs MD note    PT Home Exercise Plan  Access Code: [email or web address]    Consulted and Agree with Plan of Care  Patient       Patient will benefit from skilled therapeutic intervention in order to improve the following deficits and impairments:  Decreased activity tolerance, Decreased mobility, Decreased strength, Decreased scar mobility, Decreased range of motion, Postural dysfunction, Impaired flexibility, Increased muscle spasms, Increased fascial restricitons, Pain  Visit Diagnosis: Acute pain of left shoulder  Muscle weakness (generalized)  Stiffness of left shoulder, not elsewhere classified     Problem List Patient Active Problem List   Diagnosis Date Noted  . OA (osteoarthritis) of shoulder 07/31/2019  . Allergic rhinoconjunctivitis 01/03/2019  . Seasonal allergic rhinitis due to pollen 04/11/2018  . Vitamin D deficiency 12/21/2017  . 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 Sleep apnea 03/08/2017  . Hyperlipidemia with target LDL less than 100 12/22/2016  . Primary erectile dysfunction 11/18/2014  . Essential hypertension 07/15/2014  . DDD (degenerative disc disease), cervical 10/03/2013  . Esophageal ring, acquired 07/12/2013  . Crohn's ileitis - working dx 06/01/2013  . BPH (benign prostatic hyperplasia) 04/18/2013  . Pernicious anemia-B12 deficiency 04/18/2013  . Severe obesity (BMI >= 40) ([place]) 12/21/2012  . GERD (gastroesophageal reflux disease) 12/21/2012  . Hyperglycemia 08/29/2012  . Routine general medical  examination at a health care facility 08/29/2012      Laureen Abrahams, PT, DPT 10/22/19 9:26 AM    [facility] Physical Therapy [address] [place], [place], [address] Phone: [phone]   Fax:  [phone]  Name: Taylor Schroeder MRN: [id number] Date of Birth: 10-25-1965

## 2019-10-24 ENCOUNTER — Encounter: Payer: Self-pay | Admitting: Physical Therapy

## 2019-10-24 ENCOUNTER — Other Ambulatory Visit: Payer: Self-pay

## 2019-10-24 ENCOUNTER — Ambulatory Visit (INDEPENDENT_AMBULATORY_CARE_PROVIDER_SITE_OTHER): Payer: BC Managed Care – PPO | Admitting: Physical Therapy

## 2019-10-24 DIAGNOSIS — M25512 Pain in left shoulder: Secondary | ICD-10-CM

## 2019-10-24 DIAGNOSIS — M25612 Stiffness of left shoulder, not elsewhere classified: Secondary | ICD-10-CM

## 2019-10-24 DIAGNOSIS — M6281 Muscle weakness (generalized): Secondary | ICD-10-CM | POA: Diagnosis not present

## 2019-10-24 NOTE — Therapy (Signed)
[place], [place], [address] Phone: [phone]   Fax:  [phone]  Physical Therapy Treatment  Patient Details  Name: Taylor Schroeder MRN: [id number] Date of Birth: September 06, 1966 Referring Provider (PT): Magnant, Aldean Jewett, Meredith Pel, MD   Encounter Date: 10/24/2019  PT End of Session - 10/24/19 0921    Visit Number  7    Number of Visits  20    Date for PT Re-Evaluation  11/23/19    Authorization Type  BCBS    PT Start Time  0844    PT Stop Time  0922    PT Time Calculation (min)  38 min    Activity Tolerance  Patient tolerated treatment well    Behavior During Therapy  [facility] for tasks assessed/performed       Past Medical History:  Diagnosis Date  . Arthritis    "qwhere" (02/03/2017)  . BPH (benign prostatic hyperplasia)   . Childhood asthma    "as a baby"  . Coronary artery disease Non-obstructive   a. nonobs cath in 2006;  b. 01/2013 Cath: LM nl, LAD 37m, LCX nl, RCA non-dom, nl. EF 60-65%.  . Esophageal ring, acquired 07/12/2013  . GERD (gastroesophageal reflux disease)   . H/O hiatal hernia    "think so" (02/06/2014)  . Headache    "resolved w/BP control in 11/2015" (02/03/2017)  . High cholesterol   . History of kidney stones   . Hypertension   . Ileitis 2014   Suspected Crohn's, think not as time has gone on  . Obesity   . Pernicious anemia-B12 deficiency 04/18/2013  . PONV (postoperative nausea and vomiting) 02/03/2017; 08/2016  . Sleep apnea    cpap every night    Past Surgical History:  Procedure Laterality Date  . APPENDECTOMY    . CARDIAC CATHETERIZATION  01/2013   "results were clear"  . CARPAL TUNNEL RELEASE Right   . CHOLECYSTECTOMY N/A 02/03/2017   Procedure: LAPAROSCOPIC CHOLECYSTECTOMY;  Surgeon: Greer Pickerel, MD;  Location: [place];  Service: General;  Laterality: N/A;  . COLONOSCOPY  ~ 2014  . COLONOSCOPY    . ESOPHAGOGASTRODUODENOSCOPY  ~ 2014  . INGUINAL HERNIA REPAIR   1966   "? side"  . KNEE ARTHROPLASTY Left 02/06/2014   Procedure: COMPUTER ASSISTED TOTAL KNEE ARTHROPLASTY;  Surgeon: [name], MD;  Location: [place];  Service: Orthopedics;  Laterality: Left;  Cemented Left Total Knee Arthroplasty  . KNEE ARTHROSCOPY Left 08/2016   scar tissue cleaned out  . LAPAROSCOPIC CHOLECYSTECTOMY  02/03/2017  . LEFT HEART CATHETERIZATION WITH CORONARY ANGIOGRAM N/A 01/29/2013   Procedure: LEFT HEART CATHETERIZATION WITH CORONARY ANGIOGRAM;  Surgeon: Thayer Headings, MD;  Location[facility] C[facility] LAB;  Service: Cardiovascular;  Laterality: N/A;  . MULTIPLE TOOTH EXTRACTIONS  09/2016  . REVERSE SHOULDER ARTHROPLASTY Left 07/31/2019   Procedure: LEFT REVERSE SHOULDER ARTHROPLASTY;  Surgeon: Meredith Pel, MD;  Location: [place];  Service: Orthopedics;  Laterality: Left;  . SHOULDER ARTHROSCOPY Left 03/12/2013  . TOTAL KNEE ARTHROPLASTY Left 02/06/2014  . TOTAL KNEE ARTHROPLASTY Right 09/14/2017  . TOTAL KNEE ARTHROPLASTY Right 09/14/2017   Procedure: RIGHT TOTAL KNEE ARTHROPLASTY;  Surgeon: [name], MD;  Location: [place];  Service: Orthopedics;  Laterality: Right;    There were no vitals filed for this visit.  Subjective Assessment - 10/24/19 0847    Subjective  shoulder doing well. "My right one hurts worse than my left one."  Patient Stated Goals  do as much as I can with my shoulder, I know it wont be back to normal.    Currently in Pain?  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    Referring Provider (PT)  Magnant, Aldean Jewett, Meredith Pel, MD    Onset Date/Surgical Date  07/31/19    Hand Dominance  Right      AROM   Left Shoulder Flexion  134 Degrees    Left Shoulder ABduction  113 Degrees      Strength   Strength Assessment Site  Shoulder    Right/Left Shoulder  Left    Left Shoulder Flexion  4/5    Left Shoulder ABduction   4/5    Left Shoulder Internal Rotation  3+/5    Left Shoulder External Rotation  1/5                   [facility] Adult PT Treatment/Exercise - 10/24/19 0848      Shoulder Exercises: Supine   Horizontal ABduction  Left;15 reps;Weights    Horizontal ABduction Weight (lbs)  2    Horizontal ABduction Limitations  adduction    Flexion  Left;Weights;15 reps    Shoulder Flexion Weight (lbs)  2      Shoulder Exercises: Sidelying   External Rotation  Left;15 reps    External Rotation Limitations  limited range; 3 sec hold    ABduction  Left;15 reps;Weights    ABduction Weight (lbs)  2      Shoulder Exercises: Standing   External Rotation  Left;15 reps    Theraband Level (Shoulder External Rotation)  Level 1 (Yellow)    Internal Rotation  Left;15 reps    Theraband Level (Shoulder Internal Rotation)  Level 1 (Yellow)    Flexion  Left;15 reps    Shoulder Flexion Weight (lbs)  2    ABduction  Left;15 reps;Weights    Shoulder ABduction Weight (lbs)  2    Other Standing Exercises  TRX rows 2x10; reverse flys 2x10      Shoulder Exercises: ROM/Strengthening   UBE (Upper Arm Bike)  2.5 min fwd, 2.5 min retro; L4               PT Short Term Goals - 09/24/19 1158      PT SHORT TERM GOAL #1   Title  independent with initial HEP. (target for STG 4 weeks 09/29/18)    Status  Achieved      PT SHORT TERM GOAL #2   Title  Improve Lt  shoulder PROM to [facility].    Status  Achieved      PT SHORT TERM GOAL #3   Title  Improve overall Lt shoulder strength to 3+ to -4 MMT    Status  Achieved        PT Long Term Goals - 10/01/19 1004      PT LONG TERM GOAL #1   Title  independent with advanced HEP (taget for all LTG 12 weeks 11/22/18)    Status  On-going      PT LONG TERM GOAL #2   Title  Improve Lt shoulder AROM to [facility].    Status  On-going      PT LONG TERM GOAL #3   Title  improve strength to overall 4+ to 5- MMT  and be able to lift/carry up to 15 lbs to improve funciton.     Status  On-going      PT LONG TERM GOAL #4   Title  Reduce pain to overall less than 3/10 with ususal activities.    Status  On-going            Plan - 10/24/19 [id number]    Clinical Impression Statement  Overall pt progressing well with improved ROM today.  Continues to demonstrate decreased strength and at this time strength is main limiting factor.  Will continue to benefit from PT to maximize function.    Personal Factors and Comorbidities  Comorbidity 3+    Comorbidities  PMH: rTSA Lt 07/31/19, bilat TKA, sleep apnea,HTN, cervical DDD,BPH,obesity    Examination-Activity Limitations  Reach Overhead;Carry;Dressing;Lift    Examination-Participation Restrictions  Meal Prep;Cleaning;School;Community Activity;Driving;Laundry;Yard Work    [employer] officer  Evolving/Moderate complexity    Rehab Potential  Good    PT Frequency  2x / week    PT Duration  12 weeks    PT Treatment/Interventions  ADLs/Self Care Home Management;Cryotherapy;Electrical Stimulation;Iontophoresis 4mg /ml Dexamethasone;Moist Heat;Ultrasound;Therapeutic activities;Therapeutic exercise;Neuromuscular re-education;Manual techniques;Scar mobilization;Passive range of motion;Dry needling;Joint Manipulations;Taping    PT Next Visit Plan  continue with strengthening see what MD says    PT Home Exercise Plan  Access Code: [email or web address]    Consulted and Agree with Plan of Care  Patient       Patient will benefit from skilled therapeutic intervention in order to improve the following deficits and impairments:  Decreased activity tolerance, Decreased mobility, Decreased strength, Decreased scar mobility, Decreased range of motion, Postural dysfunction, Impaired flexibility, Increased muscle spasms, Increased fascial restricitons, Pain  Visit Diagnosis: Acute pain of left shoulder  Muscle weakness (generalized)  Stiffness of left shoulder, not elsewhere classified     Problem List Patient Active Problem List    Diagnosis Date Noted  . OA (osteoarthritis) of shoulder 07/31/2019  . Allergic rhinoconjunctivitis 01/03/2019  . Seasonal allergic rhinitis due to pollen 04/11/2018  . Vitamin D deficiency 12/21/2017  . 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 Sleep apnea 03/08/2017  . Hyperlipidemia with target LDL less than 100 12/22/2016  . Primary erectile dysfunction 11/18/2014  . Essential hypertension 07/15/2014  . DDD (degenerative disc disease), cervical 10/03/2013  . Esophageal ring, acquired 07/12/2013  . Crohn's ileitis - working dx 06/01/2013  . BPH (benign prostatic hyperplasia) 04/18/2013  . Pernicious anemia-B12 deficiency 04/18/2013  . Severe obesity (BMI >= 40) ([place]) 12/21/2012  . GERD (gastroesophageal reflux disease) 12/21/2012  . Hyperglycemia 08/29/2012  . Routine general medical examination at a health care facility 08/29/2012      Laureen Abrahams, PT, DPT 10/24/19 9:23 AM    [facility] Physical Therapy [address] [place], [place], [address] Phone: [phone]   Fax:  [phone]  Name: Taylor Schroeder MRN: [id number] Date of Birth: November 07, 1965

## 2019-10-26 ENCOUNTER — Encounter: Payer: Self-pay | Admitting: Orthopedic Surgery

## 2019-10-26 ENCOUNTER — Ambulatory Visit (INDEPENDENT_AMBULATORY_CARE_PROVIDER_SITE_OTHER): Payer: BC Managed Care – PPO | Admitting: Orthopedic Surgery

## 2019-10-26 ENCOUNTER — Other Ambulatory Visit: Payer: Self-pay

## 2019-10-26 DIAGNOSIS — Z96612 Presence of left artificial shoulder joint: Secondary | ICD-10-CM

## 2019-10-27 NOTE — Progress Notes (Signed)
Post-Op Visit Note   Patient: Taylor Schroeder           Date of Birth: November 09, 1965           MRN: [id number] Visit Date: 10/26/2019 PCP: Taylor Lima, MD   Assessment & Plan:  Chief Complaint:  Chief Complaint  Patient presents with  . Follow-up   Visit Diagnoses:  1. Status post reverse arthroplasty of left shoulder     Plan: Taylor Schroeder is a patient is now about 2 and half months out left reverse shoulder replacement.  Doing therapy twice a week.  Still reports some weakness in that left arm.  He does do heating and air.  On exam he actually has excellent range of motion with forward flexion abduction both above 90 degrees.  Motor or sensory function to the hand is intact.  Impression is well-functioning left reverse shoulder replacement with good range of motion and good pain relief.  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  He is can have to really consider that before deciding about going back to that type of work.  I do think it is a risk that prosthetic longevity will be shortened with the vigorous work required in [employer] work.  Come back in 6 weeks for clinical recheck and likely release.  Continue with therapy for strengthening and range of motion.  Follow-Up Instructions: Return in about 6 weeks (around 12/07/2019).   Orders:  No orders of the defined types were placed in this encounter.  No orders of the defined types were placed in this encounter.   Imaging: No results found.  PMFS History: Patient Active Problem List   Diagnosis Date Noted  . OA (osteoarthritis) of shoulder 07/31/2019  . Allergic rhinoconjunctivitis 01/03/2019  . Seasonal allergic rhinitis due to pollen 04/11/2018  . Vitamin D deficiency 12/21/2017  . Primary osteoarthritis of left  shoulder 06/13/2017  . Sleep apnea 03/08/2017  . Hyperlipidemia with target LDL less than 100 12/22/2016  . Primary erectile dysfunction 11/18/2014  . Essential hypertension 07/15/2014  . DDD (degenerative disc disease), cervical 10/03/2013  . Esophageal ring, acquired 07/12/2013  . Crohn's ileitis - working dx 06/01/2013  . BPH (benign prostatic hyperplasia) 04/18/2013  . Pernicious anemia-B12 deficiency 04/18/2013  . Severe obesity (BMI >= 40) ([place]) 12/21/2012  . GERD (gastroesophageal reflux disease) 12/21/2012  . Hyperglycemia 08/29/2012  . Routine general medical examination at a health care facility 08/29/2012   Past Medical History:  Diagnosis Date  . Arthritis    "qwhere" (02/03/2017)  . BPH (benign prostatic hyperplasia)   . Childhood asthma    "as a baby"  . Coronary artery disease Non-obstructive   a. nonobs cath in 2006;  b. 01/2013 Cath: LM nl, LAD 59m, LCX nl, RCA non-dom, nl. EF 60-65%.  . Esophageal ring, acquired 07/12/2013  . GERD (gastroesophageal reflux disease)   . H/O hiatal hernia    "think so" (02/06/2014)  . Headache    "resolved w/BP control in 11/2015" (02/03/2017)  . High cholesterol   . History of kidney stones   . Hypertension   . Ileitis 2014   Suspected Crohn's, think not as time has gone on  . Obesity   . Pernicious anemia-B12 deficiency 04/18/2013  . PONV (postoperative nausea and vomiting) 02/03/2017; 08/2016  .  Sleep apnea    cpap every night    Family History  Problem Relation Age of Onset  . Hypertension Mother   . Cancer Father        type unknown  . COPD Other   . Emphysema Maternal Grandmother   . Emphysema Maternal Grandfather   . Other Paternal Grandmother        spinal cancer  . Alcohol abuse Neg Hx   . Diabetes Neg Hx   . Early death Neg Hx   . Heart disease Neg Hx   . Hyperlipidemia Neg Hx   . Kidney disease Neg Hx   . Stroke Neg Hx   . Colon cancer Neg Hx   . Stomach cancer Neg Hx   . Esophageal cancer Neg Hx   .  Rectal cancer Neg Hx   . Liver cancer Neg Hx     Past Surgical History:  Procedure Laterality Date  . APPENDECTOMY    . CARDIAC CATHETERIZATION  01/2013   "results were clear"  . CARPAL TUNNEL RELEASE Right   . CHOLECYSTECTOMY N/A 02/03/2017   Procedure: LAPAROSCOPIC CHOLECYSTECTOMY;  Surgeon: Greer Pickerel, MD;  Location: [place];  Service: General;  Laterality: N/A;  . COLONOSCOPY  ~ 2014  . COLONOSCOPY    . ESOPHAGOGASTRODUODENOSCOPY  ~ 2014  . INGUINAL HERNIA REPAIR  1966   "? side"  . KNEE ARTHROPLASTY Left 02/06/2014   Procedure: COMPUTER ASSISTED TOTAL KNEE ARTHROPLASTY;  Surgeon: [name], MD;  Location: [place];  Service: Orthopedics;  Laterality: Left;  Cemented Left Total Knee Arthroplasty  . KNEE ARTHROSCOPY Left 08/2016   scar tissue cleaned out  . LAPAROSCOPIC CHOLECYSTECTOMY  02/03/2017  . LEFT HEART CATHETERIZATION WITH CORONARY ANGIOGRAM N/A 01/29/2013   Procedure: LEFT HEART CATHETERIZATION WITH CORONARY ANGIOGRAM;  Surgeon: Thayer Headings, MD;  Location: [facility] CATH LAB;  Service: Cardiovascular;  Laterality: N/A;  . MULTIPLE TOOTH EXTRACTIONS  09/2016  . REVERSE SHOULDER ARTHROPLASTY Left 07/31/2019   Procedure: LEFT REVERSE SHOULDER ARTHROPLASTY;  Surgeon: Meredith Pel, MD;  Location: [place];  Service: Orthopedics;  Laterality: Left;  . SHOULDER ARTHROSCOPY Left 03/12/2013  . TOTAL KNEE ARTHROPLASTY Left 02/06/2014  . TOTAL KNEE ARTHROPLASTY Right 09/14/2017  . TOTAL KNEE ARTHROPLASTY Right 09/14/2017   Procedure: RIGHT TOTAL KNEE ARTHROPLASTY;  Surgeon: [name], MD;  Location: [place];  Service: Orthopedics;  Laterality: Right;   Social History   Occupational History  . Occupation: MAINTENANCE    Employer: [employer]  Tobacco Use  . Smoking status: Former Smoker    Packs/day: 0.00    Years: 30.00    Pack years: 0.00    Quit date: 08/29/2008    Years since quitting: 11.1  . Smokeless tobacco: Never Used  Substance and Sexual Activity  .  Alcohol use: No    Comment: 02/03/2017 "maybe 12 pack of beer all at one time but only once/year"; 02/06/2014 "couple beers a couple times/month"  . Drug use: No  . Sexual activity: Yes

## 2019-10-29 ENCOUNTER — Ambulatory Visit (INDEPENDENT_AMBULATORY_CARE_PROVIDER_SITE_OTHER): Payer: BC Managed Care – PPO | Admitting: Physical Therapy

## 2019-10-29 ENCOUNTER — Other Ambulatory Visit: Payer: Self-pay

## 2019-10-29 DIAGNOSIS — M25612 Stiffness of left shoulder, not elsewhere classified: Secondary | ICD-10-CM

## 2019-10-29 DIAGNOSIS — M6281 Muscle weakness (generalized): Secondary | ICD-10-CM

## 2019-10-29 DIAGNOSIS — M25512 Pain in left shoulder: Secondary | ICD-10-CM | POA: Diagnosis not present

## 2019-10-29 NOTE — Therapy (Signed)
[place], [place], [address] Phone: [phone]   Fax:  [phone]  Physical Therapy Treatment  Patient Details  Name: Taylor Schroeder MRN: [id number] Date of Birth: Jan 31, 1966 Referring Provider (PT): Magnant, Aldean Jewett, Meredith Pel, MD   Encounter Date: 10/29/2019  PT End of Session - 10/29/19 0950    Visit Number  8    Number of Visits  20    Date for PT Re-Evaluation  11/23/19    Authorization Type  BCBS    PT Start Time  0845    PT Stop Time  0925    PT Time Calculation (min)  40 min    Activity Tolerance  Patient tolerated treatment well    Behavior During Therapy  Taylor Schroeder for tasks assessed/performed       Past Medical History:  Diagnosis Date  . Arthritis    "qwhere" (02/03/2017)  . BPH (benign prostatic hyperplasia)   . Childhood asthma    "as a baby"  . Coronary artery disease Non-obstructive   a. nonobs cath in 2006;  b. 01/2013 Cath: LM nl, LAD 70m, LCX nl, RCA non-dom, nl. EF 60-65%.  . Esophageal ring, acquired 07/12/2013  . GERD (gastroesophageal reflux disease)   . H/O hiatal hernia    "think so" (02/06/2014)  . Headache    "resolved w/BP control in 11/2015" (02/03/2017)  . High cholesterol   . History of kidney stones   . Hypertension   . Ileitis 2014   Suspected Crohn's, think not as time has gone on  . Obesity   . Pernicious anemia-B12 deficiency 04/18/2013  . PONV (postoperative nausea and vomiting) 02/03/2017; 08/2016  . Sleep apnea    cpap every night    Past Surgical History:  Procedure Laterality Date  . APPENDECTOMY    . CARDIAC CATHETERIZATION  01/2013   "results were clear"  . CARPAL TUNNEL RELEASE Right   . CHOLECYSTECTOMY N/A 02/03/2017   Procedure: LAPAROSCOPIC CHOLECYSTECTOMY;  Surgeon: Greer Pickerel, MD;  Location: [place];  Service: General;  Laterality: N/A;  . COLONOSCOPY  ~ 2014  . COLONOSCOPY    . ESOPHAGOGASTRODUODENOSCOPY  ~ 2014  . INGUINAL HERNIA REPAIR   1966   "? side"  . KNEE ARTHROPLASTY Left 02/06/2014   Procedure: COMPUTER ASSISTED TOTAL KNEE ARTHROPLASTY;  Surgeon: [name], MD;  Location: [place];  Service: Orthopedics;  Laterality: Left;  Cemented Left Total Knee Arthroplasty  . KNEE ARTHROSCOPY Left 08/2016   scar tissue cleaned out  . LAPAROSCOPIC CHOLECYSTECTOMY  02/03/2017  . LEFT HEART CATHETERIZATION WITH CORONARY ANGIOGRAM N/A 01/29/2013   Procedure: LEFT HEART CATHETERIZATION WITH CORONARY ANGIOGRAM;  Surgeon: Thayer Headings, MD;  Location: [facility] CATH LAB;  Service: Cardiovascular;  Laterality: N/A;  . MULTIPLE TOOTH EXTRACTIONS  09/2016  . REVERSE SHOULDER ARTHROPLASTY Left 07/31/2019   Procedure: LEFT REVERSE SHOULDER ARTHROPLASTY;  Surgeon: Meredith Pel, MD;  Location: [place];  Service: Orthopedics;  Laterality: Left;  . SHOULDER ARTHROSCOPY Left 03/12/2013  . TOTAL KNEE ARTHROPLASTY Left 02/06/2014  . TOTAL KNEE ARTHROPLASTY Right 09/14/2017  . TOTAL KNEE ARTHROPLASTY Right 09/14/2017   Procedure: RIGHT TOTAL KNEE ARTHROPLASTY;  Surgeon: [name], MD;  Location: [place];  Service: Orthopedics;  Laterality: Right;    There were no vitals filed for this visit.  Subjective Assessment - 10/29/19 0900    Subjective  2/10 Lt shoulder pain, 5/10 arm soreness in his elbow    Pertinent History  PMH: rTSA Lt 07/31/19, Rt TKA, sleep apnea,HTN, cervical DDD,BPH,obesity    Diagnostic tests  "Left shoulder x-rays show a well fixed and aligned prosthesis with no complicating features."    Patient Stated Goals  do as much as I can with my shoulder, I know it wont be back to normal.    Pain Onset  More than a month ago                       [facility] Adult PT Treatment/Exercise - 10/29/19 0001      Shoulder Exercises: Sidelying   External Rotation  Left;15 reps    External Rotation Weight (lbs)  2 lb    External Rotation Limitations  limited range    ABduction  Left;15 reps;Weights    ABduction Weight (lbs)  2       Shoulder Exercises: Standing   External Rotation  Left;15 reps    Theraband Level (Shoulder External Rotation)  Level 2 (Red)    Internal Rotation  Left;15 reps    Theraband Level (Shoulder Internal Rotation)  Level 2 (Red)    Flexion  Left;15 reps    Shoulder Flexion Weight (lbs)  2    ABduction  Left;15 reps;Weights    Shoulder ABduction Weight (lbs)  2    Extension  15 reps;Theraband    Theraband Level (Shoulder Extension)  Level 4 (Blue)    Row  15 reps    Theraband Level (Shoulder Row)  Level 4 (Blue)    Other Standing Exercises  TRX rows X15; reverse flys x10, farmers carry 5 lbs one lap, biceps curl 5 lbs X 15 reps, triceps ext with L2 band over top of door X 15 reps    Other Standing Exercises  2#ball stability rolls up/down, lateral, circles X 10 ea, countertop pusups X 10      Shoulder Exercises: Pulleys   Flexion  1 minute    ABduction  1 minute      Shoulder Exercises: ROM/Strengthening   UBE (Upper Arm Bike)  2.5 min fwd, 2.5 min retro; L4               PT Short Term Goals - 09/24/19 1158      PT SHORT TERM GOAL #1   Title  independent with initial HEP. (target for STG 4 weeks 09/29/18)    Status  Achieved      PT SHORT TERM GOAL #2   Title  Improve Lt  shoulder PROM to [facility].    Status  Achieved      PT SHORT TERM GOAL #3   Title  Improve overall Lt shoulder strength to 3+ to -4 MMT    Status  Achieved        PT Long Term Goals - 10/01/19 1004      PT LONG TERM GOAL #1   Title  independent with advanced HEP (taget for all LTG 12 weeks 11/22/18)    Status  On-going      PT LONG TERM GOAL #2   Title  Improve Lt shoulder AROM to [facility].    Status  On-going      PT LONG TERM GOAL #3   Title  improve strength to overall 4+ to 5- MMT and be able to lift/carry up to 15 lbs to improve funciton.    Status  On-going      PT LONG TERM GOAL #4   Title  Reduce pain to overall less than 3/10  with ususal activities.    Status  On-going             Plan - 10/29/19 0950    Clinical Impression Statement  Progressed strengtheing program as tolerated for his Lt shoulder. He continues to lack strength and stability but is progressing with these with PT. Continue POC    Personal Factors and Comorbidities  Comorbidity 3+    Comorbidities  PMH: rTSA Lt 07/31/19, bilat TKA, sleep apnea,HTN, cervical DDD,BPH,obesity    Examination-Activity Limitations  Reach Overhead;Carry;Dressing;Lift    Examination-Participation Restrictions  Meal Prep;Cleaning;School;Community Activity;Driving;Laundry;Yard Work    [employer]chant [employer] officer  Evolving/Moderate complexity    Rehab Potential  Good    PT Frequency  2x / week    PT Duration  12 weeks    PT Treatment/Interventions  ADLs/Self Care Home Management;Cryotherapy;Electrical Stimulation;Iontophoresis 4mg /ml Dexamethasone;Moist Heat;Ultrasound;Therapeutic activities;Therapeutic exercise;Neuromuscular re-education;Manual techniques;Scar mobilization;Passive range of motion;Dry needling;Joint Manipulations;Taping    PT Next Visit Plan  continue with strengthening see what MD says    PT Home Exercise Plan  Access Code: [email or web address]    Consulted and Agree with Plan of Care  Patient       Patient will benefit from skilled therapeutic intervention in order to improve the following deficits and impairments:  Decreased activity tolerance, Decreased mobility, Decreased strength, Decreased scar mobility, Decreased range of motion, Postural dysfunction, Impaired flexibility, Increased muscle spasms, Increased fascial restricitons, Pain  Visit Diagnosis: Acute pain of left shoulder  Muscle weakness (generalized)  Stiffness of left shoulder, not elsewhere classified     Problem List Patient Active Problem List   Diagnosis Date Noted  . OA (osteoarthritis) of shoulder 07/31/2019  . Allergic rhinoconjunctivitis 01/03/2019  . Seasonal allergic rhinitis due to pollen 04/11/2018  . Vitamin D  deficiency 12/21/2017  . 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 Sleep apnea 03/08/2017  . Hyperlipidemia with target LDL less than 100 12/22/2016  . Primary erectile dysfunction 11/18/2014  . Essential hypertension 07/15/2014  . DDD (degenerative disc disease), cervical 10/03/2013  . Esophageal ring, acquired 07/12/2013  . Crohn's ileitis - working dx 06/01/2013  . BPH (benign prostatic hyperplasia) 04/18/2013  . Pernicious anemia-B12 deficiency 04/18/2013  . Severe obesity (BMI >= 40) ([place]) 12/21/2012  . GERD (gastroesophageal reflux disease) 12/21/2012  . Hyperglycemia 08/29/2012  . Routine general medical examination at a health care facility 08/29/2012    Taylor Schroeder 10/29/2019, 9:53 AM  [facility] Physical Therapy [address] [place], [place], [address] Phone: [phone]   Fax:  [phone]  Name: Taylor Schroeder MRN: [id number] Date of Birth: 1966-05-26

## 2019-10-31 ENCOUNTER — Ambulatory Visit (INDEPENDENT_AMBULATORY_CARE_PROVIDER_SITE_OTHER): Payer: BC Managed Care – PPO | Admitting: Physical Therapy

## 2019-10-31 ENCOUNTER — Other Ambulatory Visit: Payer: Self-pay

## 2019-10-31 ENCOUNTER — Encounter: Payer: Self-pay | Admitting: Physical Therapy

## 2019-10-31 DIAGNOSIS — M25512 Pain in left shoulder: Secondary | ICD-10-CM | POA: Diagnosis not present

## 2019-10-31 DIAGNOSIS — M25612 Stiffness of left shoulder, not elsewhere classified: Secondary | ICD-10-CM

## 2019-10-31 DIAGNOSIS — M6281 Muscle weakness (generalized): Secondary | ICD-10-CM

## 2019-10-31 NOTE — Therapy (Signed)
[place], [place], [address] Phone: [phone]   Fax:  [phone]  Physical Therapy Treatment  Patient Details  Name: Taylor Schroeder MRN: [id number] Date of Birth: [birth year] Referring Provider (PT): Magnant, Aldean Jewett, Meredith Pel, MD   Encounter Date: 10/31/2019  PT End of Session - 10/31/19 0921    Visit Number  9    Number of Visits  20    Date for PT Re-Evaluation  11/23/19    Authorization Type  BCBS    PT Start Time  0839    PT Stop Time  0919    PT Time Calculation (min)  40 min    Activity Tolerance  Patient tolerated treatment well    Behavior During Therapy  [facility] for tasks assessed/performed       Past Medical History:  Diagnosis Date  . Arthritis    "qwhere" (02/03/2017)  . BPH (benign prostatic hyperplasia)   . Childhood asthma    "as a baby"  . Coronary artery disease Non-obstructive   a. nonobs cath in 2006;  b. 01/2013 Cath: LM nl, LAD 18m, LCX nl, RCA non-dom, nl. EF 60-65%.  . Esophageal ring, acquired 07/12/2013  . GERD (gastroesophageal reflux disease)   . H/O hiatal hernia    "think so" (02/06/2014)  . Headache    "resolved w/BP control in 11/2015" (02/03/2017)  . High cholesterol   . History of kidney stones   . Hypertension   . Ileitis 2014   Suspected Crohn's, think not as time has gone on  . Obesity   . Pernicious anemia-B12 deficiency 04/18/2013  . PONV (postoperative nausea and vomiting) 02/03/2017; 08/2016  . Sleep apnea    cpap every night    Past Surgical History:  Procedure Laterality Date  . APPENDECTOMY    . CARDIAC CATHETERIZATION  01/2013   "results were clear"  . CARPAL TUNNEL RELEASE Right   . CHOLECYSTECTOMY N/A 02/03/2017   Procedure: LAPAROSCOPIC CHOLECYSTECTOMY;  Surgeon: Greer Pickerel, MD;  Location: [place];  Service: General;  Laterality: N/A;  . COLONOSCOPY  ~ 2014  . COLONOSCOPY    . ESOPHAGOGASTRODUODENOSCOPY  ~ 2014  . INGUINAL HERNIA REPAIR   1966   "? side"  . KNEE ARTHROPLASTY Left 02/06/2014   Procedure: COMPUTER ASSISTED TOTAL KNEE ARTHROPLASTY;  Surgeon: [name], MD;  Location: [place];  Service: Orthopedics;  Laterality: Left;  Cemented Left Total Knee Arthroplasty  . KNEE ARTHROSCOPY Left 08/2016   scar tissue cleaned out  . LAPAROSCOPIC CHOLECYSTECTOMY  02/03/2017  . LEFT HEART CATHETERIZATION WITH CORONARY ANGIOGRAM N/A 01/29/2013   Procedure: LEFT HEART CATHETERIZATION WITH CORONARY ANGIOGRAM;  Surgeon: Thayer Headings, MD;  Location: [facility] CATH LAB;  Service: Cardiovascular;  Laterality: N/A;  . MULTIPLE TOOTH EXTRACTIONS  09/2016  . REVERSE SHOULDER ARTHROPLASTY Left 07/31/2019   Procedure: LEFT REVERSE SHOULDER ARTHROPLASTY;  Surgeon: Meredith Pel, MD;  Location: [place];  Service: Orthopedics;  Laterality: Left;  . SHOULDER ARTHROSCOPY Left 03/12/2013  . TOTAL KNEE ARTHROPLASTY Left 02/06/2014  . TOTAL KNEE ARTHROPLASTY Right 09/14/2017  . TOTAL KNEE ARTHROPLASTY Right 09/14/2017   Procedure: RIGHT TOTAL KNEE ARTHROPLASTY;  Surgeon: [name], MD;  Location: [place];  Service: Orthopedics;  Laterality: Right;    There were no vitals filed for this visit.  Subjective Assessment - 10/31/19 0840    Subjective  sore after last session.  better today.    Pertinent History  PMH: rTSA  Lt 07/31/19, Rt TKA, sleep apnea,HTN, cervical DDD,BPH,obesity    Diagnostic tests  "Left shoulder x-rays show a well fixed and aligned prosthesis with no complicating features."    Patient Stated Goals  do as much as I can with my shoulder, I know it wont be back to normal.    Currently in Pain?  No/denies    Pain Onset  More than a month ago                       [facility] Adult PT Treatment/Exercise - 10/31/19 0841      Shoulder Exercises: Prone   Extension  Left;20 reps;Weights    Extension Weight (lbs)  2    Horizontal ABduction 1  Left;20 reps;Weights    Horizontal ABduction 1 Weight (lbs)  2      Shoulder  Exercises: Sidelying   External Rotation  Left;20 reps    External Rotation Weight (lbs)  2 lb    External Rotation Limitations  limited range    ABduction  Left;20 reps    ABduction Weight (lbs)  2      Shoulder Exercises: Standing   Flexion  Left;20 reps    Shoulder Flexion Weight (lbs)  2    ABduction  Left;Weights;15 reps    Shoulder ABduction Weight (lbs)  2    Extension  20 reps;Theraband    Theraband Level (Shoulder Extension)  Level 4 (Blue)    Row  20 reps;Theraband    Theraband Level (Shoulder Row)  Level 4 (Blue)    Other Standing Exercises  TRX rows 2x10; reverse flys 2x10; bicep curls 2x10      Shoulder Exercises: ROM/Strengthening   UBE (Upper Arm Bike)  2.5 min fwd, 2.5 min retro; L5    Proximal Shoulder Strengthening, Supine  2# ball; circles x 20 each way; A-Z      Manual Therapy   Manual Therapy  Soft tissue mobilization    Soft tissue mobilization  Lt shoulder at incision - very tender               PT Short Term Goals - 09/24/19 1158      PT SHORT TERM GOAL #1   Title  independent with initial HEP. (target for STG 4 weeks 09/29/18)    Status  Achieved      PT SHORT TERM GOAL #2   Title  Improve Lt  shoulder PROM to [facility].    Status  Achieved      PT SHORT TERM GOAL #3   Title  Improve overall Lt shoulder strength to 3+ to -4 MMT    Status  Achieved        PT Long Term Goals - 10/01/19 1004      PT LONG TERM GOAL #1   Title  independent with advanced HEP (taget for all LTG 12 weeks 11/22/18)    Status  On-going      PT LONG TERM GOAL #2   Title  Improve Lt shoulder AROM to [facility].    Status  On-going      PT LONG TERM GOAL #3   Title  improve strength to overall 4+ to 5- MMT and be able to lift/carry up to 15 lbs to improve funciton.    Status  On-going      PT LONG TERM GOAL #4   Title  Reduce pain to overall less than 3/10 with ususal activities.    Status  On-going  Plan - 10/31/19 0921    Clinical Impression  Statement  Pt is tolerating strengthening exercises well with some reports of discomfort and muscle fatigue.  Overall at this time feel he is doing well and ready to begin transition to HEP and gym program.  Plan to begin d/c preparations next week.    Personal Factors and Comorbidities  Comorbidity 3+    Comorbidities  PMH: rTSA Lt 07/31/19, bilat TKA, sleep apnea,HTN, cervical DDD,BPH,obesity    Examination-Activity Limitations  Reach Overhead;Carry;Dressing;Lift    Examination-Participation Restrictions  Meal Prep;Cleaning;School;Community Activity;Driving;Laundry;Yard Work    [employer] officer  Evolving/Moderate complexity    Rehab Potential  Good    PT Frequency  2x / week    PT Duration  12 weeks    PT Treatment/Interventions  ADLs/Self Care Home Management;Cryotherapy;Electrical Stimulation;Iontophoresis 4mg /ml Dexamethasone;Moist Heat;Ultrasound;Therapeutic activities;Therapeutic exercise;Neuromuscular re-education;Manual techniques;Scar mobilization;Passive range of motion;Dry needling;Joint Manipulations;Taping    PT Next Visit Plan  work on HEP/gym program.  plan for d/c next week    PT Home Exercise Plan  Access Code: [email or web address]    Consulted and Agree with Plan of Care  Patient       Patient will benefit from skilled therapeutic intervention in order to improve the following deficits and impairments:  Decreased activity tolerance, Decreased mobility, Decreased strength, Decreased scar mobility, Decreased range of motion, Postural dysfunction, Impaired flexibility, Increased muscle spasms, Increased fascial restricitons, Pain  Visit Diagnosis: Acute pain of left shoulder  Muscle weakness (generalized)  Stiffness of left shoulder, not elsewhere classified     Problem List Patient Active Problem List   Diagnosis Date Noted  . OA (osteoarthritis) of shoulder 07/31/2019  . Allergic rhinoconjunctivitis 01/03/2019  . Seasonal allergic rhinitis due to pollen  04/11/2018  . Vitamin D deficiency 12/21/2017  . 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 Sleep apnea 03/08/2017  . Hyperlipidemia with target LDL less than 100 12/22/2016  . Primary erectile dysfunction 11/18/2014  . Essential hypertension 07/15/2014  . DDD (degenerative disc disease), cervical 10/03/2013  . Esophageal ring, acquired 07/12/2013  . Crohn's ileitis - working dx 06/01/2013  . BPH (benign prostatic hyperplasia) 04/18/2013  . Pernicious anemia-B12 deficiency 04/18/2013  . Severe obesity (BMI >= 40) ([place]) 12/21/2012  . GERD (gastroesophageal reflux disease) 12/21/2012  . Hyperglycemia 08/29/2012  . Routine general medical examination at a health care facility 08/29/2012      Laureen Abrahams, PT, DPT 10/31/19 9:23 AM     [facility] Physical Therapy [address] [place], [place], [address] Phone: [phone]   Fax:  [phone]  Name: Taylor Schroeder MRN: [id number] Date of Birth: 1966/03/03

## 2019-11-01 ENCOUNTER — Other Ambulatory Visit: Payer: Self-pay | Admitting: Internal Medicine

## 2019-11-01 DIAGNOSIS — E559 Vitamin D deficiency, unspecified: Secondary | ICD-10-CM

## 2019-11-02 ENCOUNTER — Telehealth: Payer: Self-pay

## 2019-11-02 NOTE — Telephone Encounter (Signed)
I do not have any forms for this patient.  

## 2019-11-02 NOTE — Telephone Encounter (Signed)
Patient calling and states that he is wanting an update on some paperwork that is part a disability claim he started. States that he sent the paperwork to the company on 10/18/2019 and they have received work back from his other Doctors, just not Dr Ronnald Ramp or [employer]. States that it is through [employer] and the requestor is [employer]. Would like a call with an update. CB#: [id number]

## 2019-11-02 NOTE — Telephone Encounter (Signed)
I have not seen paperwork for pt, have either of you?

## 2019-11-05 ENCOUNTER — Ambulatory Visit (INDEPENDENT_AMBULATORY_CARE_PROVIDER_SITE_OTHER): Payer: BC Managed Care – PPO | Admitting: Physical Therapy

## 2019-11-05 ENCOUNTER — Encounter: Payer: Self-pay | Admitting: Physical Therapy

## 2019-11-05 ENCOUNTER — Other Ambulatory Visit: Payer: Self-pay

## 2019-11-05 DIAGNOSIS — M25612 Stiffness of left shoulder, not elsewhere classified: Secondary | ICD-10-CM | POA: Diagnosis not present

## 2019-11-05 DIAGNOSIS — M6281 Muscle weakness (generalized): Secondary | ICD-10-CM | POA: Diagnosis not present

## 2019-11-05 DIAGNOSIS — M25512 Pain in left shoulder: Secondary | ICD-10-CM | POA: Diagnosis not present

## 2019-11-05 NOTE — Patient Instructions (Signed)
Access Code: [email or web address]  URL: [email or web address]  Date: 11/05/2019  Prepared by: Faustino Congress   Exercises Supine Shoulder External Rotation in 45 Degrees Abduction AAROM with Dowel - 10 reps - 1-2 sets - 2x daily - 7x weekly Supine Shoulder Flexion Extension AAROM with Dowel - 10 reps - 1-2 sets - 2x daily - 7x weekly Isometric Shoulder Flexion - 10 reps - 1 sets - 5 hold - 2x daily - 7x weekly Isometric Shoulder External Rotation - 10 reps - 1 sets - 5 hold - 2x daily - 7x weekly Isometric Shoulder Internal Rotation - 10 reps - 1 sets - 5 hold - 2x daily - 7x weekly Isometric Shoulder Abduction at Wall - 10 reps - 1 sets - 5 hold - 2x daily - 7x weekly Seated Row [employer] - 10 reps - 3 sets - 1x daily - 7x weekly Pec Fly Machine - 10 reps - 3 sets - 1x daily - 7x weekly Standing Biceps Curl with Barbell - 10 reps - 3 sets - 1x daily - 7x weekly Standing Bent Over Triceps Extension - 10 reps - 3 sets - 1x daily - 7x weekly Standing Shoulder Flexion to 90 Degrees with Dumbbells - 10 reps - 3 sets - 1x daily - 7x weekly Shoulder Abduction with Dumbbells - Thumbs Up - 10 reps - 3 sets - 1x daily - 7x weekly Shoulder Overhead Press in Flexion with Dumbbells - 10 reps - 3 sets - 1x daily - 7x weekly

## 2019-11-05 NOTE — Therapy (Signed)
[place], [place], [address] Phone: [phone]   Fax:  [phone]  Physical Therapy Treatment  Patient Details  Name: Taylor Schroeder MRN: [id number] Date of Birth: 01/08/1966 Referring Provider (PT): Magnant, Aldean Jewett, Meredith Pel, MD   Encounter Date: 11/05/2019  PT End of Session - 11/05/19 0922    Visit Number  10    Number of Visits  20    Date for PT Re-Evaluation  11/23/19    Authorization Type  BCBS    PT Start Time  [phone]    PT Stop Time  0922    PT Time Calculation (min)  39 min    Activity Tolerance  Patient tolerated treatment well    Behavior During Therapy  [facility] for tasks assessed/performed       Past Medical History:  Diagnosis Date  . Arthritis    "qwhere" (02/03/2017)  . BPH (benign prostatic hyperplasia)   . Childhood asthma    "as a baby"  . Coronary artery disease Non-obstructive   a. nonobs cath in 2006;  b. 01/2013 Cath: LM nl, LAD 83m LCX nl, RCA non-dom, nl. EF 60-65%.  . Esophageal ring, acquired 07/12/2013  . GERD (gastroesophageal reflux disease)   . H/O hiatal hernia    "think so" (02/06/2014)  . Headache    "resolved w/BP control in 11/2015" (02/03/2017)  . High cholesterol   . History of kidney stones   . Hypertension   . Ileitis 2014   Suspected Crohn's, think not as time has gone on  . Obesity   . Pernicious anemia-B12 deficiency 04/18/2013  . PONV (postoperative nausea and vomiting) 02/03/2017; 08/2016  . Sleep apnea    cpap every night    Past Surgical History:  Procedure Laterality Date  . APPENDECTOMY    . CARDIAC CATHETERIZATION  01/2013   "results were clear"  . CARPAL TUNNEL RELEASE Right   . CHOLECYSTECTOMY N/A 02/03/2017   Procedure: LAPAROSCOPIC CHOLECYSTECTOMY;  Surgeon: WGreer Pickerel MD;  [place]  Service: General;  Laterality: N/A;  . COLONOSCOPY  ~ 2014  . COLONOSCOPY    . ESOPHAGOGASTRODUODENOSCOPY  ~ 2014  . INGUINAL HERNIA REPAIR   1966   "? side"  . KNEE ARTHROPLASTY Left 02/06/2014   Procedure: COMPUTER ASSISTED TOTAL KNEE ARTHROPLASTY;  Surgeon: MMarybelle Killings MD;  [place]  Service: Orthopedics;  Laterality: Left;  Cemented Left Total Knee Arthroplasty  . KNEE ARTHROSCOPY Left 08/2016   scar tissue cleaned out  . LAPAROSCOPIC CHOLECYSTECTOMY  02/03/2017  . LEFT HEART CATHETERIZATION WITH CORONARY ANGIOGRAM N/A 01/29/2013   Procedure: LEFT HEART CATHETERIZATION WITH CORONARY ANGIOGRAM;  Surgeon: PThayer Headings MD;  [facility];  Service: Cardiovascular;  Laterality: N/A;  . MULTIPLE TOOTH EXTRACTIONS  09/2016  . REVERSE SHOULDER ARTHROPLASTY Left 07/31/2019   Procedure: LEFT REVERSE SHOULDER ARTHROPLASTY;  Surgeon: DMeredith Pel MD;  [place]  Service: Orthopedics;  Laterality: Left;  . SHOULDER ARTHROSCOPY Left 03/12/2013  . TOTAL KNEE ARTHROPLASTY Left 02/06/2014  . TOTAL KNEE ARTHROPLASTY Right 09/14/2017  . TOTAL KNEE ARTHROPLASTY Right 09/14/2017   Procedure: RIGHT TOTAL KNEE ARTHROPLASTY;  Surgeon: YMarybelle Killings MD;  [place]  Service: Orthopedics;  Laterality: Right;    There were no vitals filed for this visit.  Subjective Assessment - 11/05/19 0848    Subjective  got stuck trying to clean out a dryer vent this weekend; and is sore "all  over" today.    Pertinent History  PMH: rTSA Lt 07/31/19, Rt TKA, sleep apnea,HTN, cervical DDD,BPH,obesity    Diagnostic tests  "Left shoulder x-rays show a well fixed and aligned prosthesis with no complicating features."    Patient Stated Goals  do as much as I can with my shoulder, I know it wont be back to normal.    Currently in Pain?  No/denies    Pain Onset  More than a month ago                       [facility] Adult PT Treatment/Exercise - 11/05/19 0849      Self-Care   Self-Care  Other Self-Care Comments    Other Self-Care Comments   discussed current progress and expected outcomes of surgery.  pt still  working through likelihood he will be unable to return to work.  recommended he go to gym today or tomorrow to try new exercises      Exercises   Exercises  Elbow      Elbow Exercises   Elbow Flexion  Left;20 reps;Standing;Bar weights/barbell    Bar Weights/Barbell (Elbow Flexion)  Other (comment)   6#   Elbow Extension  Left;20 reps;Standing;Bar weights/barbell    Bar Weights/Barbell (Elbow Extension)  2 lbs      Shoulder Exercises: Standing   Flexion  Left;20 reps    Shoulder Flexion Weight (lbs)  2    ABduction  Left;20 reps;Weights    Shoulder ABduction Weight (lbs)  2    Other Standing Exercises  15# KB and box carry x 2 laps with min reports of discomfort    Other Standing Exercises  overhead press 1#; Lt 2x10      Shoulder Exercises: ROM/Strengthening   UBE (Upper Arm Bike)  2.5 min fwd, 2.5 min retro; L5               PT Short Term Goals - 09/24/19 1158      PT SHORT TERM GOAL #1   Title  independent with initial HEP. (target for STG 4 weeks 09/29/18)    Status  Achieved      PT SHORT TERM GOAL #2   Title  Improve Lt  shoulder PROM to [facility].    Status  Achieved      PT SHORT TERM GOAL #3   Title  Improve overall Lt shoulder strength to 3+ to -4 MMT    Status  Achieved        PT Long Term Goals - 11/05/19 3151      PT LONG TERM GOAL #1   Title  independent with advanced HEP (taget for all LTG 12 weeks 11/22/18)    Status  On-going      PT LONG TERM GOAL #2   Title  Improve Lt shoulder AROM to [facility].    Status  On-going      PT LONG TERM GOAL #3   Title  improve strength to overall 4+ to 5- MMT and be able to lift/carry up to 15 lbs to improve funciton.    Status  Partially Met      PT LONG TERM GOAL #4   Title  Reduce pain to overall less than 3/10 with ususal activities.    Status  Partially Met            Plan - 11/05/19 7616    Clinical Impression Statement  Pt has partially met 2 LTGs and feels ready for  transition to community fitness.   Will plan to follow up for 1 more visit since he is going to check out the gym before he returns.  Then will update HEP and check remaining goals.    Personal Factors and Comorbidities  Comorbidity 3+    Comorbidities  PMH: rTSA Lt 07/31/19, bilat TKA, sleep apnea,HTN, cervical DDD,BPH,obesity    Examination-Activity Limitations  Reach Overhead;Carry;Dressing;Lift    Examination-Participation Restrictions  Meal Prep;Cleaning;School;Community Activity;Driving;Laundry;Yard Work    [employer]chant [employer] officer  Evolving/Moderate complexity    Rehab Potential  Good    PT Frequency  2x / week    PT Duration  12 weeks    PT Treatment/Interventions  ADLs/Self Care Home Management;Cryotherapy;Electrical Stimulation;Iontophoresis 74m/ml Dexamethasone;Moist Heat;Ultrasound;Therapeutic activities;Therapeutic exercise;Neuromuscular re-education;Manual techniques;Scar mobilization;Passive range of motion;Dry needling;Joint Manipulations;Taping    PT Next Visit Plan  check on gym HEP, look at remaining goals, d/c - give tshirt    PT Home Exercise Plan  Access Code: [email or web address]   Consulted and Agree with Plan of Care  Patient       Patient will benefit from skilled therapeutic intervention in order to improve the following deficits and impairments:  Decreased activity tolerance, Decreased mobility, Decreased strength, Decreased scar mobility, Decreased range of motion, Postural dysfunction, Impaired flexibility, Increased muscle spasms, Increased fascial restricitons, Pain  Visit Diagnosis: Acute pain of left shoulder  Muscle weakness (generalized)  Stiffness of left shoulder, not elsewhere classified     Problem List Patient Active Problem List   Diagnosis Date Noted  . OA (osteoarthritis) of shoulder 07/31/2019  . Allergic rhinoconjunctivitis 01/03/2019  . Seasonal allergic rhinitis due to pollen 04/11/2018  . Vitamin D deficiency 12/21/2017  . Primary osteoarthritis of left shoulder  06/13/2017  . Sleep apnea 03/08/2017  . Hyperlipidemia with target LDL less than 100 12/22/2016  . Primary erectile dysfunction 11/18/2014  . Essential hypertension 07/15/2014  . DDD (degenerative disc disease), cervical 10/03/2013  . Esophageal ring, acquired 07/12/2013  . Crohn's ileitis - working dx 06/01/2013  . BPH (benign prostatic hyperplasia) 04/18/2013  . Pernicious anemia-B12 deficiency 04/18/2013  . Severe obesity (BMI >= 40) ([place] 12/21/2012  . GERD (gastroesophageal reflux disease) 12/21/2012  . Hyperglycemia 08/29/2012  . Routine general medical examination at a health care facility 08/29/2012      SLaureen Abrahams PT, DPT 11/05/19 9:24 AM     [facility] [address] [address]Phone: [phone]  Fax:  [phone] Name: CDIONNE ROSSAMRN: [id number] of Birth: [birth year]

## 2019-11-05 NOTE — Telephone Encounter (Signed)
LVM to inform patient we need paperwork re faxed.

## 2019-11-07 ENCOUNTER — Ambulatory Visit (INDEPENDENT_AMBULATORY_CARE_PROVIDER_SITE_OTHER): Payer: BC Managed Care – PPO | Admitting: Physical Therapy

## 2019-11-07 ENCOUNTER — Encounter: Payer: Self-pay | Admitting: Physical Therapy

## 2019-11-07 ENCOUNTER — Other Ambulatory Visit: Payer: Self-pay

## 2019-11-07 DIAGNOSIS — M6281 Muscle weakness (generalized): Secondary | ICD-10-CM | POA: Diagnosis not present

## 2019-11-07 DIAGNOSIS — M25512 Pain in left shoulder: Secondary | ICD-10-CM | POA: Diagnosis not present

## 2019-11-07 DIAGNOSIS — M25612 Stiffness of left shoulder, not elsewhere classified: Secondary | ICD-10-CM | POA: Diagnosis not present

## 2019-11-07 NOTE — Therapy (Signed)
[place], [place], [address] Phone: [phone]   Fax:  [phone]  Physical Therapy Treatment/Discharge Summary  Patient Details  Name: Taylor Schroeder MRN: [id number] Date of Birth: 01/23/1966 Referring Provider (PT): Magnant, Aldean Jewett, Meredith Pel, MD   Encounter Date: 11/07/2019  PT End of Session - 11/07/19 0854    Visit Number  11    Authorization Type  BC[employer]    PT Start Time  0831    PT Stop Time  7616    PT Time Calculation (min)  24 min    Activity Tolerance  Patient tolerated treatment well    Behavior During Therapy  [facility] for tasks assessed/performed       Past Medical History:  Diagnosis Date  . Arthritis    "qwhere" (02/03/2017)  . BPH (benign prostatic hyperplasia)   . Childhood asthma    "as a baby"  . Coronary artery disease Non-obstructive   a. nonobs cath in 2006;  b. 01/2013 Cath: LM nl, LAD 72m LCX nl, RCA non-dom, nl. EF 60-65%.  . Esophageal ring, acquired 07/12/2013  . GERD (gastroesophageal reflux disease)   . H/O hiatal hernia    "think so" (02/06/2014)  . Headache    "resolved w/BP control in 11/2015" (02/03/2017)  . High cholesterol   . History of kidney stones   . Hypertension   . Ileitis 2014   Suspected Crohn's, think not as time has gone on  . Obesity   . Pernicious anemia-B12 deficiency 04/18/2013  . PONV (postoperative nausea and vomiting) 02/03/2017; 08/2016  . Sleep apnea    cpap every night    Past Surgical History:  Procedure Laterality Date  . APPENDECTOMY    . CARDIAC CATHETERIZATION  01/2013   "results were clear"  . CARPAL TUNNEL RELEASE Right   . CHOLECYSTECTOMY N/A 02/03/2017   Procedure: LAPAROSCOPIC CHOLECYSTECTOMY;  Surgeon: WGreer Pickerel MD;  [place] [facility]  Service: General;  Laterality: N/A;  . COLONOSCOPY  ~ 2014  . COLONOSCOPY    . ESOPHAGOGASTRODUODENOSCOPY  ~ 2014  . INGUINAL HERNIA REPAIR  1966   "? side"  . KNEE ARTHROPLASTY Left  02/06/2014   Procedure: COMPUTER ASSISTED TOTAL KNEE ARTHROPLASTY;  Surgeon: MMarybelle Killings MD;  [place] [facility]  Service: Orthopedics;  Laterality: Left;  Cemented Left Total Knee Arthroplasty  . KNEE ARTHROSCOPY Left 08/2016   scar tissue cleaned out  . LAPAROSCOPIC CHOLECYSTECTOMY  02/03/2017  . LEFT HEART CATHETERIZATION WITH CORONARY ANGIOGRAM N/A 01/29/2013   Procedure: LEFT HEART CATHETERIZATION WITH CORONARY ANGIOGRAM;  Surgeon: PThayer Headings MD;  [facility];  Service: Cardiovascular;  Laterality: N/A;  . MULTIPLE TOOTH EXTRACTIONS  09/2016  . REVERSE SHOULDER ARTHROPLASTY Left 07/31/2019   Procedure: LEFT REVERSE SHOULDER ARTHROPLASTY;  Surgeon: DMeredith Pel MD;  [place] [facility]  Service: Orthopedics;  Laterality: Left;  . SHOULDER ARTHROSCOPY Left 03/12/2013  . TOTAL KNEE ARTHROPLASTY Left 02/06/2014  . TOTAL KNEE ARTHROPLASTY Right 09/14/2017  . TOTAL KNEE ARTHROPLASTY Right 09/14/2017   Procedure: RIGHT TOTAL KNEE ARTHROPLASTY;  Surgeon: YMarybelle Killings MD;  [place] [facility]  Service: Orthopedics;  Laterality: Right;    There were no vitals filed for this visit.  Subjective Assessment - 11/07/19 0835    Subjective  didn't have an opportunity to get to the gym as planned.  still feels ready for d/c today.    Pertinent History  PMH: rTSA Lt 07/31/19, Rt TKA, sleep  apnea,HTN, cervical DDD,BPH,obesity    Diagnostic tests  "Left shoulder x-rays show a well fixed and aligned prosthesis with no complicating features."    Patient Stated Goals  do as much as I can with my shoulder, I know it wont be back to normal.    Currently in Pain?  No/denies   no pain, just soreness from last session   Pain Onset  More than a month ago         [facility] PT Assessment - 11/07/19 0848      Assessment   Medical Diagnosis  S/P reverse total shoulder arthroplasty, left on 07/31/19    Referring Provider (PT)  Magnant, Aldean Jewett, Marlou Sa Tonna Corner, MD    Onset Date/Surgical  Date  07/31/19    Hand Dominance  Right    Next MD Visit  12/07/19      AROM   Left Shoulder Flexion  145 Degrees    Left Shoulder ABduction  115 Degrees    Left Shoulder Internal Rotation  --   FIR to Lt hip   Left Shoulder External Rotation  --   FER to C6     Strength   Left Shoulder Flexion  4/5    Left Shoulder ABduction  4/5    Left Shoulder Internal Rotation  3+/5    Left Shoulder External Rotation  1/5                   [facility] Adult PT Treatment/Exercise - 11/07/19 0838      Elbow Exercises   Elbow Flexion  20 reps;Standing;Bar weights/barbell;Both    Bar Weights/Barbell (Elbow Flexion)  --   6#   Elbow Extension  20 reps;Standing;Bar weights/barbell;Both    Bar Weights/Barbell (Elbow Extension)  Other (comment)   6#     Shoulder Exercises: Standing   Flexion  20 reps;Both    Shoulder Flexion Weight (lbs)  2    ABduction  20 reps;Weights;Both    Shoulder ABduction Weight (lbs)  2    Other Standing Exercises  overhead press 2#; both 2x10      Shoulder Exercises: ROM/Strengthening   UBE (Upper Arm Bike)  2.5 min fwd, 2.5 min retro; L6             PT Education - 11/07/19 0854    Education Details  gym program, progression of exercises    Person(s) Educated  Patient    Methods  Explanation;Demonstration;Handout    Comprehension  Verbalized understanding;Returned demonstration;Need further instruction       PT Short Term Goals - 09/24/19 1158      PT SHORT TERM GOAL #1   Title  independent with initial HEP. (target for STG 4 weeks 09/29/18)    Status  Achieved      PT SHORT TERM GOAL #2   Title  Improve Lt  shoulder PROM to [facility].    Status  Achieved      PT SHORT TERM GOAL #3   Title  Improve overall Lt shoulder strength to 3+ to -4 MMT    Status  Achieved        PT Long Term Goals - 11/07/19 0855      PT LONG TERM GOAL #1   Title  independent with advanced HEP (taget for all LTG 12 weeks 11/22/18)    Status  Achieved      PT LONG  TERM GOAL #2   Title  Improve Lt shoulder AROM to [facility].  Baseline  2/10: flexion, abduction and functional er met; still limited with FIR, and anticipate limitations wll persist    Status  Partially Met      PT LONG TERM GOAL #3   Title  improve strength to overall 4+ to 5- MMT and be able to lift/carry up to 15 lbs to improve funciton.    Status  Partially Met      PT LONG TERM GOAL #4   Title  Reduce pain to overall less than 3/10 with ususal activities.    Status  Partially Met            Plan - 11/07/19 0856    Clinical Impression Statement  Pt has met/partially met LTGs and would like to d/c to community fitness today.  Reinforced expected outcomes with rTSA and that he will not be able to perform heavy lifting activiites with this surgery.  Pt verbalized understanding.  Will d/c PT today.    Personal Factors and Comorbidities  Comorbidity 3+    Comorbidities  PMH: rTSA Lt 07/31/19, bilat TKA, sleep apnea,HTN, cervical DDD,BPH,obesity    Examination-Activity Limitations  Reach Overhead;Carry;Dressing;Lift    Examination-Participation Restrictions  Meal Prep;Cleaning;School;Community Activity;Driving;Laundry;Yard Work    [employer]  Evolving/Moderate complexity    Rehab Potential  Good    PT Frequency  2x / week    PT Duration  12 weeks    PT Treatment/Interventions  ADLs/Self Care Home Management;Cryotherapy;Electrical Stimulation;Iontophoresis 66m/ml Dexamethasone;Moist Heat;Ultrasound;Therapeutic activities;Therapeutic exercise;Neuromuscular re-education;Manual techniques;Scar mobilization;Passive range of motion;Dry needling;Joint Manipulations;Taping    PT Next Visit Plan  d/c PT today    PT Home Exercise Plan  Access Code: [email or web address]   Consulted and Agree with Plan of Care  Patient       Patient will benefit from skilled therapeutic intervention in order to improve the following deficits and impairments:  Decreased activity tolerance, Decreased  mobility, Decreased strength, Decreased scar mobility, Decreased range of motion, Postural dysfunction, Impaired flexibility, Increased muscle spasms, Increased fascial restricitons, Pain  Visit Diagnosis: Acute pain of left shoulder  Muscle weakness (generalized)  Stiffness of left shoulder, not elsewhere classified     Problem List Patient Active Problem List   Diagnosis Date Noted  . OA (osteoarthritis) of shoulder 07/31/2019  . Allergic rhinoconjunctivitis 01/03/2019  . Seasonal allergic rhinitis due to pollen 04/11/2018  . Vitamin D deficiency 12/21/2017  . 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 Sleep apnea 03/08/2017  . Hyperlipidemia with target LDL less than 100 12/22/2016  . Primary erectile dysfunction 11/18/2014  . Essential hypertension 07/15/2014  . DDD (degenerative disc disease), cervical 10/03/2013  . Esophageal ring, acquired 07/12/2013  . Crohn's ileitis - working dx 06/01/2013  . BPH (benign prostatic hyperplasia) 04/18/2013  . Pernicious anemia-B12 deficiency 04/18/2013  . Severe obesity (BMI >= 40) ([place] 12/21/2012  . GERD (gastroesophageal reflux disease) 12/21/2012  . Hyperglycemia 08/29/2012  . Routine general medical examination at a health care facility 08/29/2012      SLaureen Abrahams PT, DPT 11/07/19 8:58 AM    [facility] [address] [address]Phone: [phone]  Fax:  [phone] Name: CDHIREN AZIMIMRN: [id number] of Birth: 704/07/1966     PHYSICAL THERAPY DISCHARGE SUMMARY  Visits from Start of Care: 11  Current functional level related to goals / functional outcomes: See above   Remaining deficits: See above   Education / Equipment: HEP  Plan: Patient agrees to discharge.  Patient goals were partially met. Patient is being discharged due to being pleased with the current functional level.  ?????     Laureen Abrahams, PT,  DPT 11/07/19 8:58 AM [facility] Physical Therapy [address] [place], [place], [address] Phone: [phone]   Fax:  [phone]

## 2019-11-10 ENCOUNTER — Other Ambulatory Visit: Payer: Self-pay | Admitting: Internal Medicine

## 2019-11-10 DIAGNOSIS — D510 Vitamin B12 deficiency anemia due to intrinsic factor deficiency: Secondary | ICD-10-CM

## 2019-11-12 ENCOUNTER — Other Ambulatory Visit: Payer: Self-pay | Admitting: Internal Medicine

## 2019-11-12 DIAGNOSIS — I10 Essential (primary) hypertension: Secondary | ICD-10-CM

## 2019-11-12 MED ORDER — TELMISARTAN 80 MG PO TABS: 80.0000 mg | ORAL_TABLET | Freq: Every day | ORAL | 1 refills | Status: DC

## 2019-11-15 ENCOUNTER — Other Ambulatory Visit: Payer: Self-pay | Admitting: Internal Medicine

## 2019-11-15 DIAGNOSIS — D510 Vitamin B12 deficiency anemia due to intrinsic factor deficiency: Secondary | ICD-10-CM

## 2019-12-07 ENCOUNTER — Ambulatory Visit: Payer: BC Managed Care – PPO | Admitting: Orthopedic Surgery

## 2019-12-12 ENCOUNTER — Other Ambulatory Visit: Payer: Self-pay

## 2019-12-12 ENCOUNTER — Ambulatory Visit (INDEPENDENT_AMBULATORY_CARE_PROVIDER_SITE_OTHER): Payer: BC Managed Care – PPO | Admitting: Orthopedic Surgery

## 2019-12-12 DIAGNOSIS — Z96612 Presence of left artificial shoulder joint: Secondary | ICD-10-CM

## 2019-12-14 ENCOUNTER — Encounter: Payer: Self-pay | Admitting: Orthopedic Surgery

## 2019-12-14 NOTE — Progress Notes (Signed)
Office Visit Note   Patient: Taylor Schroeder           Date of Birth: 06-12-1966           MRN: [id number] Visit Date: 12/12/2019 Requested by: Janith Lima, MD [address] [place],  [place] [address] PCP: Janith Lima, MD  Subjective: Chief Complaint  Patient presents with  . Left Shoulder - Follow-up    HPI: Taylor Schroeder is now about 4 months out left reverse shoulder replacement.  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  I do think a 15 pound lifetime lifting restriction is indicated for this patient in order to preserve his shoulder function as long as possible.  He is only 54 years old.  He states that his pain and motion are significantly better but he just cannot manage the strengthening needs in order to function in his employment.              ROS: All systems reviewed are negative as they relate to the chief complaint within the history of present illness.  Patient denies  fevers or chills.   Assessment & Plan: Visit Diagnoses:  1. Status post reverse arthroplasty of left shoulder     Plan: Impression is left shoulder weakness following reverse shoulder replacement.  All in all his active and passive motion is excellent considering where he started.  I think that it would be reasonable to give him about 6-8 more weeks to try to restore as much functional recovery as possible prior to going back to work.  I do want to maintain 15 pound lifting limit on him permanently with that left shoulder.  Follow-up in 8 weeks for final check and release.  Follow-Up Instructions: Return in about 8 weeks (around 02/06/2020).   Orders:  No orders of the defined types were placed in this encounter.  No orders of the defined types were placed in this encounter.     Procedures: No procedures performed   Clinical Data: No additional findings.  Objective: Vital Signs: There were no vitals taken for this  visit.  Physical Exam:   Constitutional: Patient appears well-developed HEENT:  Head: Normocephalic Eyes:EOM are normal Neck: Normal range of motion Cardiovascular: Normal rate Pulmonary/chest: Effort normal Neurologic: Patient is alert Skin: Skin is warm Psychiatric: Patient has normal mood and affect    Ortho Exam: Ortho exam demonstrates good deltoid strength and actually pretty good subscap strength.  He does have passive and active motion above 90 degrees of abduction and forward flexion.  Motor sensory function to the hand is intact.  Mild biceps tenderness present.  Specialty Comments:  No specialty comments available.  Imaging: No results found.   PMFS History: Patient Active Problem List   Diagnosis Date Noted  . OA (osteoarthritis) of shoulder 07/31/2019  . Allergic rhinoconjunctivitis 01/03/2019  . Seasonal allergic rhinitis due to pollen 04/11/2018  . Vitamin D deficiency 12/21/2017  . 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 Sleep apnea 03/08/2017  . Hyperlipidemia with target LDL less than 100 12/22/2016  . Primary erectile dysfunction 11/18/2014  . Essential hypertension 07/15/2014  . DDD (degenerative disc disease), cervical 10/03/2013  . Esophageal ring, acquired 07/12/2013  . Crohn's ileitis - working dx 06/01/2013  . BPH (benign prostatic hyperplasia) 04/18/2013  . Pernicious anemia-B12 deficiency 04/18/2013  . Severe obesity (BMI >= 40) ([place]) 12/21/2012  . GERD (  gastroesophageal reflux disease) 12/21/2012  . Hyperglycemia 08/29/2012  . Routine general medical examination at a health care facility 08/29/2012   Past Medical History:  Diagnosis Date  . Arthritis    "qwhere" (02/03/2017)  . BPH (benign prostatic hyperplasia)   . Childhood asthma    "as a baby"  . Coronary artery disease Non-obstructive   a. nonobs cath in 2006;  b. 01/2013 Cath: LM nl, LAD 57m, LCX nl, RCA non-dom, nl. EF 60-65%.  . Esophageal ring, acquired  07/12/2013  . GERD (gastroesophageal reflux disease)   . H/O hiatal hernia    "think so" (02/06/2014)  . Headache    "resolved w/BP control in 11/2015" (02/03/2017)  . High cholesterol   . History of kidney stones   . Hypertension   . Ileitis 2014   Suspected Crohn's, think not as time has gone on  . Obesity   . Pernicious anemia-B12 deficiency 04/18/2013  . PONV (postoperative nausea and vomiting) 02/03/2017; 08/2016  . Sleep apnea    cpap every night    Family History  Problem Relation Age of Onset  . Hypertension Mother   . Cancer Father        type unknown  . COPD Other   . Emphysema Maternal Grandmother   . Emphysema Maternal Grandfather   . Other Paternal Grandmother        spinal cancer  . Alcohol abuse Neg Hx   . Diabetes Neg Hx   . Early death Neg Hx   . Heart disease Neg Hx   . Hyperlipidemia Neg Hx   . Kidney disease Neg Hx   . Stroke Neg Hx   . Colon cancer Neg Hx   . Stomach cancer Neg Hx   . Esophageal cancer Neg Hx   . Rectal cancer Neg Hx   . Liver cancer Neg Hx     Past Surgical History:  Procedure Laterality Date  . APPENDECTOMY    . CARDIAC CATHETERIZATION  01/2013   "results were clear"  . CARPAL TUNNEL RELEASE Right   . CHOLECYSTECTOMY N/A 02/03/2017   Procedure: LAPAROSCOPIC CHOLECYSTECTOMY;  Surgeon: Greer Pickerel, MD;  Location: [place];  Service: General;  Laterality: N/A;  . COLONOSCOPY  ~ 2014  . COLONOSCOPY    . ESOPHAGOGASTRODUODENOSCOPY  ~ 2014  . INGUINAL HERNIA REPAIR  1966   "? side"  . KNEE ARTHROPLASTY Left 02/06/2014   Procedure: COMPUTER ASSISTED TOTAL KNEE ARTHROPLASTY;  Surgeon: [name], MD;  Location: [place];  Service: Orthopedics;  Laterality: Left;  Cemented Left Total Knee Arthroplasty  . KNEE ARTHROSCOPY Left 08/2016   scar tissue cleaned out  . LAPAROSCOPIC CHOLECYSTECTOMY  02/03/2017  . LEFT HEART CATHETERIZATION WITH CORONARY ANGIOGRAM N/A 01/29/2013   Procedure: LEFT HEART CATHETERIZATION WITH CORONARY ANGIOGRAM;   Surgeon: Thayer Headings, MD;  [facility];  Service: Cardiovascular;  Laterality: N/A;  . MULTIPLE TOOTH EXTRACTIONS  09/2016  . REVERSE SHOULDER ARTHROPLASTY Left 07/31/2019   Procedure: LEFT REVERSE SHOULDER ARTHROPLASTY;  Surgeon: Meredith Pel, MD;  Location: [place];  Service: Orthopedics;  Laterality: Left;  . SHOULDER ARTHROSCOPY Left 03/12/2013  . TOTAL KNEE ARTHROPLASTY Left 02/06/2014  . TOTAL KNEE ARTHROPLASTY Right 09/14/2017  . TOTAL KNEE ARTHROPLASTY Right 09/14/2017   Procedure: RIGHT TOTAL KNEE ARTHROPLASTY;  Surgeon: [name], MD;  Location: [place];  Service: Orthopedics;  Laterality: Right;   Social History   Occupational History  . Occupation: MAINTENANCE    Employer: [employer]  Tobacco Use  . Smoking status: Former Smoker    Packs/day: 0.00    Years: 30.00    Pack years: 0.00    Quit date: 08/29/2008    Years since quitting: 11.2  . Smokeless tobacco: Never Used  Substance and Sexual Activity  . Alcohol use: No    Comment: 02/03/2017 "maybe 12 pack of beer all at one time but only once/year"; 02/06/2014 "couple beers a couple times/month"  . Drug use: No  . Sexual activity: Yes

## 2020-01-15 ENCOUNTER — Telehealth: Payer: Self-pay | Admitting: Orthopedic Surgery

## 2020-01-15 NOTE — Telephone Encounter (Signed)
done

## 2020-01-15 NOTE — Telephone Encounter (Signed)
Pt called in stating he has a trip on 01/17/20 and would like a note for PSA since he does have implants in his shoulders and knee; pt stated it would be fine for the note to be uploaded in mychart.   [phone]

## 2020-02-11 ENCOUNTER — Ambulatory Visit (INDEPENDENT_AMBULATORY_CARE_PROVIDER_SITE_OTHER): Payer: BC Managed Care – PPO | Admitting: Orthopedic Surgery

## 2020-02-11 ENCOUNTER — Other Ambulatory Visit: Payer: Self-pay

## 2020-02-11 DIAGNOSIS — Z96612 Presence of left artificial shoulder joint: Secondary | ICD-10-CM | POA: Diagnosis not present

## 2020-02-15 ENCOUNTER — Encounter: Payer: Self-pay | Admitting: Orthopedic Surgery

## 2020-02-15 NOTE — Progress Notes (Signed)
Office Visit Note   Patient: Taylor Schroeder           Date of Birth: Feb 03, 1966           MRN: [id number] Visit Date: 02/11/2020 Requested by: Janith Lima, MD [address] [place],  [place] [address] PCP: Janith Lima, MD  Subjective: Chief Complaint  Patient presents with  . Left Shoulder - Follow-up    HPI: Taylor Schroeder is a 54 year old patient who underwent left reverse shoulder replacement 07/31/2019.  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  He has filed for disability.  He tried to push the envelope in terms of lifting and moving things but it was not favorable to his replaced shoulder.  For functional ADLs he is doing well but for lifting and heavy work he is not able to do that safely or comfortably.              ROS: All systems reviewed are negative as they relate to the chief complaint within the history of present illness.  Patient denies  fevers or chills.   Assessment & Plan: Visit Diagnoses:  1. Status post reverse arthroplasty of left shoulder     Plan: Impression is well-functioning left reverse shoulder replacement.  He has good forward flexion and abduction.  That replacements not really designed for the type of work that he wants to go back to.  All this was discussed preoperatively.  Patient understands and he has found that out for himself essentially by pushing on the lip to some degree and having ensuing discomfort.  Nonetheless at this time I think it is important for him to consider some other type of employment endeavor that does not require heavy lifting with the left arm.  I will see him back as needed.  Follow-Up Instructions: No follow-ups on file.   Orders:  No orders of the defined types were placed in this encounter.  No orders of the defined types were placed in this encounter.     Procedures: No procedures performed   Clinical Data: No  additional findings.  Objective: Vital Signs: There were no vitals taken for this visit.  Physical Exam:   Constitutional: Patient appears well-developed HEENT:  Head: Normocephalic Eyes:EOM are normal Neck: Normal range of motion Cardiovascular: Normal rate Pulmonary/chest: Effort normal Neurologic: Patient is alert Skin: Skin is warm Psychiatric: Patient has normal mood and affect    Ortho Exam: Ortho exam demonstrates forward flexion of the left arm to about 150.  Isolated glenohumeral abduction is about 95.  He has pretty reasonable external rotation internal rotation strength.  Deltoid is functional.  Shoulder contour looks generally symmetric.  Specialty Comments:  No specialty comments available.  Imaging: No results found.   PMFS History: Patient Active Problem List   Diagnosis Date Noted  . OA (osteoarthritis) of shoulder 07/31/2019  . Allergic rhinoconjunctivitis 01/03/2019  . Seasonal allergic rhinitis due to pollen 04/11/2018  . Vitamin D deficiency 12/21/2017  . 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 Sleep apnea 03/08/2017  . Hyperlipidemia with target LDL less than 100 12/22/2016  . Primary erectile dysfunction 11/18/2014  . Essential hypertension 07/15/2014  . DDD (degenerative disc disease), cervical 10/03/2013  . Esophageal ring, acquired 07/12/2013  . Crohn's ileitis - working dx 06/01/2013  . BPH (benign prostatic hyperplasia) 04/18/2013  . Pernicious anemia-B12 deficiency 04/18/2013  .  Severe obesity (BMI >= 40) ([place]) 12/21/2012  . GERD (gastroesophageal reflux disease) 12/21/2012  . Hyperglycemia 08/29/2012  . Routine general medical examination at a health care facility 08/29/2012   Past Medical History:  Diagnosis Date  . Arthritis    "qwhere" (02/03/2017)  . BPH (benign prostatic hyperplasia)   . Childhood asthma    "as a baby"  . Coronary artery disease Non-obstructive   a. nonobs cath in 2006;  b. 01/2013 Cath: LM nl,  LAD 22m, LCX nl, RCA non-dom, nl. EF 60-65%.  . Esophageal ring, acquired 07/12/2013  . GERD (gastroesophageal reflux disease)   . H/O hiatal hernia    "think so" (02/06/2014)  . Headache    "resolved w/BP control in 11/2015" (02/03/2017)  . High cholesterol   . History of kidney stones   . Hypertension   . Ileitis 2014   Suspected Crohn's, think not as time has gone on  . Obesity   . Pernicious anemia-B12 deficiency 04/18/2013  . PONV (postoperative nausea and vomiting) 02/03/2017; 08/2016  . Sleep apnea    cpap every night    Family History  Problem Relation Age of Onset  . Hypertension Mother   . Cancer Father        type unknown  . COPD Other   . Emphysema Maternal Grandmother   . Emphysema Maternal Grandfather   . Other Paternal Grandmother        spinal cancer  . Alcohol abuse Neg Hx   . Diabetes Neg Hx   . Early death Neg Hx   . Heart disease Neg Hx   . Hyperlipidemia Neg Hx   . Kidney disease Neg Hx   . Stroke Neg Hx   . Colon cancer Neg Hx   . Stomach cancer Neg Hx   . Esophageal cancer Neg Hx   . Rectal cancer Neg Hx   . Liver cancer Neg Hx     Past Surgical History:  Procedure Laterality Date  . APPENDECTOMY    . CARDIAC CATHETERIZATION  01/2013   "results were clear"  . CARPAL TUNNEL RELEASE Right   . CHOLECYSTECTOMY N/A 02/03/2017   Procedure: LAPAROSCOPIC CHOLECYSTECTOMY;  Surgeon: Greer Pickerel, MD;  Location: [facility];  Service: General;  Laterality: N/A;  . COLONOSCOPY  ~ 2014  . COLONOSCOPY    . ESOPHAGOGASTRODUODENOSCOPY  ~ 2014  . INGUINAL HERNIA REPAIR  1966   "? side"  . KNEE ARTHROPLASTY Left 02/06/2014   Procedure: COMPUTER ASSISTED TOTAL KNEE ARTHROPLASTY;  Surgeon: [name], MD;  Location: [facility];  Service: Orthopedics;  Laterality: Left;  Cemented Left Total Knee Arthroplasty  . KNEE ARTHROSCOPY Left 08/2016   scar tissue cleaned out  . LAPAROSCOPIC CHOLECYSTECTOMY  02/03/2017  . LEFT HEART CATHETERIZATION WITH CORONARY ANGIOGRAM N/A  01/29/2013   Procedure: LEFT HEART CATHETERIZATION WITH CORONARY ANGIOGRAM;  Surgeon: Thayer Headings, MD;  Location[facility] CATH LAB;  Service: Cardiovascular;  Laterality: N/A;  . MULTIPLE TOOTH EXTRACTIONS  09/2016  . REVERSE SHOULDER ARTHROPLASTY Left 07/31/2019   Procedure: LEFT REVERSE SHOULDER ARTHROPLASTY;  Surgeon: Meredith Pel, MD;  Location: [facility];  Service: Orthopedics;  Laterality: Left;  . SHOULDER ARTHROSCOPY Left 03/12/2013  . TOTAL KNEE ARTHROPLASTY Left 02/06/2014  . TOTAL KNEE ARTHROPLASTY Right 09/14/2017  . TOTAL KNEE ARTHROPLASTY Right 09/14/2017   Procedure: RIGHT TOTAL KNEE ARTHROPLASTY;  Surgeon: [name], MD;  Location: [facility];  Service: Orthopedics;  Laterality: Right;   Social History   Occupational  History  . Occupation: MAINTENANCE    Employer: [employer]  Tobacco Use  . Smoking status: Former Smoker    Packs/day: 0.00    Years: 30.00    Pack years: 0.00    Quit date: 08/29/2008    Years since quitting: 11.4  . Smokeless tobacco: Never Used  Substance and Sexual Activity  . Alcohol use: No    Comment: 02/03/2017 "maybe 12 pack of beer all at one time but only once/year"; 02/06/2014 "couple beers a couple times/month"  . Drug use: No  . Sexual activity: Yes

## 2020-03-10 ENCOUNTER — Telehealth: Payer: Self-pay | Admitting: Orthopedic Surgery

## 2020-03-10 ENCOUNTER — Encounter: Payer: Self-pay | Admitting: Internal Medicine

## 2020-03-10 ENCOUNTER — Other Ambulatory Visit: Payer: Self-pay

## 2020-03-10 ENCOUNTER — Ambulatory Visit: Payer: BC Managed Care – PPO | Admitting: Internal Medicine

## 2020-03-10 ENCOUNTER — Ambulatory Visit (INDEPENDENT_AMBULATORY_CARE_PROVIDER_SITE_OTHER): Payer: BC Managed Care – PPO

## 2020-03-10 VITALS — BP 142/80 | HR 93 | Temp 98.3°F | Resp 16 | Ht 69.5 in | Wt 275.4 lb

## 2020-03-10 DIAGNOSIS — K8050 Calculus of bile duct without cholangitis or cholecystitis without obstruction: Secondary | ICD-10-CM | POA: Diagnosis not present

## 2020-03-10 DIAGNOSIS — R7989 Other specified abnormal findings of blood chemistry: Secondary | ICD-10-CM | POA: Diagnosis not present

## 2020-03-10 DIAGNOSIS — R10821 Right upper quadrant rebound abdominal tenderness: Secondary | ICD-10-CM | POA: Diagnosis not present

## 2020-03-10 LAB — HEPATIC FUNCTION PANEL
ALT: 100 U/L — ABNORMAL HIGH (ref 0–53)
AST: 49 U/L — ABNORMAL HIGH (ref 0–37)
Albumin: 4.4 g/dL (ref 3.5–5.2)
Alkaline Phosphatase: 59 U/L (ref 39–117)
Bilirubin, Direct: 0.2 mg/dL (ref 0.0–0.3)
Total Bilirubin: 0.8 mg/dL (ref 0.2–1.2)
Total Protein: 7.2 g/dL (ref 6.0–8.3)

## 2020-03-10 LAB — CBC WITH DIFFERENTIAL/PLATELET
Basophils Absolute: 0.1 10*3/uL (ref 0.0–0.1)
Basophils Relative: 0.7 % (ref 0.0–3.0)
Eosinophils Absolute: 0.2 10*3/uL (ref 0.0–0.7)
Eosinophils Relative: 2.6 % (ref 0.0–5.0)
HCT: 42.5 % (ref 39.0–52.0)
Hemoglobin: 14.6 g/dL (ref 13.0–17.0)
Lymphocytes Relative: 23.9 % (ref 12.0–46.0)
Lymphs Abs: 2.2 10*3/uL (ref 0.7–4.0)
MCHC: 34.4 g/dL (ref 30.0–36.0)
MCV: 89.9 fl (ref 78.0–100.0)
Monocytes Absolute: 0.8 10*3/uL (ref 0.1–1.0)
Monocytes Relative: 8.5 % (ref 3.0–12.0)
Neutro Abs: 5.9 10*3/uL (ref 1.4–7.7)
Neutrophils Relative %: 64.3 % (ref 43.0–77.0)
Platelets: 273 10*3/uL (ref 150.0–400.0)
RBC: 4.73 Mil/uL (ref 4.22–5.81)
RDW: 13.6 % (ref 11.5–15.5)
WBC: 9.2 10*3/uL (ref 4.0–10.5)

## 2020-03-10 LAB — BASIC METABOLIC PANEL
BUN: 18 mg/dL (ref 6–23)
CO2: 25 mEq/L (ref 19–32)
Calcium: 9.5 mg/dL (ref 8.4–10.5)
Chloride: 104 mEq/L (ref 96–112)
Creatinine, Ser: 1.04 mg/dL (ref 0.40–1.50)
GFR: 74.18 mL/min (ref >60.00)
Glucose, Bld: 119 mg/dL — ABNORMAL HIGH (ref 70–99)
Potassium: 4 mEq/L (ref 3.5–5.1)
Sodium: 137 mEq/L (ref 135–145)

## 2020-03-10 LAB — SEDIMENTATION RATE: Sed Rate: 11 mm/hr (ref 0–20)

## 2020-03-10 LAB — TRIGLYCERIDES: Triglycerides: 187 mg/dL — ABNORMAL HIGH (ref 0.0–149.0)

## 2020-03-10 LAB — LIPASE: Lipase: 29 U/L (ref 11.0–59.0)

## 2020-03-10 LAB — C-REACTIVE PROTEIN: CRP: <1 mg/dL (ref 0.5–20.0)

## 2020-03-10 LAB — AMYLASE: Amylase: 32 U/L (ref 27–131)

## 2020-03-10 MED ORDER — ONDANSETRON 8 MG PO TBDP: 8.0000 mg | ORAL_TABLET | Freq: Three times a day (TID) | ORAL | 1 refills | Status: DC | PRN

## 2020-03-10 MED ORDER — OXYCODONE HCL 5 MG PO TABS: 5.0000 mg | ORAL_TABLET | ORAL | 0 refills | Status: DC | PRN

## 2020-03-10 NOTE — Telephone Encounter (Signed)
Patient called asked if he can get a note for his employer stating he will be out of work until November. The number to contact patient is [phone]

## 2020-03-10 NOTE — Patient Instructions (Signed)
Abdominal Pain, Adult Pain in the abdomen (abdominal pain) can be caused by many things. Often, abdominal pain is not serious and it gets better with no treatment or by being treated at home. However, sometimes abdominal pain is serious. Your health care provider will ask questions about your medical history and do a physical exam to try to determine the cause of your abdominal pain. Follow these instructions at home:  Medicines  Take over-the-counter and prescription medicines only as told by your health care provider.  Do not take a laxative unless told by your health care provider. General instructions  Watch your condition for any changes.  Drink enough fluid to keep your urine pale yellow.  Keep all follow-up visits as told by your health care provider. This is important. Contact a health care provider if:  Your abdominal pain changes or gets worse.  You are not hungry or you lose weight without trying.  You are constipated or have diarrhea for more than 2-3 days.  You have pain when you urinate or have a bowel movement.  Your abdominal pain wakes you up at night.  Your pain gets worse with meals, after eating, or with certain foods.  You are vomiting and cannot keep anything down.  You have a fever.  You have blood in your urine. Get help right away if:  Your pain does not go away as soon as your health care provider told you to expect.  You cannot stop vomiting.  Your pain is only in areas of the abdomen, such as the right side or the left lower portion of the abdomen. Pain on the right side could be caused by appendicitis.  You have bloody or black stools, or stools that look like tar.  You have severe pain, cramping, or bloating in your abdomen.  You have signs of dehydration, such as: ? Dark urine, very little urine, or no urine. ? Cracked lips. ? Dry mouth. ? Sunken eyes. ? Sleepiness. ? Weakness.  You have trouble breathing or chest  pain. Summary  Often, abdominal pain is not serious and it gets better with no treatment or by being treated at home. However, sometimes abdominal pain is serious.  Watch your condition for any changes.  Take over-the-counter and prescription medicines only as told by your health care provider.  Contact a health care provider if your abdominal pain changes or gets worse.  Get help right away if you have severe pain, cramping, or bloating in your abdomen. This information is not intended to replace advice given to you by your health care provider. Make sure you discuss any questions you have with your health care provider. Document Revised: 01/22/2019 Document Reviewed: 01/22/2019 Elsevier Patient Education  2020 Elsevier Inc.  

## 2020-03-10 NOTE — Telephone Encounter (Signed)
Ok for this? 

## 2020-03-10 NOTE — Progress Notes (Signed)
Subjective:  Patient ID: Taylor Schroeder, male    DOB: [birth year]  Age: 54 y.o. MRN: [id number]  CC: Abdominal Pain  This visit occurred during the SARS-CoV-2 public health emergency.  Safety protocols were in place, including screening questions prior to the visit, additional usage of staff PPE, and extensive cleaning of exam room while observing appropriate contact time as indicated for disinfecting solutions.    HPI Taylor Schroeder presents for f/up - He complains of a 3-day history of right upper quadrant pain.  He describes it as an intermittent dull ache with a stabbing and sharp sensation that radiates into his back. He has had chills but he denies fever.  He has some nausea and diarrhea but no vomiting.  He denies melena or bright red blood per rectum.  He denies weight loss or loss of appetite.  The symptoms started 3 days ago [place] - he says he was indulging on food and alcohol.  He is status post cholecystectomy.  Outpatient Medications Prior to Visit  Medication Sig Dispense Refill  . aspirin 81 MG chewable tablet Chew 1 tablet (81 mg total) by mouth daily. 14 tablet 0  . cetirizine (ZYRTEC) 10 MG tablet Take 10 mg by mouth daily.    . [facility]S D3 50 MCG (2000 UT) CAPS TAKE 2 CAPSULES BY MOUTH EVERY DAY 180 capsule 1  . [facility]S VITAMIN B-12 2000 MCG TBCR TAKE 1 TABLET BY MOUTH EVERY DAY 90 tablet 1  . esomeprazole (NEXIUM) 40 MG capsule TAKE ONE CAPSULE BY MOUTH EVERY MORNING BEFORE BREAKFAST 90 capsule 2  . ibuprofen (ADVIL) 200 MG tablet Take 200-400 mg by mouth every 8 (eight) hours as needed for moderate pain.    . indapamide (LOZOL) 1.25 MG tablet TAKE 1 TABLET BY MOUTH EVERY DAY 90 tablet 0  . mometasone-formoterol (DULERA) 100-5 MCG/ACT AERO Inhale 2 puffs into the lungs 2 (two) times daily as needed for wheezing or shortness of breath.    . telmisartan (MICARDIS) 80 MG tablet Take 1 tablet (80 mg total) by mouth daily. 90 tablet 1  . oxyCODONE (OXY IR/ROXICODONE)  5 MG immediate release tablet Take 1 tablet (5 mg total) by mouth every 4 (four) hours as needed for moderate pain (pain score 4-6). 42 tablet 0   No facility-administered medications prior to visit.    ROS Review of Systems  Constitutional: Positive for chills and unexpected weight change (wt gain). Negative for diaphoresis, fatigue and fever.  HENT: Negative.  Negative for trouble swallowing and voice change.   Eyes: Negative.   Respiratory: Negative for cough, chest tightness, shortness of breath and wheezing.   Cardiovascular: Negative for chest pain, palpitations and leg swelling.  Gastrointestinal: Positive for abdominal pain, diarrhea and nausea. Negative for abdominal distention, blood in stool, constipation and vomiting.  Endocrine: Negative.   Genitourinary: Negative.  Negative for difficulty urinating, dysuria, frequency, hematuria and urgency.  Musculoskeletal: Negative.  Negative for back pain and neck pain.  Skin: Negative.  Negative for color change and rash.  Neurological: Negative.  Negative for dizziness, weakness, light-headedness and numbness.  Hematological: Negative for adenopathy. Does not bruise/bleed easily.  Psychiatric/Behavioral: Negative.     Objective:  BP (!) 142/80 (BP Location: Left Arm, Patient Position: Sitting, Cuff Size: Large)   Pulse 93   Temp 98.3 F (36.8 C) (Oral)   Resp 16   Ht 5' 9.5" (1.765 m)   Wt 275 lb 6 oz (124.9 kg)  SpO2 96%   BMI 40.08 kg/m   BP Readings from Last 3 Encounters:  03/10/20 (!) 142/80  08/01/19 138/88  07/23/19 (!) 136/96    Wt Readings from Last 3 Encounters:  03/10/20 275 lb 6 oz (124.9 kg)  09/14/19 253 lb (114.8 kg)  07/31/19 253 lb 1.6 oz (114.8 kg)    Physical Exam Vitals reviewed.  Constitutional:      General: He is not in acute distress.    Appearance: He is obese. He is not ill-appearing or toxic-appearing.  HENT:     Nose: Nose normal.     Mouth/Throat:     Mouth: Mucous membranes are  moist.  Eyes:     General: No scleral icterus.    Conjunctiva/sclera: Conjunctivae normal.  Cardiovascular:     Rate and Rhythm: Normal rate and regular rhythm.     Heart sounds: No murmur heard.   Pulmonary:     Effort: Pulmonary effort is normal.     Breath sounds: No stridor. No wheezing, rhonchi or rales.  Abdominal:     General: Abdomen is protuberant. Bowel sounds are decreased. There is no distension.     Palpations: Abdomen is soft. There is no hepatomegaly, splenomegaly or mass.     Tenderness: There is abdominal tenderness in the right upper quadrant, epigastric area, periumbilical area and left lower quadrant. There is guarding and rebound. There is no right CVA tenderness or left CVA tenderness.     Hernia: No hernia is present.  Musculoskeletal:        General: Normal range of motion.     Cervical back: Neck supple.     Right lower leg: No edema.     Left lower leg: No edema.  Lymphadenopathy:     Cervical: No cervical adenopathy.  Skin:    General: Skin is warm and dry.     Coloration: Skin is not pale.  Neurological:     General: No focal deficit present.     Mental Status: He is alert.  Psychiatric:        Mood and Affect: Mood normal.        Behavior: Behavior normal.     Lab Results  Component Value Date   WBC 9.2 03/10/2020   HGB 14.6 03/10/2020   HCT 42.5 03/10/2020   PLT 273.0 03/10/2020   GLUCOSE 119 (H) 03/10/2020   CHOL 198 05/03/2019   TRIG 187.0 (H) 03/10/2020   HDL 45.60 05/03/2019   LDLDIRECT 134.0 05/03/2019   LDLCALC 110 (H) 12/21/2017   ALT 100 (H) 03/10/2020   AST 49 (H) 03/10/2020   NA 137 03/10/2020   K 4.0 03/10/2020   CL 104 03/10/2020   CREATININE 1.04 03/10/2020   BUN 18 03/10/2020   CO2 25 03/10/2020   TSH 1.77 05/03/2019   PSA 2.67 12/21/2017   INR 0.95 09/07/2017   HGBA1C 5.6 05/03/2019   DG ABD ACUTE 2+V W 1V CHEST  Result Date: 03/10/2020 CLINICAL DATA:  Pain and nausea EXAM: DG ABDOMEN ACUTE W/ 1V CHEST  COMPARISON:  CT abdomen and pelvis July 05, 2018 FINDINGS: PA chest: Lungs are clear. Heart size and pulmonary vascularity are normal. No adenopathy. 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 Supine and upright abdomen: There is moderate stool in the colon. There is no bowel dilatation or air-fluid level to suggest bowel obstruction. No free air. There are surgical clips in the right upper abdomen. IMPRESSION: No bowel obstruction or free air.  Lungs clear. Electronically Signed   By: Lowella Grip III M.D.   On: 03/10/2020 11:13    Assessment & Plan:   Saliou was seen today for abdominal pain.  Diagnoses and all orders for this visit:  Right upper quadrant abdominal tenderness with rebound tenderness- He has a 3-day history of right upper quadrant pain with other systemic symptoms.  The only remarkable finding on his labs is a mild elevation in his LFTs.  His plain films are unremarkable.  He is status post cholecystectomy some concerned about choledocholithiasis.  I do not see any evidence of an infectious process at this time.  The liver enzyme elevation could be related to recent alcohol intake and/or fatty liver disease.  I recommended that he undergo an MRI of the abdomen with and without contrast to look for retained stones, infiltrating liver disease, NASH, liver mass, and cirrhosis. -     Lipase; Future -     Amylase; Future -     Basic metabolic panel; Future -     CBC with Differential/Platelet; Future -     Sedimentation rate; Future -     Hepatic function panel; Future -     Triglycerides; Future -     Urinalysis, Routine w reflex microscopic; Future -     C-reactive protein; Future -     DG ABD ACUTE 2+V W 1V CHEST; Future -     C-reactive protein -     Triglycerides -     Hepatic function panel -     Sedimentation rate -     CBC with Differential/Platelet -     Amylase -     Basic metabolic panel -     Lipase -     MR Abdomen W Wo Contrast; Future -      ondansetron (ZOFRAN ODT) 8 MG disintegrating tablet; Take 1 tablet (8 mg total) by mouth every 8 (eight) hours as needed for nausea or vomiting. -     oxyCODONE (OXY IR/ROXICODONE) 5 MG immediate release tablet; Take 1 tablet (5 mg total) by mouth every 4 (four) hours as needed for severe pain.  Elevated LFTs -     MR Abdomen W Wo Contrast; Future  Biliary colic- See above.  Will try to control his symptoms with ondansetron and oxycodone. -     ondansetron (ZOFRAN ODT) 8 MG disintegrating tablet; Take 1 tablet (8 mg total) by mouth every 8 (eight) hours as needed for nausea or vomiting. -     oxyCODONE (OXY IR/ROXICODONE) 5 MG immediate release tablet; Take 1 tablet (5 mg total) by mouth every 4 (four) hours as needed for severe pain.   I am having Vicente Serene D. Bansal "Darren" start on ondansetron and oxyCODONE. I am also having him maintain his ibuprofen, mometasone-formoterol, cetirizine, indapamide, aspirin, esomeprazole, CVS D3, telmisartan, and CVS Vitamin B-12.  Meds ordered this encounter  Medications  . ondansetron (ZOFRAN ODT) 8 MG disintegrating tablet    Sig: Take 1 tablet (8 mg total) by mouth every 8 (eight) hours as needed for nausea or vomiting.    Dispense:  20 tablet    Refill:  1  . oxyCODONE (OXY IR/ROXICODONE) 5 MG immediate release tablet    Sig: Take 1 tablet (5 mg total) by mouth every 4 (four) hours as needed for severe pain.    Dispense:  30 tablet    Refill:  0   I spent 60 minutes in preparing to see the patient by  review of recent labs, imaging and procedures, obtaining and reviewing separately obtained history, communicating with the patient and family or caregiver, ordering medications, tests or procedures, and documenting clinical information in the EHR including the differential Dx, treatment, and any further evaluation and other management of 1. Right upper quadrant abdominal tenderness with rebound tenderness 2. Elevated LFTs 3. Biliary colic    Follow-up:  Return in about 1 week (around 03/17/2020).  Scarlette Calico, MD

## 2020-03-11 ENCOUNTER — Telehealth: Payer: Self-pay | Admitting: Orthopedic Surgery

## 2020-03-11 NOTE — Telephone Encounter (Signed)
Patient called.   He is needing a note taking him out of work until at least November. Will come and pick it up himself.   Call back: [phone]

## 2020-03-11 NOTE — Telephone Encounter (Signed)
Please below. Patient calling again for this. Stated his employer is trying to get him approved for extended leave. States his employer can keep him on extended leave through November. Please advise. Thanks.

## 2020-03-12 ENCOUNTER — Other Ambulatory Visit: Payer: Self-pay | Admitting: Internal Medicine

## 2020-03-12 ENCOUNTER — Ambulatory Visit
Admission: RE | Admit: 2020-03-12 | Discharge: 2020-03-12 | Disposition: A | Discharge status: Home / Self Care | Payer: BC Managed Care – PPO | Source: Ambulatory Visit | Attending: Internal Medicine | Admitting: Internal Medicine

## 2020-03-12 ENCOUNTER — Other Ambulatory Visit: Payer: Self-pay

## 2020-03-12 DIAGNOSIS — R10821 Right upper quadrant rebound abdominal tenderness: Secondary | ICD-10-CM

## 2020-03-12 DIAGNOSIS — K760 Fatty (change of) liver, not elsewhere classified: Secondary | ICD-10-CM | POA: Insufficient documentation

## 2020-03-12 DIAGNOSIS — R7989 Other specified abnormal findings of blood chemistry: Secondary | ICD-10-CM

## 2020-03-12 DIAGNOSIS — K7581 Nonalcoholic steatohepatitis (NASH): Secondary | ICD-10-CM

## 2020-03-12 HISTORY — DX: Fatty (change of) liver, not elsewhere classified: K76.0

## 2020-03-12 MED ORDER — PIOGLITAZONE HCL 15 MG PO TABS: 15.0000 mg | ORAL_TABLET | Freq: Every day | ORAL | 1 refills | Status: DC

## 2020-03-12 MED ORDER — GADOBENATE DIMEGLUMINE 529 MG/ML IV SOLN
20.0000 mL | Freq: Once | INTRAVENOUS | Status: AC | PRN
  Administered 2020-03-12: 20 mL via INTRAVENOUS

## 2020-03-12 NOTE — Telephone Encounter (Signed)
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  Will have permanent lifting restrictions.  This will affect the left arm permanently.

## 2020-03-12 NOTE — Telephone Encounter (Signed)
Note done. Tried calling patient advise done. No answer. LMVM advising Patient has mychart as well so he should get an alert through mychart. I advised him to call if he wanted to pick up a copy at the front desk.

## 2020-03-14 ENCOUNTER — Telehealth: Payer: Self-pay | Admitting: Orthopedic Surgery

## 2020-03-14 NOTE — Telephone Encounter (Signed)
Pt called stating his job needs to have Dr.Dean's signature on it; pt would like CB to discuss further. Pt states is time critical.  [phone]

## 2020-03-14 NOTE — Telephone Encounter (Signed)
I put copy at front desk patient will pick up

## 2020-05-16 ENCOUNTER — Telehealth: Payer: BC Managed Care – PPO | Admitting: Family

## 2020-05-19 ENCOUNTER — Ambulatory Visit: Payer: BC Managed Care – PPO | Admitting: Internal Medicine

## 2020-05-19 ENCOUNTER — Encounter: Payer: Self-pay | Admitting: Internal Medicine

## 2020-05-19 ENCOUNTER — Other Ambulatory Visit: Payer: Self-pay

## 2020-05-19 VITALS — BP 126/76 | HR 78 | Temp 97.8°F | Resp 16 | Ht 69.5 in | Wt 280.0 lb

## 2020-05-19 DIAGNOSIS — G4482 Headache associated with sexual activity: Secondary | ICD-10-CM | POA: Insufficient documentation

## 2020-05-19 DIAGNOSIS — R4781 Slurred speech: Secondary | ICD-10-CM | POA: Diagnosis not present

## 2020-05-19 MED ORDER — NURTEC 75 MG PO TBDP: 1.0000 | ORAL_TABLET | Freq: Every day | ORAL | 0 refills | Status: DC | PRN

## 2020-05-19 NOTE — Patient Instructions (Signed)

## 2020-05-19 NOTE — Progress Notes (Addendum)
Subjective:  Patient ID: Taylor Schroeder, male    DOB: 30-Dec-1965  Age: 54 y.o. MRN: [id number]  CC: Headache  This visit occurred during the SARS-CoV-2 public health emergency.  Safety protocols were in place, including screening questions prior to the visit, additional usage of staff PPE, and extensive cleaning of exam room while observing appropriate contact time as indicated for disinfecting solutions.    HPI EMIEL KIELTY presents for f/up - He has a history of migraine headaches but about 3 months ago he developed a new headache syndrome.  He describes an intense, stabbing, posterior, midline headache that is associated with sexual activity and orgasm.  He said that during one of the headache episodes he called his mother and she thought his speech was slurred.  He has also had some visual disturbance.  He does not experience nausea, paresthesias, or ataxia.  He has not taken anything for the headaches.  Outpatient Medications Prior to Visit  Medication Sig Dispense Refill  . CVS D3 50 MCG (2000 UT) CAPS TAKE 2 CAPSULES BY MOUTH EVERY DAY 180 capsule 1  . CVS VITAMIN B-12 2000 MCG TBCR TAKE 1 TABLET BY MOUTH EVERY DAY 90 tablet 1  . esomeprazole (NEXIUM) 40 MG capsule TAKE ONE CAPSULE BY MOUTH EVERY MORNING BEFORE BREAKFAST 90 capsule 2  . ibuprofen (ADVIL) 200 MG tablet Take 200-400 mg by mouth every 8 (eight) hours as needed for moderate pain.    . indapamide (LOZOL) 1.25 MG tablet TAKE 1 TABLET BY MOUTH EVERY DAY 90 tablet 0  . mometasone-formoterol (DULERA) 100-5 MCG/ACT AERO Inhale 2 puffs into the lungs 2 (two) times daily as needed for wheezing or shortness of breath.    . telmisartan (MICARDIS) 80 MG tablet Take 1 tablet (80 mg total) by mouth daily. 90 tablet 1  . pioglitazone (ACTOS) 15 MG tablet Take 1 tablet (15 mg total) by mouth daily. 90 tablet 1  . aspirin 81 MG chewable tablet Chew 1 tablet (81 mg total) by mouth daily. 14 tablet 0  . cetirizine (ZYRTEC) 10 MG tablet  Take 10 mg by mouth daily.    . ondansetron (ZOFRAN ODT) 8 MG disintegrating tablet Take 1 tablet (8 mg total) by mouth every 8 (eight) hours as needed for nausea or vomiting. 20 tablet 1  . oxyCODONE (OXY IR/ROXICODONE) 5 MG immediate release tablet Take 1 tablet (5 mg total) by mouth every 4 (four) hours as needed for severe pain. 30 tablet 0   No facility-administered medications prior to visit.    ROS Review of Systems  Constitutional: Positive for unexpected weight change (wt gain). Negative for appetite change, chills, diaphoresis, fatigue and fever.  HENT: Negative.  Negative for sore throat and trouble swallowing.   Eyes: Positive for photophobia and visual disturbance. Negative for pain and itching.  Respiratory: Negative for cough, chest tightness, shortness of breath and wheezing.   Cardiovascular: Negative for chest pain, palpitations and leg swelling.  Gastrointestinal: Negative for abdominal pain, constipation, diarrhea, nausea and vomiting.  Genitourinary: Negative.  Negative for difficulty urinating.  Musculoskeletal: Positive for arthralgias and neck pain. Negative for back pain, gait problem and myalgias.  Skin: Negative.  Negative for color change, pallor and rash.  Neurological: Positive for speech difficulty and headaches. Negative for dizziness, tremors, seizures, syncope, weakness, light-headedness and numbness.  Hematological: Negative for adenopathy. Does not bruise/bleed easily.  Psychiatric/Behavioral: Negative.     Objective:  BP 126/76 (BP Location: Left Arm, Patient Position: Sitting, Cuff  Size: Large)   Pulse 78   Temp 97.8 F (36.6 C) (Oral)   Resp 16   Ht 5' 9.5" (1.765 m)   Wt 280 lb (127 kg)   SpO2 97%   BMI 40.76 kg/m   BP Readings from Last 3 Encounters:  05/19/20 126/76  03/10/20 (!) 142/80  08/01/19 138/88    Wt Readings from Last 3 Encounters:  05/19/20 280 lb (127 kg)  03/10/20 275 lb 6 oz (124.9 kg)  09/14/19 253 lb (114.8 kg)     Physical Exam Vitals reviewed.  Constitutional:      General: He is not in acute distress.    Appearance: He is well-developed. He is not toxic-appearing or diaphoretic.  Eyes:     General: No visual field deficit or scleral icterus.    Extraocular Movements: Extraocular movements intact.     Pupils: Pupils are equal, round, and reactive to light. Pupils are equal.  Cardiovascular:     Rate and Rhythm: Normal rate and regular rhythm.     Heart sounds: No murmur heard.   Pulmonary:     Effort: Pulmonary effort is normal.     Breath sounds: No stridor. No wheezing, rhonchi or rales.  Abdominal:     Palpations: Abdomen is soft.  Musculoskeletal:        General: Normal range of motion.     Cervical back: Normal range of motion and neck supple. No rigidity.  Skin:    General: Skin is warm and dry.     Coloration: Skin is not pale.  Neurological:     General: No focal deficit present.     Mental Status: He is alert.     Cranial Nerves: No cranial nerve deficit, dysarthria or facial asymmetry.     Sensory: Sensation is intact.     Motor: No weakness.     Coordination: Coordination is intact. Romberg sign negative. Coordination normal.     Gait: Gait is intact. Gait normal.     Deep Tendon Reflexes: Babinski sign absent on the right side. Babinski sign absent on the left side.     Reflex Scores:      Tricep reflexes are 0 on the right side and 0 on the left side.      Bicep reflexes are 0 on the right side and 0 on the left side.      Brachioradialis reflexes are 0 on the right side and 0 on the left side.      Patellar reflexes are 2+ on the right side and 2+ on the left side.      Achilles reflexes are 0 on the right side. Psychiatric:        Mood and Affect: Mood normal.        Behavior: Behavior normal.     Lab Results  Component Value Date   WBC 9.2 03/10/2020   HGB 14.6 03/10/2020   HCT 42.5 03/10/2020   PLT 273.0 03/10/2020   GLUCOSE 119 (H) 03/10/2020   CHOL  198 05/03/2019   TRIG 187.0 (H) 03/10/2020   HDL 45.60 05/03/2019   LDLDIRECT 134.0 05/03/2019   LDLCALC 110 (H) 12/21/2017   ALT 100 (H) 03/10/2020   AST 49 (H) 03/10/2020   NA 137 03/10/2020   K 4.0 03/10/2020   CL 104 03/10/2020   CREATININE 1.04 03/10/2020   BUN 18 03/10/2020   CO2 25 03/10/2020   TSH 1.77 05/03/2019   PSA 2.67 12/21/2017   INR 0.95 09/07/2017  HGBA1C 5.6 05/03/2019    MR Abdomen W Wo Contrast  Result Date: 03/12/2020 CLINICAL DATA:  Right upper quadrant pain. Fatty liver/steatohepatitis EXAM: MRI ABDOMEN WITHOUT AND WITH CONTRAST TECHNIQUE: Multiplanar multisequence MR imaging of the abdomen was performed both before and after the administration of intravenous contrast. CONTRAST:  23mL MULTIHANCE GADOBENATE DIMEGLUMINE 529 MG/ML IV SOLN COMPARISON:  CT AP 07/05/2018 FINDINGS: Lower chest: No acute findings. Hepatobiliary: There is diffuse hepatic steatosis. No liver mass identified. Status post cholecystectomy. No biliary ductal dilatation. Pancreas: No pancreatic inflammation, main duct dilatation or mass identified. Spleen:  Within normal limits in size and appearance. Adrenals/Urinary Tract: Normal appearance of the adrenal glands. The kidneys are unremarkable. No mass or hydronephrosis. Stomach/Bowel: Visualized portions within the abdomen are unremarkable. Vascular/Lymphatic: No pathologically enlarged lymph nodes identified. Aortic atherosclerosis. No abdominal aortic aneurysm demonstrated. Other:  None. Musculoskeletal: No suspicious bone lesions identified. IMPRESSION: 1. No acute findings within the abdomen. 2. Diffuse hepatic steatosis. 3.  Aortic Atherosclerosis (ICD10-I70.0). Electronically Signed   By: Kerby Moors M.D.   On: 03/12/2020 08:46    Assessment & Plan:   Herby was seen today for headache.  Diagnoses and all orders for this visit:  Coital headache- This is probably a migraine variant.  His neuro exam is normal. Since the headaches are  associated with sexual activity I recommended that he premedicate with rimegepant about an hour before sexual activity.  He has new symptoms including BV and slurred speech so I have asked him to undergo an MRI of the brain to see if there is a bleed, mass, tumor, or NPH.  Will also screen for aneurysm with an MR angiogram. -     MR Brain W Wo Contrast; Future -     Rimegepant Sulfate (NURTEC) 75 MG TBDP; Take 1 tablet by mouth daily as needed. -     MR Angiogram Head Wo Contrast; Future  Slurred speech- See above. -     MR Brain W Wo Contrast; Future -     MR Angiogram Head Wo Contrast; Future   I have discontinued Vicente Serene D. Byrnes "Darren"'s cetirizine, aspirin, ondansetron, and oxyCODONE. I am also having him start on Nurtec. Additionally, I am having him maintain his ibuprofen, mometasone-formoterol, indapamide, esomeprazole, CVS D3, telmisartan, CVS Vitamin B-12, and pioglitazone.  Meds ordered this encounter  Medications  . Rimegepant Sulfate (NURTEC) 75 MG TBDP    Sig: Take 1 tablet by mouth daily as needed.    Dispense:  16 tablet    Refill:  0     Follow-up: Return in about 4 weeks (around 06/16/2020).  Scarlette Calico, MD

## 2020-05-26 ENCOUNTER — Other Ambulatory Visit: Payer: Self-pay | Admitting: Internal Medicine

## 2020-05-26 DIAGNOSIS — I10 Essential (primary) hypertension: Secondary | ICD-10-CM

## 2020-05-28 ENCOUNTER — Other Ambulatory Visit: Payer: Self-pay | Admitting: Internal Medicine

## 2020-06-11 ENCOUNTER — Ambulatory Visit
Admission: RE | Admit: 2020-06-11 | Discharge: 2020-06-11 | Disposition: A | Discharge status: Home / Self Care | Payer: BC Managed Care – PPO | Source: Ambulatory Visit | Attending: Internal Medicine | Admitting: Internal Medicine

## 2020-06-11 DIAGNOSIS — G4482 Headache associated with sexual activity: Secondary | ICD-10-CM

## 2020-06-11 DIAGNOSIS — R4781 Slurred speech: Secondary | ICD-10-CM

## 2020-06-11 MED ORDER — GADOBUTROL 1 MMOL/ML IV SOLN
10.0000 mL | Freq: Once | INTRAVENOUS | Status: AC | PRN
  Administered 2020-06-11: 10 mL via INTRAVENOUS

## 2020-07-02 ENCOUNTER — Ambulatory Visit: Payer: Self-pay

## 2020-07-02 ENCOUNTER — Ambulatory Visit (INDEPENDENT_AMBULATORY_CARE_PROVIDER_SITE_OTHER): Payer: BC Managed Care – PPO | Admitting: Orthopaedic Surgery

## 2020-07-02 ENCOUNTER — Encounter: Payer: Self-pay | Admitting: Orthopaedic Surgery

## 2020-07-02 VITALS — BP 126/87 | HR 75 | Ht 69.0 in | Wt 280.0 lb

## 2020-07-02 DIAGNOSIS — G8929 Other chronic pain: Secondary | ICD-10-CM

## 2020-07-02 DIAGNOSIS — M545 Low back pain, unspecified: Secondary | ICD-10-CM | POA: Diagnosis not present

## 2020-07-02 DIAGNOSIS — M542 Cervicalgia: Secondary | ICD-10-CM

## 2020-07-02 NOTE — Progress Notes (Signed)
Office Visit Note   Patient: Taylor Schroeder           Date of Birth: Mar 04, 1966           MRN: [id number] Visit Date: 07/02/2020              Requested by: Janith Lima, MD [address] [place],  [place] [address] PCP: Janith Lima, MD   Assessment & Plan: Visit Diagnoses:  1. Neck pain   2. Chronic midline low back pain without sciatica     Plan: Patient is to work on weight loss with BMI 41 to help unload his back to improve his symptoms.  Current weight is 280 at 5 feet 9.  We reviewed x-rays today and he does have some spondylosis in the cervical spine but is not having significant radicular symptoms currently.  If these progress he can return.  Follow-Up Instructions: No follow-ups on file.   Orders:  Orders Placed This Encounter  Procedures  . XR Cervical Spine 2 or 3 views  . XR Lumbar Spine 2-3 Views   No orders of the defined types were placed in this encounter.     Procedures: No procedures performed   Clinical Data: No additional findings.   Subjective: Chief Complaint  Patient presents with  . Neck - Pain  . Lower Back - Pain    HPI 54 year old male returns states he has been having some neck pain and some low back pain.  He has noticed some numbness in his hand.  Backaches with activities.  He continues to have some problems with the shoulder but did not want to consider reverse total shoulder arthroplasty due to lifting restrictions and his current activities.  Previous cervical MRI showed foraminal narrowing C3-4 and C5-6 moderate degree.  Does not think his neck symptoms have really progressed since 2015.  He did heating and air work for many years.  Knee arthroplasties 2015 2018 continue to do well.  He states he has had some numbness in his left hand.  He has been using ibuprofen.  Review of Systems all other systems negative is obtained HPI.   Objective: Vital Signs: BP 126/87 (BP Location: Left Arm, Patient Position: Sitting, Cuff  Size: Large)   Pulse 75   Ht 5\' 9"  (1.753 m)   Wt 280 lb (127 kg)   BMI 41.35 kg/m   Physical Exam Constitutional:      Appearance: He is well-developed.  HENT:     Head: Normocephalic and atraumatic.  Eyes:     Pupils: Pupils are equal, round, and reactive to light.  Neck:     Thyroid: No thyromegaly.     Trachea: No tracheal deviation.  Cardiovascular:     Rate and Rhythm: Normal rate.  Pulmonary:     Effort: Pulmonary effort is normal.     Breath sounds: No wheezing.  Abdominal:     General: Bowel sounds are normal.     Palpations: Abdomen is soft.  Skin:    General: Skin is warm and dry.     Capillary Refill: Capillary refill takes less than 2 seconds.  Neurological:     Mental Status: He is alert and oriented to person, place, and time.  Psychiatric:        Behavior: Behavior normal.        Thought Content: Thought content normal.        Judgment: Judgment normal.     Ortho Exam healed knee incisions.  Patient is able to heel and toe walk negative logroll of the hips.  Specialty Comments:  No specialty comments available.  Imaging: No results found.   PMFS History: Patient Active Problem List   Diagnosis Date Noted  . Slurred speech 05/19/2020  . Coital headache 05/19/2020  . Nonalcoholic steatohepatitis (NASH) 03/12/2020  . Right upper quadrant abdominal tenderness with rebound tenderness 03/10/2020  . OA (osteoarthritis) of shoulder 07/31/2019  . Allergic rhinoconjunctivitis 01/03/2019  . Seasonal allergic rhinitis due to pollen 04/11/2018  . Vitamin D deficiency 12/21/2017  . 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 Sleep apnea 03/08/2017  . Hyperlipidemia with target LDL less than 100 12/22/2016  . Primary erectile dysfunction 11/18/2014  . Essential hypertension 07/15/2014  . DDD (degenerative disc disease), cervical 10/03/2013  . Esophageal ring, acquired 07/12/2013  . Crohn's ileitis - working dx 06/01/2013  . BPH (benign  prostatic hyperplasia) 04/18/2013  . Pernicious anemia-B12 deficiency 04/18/2013  . Severe obesity (BMI >= 40) ([facility]) 12/21/2012  . GERD (gastroesophageal reflux disease) 12/21/2012  . Hyperglycemia 08/29/2012  . Routine general medical examination at a health care facility 08/29/2012   Past Medical History:  Diagnosis Date  . Arthritis    "qwhere" (02/03/2017)  . BPH (benign prostatic hyperplasia)   . Childhood asthma    "as a baby"  . Coronary artery disease Non-obstructive   a. nonobs cath in 2006;  b. 01/2013 Cath: LM nl, LAD 15m, LCX nl, RCA non-dom, nl. EF 60-65%.  . Esophageal ring, acquired 07/12/2013  . GERD (gastroesophageal reflux disease)   . H/O hiatal hernia    "think so" (02/06/2014)  . Headache    "resolved w/BP control in 11/2015" (02/03/2017)  . High cholesterol   . History of kidney stones   . Hypertension   . Ileitis 2014   Suspected Crohn's, think not as time has gone on  . Obesity   . Pernicious anemia-B12 deficiency 04/18/2013  . PONV (postoperative nausea and vomiting) 02/03/2017; 08/2016  . Sleep apnea    cpap every night    Family History  Problem Relation Age of Onset  . Hypertension Mother   . Cancer Father        type unknown  . COPD Other   . Emphysema Maternal Grandmother   . Emphysema Maternal Grandfather   . Other Paternal Grandmother        spinal cancer  . Alcohol abuse Neg Hx   . Diabetes Neg Hx   . Early death Neg Hx   . Heart disease Neg Hx   . Hyperlipidemia Neg Hx   . Kidney disease Neg Hx   . Stroke Neg Hx   . Colon cancer Neg Hx   . Stomach cancer Neg Hx   . Esophageal cancer Neg Hx   . Rectal cancer Neg Hx   . Liver cancer Neg Hx     Past Surgical History:  Procedure Laterality Date  . APPENDECTOMY    . CARDIAC CATHETERIZATION  01/2013   "results were clear"  . CARPAL TUNNEL RELEASE Right   . CHOLECYSTECTOMY N/A 02/03/2017   Procedure: LAPAROSCOPIC CHOLECYSTECTOMY;  Surgeon: Greer Pickerel, MD;  Location: [facility];  Service:  General;  Laterality: N/A;  . COLONOSCOPY  ~ 2014  . COLONOSCOPY    . ESOPHAGOGASTRODUODENOSCOPY  ~ 2014  . INGUINAL HERNIA REPAIR  1966   "? side"  . KNEE ARTHROPLASTY Left 02/06/2014   Procedure: COMPUTER ASSISTED TOTAL KNEE ARTHROPLASTY;  Surgeon: [name], MD;  Location: [facility];  Service: Orthopedics;  Laterality: Left;  Cemented Left Total Knee Arthroplasty  . KNEE ARTHROSCOPY Left 08/2016   scar tissue cleaned out  . LAPAROSCOPIC CHOLECYSTECTOMY  02/03/2017  . LEFT HEART CATHETERIZATION WITH CORONARY ANGIOGRAM N/A 01/29/2013   Procedure: LEFT HEART CATHETERIZATION WITH CORONARY ANGIOGRAM;  Surgeon: Thayer Headings, MD;  Location: [facility] CATH LAB;  Service: Cardiovascular;  Laterality: N/A;  . MULTIPLE TOOTH EXTRACTIONS  09/2016  . REVERSE SHOULDER ARTHROPLASTY Left 07/31/2019   Procedure: LEFT REVERSE SHOULDER ARTHROPLASTY;  Surgeon: Meredith Pel, MD;  Location: [facility];  Service: Orthopedics;  Laterality: Left;  . SHOULDER ARTHROSCOPY Left 03/12/2013  . TOTAL KNEE ARTHROPLASTY Left 02/06/2014  . TOTAL KNEE ARTHROPLASTY Right 09/14/2017  . TOTAL KNEE ARTHROPLASTY Right 09/14/2017   Procedure: RIGHT TOTAL KNEE ARTHROPLASTY;  Surgeon: [name], MD;  Location: [facility];  Service: Orthopedics;  Laterality: Right;   Social History   Occupational History  . Occupation: MAINTENANCE    Employer: [employer]  Tobacco Use  . Smoking status: Former Smoker    Packs/day: 0.00    Years: 30.00    Pack years: 0.00    Quit date: 08/29/2008    Years since quitting: 11.8  . Smokeless tobacco: Never Used  Vaping Use  . Vaping Use: Never used  Substance and Sexual Activity  . Alcohol use: No    Comment: 02/03/2017 "maybe 12 pack of beer all at one time but only once/year"; 02/06/2014 "couple beers a couple times/month"  . Drug use: No  . Sexual activity: Yes

## 2020-08-19 ENCOUNTER — Other Ambulatory Visit: Payer: Self-pay | Admitting: Internal Medicine

## 2020-08-20 ENCOUNTER — Ambulatory Visit: Payer: BC Managed Care – PPO | Admitting: Internal Medicine

## 2020-08-20 ENCOUNTER — Other Ambulatory Visit: Payer: Self-pay

## 2020-08-20 ENCOUNTER — Encounter: Payer: Self-pay | Admitting: Internal Medicine

## 2020-08-20 VITALS — BP 128/86 | HR 77 | Temp 98.4°F | Resp 16 | Ht 69.0 in | Wt 288.0 lb

## 2020-08-20 DIAGNOSIS — L2084 Intrinsic (allergic) eczema: Secondary | ICD-10-CM

## 2020-08-20 DIAGNOSIS — E785 Hyperlipidemia, unspecified: Secondary | ICD-10-CM

## 2020-08-20 DIAGNOSIS — Z0001 Encounter for general adult medical examination with abnormal findings: Secondary | ICD-10-CM

## 2020-08-20 DIAGNOSIS — D510 Vitamin B12 deficiency anemia due to intrinsic factor deficiency: Secondary | ICD-10-CM

## 2020-08-20 DIAGNOSIS — K7581 Nonalcoholic steatohepatitis (NASH): Secondary | ICD-10-CM

## 2020-08-20 DIAGNOSIS — I10 Essential (primary) hypertension: Secondary | ICD-10-CM

## 2020-08-20 DIAGNOSIS — E559 Vitamin D deficiency, unspecified: Secondary | ICD-10-CM

## 2020-08-20 DIAGNOSIS — R739 Hyperglycemia, unspecified: Secondary | ICD-10-CM

## 2020-08-20 DIAGNOSIS — Z0000 Encounter for general adult medical examination without abnormal findings: Secondary | ICD-10-CM

## 2020-08-20 DIAGNOSIS — K2100 Gastro-esophageal reflux disease with esophagitis, without bleeding: Secondary | ICD-10-CM

## 2020-08-20 MED ORDER — FLUOCINONIDE 0.05 % EX CREA: 1.0000 "application " | TOPICAL_CREAM | Freq: Two times a day (BID) | CUTANEOUS | 1 refills | Status: DC

## 2020-08-20 MED ORDER — LEVOCETIRIZINE DIHYDROCHLORIDE 5 MG PO TABS: 10.0000 mg | ORAL_TABLET | Freq: Every evening | ORAL | 0 refills | Status: DC

## 2020-08-20 NOTE — Progress Notes (Signed)
Subjective:  Patient ID: Taylor Schroeder, male    DOB: Feb 13, 1966  Age: 54 y.o. MRN: [id number]  CC: Rash  This visit occurred during the SARS-CoV-2 public health emergency.  Safety protocols were in place, including screening questions prior to the visit, additional usage of staff PPE, and extensive cleaning of exam room while observing appropriate contact time as indicated for disinfecting solutions.        HPI TAB RYLEE presents for concerns about a rash above his left shoulder blade. It is itchy and has not improved with OTC cortisone cream.  Outpatient Medications Prior to Visit  Medication Sig Dispense Refill  . CVS D3 50 MCG (2000 UT) CAPS TAKE 2 CAPSULES BY MOUTH EVERY DAY 180 capsule 1  . CVS VITAMIN B-12 2000 MCG TBCR TAKE 1 TABLET BY MOUTH EVERY DAY 90 tablet 1  . esomeprazole (NEXIUM) 40 MG capsule TAKE ONE CAPSULE BY MOUTH EVERY MORNING BEFORE BREAKFAST 90 capsule 0  . ibuprofen (ADVIL) 200 MG tablet Take 200-400 mg by mouth every 8 (eight) hours as needed for moderate pain.    . indapamide (LOZOL) 1.25 MG tablet TAKE 1 TABLET BY MOUTH EVERY DAY 90 tablet 0  . mometasone-formoterol (DULERA) 100-5 MCG/ACT AERO Inhale 2 puffs into the lungs 2 (two) times daily as needed for wheezing or shortness of breath.    . pioglitazone (ACTOS) 15 MG tablet Take 1 tablet (15 mg total) by mouth daily. 90 tablet 1  . Rimegepant Sulfate (NURTEC) 75 MG TBDP Take 1 tablet by mouth daily as needed. 16 tablet 0  . telmisartan (MICARDIS) 80 MG tablet TAKE 1 TABLET BY MOUTH EVERY DAY 90 tablet 1   No facility-administered medications prior to visit.    ROS Review of Systems  Constitutional: Negative.   Skin: Positive for rash. Negative for color change.    Objective:  BP 128/86   Pulse 77   Temp 98.4 F (36.9 C) (Oral)   Resp 16   Ht 5\' 9"  (1.753 m)   Wt 288 lb (130.6 kg)   SpO2 98%   BMI 42.53 kg/m   BP Readings from Last 3 Encounters:  08/20/20 128/86  07/02/20 126/87   05/19/20 126/76    Wt Readings from Last 3 Encounters:  08/20/20 288 lb (130.6 kg)  07/02/20 280 lb (127 kg)  05/19/20 280 lb (127 kg)    Physical Exam Vitals reviewed.  Constitutional:      Appearance: Normal appearance.  Musculoskeletal:       Arms:  Skin:    Findings: Erythema and rash present. No abscess, ecchymosis or laceration. Rash is papular and scaling. Rash is not crusting, nodular, purpuric, pustular, urticarial or vesicular.  Neurological:     Mental Status: He is alert.     Lab Results  Component Value Date   WBC 9.2 03/10/2020   HGB 14.6 03/10/2020   HCT 42.5 03/10/2020   PLT 273.0 03/10/2020   GLUCOSE 119 (H) 03/10/2020   CHOL 198 05/03/2019   TRIG 187.0 (H) 03/10/2020   HDL 45.60 05/03/2019   LDLDIRECT 134.0 05/03/2019   LDLCALC 110 (H) 12/21/2017   ALT 100 (H) 03/10/2020   AST 49 (H) 03/10/2020   NA 137 03/10/2020   K 4.0 03/10/2020   CL 104 03/10/2020   CREATININE 1.04 03/10/2020   BUN 18 03/10/2020   CO2 25 03/10/2020   TSH 1.77 05/03/2019   PSA 2.67 12/21/2017   INR 0.95 09/07/2017   HGBA1C 5.6 05/03/2019  MR Angiogram Head Wo Contrast  Result Date: 06/12/2020 CLINICAL DATA:  Chronic headaches EXAM: MRI HEAD WITHOUT AND WITH CONTRAST MRA HEAD WITHOUT CONTRAST TECHNIQUE: Multiplanar, multiecho pulse sequences of the brain and surrounding structures were obtained without and with intravenous contrast. Angiographic images of the head were obtained using MRA technique without contrast. CONTRAST:  47mL GADAVIST GADOBUTROL 1 MMOL/ML IV SOLN COMPARISON:  None. FINDINGS: MRI HEAD FINDINGS Brain: No acute infarct, acute hemorrhage or extra-axial collection. Normal white matter signal. Normal volume of CSF spaces. No chronic microhemorrhage. Normal midline structures. Vascular: Normal flow voids. Skull and upper cervical spine: Normal marrow signal. Sinuses/Orbits: Right mastoid effusion. Small amount of left mastoid fluid. Paranasal sinuses are  clear. Other: None. MRA HEAD FINDINGS POSTERIOR CIRCULATION: --Vertebral arteries: Normal V4 segments. --Inferior cerebellar arteries: Normal. --Basilar artery: Normal. --Superior cerebellar arteries: Normal. --Posterior cerebral arteries: Normal.  Patent right P-comm ANTERIOR CIRCULATION: --Intracranial internal carotid arteries: Normal. --Anterior cerebral arteries (ACA): Normal. Both A1 segments are present. Patent anterior communicating artery (a-comm). --Middle cerebral arteries (MCA): Normal. IMPRESSION: 1. Normal MRI of the brain. 2. Normal intracranial MRA. 3. Right mastoid effusion. Electronically Signed   By: Ulyses Jarred M.D.   On: 06/12/2020 03:17   MR Brain W Wo Contrast  Result Date: 06/12/2020 CLINICAL DATA:  Chronic headaches EXAM: MRI HEAD WITHOUT AND WITH CONTRAST MRA HEAD WITHOUT CONTRAST TECHNIQUE: Multiplanar, multiecho pulse sequences of the brain and surrounding structures were obtained without and with intravenous contrast. Angiographic images of the head were obtained using MRA technique without contrast. CONTRAST:  45mL GADAVIST GADOBUTROL 1 MMOL/ML IV SOLN COMPARISON:  None. FINDINGS: MRI HEAD FINDINGS Brain: No acute infarct, acute hemorrhage or extra-axial collection. Normal white matter signal. Normal volume of CSF spaces. No chronic microhemorrhage. Normal midline structures. Vascular: Normal flow voids. Skull and upper cervical spine: Normal marrow signal. Sinuses/Orbits: Right mastoid effusion. Small amount of left mastoid fluid. Paranasal sinuses are clear. Other: None. MRA HEAD FINDINGS POSTERIOR CIRCULATION: --Vertebral arteries: Normal V4 segments. --Inferior cerebellar arteries: Normal. --Basilar artery: Normal. --Superior cerebellar arteries: Normal. --Posterior cerebral arteries: Normal.  Patent right P-comm ANTERIOR CIRCULATION: --Intracranial internal carotid arteries: Normal. --Anterior cerebral arteries (ACA): Normal. Both A1 segments are present. Patent anterior  communicating artery (a-comm). --Middle cerebral arteries (MCA): Normal. IMPRESSION: 1. Normal MRI of the brain. 2. Normal intracranial MRA. 3. Right mastoid effusion. Electronically Signed   By: Ulyses Jarred M.D.   On: 06/12/2020 03:17    Assessment & Plan:   Lynton was seen today for rash.  Diagnoses and all orders for this visit:  Encounter for general adult medical examination with abnormal findings  Routine general medical examination at a health care facility  Vitamin D deficiency  Essential hypertension  Gastroesophageal reflux disease with esophagitis without hemorrhage  Nonalcoholic steatohepatitis (NASH)  Pernicious anemia-B12 deficiency  Hyperlipidemia with target LDL less than 100  Hyperglycemia  Intrinsic eczema- Will treat with a potent topical steroid and oral antihistamine. -     fluocinonide cream (LIDEX) 0.05 %; Apply 1 application topically 2 (two) times daily. -     levocetirizine (XYZAL) 5 MG tablet; Take 2 tablets (10 mg total) by mouth every evening.   I am having Vicente Serene D. Jesson "Darren" start on fluocinonide cream and levocetirizine. I am also having him maintain his ibuprofen, mometasone-formoterol, indapamide, CVS D3, CVS Vitamin B-12, pioglitazone, Nurtec, telmisartan, and esomeprazole.  Meds ordered this encounter  Medications  . fluocinonide cream (LIDEX) 0.05 %    Sig: Apply  1 application topically 2 (two) times daily.    Dispense:  120 g    Refill:  1  . levocetirizine (XYZAL) 5 MG tablet    Sig: Take 2 tablets (10 mg total) by mouth every evening.    Dispense:  180 tablet    Refill:  0     Follow-up: Return in about 3 weeks (around 09/10/2020).  Scarlette Calico, MD

## 2020-08-20 NOTE — Patient Instructions (Signed)

## 2020-08-28 ENCOUNTER — Telehealth: Payer: Self-pay

## 2020-08-28 NOTE — Telephone Encounter (Signed)
Please advise. Thanks.  

## 2020-08-28 NOTE — Telephone Encounter (Signed)
Patient called he stated his disability form is dated until 07/30/2020 he wants to get his disability extended. He stated he cant go back to his job he only has a 15 pound limit for his left shoulder. Patient wants to know what he should do next. CB:[phone]

## 2020-09-02 NOTE — Telephone Encounter (Signed)
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

## 2020-09-03 ENCOUNTER — Other Ambulatory Visit: Payer: Self-pay

## 2020-09-03 ENCOUNTER — Ambulatory Visit (INDEPENDENT_AMBULATORY_CARE_PROVIDER_SITE_OTHER): Payer: BC Managed Care – PPO | Admitting: Orthopedic Surgery

## 2020-09-03 ENCOUNTER — Telehealth: Payer: Self-pay | Admitting: Orthopedic Surgery

## 2020-09-03 ENCOUNTER — Ambulatory Visit (INDEPENDENT_AMBULATORY_CARE_PROVIDER_SITE_OTHER): Payer: BC Managed Care – PPO

## 2020-09-03 DIAGNOSIS — M25512 Pain in left shoulder: Secondary | ICD-10-CM

## 2020-09-03 NOTE — Telephone Encounter (Signed)
IC  Has appointment this morning and will discuss at that time.

## 2020-09-03 NOTE — Telephone Encounter (Signed)
Received $50.00 check  and 2 disability/FMLA forms     Forwarding to [facility]

## 2020-09-06 ENCOUNTER — Encounter: Payer: Self-pay | Admitting: Orthopedic Surgery

## 2020-09-06 NOTE — Progress Notes (Signed)
Office Visit Note   Patient: Taylor Schroeder           Date of Birth: [birth year]           MRN: [id number] Visit Date: 09/03/2020 Requested by: Janith Lima, MD [address] [place],  [place] [address] PCP: Janith Lima, MD  Subjective: Chief Complaint  Patient presents with  . Left Shoulder - Pain    HPI: [name] is a 54 year old patient underwent left reverse shoulder replacement earlier this year.  Reports some recent mechanical symptoms in the shoulder.  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  This is not unexpected.  Denies any fevers or chills.              ROS: All systems reviewed are negative as they relate to the chief complaint within the history of present illness.  Patient denies  fevers or chills.   Assessment & Plan: Visit Diagnoses:  1. Left shoulder pain, unspecified chronicity     Plan: Impression is well-functioning left reverse shoulder replacement.  Patient has very functional range of motion above shoulder level for activities of daily living.  He will be advised to maintain his lifting limit of 15 pounds with that left arm.  Essentially he needs to make this shoulder last as long as he does.  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  His form is filled out today stating essentially the same.  Follow-up with me as needed.  Follow-Up Instructions: Return if symptoms worsen or fail to improve.   Orders:  Orders Placed This Encounter  Procedures  . XR Shoulder Left   No orders of the defined types were placed in this encounter.     Procedures: No procedures performed   Clinical Data: No additional findings.  Objective: Vital Signs: There were no vitals taken for this visit.  Physical Exam:   Constitutional: Patient appears well-developed HEENT:  Head: Normocephalic Eyes:EOM are normal Neck: Normal range of motion Cardiovascular: Normal rate Pulmonary/chest: Effort normal Neurologic:  Patient is alert Skin: Skin is warm Psychiatric: Patient has normal mood and affect    Ortho Exam: Ortho exam demonstrates full active and passive range of motion of the right shoulder.  Left shoulder has forward flexion abduction both above 90 degrees.  Internal/external rotation strength is 5+ out of 5 with subscap strength and about 4 out of 5 with external rotation.  No popping or grinding with passive range of motion of that left shoulder.  Incision is intact.  No warmth.  Specialty Comments:  No specialty comments available.  Imaging: No results found.   PMFS History: Patient Active Problem List   Diagnosis Date Noted  . Encounter for general adult medical examination with abnormal findings 08/20/2020  . Intrinsic eczema 08/20/2020  . Slurred speech 05/19/2020  . Coital headache 05/19/2020  . Nonalcoholic steatohepatitis (NASH) 03/12/2020  . OA (osteoarthritis) of shoulder 07/31/2019  . Allergic rhinoconjunctivitis 01/03/2019  . Seasonal allergic rhinitis due to pollen 04/11/2018  . Vitamin D deficiency 12/21/2017  . 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 Sleep apnea 03/08/2017  . Hyperlipidemia with target LDL less than 100 12/22/2016  . Primary erectile dysfunction 11/18/2014  . Essential hypertension 07/15/2014  . DDD (degenerative disc disease), cervical 10/03/2013  . Esophageal ring, acquired 07/12/2013  . Crohn's ileitis - working dx 06/01/2013  . BPH (benign prostatic hyperplasia) 04/18/2013  .  Pernicious anemia-B12 deficiency 04/18/2013  . Severe obesity (BMI >= 40) ([place]) 12/21/2012  . GERD (gastroesophageal reflux disease) 12/21/2012  . Hyperglycemia 08/29/2012  . Routine general medical examination at a health care facility 08/29/2012   Past Medical History:  Diagnosis Date  . Arthritis    "qwhere" (02/03/2017)  . BPH (benign prostatic hyperplasia)   . Childhood asthma    "as a baby"  . Coronary artery disease Non-obstructive   a. nonobs  cath in 2006;  b. 01/2013 Cath: LM nl, LAD 32m, LCX nl, RCA non-dom, nl. EF 60-65%.  . Esophageal ring, acquired 07/12/2013  . GERD (gastroesophageal reflux disease)   . H/O hiatal hernia    "think so" (02/06/2014)  . Headache    "resolved w/BP control in 11/2015" (02/03/2017)  . High cholesterol   . History of kidney stones   . Hypertension   . Ileitis 2014   Suspected Crohn's, think not as time has gone on  . Obesity   . Pernicious anemia-B12 deficiency 04/18/2013  . PONV (postoperative nausea and vomiting) 02/03/2017; 08/2016  . Sleep apnea    cpap every night    Family History  Problem Relation Age of Onset  . Hypertension Mother   . Cancer Father        type unknown  . COPD Other   . Emphysema Maternal Grandmother   . Emphysema Maternal Grandfather   . Other Paternal Grandmother        spinal cancer  . Alcohol abuse Neg Hx   . Diabetes Neg Hx   . Early death Neg Hx   . Heart disease Neg Hx   . Hyperlipidemia Neg Hx   . Kidney disease Neg Hx   . Stroke Neg Hx   . Colon cancer Neg Hx   . Stomach cancer Neg Hx   . Esophageal cancer Neg Hx   . Rectal cancer Neg Hx   . Liver cancer Neg Hx     Past Surgical History:  Procedure Laterality Date  . APPENDECTOMY    . CARDIAC CATHETERIZATION  01/2013   "results were clear"  . CARPAL TUNNEL RELEASE Right   . CHOLECYSTECTOMY N/A 02/03/2017   Procedure: LAPAROSCOPIC CHOLECYSTECTOMY;  Surgeon: Greer Pickerel, MD;  Location: [facility];  Service: General;  Laterality: N/A;  . COLONOSCOPY  ~ 2014  . COLONOSCOPY    . ESOPHAGOGASTRODUODENOSCOPY  ~ 2014  . INGUINAL HERNIA REPAIR  1966   "? side"  . KNEE ARTHROPLASTY Left 02/06/2014   Procedure: COMPUTER ASSISTED TOTAL KNEE ARTHROPLASTY;  Surgeon: [name], MD;  Location: [facility];  Service: Orthopedics;  Laterality: Left;  Cemented Left Total Knee Arthroplasty  . KNEE ARTHROSCOPY Left 08/2016   scar tissue cleaned out  . LAPAROSCOPIC CHOLECYSTECTOMY  02/03/2017  . LEFT HEART  CATHETERIZATION WITH CORONARY ANGIOGRAM N/A 01/29/2013   Procedure: LEFT HEART CATHETERIZATION WITH CORONARY ANGIOGRAM;  Surgeon: Thayer Headings, MD;  Location[facility];  Service: Cardiovascular;  Laterality: N/A;  . MULTIPLE TOOTH EXTRACTIONS  09/2016  . REVERSE SHOULDER ARTHROPLASTY Left 07/31/2019   Procedure: LEFT REVERSE SHOULDER ARTHROPLASTY;  Surgeon: Meredith Pel, MD;  Location: [facility];  Service: Orthopedics;  Laterality: Left;  . SHOULDER ARTHROSCOPY Left 03/12/2013  . TOTAL KNEE ARTHROPLASTY Left 02/06/2014  . TOTAL KNEE ARTHROPLASTY Right 09/14/2017  . TOTAL KNEE ARTHROPLASTY Right 09/14/2017   Procedure: RIGHT TOTAL KNEE ARTHROPLASTY;  Surgeon: [name], MD;  Location: [facility];  Service: Orthopedics;  Laterality: Right;  Social History   Occupational History  . Occupation: MAINTENANCE    Employer: [employer]  Tobacco Use  . Smoking status: Former Smoker    Packs/day: 0.00    Years: 30.00    Pack years: 0.00    Quit date: 08/29/2008    Years since quitting: 12.0  . Smokeless tobacco: Never Used  Vaping Use  . Vaping Use: Never used  Substance and Sexual Activity  . Alcohol use: No    Comment: 02/03/2017 "maybe 12 pack of beer all at one time but only once/year"; 02/06/2014 "couple beers a couple times/month"  . Drug use: No  . Sexual activity: Yes

## 2020-09-30 ENCOUNTER — Telehealth: Payer: Self-pay | Admitting: Internal Medicine

## 2020-09-30 NOTE — Telephone Encounter (Signed)
Patient tested positive for covid this morning at a urgent care in [place] and they prescribed him prednisone but he is still feeling pretty bad so he was wondering if he should get the monoclonal antibodies.

## 2020-10-06 NOTE — Telephone Encounter (Signed)
pls refer him to the [facility] on [address]

## 2020-10-07 NOTE — Telephone Encounter (Signed)
Patient calling back states he was starting to feel better but now his fever is back and he feels really weak and he looses his breath when he gets up and walks around. Informed the patient we would be referring him to the covid clinic on [address] but he would like to talk to someone about what that is and what they can do to help him. [phone]

## 2020-10-07 NOTE — Telephone Encounter (Signed)
Called pt, LVM.   

## 2020-10-08 ENCOUNTER — Ambulatory Visit (INDEPENDENT_AMBULATORY_CARE_PROVIDER_SITE_OTHER): Payer: BC Managed Care – PPO | Admitting: Medical

## 2020-10-08 ENCOUNTER — Telehealth: Payer: Self-pay | Admitting: Family

## 2020-10-08 ENCOUNTER — Encounter (HOSPITAL_COMMUNITY): Payer: Self-pay

## 2020-10-08 ENCOUNTER — Other Ambulatory Visit: Payer: Self-pay

## 2020-10-08 ENCOUNTER — Emergency Department (HOSPITAL_COMMUNITY)
Admission: EM | Admit: 2020-10-08 | Discharge: 2020-10-08 | Disposition: A | Discharge status: Home / Self Care | Payer: BC Managed Care – PPO | Attending: Emergency Medicine | Admitting: Emergency Medicine

## 2020-10-08 ENCOUNTER — Ambulatory Visit (HOSPITAL_COMMUNITY)
Admission: RE | Admit: 2020-10-08 | Discharge: 2020-10-08 | Disposition: A | Discharge status: Home / Self Care | Payer: BC Managed Care – PPO | Source: Ambulatory Visit | Attending: Family | Admitting: Family

## 2020-10-08 ENCOUNTER — Emergency Department (HOSPITAL_COMMUNITY): Payer: BC Managed Care – PPO

## 2020-10-08 ENCOUNTER — Telehealth (INDEPENDENT_AMBULATORY_CARE_PROVIDER_SITE_OTHER): Payer: BC Managed Care – PPO | Admitting: Family

## 2020-10-08 VITALS — BP 110/71 | HR 118 | Temp 99.5°F

## 2020-10-08 DIAGNOSIS — Z5321 Procedure and treatment not carried out due to patient leaving prior to being seen by health care provider: Secondary | ICD-10-CM | POA: Insufficient documentation

## 2020-10-08 DIAGNOSIS — U071 COVID-19: Secondary | ICD-10-CM

## 2020-10-08 DIAGNOSIS — E860 Dehydration: Secondary | ICD-10-CM | POA: Diagnosis not present

## 2020-10-08 DIAGNOSIS — R000 Tachycardia, unspecified: Secondary | ICD-10-CM

## 2020-10-08 DIAGNOSIS — R059 Cough, unspecified: Secondary | ICD-10-CM | POA: Diagnosis not present

## 2020-10-08 DIAGNOSIS — R110 Nausea: Secondary | ICD-10-CM | POA: Diagnosis not present

## 2020-10-08 DIAGNOSIS — R0602 Shortness of breath: Secondary | ICD-10-CM

## 2020-10-08 MED ORDER — AZITHROMYCIN 250 MG PO TABS: ORAL_TABLET | ORAL | 0 refills | Status: DC

## 2020-10-08 MED ORDER — ALBUTEROL SULFATE HFA 108 (90 BASE) MCG/ACT IN AERS: 2.0000 | INHALATION_SPRAY | Freq: Four times a day (QID) | RESPIRATORY_TRACT | 0 refills | Status: DC | PRN

## 2020-10-08 MED ORDER — ONDANSETRON 8 MG PO TBDP: 8.0000 mg | ORAL_TABLET | Freq: Three times a day (TID) | ORAL | 0 refills | Status: DC | PRN

## 2020-10-08 MED ORDER — AMOXICILLIN-POT CLAVULANATE 875-125 MG PO TABS: 1.0000 | ORAL_TABLET | Freq: Two times a day (BID) | ORAL | 0 refills | Status: DC

## 2020-10-08 MED ORDER — PREDNISONE 20 MG PO TABS: 20.0000 mg | ORAL_TABLET | Freq: Every day | ORAL | 0 refills | Status: DC

## 2020-10-08 NOTE — Telephone Encounter (Signed)
I am so sorry to hear this- unfortunately I don't have another option for him to get a CXR right now. According to Dr. Ronnald Ramp message, they were going to put in a referral to the post-COVId clinic so hopefully they can see him quickly and they should be able to do a CXR there. In the interim, please take the medication we discussed today.  He will be eligible for in office visit with Dr. Ronnald Ramp after the 14th so I would recommend that he go ahead and schedule in office visit for re-check early next week.

## 2020-10-08 NOTE — Progress Notes (Signed)
[facility]   Subjective:  Taylor Schroeder is a 55 y.o. male who presents for respiratory illness.    PCP: Janith Lima, MD  Here for COVID positive.  Symptoms began January 2.  He tested positive on January 4.  For the first week he had mild symptoms but by Saturday he felt worse.  Symptoms have included cough, nausea, shortness of breath, decreased appetite, loss of smell and taste, headaches, body aches, fevers.  Initially his fever was 1-1.5 last week but in the last few days low-grade 100.3 for example.  Current symptoms include shortness of breath, everything hurts, headache, nausea, had loose stools a few days ago but that cleared up.  No vomiting but feels quite nauseated.  No wheezing.  Does have fatigue.  He knows he is probably not drinking enough fluids maybe 50 ounces per day.  He felt panicky last night sleeping in the chair.  That prompted appointment virtually with his primary care today.  This morning he was prescribed Augmentin antibiotic, albuterol inhaler, Zofran for nausea and prednisone.  He had a chest x-ray this morning.  The albuterol is helping.  The Zofran is helping.  He did not get the COVID-vaccine.  He is a non-smoker.  He caught this from his wife who had COVID a few days before him.  He does have a underlying history of high blood pressure, impaired glucose and obesity.  He has lost 30 pounds in the last 2 months intentionally.  He does not check blood sugars.  Currently is on day 10 of symptoms.  No other aggravating or relieving factors.  No other c/o.  Past Medical History:  Diagnosis Date  . Arthritis    "qwhere" (02/03/2017)  . BPH (benign prostatic hyperplasia)   . Childhood asthma    "as a baby"  . Coronary artery disease Non-obstructive   a. nonobs cath in 2006;  b. 01/2013 Cath: LM nl, LAD 40m, LCX nl, RCA non-dom, nl. EF 60-65%.  . Esophageal ring, acquired 07/12/2013  . GERD (gastroesophageal reflux disease)   . H/O  hiatal hernia    "think so" (02/06/2014)  . Headache    "resolved w/BP control in 11/2015" (02/03/2017)  . High cholesterol   . History of kidney stones   . Hypertension   . Ileitis 2014   Suspected Crohn's, think not as time has gone on  . Obesity   . Pernicious anemia-B12 deficiency 04/18/2013  . PONV (postoperative nausea and vomiting) 02/03/2017; 08/2016  . Sleep apnea    cpap every night    ROS as in subjective   Objective: BP 110/71   Pulse (!) 118   Temp 99.5 F (37.5 C)   SpO2 95%   General appearance: Alert, WD/WN, no distress, moderate ill appearing, while male, not diaphoretic                             Skin: warm, no rash                           Head: no sinus tenderness                            Eyes: conjunctiva normal, corneas clear, PERRLA  Ears: pearly TMs, external ear canals normal                          Nose: septum midline, turbinates not swollen, with erythema and clear discharge             Mouth/throat: MMM, tongue normal, mild pharyngeal erythema                           Neck: supple, no adenopathy, no thyromegaly, non tender, no JVD                          Heart: Tachycardic around 118 otherwise, normal S1, S2, no murmurs                         Lungs: Scattered rhonchi, no wheezes or rales, but relatively good inspiration expiration No extremity edema, negative Homans signs, no asymmetry of calves, nontender calves    CXR from 10/08/20 today reviewed  IMPRESSION: Low lung volumes with reticulonodular opacities suggesting multifocal infection.       Assessment  Encounter Diagnoses  Name Primary?  . COVID-19 virus infection Yes  . Cough   . Dehydration   . Nausea without vomiting   . SOB (shortness of breath)   . Tachycardia       Plan: We discussed his symptoms and concerns..  If positive, on day 10 of symptoms, abnormal lung sounds, reviewed chest x-ray that was done this morning which is  abnormal.  Clinically he has moderate symptoms and I suspect the tachycardia is from dyspnea and dehydration.   Discussed possibility of CT chest if d-dimer +.     Patient Instructions  Recommendations: Your primary care office prescribed some medications today.  continue the Albuterol 2 puffs every 4-6 hours for shortness of breath, wheezing, cough spells  Stop the Augmentin and change to Z-Pak  Finish the prednisone  Use the Zofran nausea medicine as needed every 4-6 hours   Your primary job right now is to increase hydration. Drink water, Pedialyte, soup broth, ice chips, popsicles, G2 Gatorade for example.  Try to get at least 80 to 100 ounces minimum per day  Rest!  You can do vitamins including vitamin C, vitamin D and zinc such as emergency immune plus over-the-counter   I am checking some labs on you including a marker that is a screening for a blood clot.  If the D-dimer blood clot screening test comes back positive we would potentially pursue a chest CT   If you are much worse in the next 2 to 3 days such as vomiting uncontrollably, worse shortness of breath, or not able to hydrate then get reevaluated at urgent care or emergency department      Patient voiced understanding of diagnosis, recommendations, and treatment plan.  After visit summary given.   Kajuan was seen today for covid positive.  Diagnoses and all orders for this visit:  COVID-19 virus infection -     CBC with Differential/Platelet -     Basic metabolic panel -     D-dimer, quantitative (not at [facility]) -     High sensitivity CRP -     CBC -     Basic Metabolic Panel -     D-dimer, quantitative (not at [facility]) -     C-reactive protein  Cough -  CBC with Differential/Platelet -     Basic metabolic panel -     D-dimer, quantitative (not at [facility]) -     High sensitivity CRP  Dehydration -     CBC with Differential/Platelet -     Basic metabolic panel -     D-dimer, quantitative (not at  [facility]) -     High sensitivity CRP  Nausea without vomiting -     CBC with Differential/Platelet -     Basic metabolic panel -     D-dimer, quantitative (not at [facility]) -     High sensitivity CRP  SOB (shortness of breath) -     CBC with Differential/Platelet -     Basic metabolic panel -     D-dimer, quantitative (not at [facility]) -     High sensitivity CRP  Tachycardia -     CBC with Differential/Platelet -     Basic metabolic panel -     D-dimer, quantitative (not at [facility]) -     High sensitivity CRP  Other orders -     azithromycin (ZITHROMAX) 250 MG tablet; 2 tablets day 1, then 1 tablet days 2-4    f/u pending labs

## 2020-10-08 NOTE — Patient Instructions (Addendum)
Recommendations: Your primary care office prescribed some medications today.  continue the Albuterol 2 puffs every 4-6 hours for shortness of breath, wheezing, cough spells  Stop the Augmentin and change to Z-Pak  Finish the prednisone  Use the Zofran nausea medicine as needed every 4-6 hours   Your primary job right now is to increase hydration. Drink water, Pedialyte, soup broth, ice chips, popsicles, G2 Gatorade for example.  Try to get at least 80 to 100 ounces minimum per day  Rest!  You can do vitamins including vitamin C, vitamin D and zinc such as emergency immune plus over-the-counter   I am checking some labs on you including a marker that is a screening for a blood clot.  If the D-dimer blood clot screening test comes back positive we would potentially pursue a chest CT   If you are much worse in the next 2 to 3 days such as vomiting uncontrollably, worse shortness of breath, or not able to hydrate then get reevaluated at urgent care or emergency department

## 2020-10-08 NOTE — ED Notes (Signed)
Pt handed in labels and std he was leaving

## 2020-10-08 NOTE — Telephone Encounter (Signed)
Pt states he went back to get the CXR - he was not supposed to be checked in at the ED & the radiology dept called him to tell him exactly where to go. Will call [facility] to do referral.

## 2020-10-08 NOTE — Telephone Encounter (Signed)
Patient and his wife called and stated he was not going to wait at the hospital for a chest x-ray. They told them it would be an all day thing. He had 51 people in front of him.  They have left and are returning home.

## 2020-10-08 NOTE — ED Triage Notes (Signed)
Pt arrived via walk in, c/o SOB, states COVID + since last Tuesday, requesting xray.

## 2020-10-08 NOTE — Progress Notes (Signed)
Taylor Schroeder is a 55 y.o. male with the following history as recorded in EpicCare:  Patient Active Problem List   Diagnosis Date Noted  . Encounter for general adult medical examination with abnormal findings 08/20/2020  . Intrinsic eczema 08/20/2020  . Slurred speech 05/19/2020  . Coital headache 05/19/2020  . Nonalcoholic steatohepatitis (NASH) 03/12/2020  . OA (osteoarthritis) of shoulder 07/31/2019  . Allergic rhinoconjunctivitis 01/03/2019  . Seasonal allergic rhinitis due to pollen 04/11/2018  . Vitamin D deficiency 12/21/2017  . 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 Sleep apnea 03/08/2017  . Hyperlipidemia with target LDL less than 100 12/22/2016  . Primary erectile dysfunction 11/18/2014  . Essential hypertension 07/15/2014  . DDD (degenerative disc disease), cervical 10/03/2013  . Esophageal ring, acquired 07/12/2013  . Crohn's ileitis - working dx 06/01/2013  . BPH (benign prostatic hyperplasia) 04/18/2013  . Pernicious anemia-B12 deficiency 04/18/2013  . Severe obesity (BMI >= 40) ([place]) 12/21/2012  . GERD (gastroesophageal reflux disease) 12/21/2012  . Hyperglycemia 08/29/2012  . Routine general medical examination at a health care facility 08/29/2012    Current Outpatient Medications  Medication Sig Dispense Refill  . albuterol (VENTOLIN HFA) 108 (90 Base) MCG/ACT inhaler Inhale 2 puffs into the lungs every 6 (six) hours as needed for wheezing or shortness of breath. 8 g 0  . amoxicillin-clavulanate (AUGMENTIN) 875-125 MG tablet Take 1 tablet by mouth 2 (two) times daily for 10 days. 20 tablet 0  . ondansetron (ZOFRAN ODT) 8 MG disintegrating tablet Take 1 tablet (8 mg total) by mouth every 8 (eight) hours as needed for nausea or vomiting. 20 tablet 0  . predniSONE (DELTASONE) 20 MG tablet Take 1 tablet (20 mg total) by mouth daily with breakfast. 5 tablet 0  . CVS D3 50 MCG (2000 UT) CAPS TAKE 2 CAPSULES BY MOUTH EVERY DAY 180 capsule 1  . CVS  VITAMIN B-12 2000 MCG TBCR TAKE 1 TABLET BY MOUTH EVERY DAY 90 tablet 1  . esomeprazole (NEXIUM) 40 MG capsule TAKE ONE CAPSULE BY MOUTH EVERY MORNING BEFORE BREAKFAST 90 capsule 0  . fluocinonide cream (LIDEX) 9.56 % Apply 1 application topically 2 (two) times daily. 120 g 1  . ibuprofen (ADVIL) 200 MG tablet Take 200-400 mg by mouth every 8 (eight) hours as needed for moderate pain.    . indapamide (LOZOL) 1.25 MG tablet TAKE 1 TABLET BY MOUTH EVERY DAY 90 tablet 0  . levocetirizine (XYZAL) 5 MG tablet Take 2 tablets (10 mg total) by mouth every evening. 180 tablet 0  . pioglitazone (ACTOS) 15 MG tablet Take 1 tablet (15 mg total) by mouth daily. 90 tablet 1  . Rimegepant Sulfate (NURTEC) 75 MG TBDP Take 1 tablet by mouth daily as needed. 16 tablet 0  . telmisartan (MICARDIS) 80 MG tablet TAKE 1 TABLET BY MOUTH EVERY DAY 90 tablet 1   No current facility-administered medications for this visit.    Allergies: Allegra [fexofenadine hcl], Hctz [hydrochlorothiazide], and Demerol [meperidine]  Past Medical History:  Diagnosis Date  . Arthritis    "qwhere" (02/03/2017)  . BPH (benign prostatic hyperplasia)   . Childhood asthma    "as a baby"  . Coronary artery disease Non-obstructive   a. nonobs cath in 2006;  b. 01/2013 Cath: LM nl, LAD 63m, LCX nl, RCA non-dom, nl. EF 60-65%.  . Esophageal ring, acquired 07/12/2013  . GERD (gastroesophageal reflux disease)   . H/O hiatal hernia    "think so" (02/06/2014)  . Headache    "  resolved w/BP control in 11/2015" (02/03/2017)  . High cholesterol   . History of kidney stones   . Hypertension   . Ileitis 2014   Suspected Crohn's, think not as time has gone on  . Obesity   . Pernicious anemia-B12 deficiency 04/18/2013  . PONV (postoperative nausea and vomiting) 02/03/2017; 08/2016  . Sleep apnea    cpap every night    Past Surgical History:  Procedure Laterality Date  . APPENDECTOMY    . CARDIAC CATHETERIZATION  01/2013   "results were clear"   . CARPAL TUNNEL RELEASE Right   . CHOLECYSTECTOMY N/A 02/03/2017   Procedure: LAPAROSCOPIC CHOLECYSTECTOMY;  Surgeon: Greer Pickerel, MD;  Location: [place];  Service: General;  Laterality: N/A;  . COLONOSCOPY  ~ 2014  . COLONOSCOPY    . ESOPHAGOGASTRODUODENOSCOPY  ~ 2014  . INGUINAL HERNIA REPAIR  1966   "? side"  . KNEE ARTHROPLASTY Left 02/06/2014   Procedure: COMPUTER ASSISTED TOTAL KNEE ARTHROPLASTY;  Surgeon: [name], MD;  Location: [place];  Service: Orthopedics;  Laterality: Left;  Cemented Left Total Knee Arthroplasty  . KNEE ARTHROSCOPY Left 08/2016   scar tissue cleaned out  . LAPAROSCOPIC CHOLECYSTECTOMY  02/03/2017  . LEFT HEART CATHETERIZATION WITH CORONARY ANGIOGRAM N/A 01/29/2013   Procedure: LEFT HEART CATHETERIZATION WITH CORONARY ANGIOGRAM;  Surgeon: Thayer Headings, MD;  Location: [facility] CATH LAB;  Service: Cardiovascular;  Laterality: N/A;  . MULTIPLE TOOTH EXTRACTIONS  09/2016  . REVERSE SHOULDER ARTHROPLASTY Left 07/31/2019   Procedure: LEFT REVERSE SHOULDER ARTHROPLASTY;  Surgeon: Meredith Pel, MD;  Location: [place];  Service: Orthopedics;  Laterality: Left;  . SHOULDER ARTHROSCOPY Left 03/12/2013  . TOTAL KNEE ARTHROPLASTY Left 02/06/2014  . TOTAL KNEE ARTHROPLASTY Right 09/14/2017  . TOTAL KNEE ARTHROPLASTY Right 09/14/2017   Procedure: RIGHT TOTAL KNEE ARTHROPLASTY;  Surgeon: [name], MD;  Location: [place];  Service: Orthopedics;  Laterality: Right;    Family History  Problem Relation Age of Onset  . Hypertension Mother   . Cancer Father        type unknown  . COPD Other   . Emphysema Maternal Grandmother   . Emphysema Maternal Grandfather   . Other Paternal Grandmother        spinal cancer  . Alcohol abuse Neg Hx   . Diabetes Neg Hx   . Early death Neg Hx   . Heart disease Neg Hx   . Hyperlipidemia Neg Hx   . Kidney disease Neg Hx   . Stroke Neg Hx   . Colon cancer Neg Hx   . Stomach cancer Neg Hx   . Esophageal cancer Neg Hx   . Rectal cancer  Neg Hx   . Liver cancer Neg Hx     Social History   Tobacco Use  . Smoking status: Former Smoker    Packs/day: 0.00    Years: 30.00    Pack years: 0.00    Quit date: 08/29/2008    Years since quitting: 12.1  . Smokeless tobacco: Never Used  Substance Use Topics  . Alcohol use: No    Comment: 02/03/2017 "maybe 12 pack of beer all at one time but only once/year"; 02/06/2014 "couple beers a couple times/month"    Subjective:   I connected with ZAYD COGGESHALL on 10/08/20 at 11:40 AM EST by a telephone call and verified that I am speaking with the correct person using two identifiers.   I discussed the limitations of evaluation and management by  telemedicine and the availability of in person appointments. The patient expressed understanding and agreed to proceed. Provider in office/ patient is at home; provider and patient are only 2 people on telephone call.   Patient tested + for COVID on 09/30/2020 at [facility] and was started on Prednisone taper with initial improvement; by Saturday, 10/04/2020, he felt like his symptoms were worsening; having increased cough/ chest congestion; notes that last night was very close to going to ER due to difficulty breathing; having to sleep sitting up in recliner; doing better this morning; does not have pulse ox at home; In reviewing messages, his PCP was going to refer patient to post-COVID clinic for evaluation but patient has not been contacted yet for appointment.  Notes that he tried taking left over Doxycycline 2 days ago but could not tolerate due to the nausea- would prefer different treatment;     Objective:  There were no vitals filed for this visit.  Lungs: Respirations unlabored; voice is strong and speaks in full sentences;  Neurologic: Alert and oriented; speech intact;   Assessment:  1. COVID-19   2. Cough   3. Shortness of breath     Plan:  Will order CXR at [facility] outpatient; start Augmentin 875 mg bid x 10 days, ( patient defers  Doxycycline) Prednisone 20 mg qd x 5 days and albuterol inhaler; Rx for Zofran to help with nausea; increase fluids, rest and plan to follow-up in office with his PCP early next week for re-check ( would be 10 days out from diagnosis);  Time spent 15 minutes  No follow-ups on file.  Orders Placed This Encounter  Procedures  . DG Chest 2 View    Standing Status:   Future    Number of Occurrences:   1    Standing Expiration Date:   10/08/2021    Order Specific Question:   Reason for Exam (SYMPTOM  OR DIAGNOSIS REQUIRED)    Answer:   cough/ short of breat/ COVID + 09/30/20    Order Specific Question:   Preferred imaging location?    Answer:   [facility] [place] [facility]    Requested Prescriptions   Signed Prescriptions Disp Refills  . albuterol (VENTOLIN HFA) 108 (90 Base) MCG/ACT inhaler 8 g 0    Sig: Inhale 2 puffs into the lungs every 6 (six) hours as needed for wheezing or shortness of breath.  . predniSONE (DELTASONE) 20 MG tablet 5 tablet 0    Sig: Take 1 tablet (20 mg total) by mouth daily with breakfast.  . amoxicillin-clavulanate (AUGMENTIN) 875-125 MG tablet 20 tablet 0    Sig: Take 1 tablet by mouth 2 (two) times daily for 10 days.  . ondansetron (ZOFRAN ODT) 8 MG disintegrating tablet 20 tablet 0    Sig: Take 1 tablet (8 mg total) by mouth every 8 (eight) hours as needed for nausea or vomiting.

## 2020-10-09 ENCOUNTER — Other Ambulatory Visit: Payer: Self-pay | Admitting: Medical

## 2020-10-10 LAB — D-DIMER, QUANTITATIVE

## 2020-10-10 LAB — CBC WITH DIFFERENTIAL/PLATELET
Basophils Absolute: 0 10*3/uL (ref 0.0–0.2)
Basos: 0 %
EOS (ABSOLUTE): 0 10*3/uL (ref 0.0–0.4)
Eos: 0 %
Hematocrit: 44.1 % (ref 37.5–51.0)
Hemoglobin: 15.9 g/dL (ref 13.0–17.7)
Immature Grans (Abs): 0.1 10*3/uL (ref 0.0–0.1)
Immature Granulocytes: 1 %
Lymphocytes Absolute: 2 10*3/uL (ref 0.7–3.1)
Lymphs: 33 %
MCH: 30.8 pg (ref 26.6–33.0)
MCHC: 36.1 g/dL — ABNORMAL HIGH (ref 31.5–35.7)
MCV: 86 fL (ref 79–97)
Monocytes Absolute: 0.7 10*3/uL (ref 0.1–0.9)
Monocytes: 11 %
Neutrophils Absolute: 3.3 10*3/uL (ref 1.4–7.0)
Neutrophils: 55 %
Platelets: 241 10*3/uL (ref 150–450)
RBC: 5.16 x10E6/uL (ref 4.14–5.80)
RDW: 13.1 % (ref 11.6–15.4)
WBC: 6 10*3/uL (ref 3.4–10.8)

## 2020-10-10 LAB — BASIC METABOLIC PANEL
BUN/Creatinine Ratio: 14 (ref 9–20)
BUN: 16 mg/dL (ref 6–24)
CO2: 20 mmol/L (ref 20–29)
Calcium: 9.5 mg/dL (ref 8.7–10.2)
Chloride: 100 mmol/L (ref 96–106)
Creatinine, Ser: 1.16 mg/dL (ref 0.76–1.27)
GFR calc Af Amer: 81 mL/min/{1.73_m2} (ref >59)
GFR calc non Af Amer: 70 mL/min/{1.73_m2} (ref >59)
Glucose: 107 mg/dL — ABNORMAL HIGH (ref 65–99)
Potassium: 4 mmol/L (ref 3.5–5.2)
Sodium: 135 mmol/L (ref 134–144)

## 2020-10-10 LAB — HIGH SENSITIVITY CRP: CRP, High Sensitivity: 18.79 mg/L — ABNORMAL HIGH (ref 0.00–3.00)

## 2020-10-10 LAB — C-REACTIVE PROTEIN: CRP: 21 mg/L — ABNORMAL HIGH (ref 0–10)

## 2020-10-13 ENCOUNTER — Ambulatory Visit: Payer: BC Managed Care – PPO | Admitting: Internal Medicine

## 2020-10-13 ENCOUNTER — Telehealth: Payer: Self-pay | Admitting: Medical

## 2020-10-13 NOTE — Telephone Encounter (Signed)
Janette Please call this patient.  I just realized that I never got the lab results on him from last week.  Similar to the other patients from the respiratory clinic at night, there was a delay or some issue in getting the results.   I do not know if this has something to do with the way the test is ordered or with the fact that it is at the hospital and I am not their PCP.  I never actually got a lab result on this guy  I hope if he did not get better that he contacted his PCP Dr. Ronnald Ramp  Can you please reach out to him and also see if you can find out what happened to the blood work so we can avoid delays in the future.  I am not in the office today so if you get labs, you can either send me a MyChart message or hopefully they will do not come to my inbox if you find a solution  I want to know for future reference so we can not have a delay in labs ordered through the nighttime respiratory clinic  Thanks

## 2020-10-13 NOTE — Telephone Encounter (Signed)
I am not sure why results are delayed may have to contact IT. I dont think it has to do with you not being his PCP since you are ordering. I am able to see them in [place] but not sure how to send them to you.

## 2020-10-13 NOTE — Telephone Encounter (Signed)
Thanks for speaking with him.

## 2020-10-13 NOTE — Telephone Encounter (Signed)
Patient stated he is feeling a lot better. He has found it hard to get around at times but not as bad as it was while he was at the clinic.    He is aware of his appointment with his PCP on 1/20.

## 2020-10-13 NOTE — Telephone Encounter (Signed)
Thanks for checking.  Can you check in with him to see how he is doing and certainly recommend he reach out to his PCP or post-COVID clinic  Could you at least reply back with abnormal results from the labs.  I will send you my cell phone number through secure chat or staff message if you want to call me and just read off the lab results

## 2020-10-14 NOTE — Telephone Encounter (Signed)
Taylor Schroeder -  Thank you for seeing him. I hope you are well.  TJ

## 2020-10-16 ENCOUNTER — Encounter: Payer: Self-pay | Admitting: Internal Medicine

## 2020-10-16 ENCOUNTER — Other Ambulatory Visit: Payer: Self-pay

## 2020-10-16 ENCOUNTER — Ambulatory Visit (INDEPENDENT_AMBULATORY_CARE_PROVIDER_SITE_OTHER): Payer: BC Managed Care – PPO | Admitting: Internal Medicine

## 2020-10-16 ENCOUNTER — Ambulatory Visit (INDEPENDENT_AMBULATORY_CARE_PROVIDER_SITE_OTHER): Payer: BC Managed Care – PPO

## 2020-10-16 VITALS — BP 124/82 | HR 92 | Temp 98.1°F | Resp 16 | Ht 69.0 in | Wt 265.5 lb

## 2020-10-16 DIAGNOSIS — J324 Chronic pansinusitis: Secondary | ICD-10-CM | POA: Diagnosis not present

## 2020-10-16 DIAGNOSIS — U071 COVID-19: Secondary | ICD-10-CM

## 2020-10-16 DIAGNOSIS — J1282 Pneumonia due to coronavirus disease 2019: Secondary | ICD-10-CM | POA: Insufficient documentation

## 2020-10-16 NOTE — Patient Instructions (Signed)

## 2020-10-16 NOTE — Progress Notes (Unsigned)
Subjective:  Patient ID: Taylor Schroeder, male    DOB: [birth year]  Age: 55 y.o. MRN: [id number]  CC: Cough and Hypertension  This visit occurred during the SARS-CoV-2 public health emergency.  Safety protocols were in place, including screening questions prior to the visit, additional usage of staff PPE, and extensive cleaning of exam room while observing appropriate contact time as indicated for disinfecting solutions.    HPI Taylor Schroeder presents for f/up - He was diagnosed with COVID-19 infection about 2 weeks ago.  Over the last few months he has had intentional weight loss.  He tells me he is starting to feel better but continues to complain of nonproductive cough, weakness, fatigue, and night sweats.  He denies chest pain or hemoptysis.  He continues to complain of symptoms of chronic sinusitis and eustachian tube dysfunction.  He is just completed a course of Augmentin and Zithromax as well as prednisone.  He saw an ENT years ago but is wants to see a different one for second opinion.  Outpatient Medications Prior to Visit  Medication Sig Dispense Refill  . albuterol (VENTOLIN HFA) 108 (90 Base) MCG/ACT inhaler Inhale 2 puffs into the lungs every 6 (six) hours as needed for wheezing or shortness of breath. 8 g 0  . CVS D3 50 MCG (2000 UT) CAPS TAKE 2 CAPSULES BY MOUTH EVERY DAY 180 capsule 1  . CVS VITAMIN B-12 2000 MCG TBCR TAKE 1 TABLET BY MOUTH EVERY DAY 90 tablet 1  . esomeprazole (NEXIUM) 40 MG capsule TAKE ONE CAPSULE BY MOUTH EVERY MORNING BEFORE BREAKFAST 90 capsule 0  . fluocinonide cream (LIDEX) 2.72 % Apply 1 application topically 2 (two) times daily. 120 g 1  . indapamide (LOZOL) 1.25 MG tablet TAKE 1 TABLET BY MOUTH EVERY DAY 90 tablet 0  . levocetirizine (XYZAL) 5 MG tablet Take 2 tablets (10 mg total) by mouth every evening. 180 tablet 0  . pioglitazone (ACTOS) 15 MG tablet Take 1 tablet (15 mg total) by mouth daily. 90 tablet 1  . Rimegepant Sulfate (NURTEC) 75 MG  TBDP Take 1 tablet by mouth daily as needed. 16 tablet 0  . telmisartan (MICARDIS) 80 MG tablet TAKE 1 TABLET BY MOUTH EVERY DAY 90 tablet 1  . ibuprofen (ADVIL) 200 MG tablet Take 200-400 mg by mouth every 8 (eight) hours as needed for moderate pain.    Marland Kitchen ondansetron (ZOFRAN ODT) 8 MG disintegrating tablet Take 1 tablet (8 mg total) by mouth every 8 (eight) hours as needed for nausea or vomiting. 20 tablet 0  . amoxicillin-clavulanate (AUGMENTIN) 875-125 MG tablet Take 1 tablet by mouth 2 (two) times daily for 10 days. 20 tablet 0  . azithromycin (ZITHROMAX) 250 MG tablet 2 tablets day 1, then 1 tablet days 2-4 6 tablet 0  . predniSONE (DELTASONE) 20 MG tablet Take 1 tablet (20 mg total) by mouth daily with breakfast. 5 tablet 0   No facility-administered medications prior to visit.    ROS Review of Systems  Constitutional: Positive for fatigue. Negative for appetite change, chills, diaphoresis and unexpected weight change.  HENT: Positive for congestion, ear pain, postnasal drip and rhinorrhea. Negative for facial swelling, sinus pressure, sinus pain, sore throat and trouble swallowing.   Eyes: Negative.   Respiratory: Positive for cough. Negative for chest tightness, shortness of breath and wheezing.   Cardiovascular: Negative for chest pain, palpitations and leg swelling.  Gastrointestinal: Negative for abdominal pain, constipation, diarrhea, nausea and vomiting.  Endocrine: Negative.  Genitourinary: Negative.  Negative for difficulty urinating and dysuria.  Musculoskeletal: Negative for arthralgias, joint swelling, myalgias and neck pain.  Skin: Negative.  Negative for color change, pallor and rash.  Neurological: Negative.  Negative for dizziness, weakness and headaches.  Hematological: Negative for adenopathy. Does not bruise/bleed easily.  Psychiatric/Behavioral: Negative.     Objective:  BP 124/82 (BP Location: Left Arm, Patient Position: Sitting, Cuff Size: Large)   Pulse 92    Temp 98.1 F (36.7 C) (Oral)   Resp 16   Ht 5\' 9"  (1.753 m)   Wt 265 lb 8 oz (120.4 kg)   SpO2 97%   BMI 39.21 kg/m   BP Readings from Last 3 Encounters:  10/16/20 124/82  10/08/20 110/71  10/08/20 (!) 155/106    Wt Readings from Last 3 Encounters:  10/16/20 265 lb 8 oz (120.4 kg)  08/20/20 288 lb (130.6 kg)  07/02/20 280 lb (127 kg)    Physical Exam Vitals reviewed.  Constitutional:      Appearance: Normal appearance.  HENT:     Right Ear: Hearing, tympanic membrane, ear canal and external ear normal.     Left Ear: Hearing, tympanic membrane, ear canal and external ear normal.     Mouth/Throat:     Mouth: Mucous membranes are moist.     Pharynx: No oropharyngeal exudate or posterior oropharyngeal erythema.  Eyes:     General: No scleral icterus.    Conjunctiva/sclera: Conjunctivae normal.  Cardiovascular:     Rate and Rhythm: Normal rate and regular rhythm.     Heart sounds: No murmur heard.   Pulmonary:     Effort: Pulmonary effort is normal.     Breath sounds: No stridor. No wheezing, rhonchi or rales.  Abdominal:     General: Abdomen is protuberant. Bowel sounds are normal. There is no distension.     Palpations: Abdomen is soft. There is no hepatomegaly, splenomegaly or mass.     Tenderness: There is no abdominal tenderness.  Musculoskeletal:        General: Normal range of motion.     Cervical back: Neck supple.     Right lower leg: No edema.     Left lower leg: No edema.  Lymphadenopathy:     Cervical: No cervical adenopathy.  Skin:    General: Skin is warm and dry.     Coloration: Skin is not pale.  Neurological:     General: No focal deficit present.     Mental Status: He is alert.  Psychiatric:        Mood and Affect: Mood normal.        Behavior: Behavior normal.     Lab Results  Component Value Date   WBC 6.0 10/09/2020   HGB 15.9 10/09/2020   HCT 44.1 10/09/2020   PLT 241 10/09/2020   GLUCOSE 107 (H) 10/09/2020   CHOL 198  05/03/2019   TRIG 187.0 (H) 03/10/2020   HDL 45.60 05/03/2019   LDLDIRECT 134.0 05/03/2019   LDLCALC 110 (H) 12/21/2017   ALT 100 (H) 03/10/2020   AST 49 (H) 03/10/2020   NA 135 10/09/2020   K 4.0 10/09/2020   CL 100 10/09/2020   CREATININE 1.16 10/09/2020   BUN 16 10/09/2020   CO2 20 10/09/2020   TSH 1.77 05/03/2019   PSA 2.67 12/21/2017   INR 0.95 09/07/2017   HGBA1C 5.6 05/03/2019    DG Chest 2 View  Result Date: 10/08/2020 CLINICAL DATA:  55 year old male with a  history of shortness of breath COVID EXAM: CHEST - 2 VIEW COMPARISON:  11/20/2018 FINDINGS: Cardiomediastinal silhouette unchanged size contour. Low lung volumes. Mild reticulonodular opacities of the lungs. No pneumothorax. No pleural effusion. No displaced fracture. Surgical changes of the left glenohumeral joint. IMPRESSION: Low lung volumes with reticulonodular opacities suggesting multifocal infection. Electronically Signed   By: Corrie Mckusick D.O.   On: 10/08/2020 16:50   DG Chest 2 View  Result Date: 10/16/2020 CLINICAL DATA:  COVID-19 positive. EXAM: CHEST - 2 VIEW COMPARISON:  October 08, 2020. FINDINGS: The heart size and mediastinal contours are within normal limits. Both lungs are clear. The visualized skeletal structures are unremarkable. IMPRESSION: No active cardiopulmonary disease. Electronically Signed   By: Marijo Conception M.D.   On: 10/16/2020 16:31    Assessment & Plan:   Yassir was seen today for cough and hypertension.  Diagnoses and all orders for this visit:  Pneumonia due to COVID-19 virus- Based on his symptoms, exam, and normal chest x-ray this is resolving as expected.  No additional treatment options are indicated. -     DG Chest 2 View; Future  Chronic pansinusitis -     Ambulatory referral to ENT   I have discontinued Vicente Serene D. Tomson "Darren"'s ibuprofen, predniSONE, amoxicillin-clavulanate, ondansetron, and azithromycin. I am also having him maintain his indapamide, CVS D3, CVS  Vitamin B-12, pioglitazone, Nurtec, telmisartan, esomeprazole, fluocinonide cream, levocetirizine, and albuterol.  No orders of the defined types were placed in this encounter.    Follow-up: Return in about 3 weeks (around 11/06/2020).  Scarlette Calico, MD

## 2020-10-31 ENCOUNTER — Other Ambulatory Visit: Payer: Self-pay | Admitting: Family

## 2020-11-03 ENCOUNTER — Other Ambulatory Visit: Payer: Self-pay

## 2020-11-03 ENCOUNTER — Encounter (INDEPENDENT_AMBULATORY_CARE_PROVIDER_SITE_OTHER): Payer: Self-pay | Admitting: Otolaryngology

## 2020-11-03 ENCOUNTER — Ambulatory Visit (INDEPENDENT_AMBULATORY_CARE_PROVIDER_SITE_OTHER): Payer: BC Managed Care – PPO | Admitting: Otolaryngology

## 2020-11-03 VITALS — Temp 96.4°F

## 2020-11-03 DIAGNOSIS — Z8709 Personal history of other diseases of the respiratory system: Secondary | ICD-10-CM | POA: Diagnosis not present

## 2020-11-03 DIAGNOSIS — J342 Deviated nasal septum: Secondary | ICD-10-CM | POA: Diagnosis not present

## 2020-11-03 DIAGNOSIS — J310 Chronic rhinitis: Secondary | ICD-10-CM | POA: Diagnosis not present

## 2020-11-03 DIAGNOSIS — H6521 Chronic serous otitis media, right ear: Secondary | ICD-10-CM | POA: Diagnosis not present

## 2020-11-03 NOTE — Progress Notes (Signed)
HPI: Taylor Schroeder is a 55 y.o. male who presents is referred by his PCP Dr. Ronnald Ramp for evaluation of chronic sinus issues that he has had for years.  His right side is always worse than the left side.  He stated that he lost an upper molar on the right side a number of years ago.  He has had previous abscess of his sinus and treated years ago with antibiotics.  Patient is always clogged on the right side to the degree that he has to use Afrin every night in order to breathe adequately.  He does have obstructive sleep apnea and uses nasal CPAP.  However the pressure is always worse on the right side and his right cheek and he has intermittent odors in his nose and sinuses.  He apparently had Covid the first part of last month.. He also complains of frequent "sinus headaches". He also has a sensation of fluid behind his right ear that has been going on for several months.  Past Medical History:  Diagnosis Date  . Arthritis    "qwhere" (02/03/2017)  . BPH (benign prostatic hyperplasia)   . Childhood asthma    "as a baby"  . Coronary artery disease Non-obstructive   a. nonobs cath in 2006;  b. 01/2013 Cath: LM nl, LAD 85m, LCX nl, RCA non-dom, nl. EF 60-65%.  . Esophageal ring, acquired 07/12/2013  . GERD (gastroesophageal reflux disease)   . H/O hiatal hernia    "think so" (02/06/2014)  . Headache    "resolved w/BP control in 11/2015" (02/03/2017)  . High cholesterol   . History of kidney stones   . Hypertension   . Ileitis 2014   Suspected Crohn's, think not as time has gone on  . Obesity   . Pernicious anemia-B12 deficiency 04/18/2013  . PONV (postoperative nausea and vomiting) 02/03/2017; 08/2016  . Sleep apnea    cpap every night   Past Surgical History:  Procedure Laterality Date  . APPENDECTOMY    . CARDIAC CATHETERIZATION  01/2013   "results were clear"  . CARPAL TUNNEL RELEASE Right   . CHOLECYSTECTOMY N/A 02/03/2017   Procedure: LAPAROSCOPIC CHOLECYSTECTOMY;  Surgeon: Greer Pickerel, MD;  Location: [place];  Service: General;  Laterality: N/A;  . COLONOSCOPY  ~ 2014  . COLONOSCOPY    . ESOPHAGOGASTRODUODENOSCOPY  ~ 2014  . INGUINAL HERNIA REPAIR  1966   "? side"  . KNEE ARTHROPLASTY Left 02/06/2014   Procedure: COMPUTER ASSISTED TOTAL KNEE ARTHROPLASTY;  Surgeon: [name], MD;  Location: [place];  Service: Orthopedics;  Laterality: Left;  Cemented Left Total Knee Arthroplasty  . KNEE ARTHROSCOPY Left 08/2016   scar tissue cleaned out  . LAPAROSCOPIC CHOLECYSTECTOMY  02/03/2017  . LEFT HEART CATHETERIZATION WITH CORONARY ANGIOGRAM N/A 01/29/2013   Procedure: LEFT HEART CATHETERIZATION WITH CORONARY ANGIOGRAM;  Surgeon: Thayer Headings, MD;  Location: [facility] CATH LAB;  Service: Cardiovascular;  Laterality: N/A;  . MULTIPLE TOOTH EXTRACTIONS  09/2016  . REVERSE SHOULDER ARTHROPLASTY Left 07/31/2019   Procedure: LEFT REVERSE SHOULDER ARTHROPLASTY;  Surgeon: Meredith Pel, MD;  Location: [place];  Service: Orthopedics;  Laterality: Left;  . SHOULDER ARTHROSCOPY Left 03/12/2013  . TOTAL KNEE ARTHROPLASTY Left 02/06/2014  . TOTAL KNEE ARTHROPLASTY Right 09/14/2017  . TOTAL KNEE ARTHROPLASTY Right 09/14/2017   Procedure: RIGHT TOTAL KNEE ARTHROPLASTY;  Surgeon: [name], MD;  Location: [place];  Service: Orthopedics;  Laterality: Right;   Social History   Socioeconomic History  .  Marital status: Married    Spouse name: Not on file  . Number of children: 2  . Years of education: Not on file  . Highest education level: Not on file  Occupational History  . Occupation: MAINTENANCE    Employer: [employer]  Tobacco Use  . Smoking status: Former Smoker    Packs/day: 0.00    Years: 30.00    Pack years: 0.00    Quit date: 08/29/2008    Years since quitting: 12.1  . Smokeless tobacco: Never Used  Vaping Use  . Vaping Use: Never used  Substance and Sexual Activity  . Alcohol use: No    Comment: 02/03/2017 "maybe 12 pack of beer all at one time but only  once/year"; 02/06/2014 "couple beers a couple times/month"  . Drug use: No  . Sexual activity: Yes  Other Topics Concern  . Not on file  Social History Narrative   Married, 2 daughters   He does HVAC work for the [employer]   Rare alcohol, former smoker no drugs no other tobacco   Social Determinants of Radio broadcast assistant Strain: Not on file  Food Insecurity: Not on file  Transportation Needs: Not on file  Physical Activity: Not on file  Stress: Not on file  Social Connections: Not on file   Family History  Problem Relation Age of Onset  . Hypertension Mother   . Cancer Father        type unknown  . COPD Other   . Emphysema Maternal Grandmother   . Emphysema Maternal Grandfather   . Other Paternal Grandmother        spinal cancer  . Alcohol abuse Neg Hx   . Diabetes Neg Hx   . Early death Neg Hx   . Heart disease Neg Hx   . Hyperlipidemia Neg Hx   . Kidney disease Neg Hx   . Stroke Neg Hx   . Colon cancer Neg Hx   . Stomach cancer Neg Hx   . Esophageal cancer Neg Hx   . Rectal cancer Neg Hx   . Liver cancer Neg Hx    Allergies  Allergen Reactions  . Allegra [Fexofenadine Hcl] Hives  . Hctz [Hydrochlorothiazide] Other (See Comments)    Weak and muscle cramps   . Demerol [Meperidine] Nausea And Vomiting    "Bad things" happen when I take it"   Prior to Admission medications   Medication Sig Start Date End Date Taking? Authorizing Provider  albuterol (VENTOLIN HFA) 108 (90 Base) MCG/ACT inhaler INHALE 2 PUFFS INTO THE LUNGS EVERY 6 HOURS AS NEEDED FOR WHEEZING/SHORTNESS OF BREATH 10/31/20   Janith Lima, MD  [facility] D3 50 MCG (2000 UT) CAPS TAKE 2 CAPSULES BY MOUTH EVERY DAY 11/01/19   Janith Lima, MD  [facility] VITAMIN B-12 2000 MCG TBCR TAKE 1 TABLET BY MOUTH EVERY DAY 11/15/19   Janith Lima, MD  esomeprazole (NEXIUM) 40 MG capsule TAKE ONE CAPSULE BY MOUTH EVERY MORNING BEFORE BREAKFAST 08/19/20   Gatha Mayer, MD  fluocinonide cream  (LIDEX) 7.62 % Apply 1 application topically 2 (two) times daily. 08/20/20   Janith Lima, MD  indapamide (LOZOL) 1.25 MG tablet TAKE 1 TABLET BY MOUTH EVERY DAY 07/30/19   Janith Lima, MD  levocetirizine (XYZAL) 5 MG tablet Take 2 tablets (10 mg total) by mouth every evening. 08/20/20   Janith Lima, MD  pioglitazone (ACTOS) 15 MG tablet Take 1 tablet (15  mg total) by mouth daily. 03/12/20   Janith Lima, MD  Rimegepant Sulfate (NURTEC) 75 MG TBDP Take 1 tablet by mouth daily as needed. 05/19/20   Janith Lima, MD  telmisartan (MICARDIS) 80 MG tablet TAKE 1 TABLET BY MOUTH EVERY DAY 05/26/20   Janith Lima, MD     Positive ROS: Otherwise negative  All other systems have been reviewed and were otherwise negative with the exception of those mentioned in the HPI and as above.  Physical Exam: Constitutional: Alert, well-appearing, no acute distress Ears: External ears without lesions or tenderness. Ear canals are clear bilaterally.  He has a right serous otitis.  Left TM is clear. Nasal: External nose without lesions. Septum is severely deviated to the right with moderate rhinitis.  No polyps noted..  Moderate mucosal swelling but no obvious mucopurulent discharge noted. Oral: Lips and gums without lesions. Tongue and palate mucosa without lesions. Posterior oropharynx clear. Neck: No palpable adenopathy or masses Respiratory: Breathing comfortably  Skin: No facial/neck lesions or rash noted.  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 Questionable sinus disease.  Plan: Recommended regular use of nasal steroid spray he has used Flonase in the past and recommended use of this daily as this will help with eustachian tube function and hopefully help with the serous otitis. We will plan on scheduling a CT scan of the sinuses to better evaluate the sinuses.  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 He will follow-up following the CT scan of the sinuses to review this and discuss possible surgical intervention.   Radene Journey, MD   CC:

## 2020-11-04 ENCOUNTER — Telehealth: Payer: Self-pay | Admitting: Orthopaedic Surgery

## 2020-11-04 NOTE — Telephone Encounter (Signed)
Patient submitted medical release form, short term disability papers, $ 25.00 check payment to [employer]. Accepted 11/04/20.

## 2020-11-05 ENCOUNTER — Other Ambulatory Visit (INDEPENDENT_AMBULATORY_CARE_PROVIDER_SITE_OTHER): Payer: Self-pay

## 2020-11-05 DIAGNOSIS — J329 Chronic sinusitis, unspecified: Secondary | ICD-10-CM

## 2020-11-10 ENCOUNTER — Other Ambulatory Visit: Payer: Self-pay | Admitting: Internal Medicine

## 2020-11-10 DIAGNOSIS — L2084 Intrinsic (allergic) eczema: Secondary | ICD-10-CM

## 2020-11-24 ENCOUNTER — Other Ambulatory Visit: Payer: BC Managed Care – PPO

## 2020-11-30 ENCOUNTER — Other Ambulatory Visit: Payer: Self-pay | Admitting: Internal Medicine

## 2020-11-30 DIAGNOSIS — I10 Essential (primary) hypertension: Secondary | ICD-10-CM

## 2020-12-17 ENCOUNTER — Ambulatory Visit (INDEPENDENT_AMBULATORY_CARE_PROVIDER_SITE_OTHER): Payer: BC Managed Care – PPO | Admitting: Internal Medicine

## 2020-12-17 ENCOUNTER — Other Ambulatory Visit: Payer: Self-pay

## 2020-12-17 ENCOUNTER — Encounter: Payer: Self-pay | Admitting: Internal Medicine

## 2020-12-17 VITALS — BP 140/80 | HR 96 | Ht 69.0 in | Wt 272.0 lb

## 2020-12-17 DIAGNOSIS — E669 Obesity, unspecified: Secondary | ICD-10-CM

## 2020-12-17 DIAGNOSIS — K219 Gastro-esophageal reflux disease without esophagitis: Secondary | ICD-10-CM

## 2020-12-17 DIAGNOSIS — E8881 Metabolic syndrome: Secondary | ICD-10-CM | POA: Diagnosis not present

## 2020-12-17 DIAGNOSIS — K760 Fatty (change of) liver, not elsewhere classified: Secondary | ICD-10-CM

## 2020-12-17 DIAGNOSIS — Z8616 Personal history of COVID-19: Secondary | ICD-10-CM

## 2020-12-17 DIAGNOSIS — K222 Esophageal obstruction: Secondary | ICD-10-CM

## 2020-12-17 MED ORDER — ESOMEPRAZOLE MAGNESIUM 40 MG PO CPDR: DELAYED_RELEASE_CAPSULE | ORAL | 3 refills | Status: DC

## 2020-12-17 NOTE — Patient Instructions (Addendum)
Dr Carlean Purl has given you diet information to read over.  Good work on your weight loss so far.   We have sent the following medications to your pharmacy for you to pick up at your convenience: Nexium  I appreciate the opportunity to care for you. Silvano Rusk, MD, [facility]

## 2020-12-17 NOTE — Progress Notes (Signed)
Taylor Schroeder 55 y.o. 09-21-1966 [id number]  Assessment & Plan:   Encounter Diagnoses  Name Primary?  . GERD with stricture Yes  . NAFLD (nonalcoholic fatty liver disease)   . Abdominal obesity and metabolic syndrome   . Severe obesity (BMI >= 40) (HCC)   . History of COVID-19    I have refilled his generic Nexium.  I have asked him to continue to try to lose weight and have congratulated him on his efforts to date.  Obviously he is recovering from Covid and it is a difficult time to make further maneuvers with his diet but I think it is really important to his long-term health particularly since he has metabolic syndrome and we know he has Karlene Lineman or suspect that.  If he loses weight he may be able to come off certain medications and he certainly would feel better.  He has the insight and he has done so.  He is struggling with possibly going on disability and the cost of things.  He is having some recurrent dysphagia so an upper GI endoscopy with esophageal dilation is reasonable though we know the etiology of his problems so he will manage this medically and with diet for now.  Returns to clinic on 03/09/2021  Recheck LFTs then discuss NAFLD further.   Meds ordered this encounter  Medications                     . esomeprazole (NEXIUM) 40 MG capsule    Sig: TAKE ONE CAPSULE BY MOUTH EVERY MORNING BEFORE BREAKFAST    Dispense:  90 capsule    Refill:  3      Read The Obesity Code by Dr. Sharman Cheek = implement  suggestions. Investigate and sign up for the www.dietdoctor.com website if desired and utilize those resources. Checkout Dr. Luis Abed on YouTube You can also look up Dr. Enrigue Catena on the Internet and YouTube I have provided handouts on insulin resistance, restricted feeding/intermittent fasting, and proper food choices to lower and eliminate insulin resistance and lose weight. Have a long-term approach to this and do not expect rapid results but have a 1 to 2-year  timeframe to change her eating and to become fat adapted.  I appreciate the opportunity to care for this patient. CC: Janith Lima, MD     Subjective:   Chief Complaint: Reflux history of stricture/ring dilation  HPI Taylor Schroeder is a 55 year old man with pernicious anemia and B12 deficiency, history of ileitis that transiently thought to be Crohn's but not proven, GERD and esophageal ring dilated twice.  He returns for follow-up reporting that his generic Nexium is controlling his heartburn though he has started to have some intermittent dysphagia again.  He had a previous EGD with dilation last in December 2019, dilated with a [place].  He had marked improvement but over the last few months he started to have some dysphagia again.  In November of last year he decided to get more serious about weight loss again he has taken medications in the past, but he greatly reduce carbohydrates and he lost about 20 pounds.  He then contracted Covid in January and was very ill and said that he was so sick on Monday "I made my peace with God".  Fortunately he recovered from that but he is having some brain fog and fatigue.  He lost 10 pounds with that as well.  So 30 pound total weight loss he reports.  He is  not restricting food so much anymore and he has not regained weight.  He is eating a lot of candy he does not drink sugary sodas he has a little bit of orange juice in the mornings with his medications and sometimes he might drink and apple juice.  Coffee is black.  Review of systems also possible scratch that review of systems also positive for left shoulder pain.  He is out of work since 2020 he may be forced to resign.  He is trying to get a job where he might have less physical activity.  Finances are an issue.   Lab Results  Component Value Date   ALT 100 (H) 03/10/2020   AST 49 (H) 03/10/2020   ALKPHOS 59 03/10/2020   BILITOT 0.8 03/10/2020    Allergies  Allergen Reactions  . Allegra  [Fexofenadine Hcl] Hives  . Hctz [Hydrochlorothiazide] Other (See Comments)    Weak and muscle cramps   . Demerol [Meperidine] Nausea And Vomiting    "Bad things" happen when I take it"   Current Meds  Medication Sig  . albuterol (VENTOLIN HFA) 108 (90 Base) MCG/ACT inhaler INHALE 2 PUFFS INTO THE LUNGS EVERY 6 HOURS AS NEEDED FOR WHEEZING/SHORTNESS OF BREATH  . [facility] D3 50 MCG (2000 UT) CAPS TAKE 2 CAPSULES BY MOUTH EVERY DAY  . [facility]S VITAMIN B-12 2000 MCG TBCR TAKE 1 TABLET BY MOUTH EVERY DAY  . fluocinonide cream (LIDEX) 1.28 % Apply 1 application topically 2 (two) times daily.  . indapamide (LOZOL) 1.25 MG tablet TAKE 1 TABLET BY MOUTH EVERY DAY  . levocetirizine (XYZAL) 5 MG tablet TAKE 2 TABLETS (10 MG TOTAL) BY MOUTH EVERY EVENING.  . pioglitazone (ACTOS) 15 MG tablet Take 1 tablet (15 mg total) by mouth daily.  . Rimegepant Sulfate (NURTEC) 75 MG TBDP Take 1 tablet by mouth daily as needed.  Marland Kitchen telmisartan (MICARDIS) 80 MG tablet TAKE 1 TABLET BY MOUTH EVERY DAY  . [DISCONTINUED] esomeprazole (NEXIUM) 40 MG capsule TAKE ONE CAPSULE BY MOUTH EVERY MORNING BEFORE BREAKFAST   Past Medical History:  Diagnosis Date  . Arthritis    "qwhere" (02/03/2017)  . BPH (benign prostatic hyperplasia)   . Childhood asthma    "as a baby"  . Coronary artery disease Non-obstructive   a. nonobs cath in 2006;  b. 01/2013 Cath: LM nl, LAD 10m, LCX nl, RCA non-dom, nl. EF 60-65%.  . Esophageal ring, acquired 07/12/2013  . GERD (gastroesophageal reflux disease)   . H/O hiatal hernia    "think so" (02/06/2014)  . Headache    "resolved w/BP control in 11/2015" (02/03/2017)  . High cholesterol   . History of kidney stones   . Hypertension   . Ileitis 2014   Suspected Crohn's, think not as time has gone on  . NAFLD (nonalcoholic fatty liver disease) 03/12/2020  . Obesity   . Pernicious anemia-B12 deficiency 04/18/2013  . PONV (postoperative nausea and vomiting) 02/03/2017; 08/2016  . Sleep apnea    cpap  every night   Past Surgical History:  Procedure Laterality Date  . APPENDECTOMY    . CARDIAC CATHETERIZATION  01/2013   "results were clear"  . CARPAL TUNNEL RELEASE Right   . CHOLECYSTECTOMY N/A 02/03/2017   Procedure: LAPAROSCOPIC CHOLECYSTECTOMY;  Surgeon: Greer Pickerel, MD;  Location: [place];  Service: General;  Laterality: N/A;  . COLONOSCOPY  ~ 2014  . COLONOSCOPY    . ESOPHAGOGASTRODUODENOSCOPY  ~ 2014  . [place]   "?  side"  . KNEE ARTHROPLASTY Left 02/06/2014   Procedure: COMPUTER ASSISTED TOTAL KNEE ARTHROPLASTY;  Surgeon: [name], MD;  Location: [place];  Service: Orthopedics;  Laterality: Left;  Cemented Left Total Knee Arthroplasty  . KNEE ARTHROSCOPY Left 08/2016   scar tissue cleaned out  . LAPAROSCOPIC CHOLECYSTECTOMY  02/03/2017  . LEFT HEART CATHETERIZATION WITH CORONARY ANGIOGRAM N/A 01/29/2013   Procedure: LEFT HEART CATHETERIZATION WITH CORONARY ANGIOGRAM;  Surgeon: Thayer Headings, MD;  Location: [facility] CATH LAB;  Service: Cardiovascular;  Laterality: N/A;  . MULTIPLE TOOTH EXTRACTIONS  09/2016  . REVERSE SHOULDER ARTHROPLASTY Left 07/31/2019   Procedure: LEFT REVERSE SHOULDER ARTHROPLASTY;  Surgeon: Meredith Pel, MD;  Location: [place];  Service: Orthopedics;  Laterality: Left;  . SHOULDER ARTHROSCOPY Left 03/12/2013  . TOTAL KNEE ARTHROPLASTY Left 02/06/2014  . TOTAL KNEE ARTHROPLASTY Right 09/14/2017  . 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   Review of Systems As above Objective:   Physical Exam BP 140/80   Pulse 96   Ht 5\' 9"  (1.753 m)   Wt 272 lb (123.4 kg)   SpO2 (!) 74%   BMI 40.17 kg/m  Obese pleasant white man in no acute distress.  He has abdominal obesity increased girth.

## 2020-12-30 IMAGING — MR MR ABDOMEN WO/W CM
17 series · 48 of 48 positions shown · IV contrast (multihance)
Comparison: CT AP 07/05/2018

CLINICAL DATA: Right upper quadrant pain. Fatty
liver/steatohepatitis

EXAM:
MRI ABDOMEN WITHOUT AND WITH CONTRAST
TECHNIQUE: Multiplanar multisequence MR imaging of the abdomen was performed
both before and after the administration of intravenous contrast.
CONTRAST:  20mL MULTIHANCE GADOBENATE DIMEGLUMINE 529 MG/ML IV SOLN

[Series 4: T2 · coronal · 6.0mm · 1.56mm/px · 1 of 36 slices shown (1 of 3)]
[im 1/36]
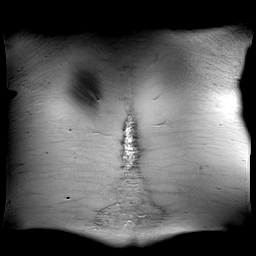

[Series 5: T1 · axial · 4.0mm · 1.19mm/px · z∈[-153,+131]mm · 5 of 144 slices shown]
[im 1/144]
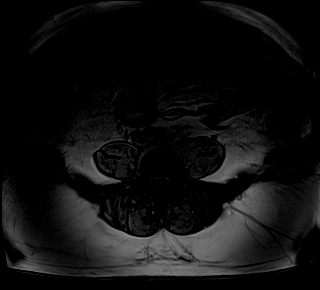
[im 36/144]
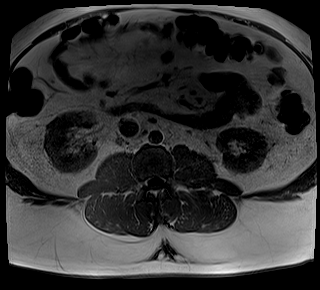
[im 72/144]
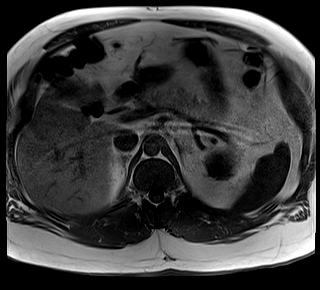
[im 108/144]
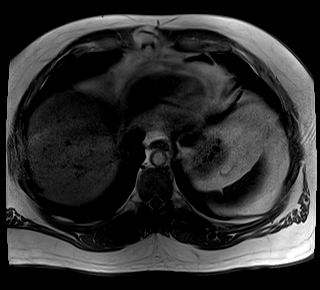
[im 144/144]
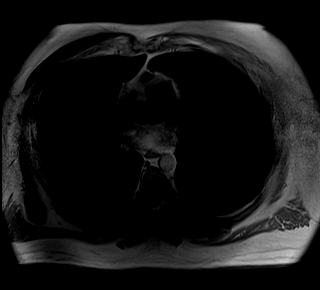

[Series 6: T2 · axial · 6.0mm · 1.48mm/px · z∈[-104,+162]mm · 2 of 38 slices shown (2 of 3)]
[im 1/38]
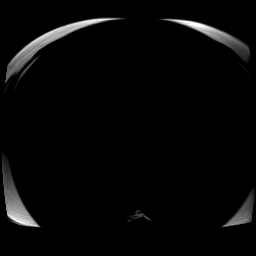
[im 38/38]
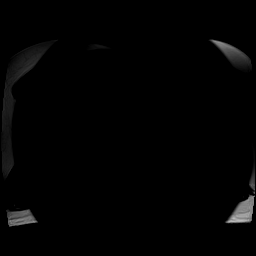

[Series 7: DWI · axial · 6.0mm · 1.42mm/px · z∈[-93,+159]mm · 5 of 108 slices shown (1 of 2)]
[im 1/108]
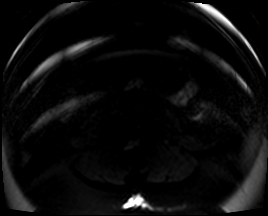
[im 27/108]
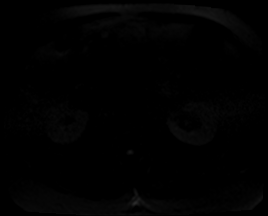
[im 54/108]
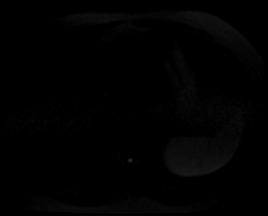
[im 81/108]
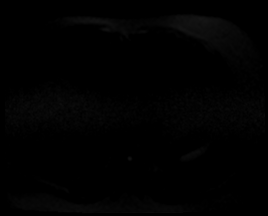
[im 108/108]
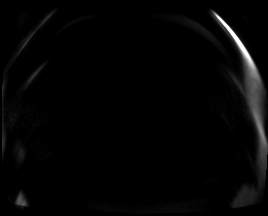

[Series 8: DWI · axial · 6.0mm · 1.42mm/px · z∈[-93,+159]mm · 2 of 36 slices shown (2 of 2)]
[im 1/36]
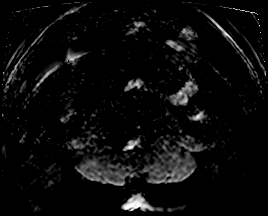
[im 36/36]
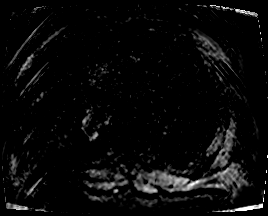

[Series 9: T2 · axial · 7.0mm · 1.19mm/px · 1 of 30 slices shown (3 of 3)]
[im 1/30]
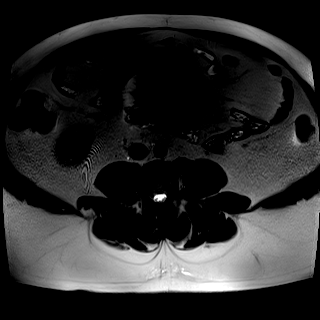

[Series 10: bSSFP · axial · 6.0mm · 1.41mm/px · z∈[-144,+123]mm · 2 of 38 slices shown]
[im 1/38]
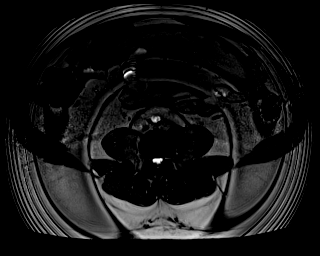
[im 38/38]
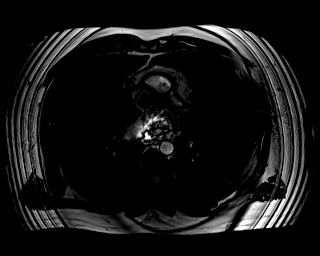

[Series 11: T1 dynamic · axial · non-contrast · 4.0mm · 1.41mm/px · z∈[-151,+133]mm · 3 of 72 slices shown]
[im 1/72]
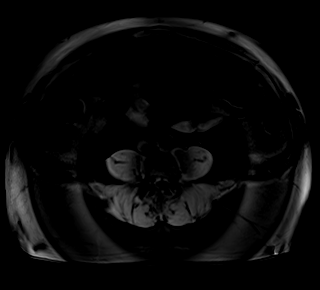
[im 36/72]
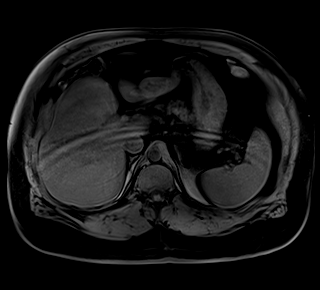
[im 72/72]
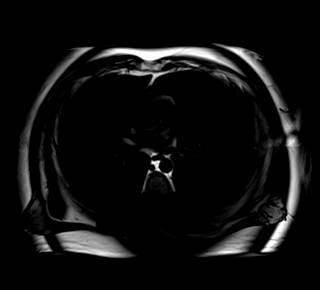

[Series 12: T1 dynamic post-contrast · axial · 4.0mm · 1.41mm/px · z∈[-151,+133]mm · 3 of 72 slices shown (1 of 9)]
[im 1/72]
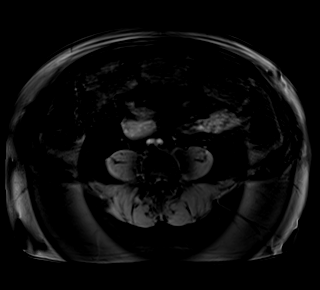
[im 36/72]
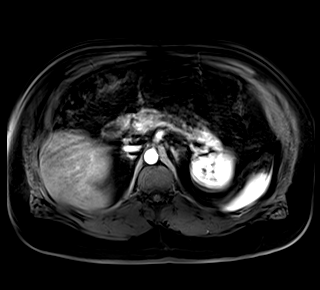
[im 72/72]
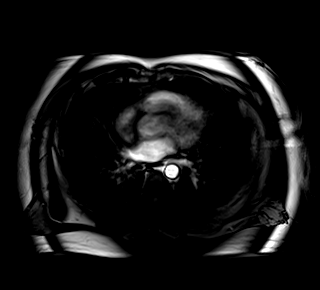

[Series 13: T1 dynamic post-contrast · axial · 4.0mm · 1.41mm/px · z∈[-151,+133]mm · 3 of 72 slices shown (2 of 9)]
[im 1/72]
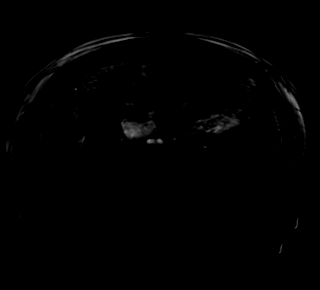
[im 36/72]
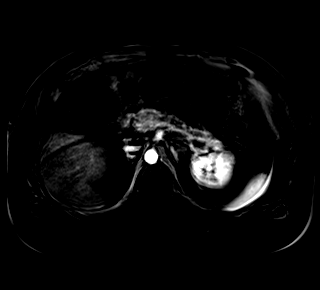
[im 72/72]
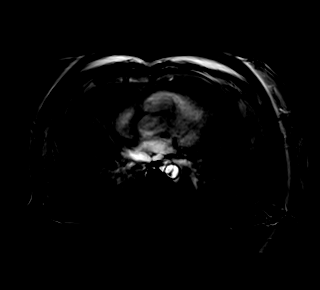

[Series 14: T1 dynamic post-contrast · axial · 4.0mm · 1.41mm/px · z∈[-151,+133]mm · 3 of 72 slices shown (3 of 9)]
[im 1/72]
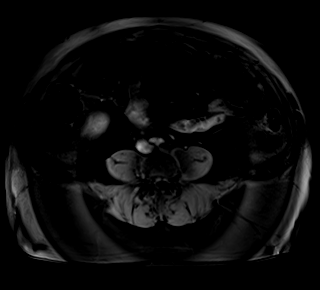
[im 36/72]
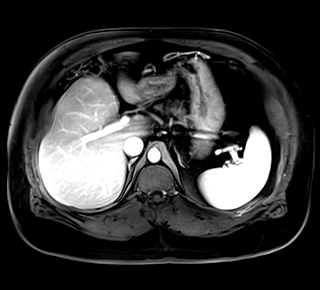
[im 72/72]
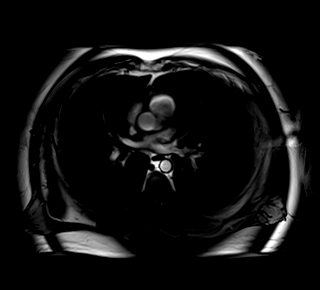

[Series 15: T1 dynamic post-contrast · axial · 4.0mm · 1.41mm/px · z∈[-151,+133]mm · 3 of 72 slices shown (4 of 9)]
[im 1/72]
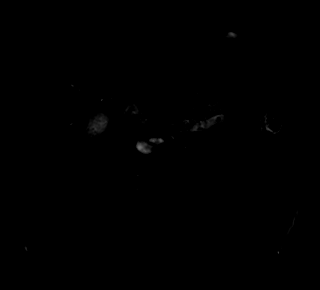
[im 36/72]
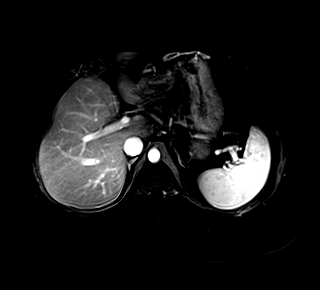
[im 72/72]
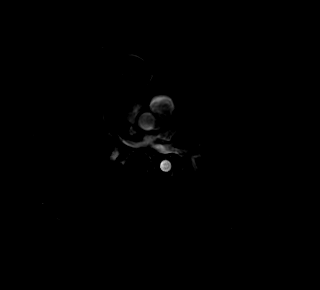

[Series 16: T1 dynamic post-contrast · axial · 4.0mm · 1.41mm/px · z∈[-151,+133]mm · 3 of 72 slices shown (5 of 9)]
[im 1/72]
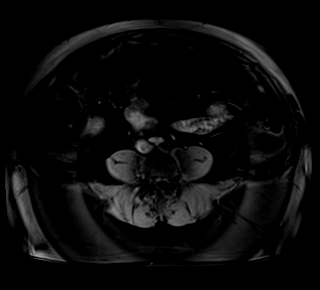
[im 36/72]
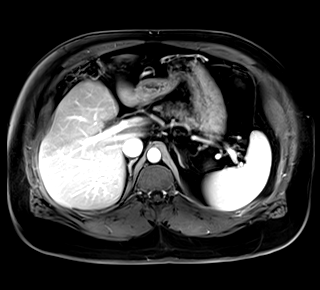
[im 72/72]
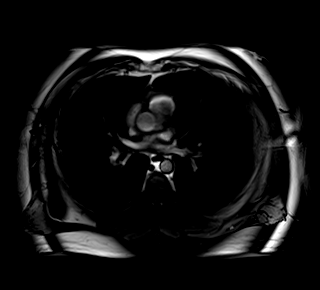

[Series 17: T1 dynamic post-contrast · axial · 4.0mm · 1.41mm/px · z∈[-151,+133]mm · 3 of 72 slices shown (6 of 9)]
[im 1/72]
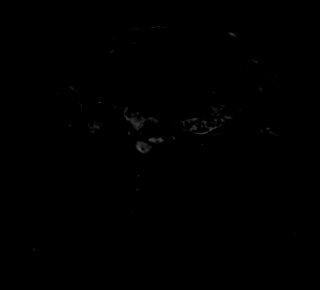
[im 36/72]
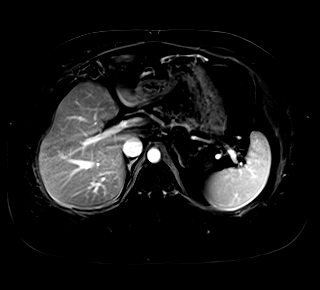
[im 72/72]
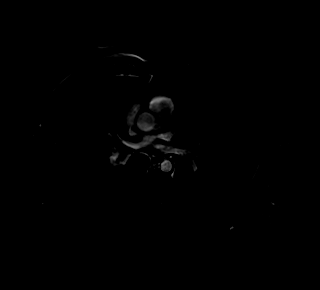

[Series 18: T1 dynamic post-contrast · coronal · 4.0mm · 1.25mm/px · 3 of 72 slices shown (7 of 9)]
[im 1/72]
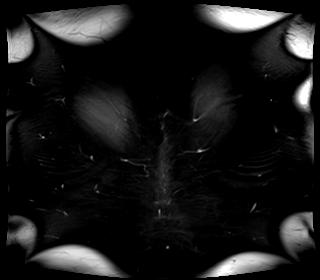
[im 36/72]
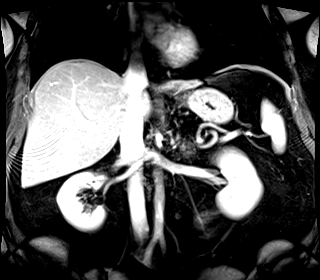
[im 72/72]
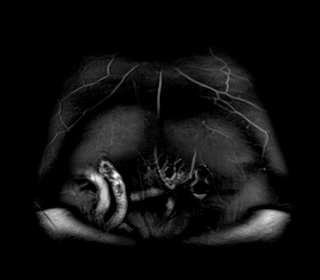

[Series 19: T1 dynamic post-contrast · axial · 4.0mm · 1.41mm/px · z∈[-151,+133]mm · 3 of 72 slices shown (8 of 9)]
[im 1/72]
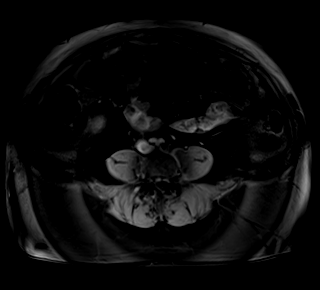
[im 36/72]
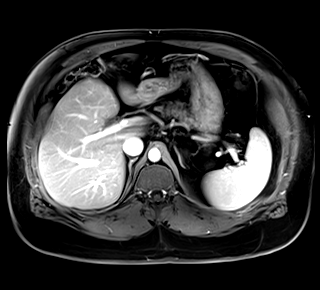
[im 72/72]
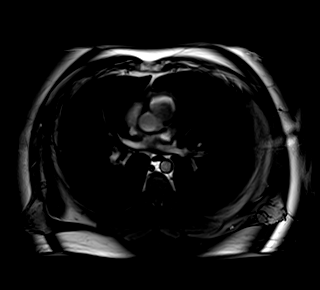

[Series 20: T1 dynamic post-contrast · axial · 4.0mm · 1.41mm/px · z∈[-151,+133]mm · 3 of 72 slices shown (9 of 9)]
[im 1/72]
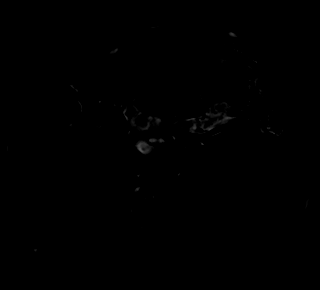
[im 36/72]
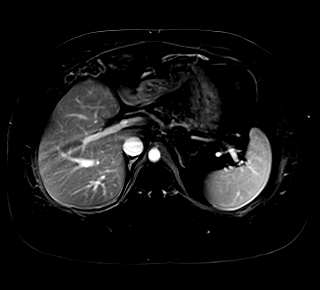
[im 72/72]
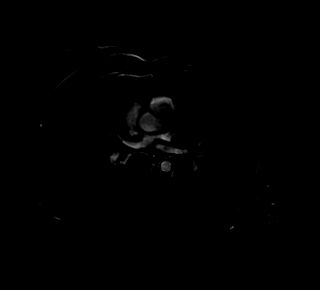

[48 of 48 positions shown; findings below may reference images not displayed]

FINDINGS: Lower chest: No acute findings.

Hepatobiliary: There is diffuse hepatic steatosis. No liver mass
identified. Status post cholecystectomy. No biliary ductal
dilatation.

Pancreas: No pancreatic inflammation, main duct dilatation or mass
identified.

Spleen:  Within normal limits in size and appearance.

Adrenals/Urinary Tract: Normal appearance of the adrenal glands. The
kidneys are unremarkable. No mass or hydronephrosis.

Stomach/Bowel: Visualized portions within the abdomen are
unremarkable.

Vascular/Lymphatic: No pathologically enlarged lymph nodes
identified. Aortic atherosclerosis. No abdominal aortic aneurysm
demonstrated.

Other:  None.

Musculoskeletal: No suspicious bone lesions identified.
IMPRESSION: 1. No acute findings within the abdomen.
2. Diffuse hepatic steatosis.
3.  Aortic Atherosclerosis ([id number]).

## 2021-02-20 ENCOUNTER — Other Ambulatory Visit: Payer: Self-pay | Admitting: Internal Medicine

## 2021-02-20 DIAGNOSIS — I10 Essential (primary) hypertension: Secondary | ICD-10-CM

## 2021-03-09 ENCOUNTER — Ambulatory Visit: Payer: BC Managed Care – PPO | Admitting: Internal Medicine

## 2021-03-09 ENCOUNTER — Encounter: Payer: Self-pay | Admitting: Internal Medicine

## 2021-03-09 ENCOUNTER — Other Ambulatory Visit (INDEPENDENT_AMBULATORY_CARE_PROVIDER_SITE_OTHER): Payer: BC Managed Care – PPO

## 2021-03-09 VITALS — BP 110/80 | HR 104 | Ht 68.5 in | Wt 271.1 lb

## 2021-03-09 DIAGNOSIS — E8881 Metabolic syndrome: Secondary | ICD-10-CM

## 2021-03-09 DIAGNOSIS — K760 Fatty (change of) liver, not elsewhere classified: Secondary | ICD-10-CM

## 2021-03-09 DIAGNOSIS — E669 Obesity, unspecified: Secondary | ICD-10-CM

## 2021-03-09 DIAGNOSIS — K222 Esophageal obstruction: Secondary | ICD-10-CM

## 2021-03-09 DIAGNOSIS — K219 Gastro-esophageal reflux disease without esophagitis: Secondary | ICD-10-CM

## 2021-03-09 LAB — COMPREHENSIVE METABOLIC PANEL
ALT: 39 U/L (ref 0–53)
AST: 23 U/L (ref 0–37)
Albumin: 4.5 g/dL (ref 3.5–5.2)
Alkaline Phosphatase: 55 U/L (ref 39–117)
BUN: 26 mg/dL — ABNORMAL HIGH (ref 6–23)
CO2: 22 mEq/L (ref 19–32)
Calcium: 9.2 mg/dL (ref 8.4–10.5)
Chloride: 104 mEq/L (ref 96–112)
Creatinine, Ser: 1.08 mg/dL (ref 0.40–1.50)
GFR: 77.06 mL/min (ref >60.00)
Glucose, Bld: 101 mg/dL — ABNORMAL HIGH (ref 70–99)
Potassium: 4.1 mEq/L (ref 3.5–5.1)
Sodium: 136 mEq/L (ref 135–145)
Total Bilirubin: 0.7 mg/dL (ref 0.2–1.2)
Total Protein: 7.3 g/dL (ref 6.0–8.3)

## 2021-03-09 LAB — LIPID PANEL
Cholesterol: 207 mg/dL — ABNORMAL HIGH (ref 0–200)
HDL: 39.4 mg/dL (ref >39.00)
LDL Cholesterol: 131 mg/dL — ABNORMAL HIGH (ref 0–99)
NonHDL: 167.41
Total CHOL/HDL Ratio: 5
Triglycerides: 184 mg/dL — ABNORMAL HIGH (ref 0.0–149.0)
VLDL: 36.8 mg/dL (ref 0.0–40.0)

## 2021-03-09 LAB — C-REACTIVE PROTEIN: CRP: <1 mg/dL (ref 0.5–20.0)

## 2021-03-09 NOTE — Progress Notes (Signed)
Taylor Schroeder 55 y.o. 11-08-1965 [id number]  Assessment & Plan:   Encounter Diagnoses  Name Primary?   GERD with stricture Yes   NAFLD (nonalcoholic fatty liver disease)    Abdominal obesity and metabolic syndrome    Severe obesity (BMI >= 40) ([name])      He will continue to work on lifestyle modification and improved eating and following a low carbohydrate diet which has helped him lose weight before and should be beneficial for his health problems as outlined above.  I will see him back in 3 months.  We will monitor the dysphagia I think it safe not to proceed to an EGD given his chronic history and the fact that he is improving.  I am hopeful weight loss will help as well. He will return on 06/08/2021  I do want to recheck a metabolic lab panel on him as follows  Orders Placed This Encounter  Procedures   Insulin, random    Standing Status:   Future    Number of Occurrences:   1    Standing Expiration Date:   03/09/2022   C-reactive protein    Standing Status:   Future    Number of Occurrences:   1    Standing Expiration Date:   03/09/2022   Comp Met (CMET)    Standing Status:   Future    Number of Occurrences:   1    Standing Expiration Date:   03/09/2022   Lipid Profile    Standing Status:   Future    Number of Occurrences:   1    Standing Expiration Date:   03/09/2022      I appreciate the opportunity to care for this patient. CC: Janith Lima, MD   Subjective:   Chief Complaint: Abdominal pain dysphagia fatty liver metabolic syndrome follow-up  HPI Darren returns, I saw him in March he had some dysphagia which he has had before he had an EGD with dilation a number of years ago and a GERD with stricture like situation.  He had his Nexium refilled to continue that he had lost weight and was continuing or trying to continue to lose weight after he had COVID but then after that he felt depressed and he ate a lot and his weight ballooned again but he is back  on track with a lower carbohydrate diet following some of the recommendations I provided before.  His wife asked eat a low-fat diet so there are some issues there.  He is back to work and busy in his [employer].  He had attempted to get disability but was denied so he is busy.  He typically skips lunch.  It is a bit difficult for breakfast because he purchases it and he will have a biscuit at times but he has cut way back on rice and potatoes and bread.  He does not drink sodas.  He is trying to keep carbohydrates under 30 g a day  Regarding the abdominal pain and bowel habit issues if he eats peanuts or sunflower seeds he will be some cramps and diarrhea or cramps and urgency without defecation i.e. like a tenesmus.  That has been predictable over the years.  He did have some ileitis a number of years ago that was very nonspecific and we thought he might of had Crohn's disease but we do not have definitive proof.  Social history notable for his wife being told she might have 5 years left to live  due to congestive heart failure and what sounds like a ASD or VSD issue.  She is in the heart failure clinic and is going to see [facility] though surgical repair is an iffy option apparently at best.  He still has a little bit of dysphagia it was generally to things like [place] fries or rice meat is not a problem.  He is not inclined to pursue an EGD right now. Allergies  Allergen Reactions   Allegra [Fexofenadine Hcl] Hives   Hctz [Hydrochlorothiazide] Other (See Comments)    Weak and muscle cramps    Demerol [Meperidine] Nausea And Vomiting    "Bad things" happen when I take it"   Current Meds  Medication Sig   albuterol (VENTOLIN HFA) 108 (90 Base) MCG/ACT inhaler INHALE 2 PUFFS INTO THE LUNGS EVERY 6 HOURS AS NEEDED FOR WHEEZING/SHORTNESS OF BREATH   [facility] D3 50 MCG (2000 UT) CAPS TAKE 2 CAPSULES BY MOUTH EVERY DAY   CVS VITAMIN B-12 2000 MCG TBCR TAKE 1 TABLET BY MOUTH EVERY DAY   esomeprazole (NEXIUM)  40 MG capsule TAKE ONE CAPSULE BY MOUTH EVERY MORNING BEFORE BREAKFAST   fluocinonide cream (LIDEX) 0.16 % Apply 1 application topically 2 (two) times daily.   indapamide (LOZOL) 1.25 MG tablet TAKE 1 TABLET BY MOUTH EVERY DAY   levocetirizine (XYZAL) 5 MG tablet TAKE 2 TABLETS (10 MG TOTAL) BY MOUTH EVERY EVENING.   Rimegepant Sulfate (NURTEC) 75 MG TBDP Take 1 tablet by mouth daily as needed.   telmisartan (MICARDIS) 80 MG tablet TAKE 1 TABLET BY MOUTH EVERY DAY   Past Medical History:  Diagnosis Date   Arthritis    "qwhere" (02/03/2017)   BPH (benign prostatic hyperplasia)    Childhood asthma    "as a baby"   Coronary artery disease Non-obstructive   a. nonobs cath in 2006;  b. 01/2013 Cath: LM nl, LAD 67m LCX nl, RCA non-dom, nl. EF 60-65%.   COVID    Esophageal ring, acquired 07/12/2013   GERD (gastroesophageal reflux disease)    H/O hiatal hernia    "think so" (02/06/2014)   Headache    "resolved w/BP control in 11/2015" (02/03/2017)   High cholesterol    History of kidney stones    Hypertension    Ileitis 2014   Suspected Crohn's, think not as time has gone on   NAFLD (nonalcoholic fatty liver disease) 03/12/2020   Obesity    Pernicious anemia-B12 deficiency 04/18/2013   PONV (postoperative nausea and vomiting) 02/03/2017; 08/2016   Sleep apnea    cpap every night   Past Surgical History:  Procedure Laterality Date   APPENDECTOMY     CARDIAC CATHETERIZATION  01/2013   "results were clear"   CARPAL TUNNEL RELEASE Right    CHOLECYSTECTOMY N/A 02/03/2017   Procedure: LAPAROSCOPIC CHOLECYSTECTOMY;  Surgeon: WGreer Pickerel MD;  [place]  Service: General;  Laterality: N/A;   COLONOSCOPY  ~ 2014   COLONOSCOPY     ESOPHAGOGASTRODUODENOSCOPY  ~ 2014   INGUINAL HERNIA REPAIR  1966   "? side"   KNEE ARTHROPLASTY Left 02/06/2014   Procedure: COMPUTER ASSISTED TOTAL KNEE ARTHROPLASTY;  Surgeon: MMarybelle Killings MD;  [place]  Service: Orthopedics;  Laterality: Left;   Cemented Left Total Knee Arthroplasty   KNEE ARTHROSCOPY Left 08/2016   scar tissue cleaned out   LAPAROSCOPIC CHOLECYSTECTOMY  02/03/2017   LEFT HEART CATHETERIZATION WITH CORONARY ANGIOGRAM N/A 01/29/2013   Procedure: LEFT HEART CATHETERIZATION WITH CORONARY ANGIOGRAM;  Surgeon: Thayer Headings, MD;  Location: [facility] CATH LAB;  Service: Cardiovascular;  Laterality: N/A;   MULTIPLE TOOTH EXTRACTIONS  09/2016   REVERSE SHOULDER ARTHROPLASTY Left 07/31/2019   Procedure: LEFT REVERSE SHOULDER ARTHROPLASTY;  Surgeon: Meredith Pel, MD;  Location: [place];  Service: Orthopedics;  Laterality: Left;   SHOULDER ARTHROSCOPY Left 03/12/2013   TOTAL KNEE ARTHROPLASTY Left 02/06/2014   TOTAL KNEE ARTHROPLASTY Right 09/14/2017   TOTAL KNEE ARTHROPLASTY Right 09/14/2017   Procedure: RIGHT TOTAL KNEE ARTHROPLASTY;  Surgeon: [name], MD;  Location: [place];  Service: Orthopedics;  Laterality: Right;   Social History   Social History Narrative   Married, 2 daughters   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   Review of Systems As above  Objective:   Physical Exam BP 110/80 (BP Location: Left Arm, Patient Position: Sitting, Cuff Size: Normal)   Pulse (!) 104   Ht 5' 8.5" (1.74 m) Comment: height measured without shoes-was bowed legged had surgery  Wt 271 lb 2 oz (123 kg)   BMI 40.62 kg/m  Abdominal adiposity persists

## 2021-03-09 NOTE — Patient Instructions (Signed)
Keep up the good job of healthy eating.  Your provider has requested that you go to the basement level for lab work before leaving today. Press "B" on the elevator. The lab is located at the first door on the left as you exit the elevator.  Due to recent changes in healthcare laws, you may see the results of your imaging and laboratory studies on MyChart before your provider has had a chance to review them.  We understand that in some cases there may be results that are confusing or concerning to you. Not all laboratory results come back in the same time frame and the provider may be waiting for multiple results in order to interpret others.  Please give [name] 48 hours in order for your provider to thoroughly review all the results before contacting the office for clarification of your results.   Follow up with [name] in 3 months.  I appreciate the opportunity to care for you. Silvano Rusk, MD, [facility]

## 2021-03-10 LAB — INSULIN, RANDOM: Insulin: 20 u[IU]/mL — ABNORMAL HIGH

## 2021-05-23 ENCOUNTER — Other Ambulatory Visit: Payer: Self-pay | Admitting: Internal Medicine

## 2021-05-23 DIAGNOSIS — I10 Essential (primary) hypertension: Secondary | ICD-10-CM

## 2021-06-08 ENCOUNTER — Ambulatory Visit: Payer: BC Managed Care – PPO | Admitting: Internal Medicine

## 2021-07-22 ENCOUNTER — Telehealth (INDEPENDENT_AMBULATORY_CARE_PROVIDER_SITE_OTHER): Payer: BC Managed Care – PPO | Admitting: Internal Medicine

## 2021-07-22 ENCOUNTER — Encounter: Payer: Self-pay | Admitting: Internal Medicine

## 2021-07-22 DIAGNOSIS — J111 Influenza due to unidentified influenza virus with other respiratory manifestations: Secondary | ICD-10-CM | POA: Diagnosis not present

## 2021-07-22 MED ORDER — OSELTAMIVIR PHOSPHATE 75 MG PO CAPS
75.0000 mg | ORAL_CAPSULE | Freq: Two times a day (BID) | ORAL | 0 refills | Status: AC
Start: 2021-07-22 — End: 2021-07-27

## 2021-07-22 NOTE — Progress Notes (Signed)
Virtual Visit via Video Note  I connected with Taylor Schroeder on 07/22/21 at  1:00 PM EDT by a video enabled telemedicine application and verified that I am speaking with the correct person using two identifiers.   I discussed the limitations of evaluation and management by telemedicine and the availability of in person appointments. The patient expressed understanding and agreed to proceed.  Present for the visit:  Myself, Dr Billey Gosling, [name].  The patient is currently at home and I am in the office.    No referring provider.    History of Present Illness: He is here for an acute visit for flu like symptoms.  His granddaughter  and mother tested positive for flu A.  His wife does not have it.    His symptoms started yesterday.    He is experiencing fever, fatigue, myalgias, headaches, cough that is productive, had wheezing that resolved with cough, nausea, congestion, sinus pain and ST.    He has tried taking vicks dayquil and nyquil pak.     Review of Systems  Constitutional:  Positive for fever and malaise/fatigue. Negative for chills.  HENT:  Positive for congestion, sinus pain and sore throat. Negative for ear pain.   Respiratory:  Positive for cough and sputum production. Negative for shortness of breath and wheezing.   Cardiovascular:  Negative for chest pain.  Gastrointestinal:  Positive for nausea. Negative for diarrhea.  Musculoskeletal:  Positive for myalgias.  Neurological:  Positive for headaches.     Social History   Socioeconomic History   Marital status: Married    Spouse name: Not on file   Number of children: 2   Years of education: Not on file   Highest education level: Not on file  Occupational History   Occupation: MAINTENANCE    Employer: [employer]  Tobacco Use   Smoking status: Former    Packs/day: 0.00    Years: 30.00    Pack years: 0.00    Types: Cigarettes    Quit date: 08/29/2008    Years since quitting: 12.9    Smokeless tobacco: Never  Vaping Use   Vaping Use: Never used  Substance and Sexual Activity   Alcohol use: No    Comment: 02/03/2017 "maybe 12 pack of beer all at one time but only once/year"; 02/06/2014 "couple beers a couple times/month"   Drug use: No   Sexual activity: Yes  Other Topics Concern   Not on file  Social History Narrative   Married, 2 daughters   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   Rare alcohol, former smoker no drugs no other tobacco   Social Determinants of Radio broadcast assistant Strain: Not on file  Food Insecurity: Not on file  Transportation Needs: Not on file  Physical Activity: Not on file  Stress: Not on file  Social Connections: Not on file     Observations/Objective: Appears well in NAD Sounds congested, occasional cough Breathing normally Skin appears warm and dry  Assessment and Plan:  Flu: Acute Two exposures in past 2 days Symptoms c/w flu Start tamiflu 75 mg BID x 5 days Otc cold medications Rest, fluids Call if no improvement or concerns   Prophylactic tamiflu sent in for wife who is also a patient of Dr Ronnald Ramp   Follow Up Instructions:    I discussed the assessment and treatment plan with the patient. The patient was provided an opportunity to ask questions and all  were answered. The patient agreed with the plan and demonstrated an understanding of the instructions.   The patient was advised to call back or seek an in-person evaluation if the symptoms worsen or if the condition fails to improve as anticipated.    Binnie Rail, MD

## 2021-07-28 ENCOUNTER — Ambulatory Visit: Payer: Self-pay

## 2021-07-28 ENCOUNTER — Ambulatory Visit (INDEPENDENT_AMBULATORY_CARE_PROVIDER_SITE_OTHER): Payer: BC Managed Care – PPO | Admitting: Orthopaedic Surgery

## 2021-07-28 ENCOUNTER — Other Ambulatory Visit: Payer: Self-pay

## 2021-07-28 ENCOUNTER — Encounter: Payer: Self-pay | Admitting: Orthopaedic Surgery

## 2021-07-28 VITALS — BP 138/93 | Ht 69.0 in | Wt 270.0 lb

## 2021-07-28 DIAGNOSIS — G8929 Other chronic pain: Secondary | ICD-10-CM

## 2021-07-28 DIAGNOSIS — M25511 Pain in right shoulder: Secondary | ICD-10-CM | POA: Diagnosis not present

## 2021-07-28 NOTE — Progress Notes (Signed)
Office Visit Note   Patient: Taylor Schroeder           Date of Birth: Feb 06, 1966           MRN: [id number] Visit Date: 07/28/2021              Requested by: Janith Lima, MD [address] [place],  [place] [address] PCP: Janith Lima, MD   Assessment & Plan: Visit Diagnoses:  1. Chronic right shoulder pain     Plan: Patient's had reverse total shoulder on the left and now problems with right shoulder pain that wakes him up at night and painful with activities.  High riding head consistent with rotator cuff tear.  He needs an MRI scan to determine whether the rotator cuff could be repaired or if he has significant rotator cuff tearing with severe atrophy then reverse total shoulder may be needed at some point.  Also follow-up after MRI scan  Follow-Up Instructions: No follow-ups on file.   Orders:  Orders Placed This Encounter  Procedures   XR Shoulder Right   No orders of the defined types were placed in this encounter.     Procedures: No procedures performed   Clinical Data: No additional findings.   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  He said both knee arthroplasties done by me and a reverse total shoulder done by Dr. Marlou Sa 07/31/2019.  Patient has trouble reaching overhead with his right arm.  Left arm reaches overhead easily.  When he rolls over to the right he has increased pain.  No numbness or tingling in his hand.  Denies associated neck pain no gait disturbance.  Both knees are doing well.  Review of Systems all other systems are noncontributory to HPI.   Objective: Vital Signs: BP (!) 138/93   Ht 5\' 9"  (1.753 m)   Wt 270 lb (122.5 kg)   BMI 39.87 kg/m   Physical Exam Constitutional:      Appearance: He is well-developed.  HENT:      Head: Normocephalic and atraumatic.     Right Ear: External ear normal.     Left Ear: External ear normal.  Eyes:     Pupils: Pupils are equal, round, and reactive to light.  Neck:     Thyroid: No thyromegaly.     Trachea: No tracheal deviation.  Cardiovascular:     Rate and Rhythm: Normal rate.  Pulmonary:     Effort: Pulmonary effort is normal.     Breath sounds: No wheezing.  Abdominal:     General: Bowel sounds are normal.     Palpations: Abdomen is soft.  Musculoskeletal:     Cervical back: Neck supple.  Skin:    General: Skin is warm and dry.     Capillary Refill: Capillary refill takes less than 2 seconds.  Neurological:     Mental Status: He is alert and oriented to person, place, and time.  Psychiatric:        Behavior: Behavior normal.        Thought Content: Thought content normal.        Judgment: Judgment normal.    Ortho Exam patient has abduction to 110 degrees with pain.  Long head of the biceps  is tender.  Weakness with supraspinatus isolation testing.  Sensation the hand is intact.  Specialty Comments:  No specialty comments available.  Imaging: No results found.   PMFS History: Patient Active Problem List   Diagnosis Date Noted   Pneumonia due to COVID-19 virus 10/16/2020   Chronic pansinusitis 10/16/2020   Encounter for general adult medical examination with abnormal findings 08/20/2020   Intrinsic eczema 08/20/2020   Coital headache 05/19/2020   NAFLD (nonalcoholic fatty liver disease) 03/12/2020   OA (osteoarthritis) of shoulder 07/31/2019   Allergic rhinoconjunctivitis 01/03/2019   Seasonal allergic rhinitis due to pollen 04/11/2018   Vitamin D deficiency 12/21/2017   Primary osteoarthritis of left shoulder 06/13/2017   Sleep apnea 03/08/2017   Hyperlipidemia with target LDL less than 100 12/22/2016   Primary erectile dysfunction 11/18/2014   Essential hypertension 07/15/2014   DDD (degenerative disc disease), cervical 10/03/2013    Esophageal ring, acquired 07/12/2013   Crohn's ileitis - working dx 06/01/2013   BPH (benign prostatic hyperplasia) 04/18/2013   Pernicious anemia-B12 deficiency 04/18/2013   Severe obesity (BMI >= 40) ([place]) 12/21/2012   GERD (gastroesophageal reflux disease) 12/21/2012   Hyperglycemia 08/29/2012   Routine general medical examination at a health care facility 08/29/2012   Past Medical History:  Diagnosis Date   Arthritis    "qwhere" (02/03/2017)   BPH (benign prostatic hyperplasia)    Childhood asthma    "as a baby"   Coronary artery disease Non-obstructive   a. nonobs cath in 2006;  b. 01/2013 Cath: LM nl, LAD 35m, LCX nl, RCA non-dom, nl. EF 60-65%.   COVID    Esophageal ring, acquired 07/12/2013   GERD (gastroesophageal reflux disease)    H/O hiatal hernia    "think so" (02/06/2014)   Headache    "resolved w/BP control in 11/2015" (02/03/2017)   High cholesterol    History of kidney stones    Hypertension    Ileitis 2014   Suspected Crohn's, think not as time has gone on   NAFLD (nonalcoholic fatty liver disease) 03/12/2020   Obesity    Pernicious anemia-B12 deficiency 04/18/2013   PONV (postoperative nausea and vomiting) 02/03/2017; 08/2016   Sleep apnea    cpap every night    Family History  Problem Relation Age of Onset   Hypertension Mother    Cancer Father        type unknown   COPD Other    Emphysema Maternal Grandmother    Emphysema Maternal Grandfather    Other Paternal Grandmother        spinal cancer   Alcohol abuse Neg Hx    Diabetes Neg Hx    Early death Neg Hx    Heart disease Neg Hx    Hyperlipidemia Neg Hx    Kidney disease Neg Hx    Stroke Neg Hx    Colon cancer Neg Hx    Stomach cancer Neg Hx    Esophageal cancer Neg Hx    Rectal cancer Neg Hx    Liver cancer Neg Hx     Past Surgical History:  Procedure Laterality Date   APPENDECTOMY     CARDIAC CATHETERIZATION  01/2013   "results were clear"   CARPAL TUNNEL RELEASE Right     CHOLECYSTECTOMY N/A 02/03/2017   Procedure: LAPAROSCOPIC CHOLECYSTECTOMY;  Surgeon: Greer Pickerel, MD;  Location: [place];  Service: General;  Laterality: N/A;   COLONOSCOPY  ~ 2014   COLONOSCOPY     ESOPHAGOGASTRODUODENOSCOPY  ~ 2014  INGUINAL HERNIA REPAIR  1966   "? side"   KNEE ARTHROPLASTY Left 02/06/2014   Procedure: COMPUTER ASSISTED TOTAL KNEE ARTHROPLASTY;  Surgeon: [name], MD;  Location: [place];  Service: Orthopedics;  Laterality: Left;  Cemented Left Total Knee Arthroplasty   KNEE ARTHROSCOPY Left 08/2016   scar tissue cleaned out   LAPAROSCOPIC CHOLECYSTECTOMY  02/03/2017   LEFT HEART CATHETERIZATION WITH CORONARY ANGIOGRAM N/A 01/29/2013   Procedure: LEFT HEART CATHETERIZATION WITH CORONARY ANGIOGRAM;  Surgeon: Thayer Headings, MD;  [facility];  Service: Cardiovascular;  Laterality: N/A;   MULTIPLE TOOTH EXTRACTIONS  09/2016   REVERSE SHOULDER ARTHROPLASTY Left 07/31/2019   Procedure: LEFT REVERSE SHOULDER ARTHROPLASTY;  Surgeon: Meredith Pel, MD;  Location: [place];  Service: Orthopedics;  Laterality: Left;   SHOULDER ARTHROSCOPY Left 03/12/2013   TOTAL KNEE ARTHROPLASTY Left 02/06/2014   TOTAL KNEE ARTHROPLASTY Right 09/14/2017   TOTAL KNEE ARTHROPLASTY Right 09/14/2017   Procedure: RIGHT TOTAL KNEE ARTHROPLASTY;  Surgeon: [name], MD;  Location: [place];  Service: Orthopedics;  Laterality: Right;   Social History   Occupational History   Occupation: MAINTENANCE    Employer: [employer]  Tobacco Use   Smoking status: Former    Packs/day: 0.00    Years: 30.00    Pack years: 0.00    Types: Cigarettes    Quit date: 08/29/2008    Years since quitting: 12.9   Smokeless tobacco: Never  Vaping Use   Vaping Use: Never used  Substance and Sexual Activity   Alcohol use: No    Comment: 02/03/2017 "maybe 12 pack of beer all at one time but only once/year"; 02/06/2014 "couple beers a couple times/month"   Drug use: No   Sexual activity: Yes

## 2021-07-28 NOTE — Addendum Note (Signed)
Addended by: Meyer Cory on: 07/28/2021 02:19 PM   Modules accepted: Orders

## 2021-08-16 ENCOUNTER — Ambulatory Visit
Admission: RE | Admit: 2021-08-16 | Discharge: 2021-08-16 | Disposition: A | Discharge status: Home / Self Care | Payer: BC Managed Care – PPO | Source: Ambulatory Visit | Attending: Orthopaedic Surgery | Admitting: Orthopaedic Surgery

## 2021-08-16 DIAGNOSIS — G8929 Other chronic pain: Secondary | ICD-10-CM

## 2021-08-16 DIAGNOSIS — M25511 Pain in right shoulder: Secondary | ICD-10-CM

## 2021-08-19 ENCOUNTER — Ambulatory Visit (INDEPENDENT_AMBULATORY_CARE_PROVIDER_SITE_OTHER): Payer: BC Managed Care – PPO | Admitting: Orthopedic Surgery

## 2021-08-19 ENCOUNTER — Other Ambulatory Visit: Payer: Self-pay

## 2021-08-19 DIAGNOSIS — G8929 Other chronic pain: Secondary | ICD-10-CM

## 2021-08-19 DIAGNOSIS — M25511 Pain in right shoulder: Secondary | ICD-10-CM

## 2021-08-23 ENCOUNTER — Encounter: Payer: Self-pay | Admitting: Orthopedic Surgery

## 2021-08-23 NOTE — Progress Notes (Signed)
Office Visit Note   Patient: Taylor Schroeder           Date of Birth: September 25, 1966           MRN: [id number] Visit Date: 08/19/2021 Requested by: Janith Lima, MD [address] [place],  [place] [address] PCP: Janith Lima, MD  Subjective: Chief Complaint  Patient presents with   Other     Scan review    HPI: Taylor Schroeder is a 55 year old patient with right shoulder pain.  He fell on his left shoulder about 3 months ago.  MRI scan is reviewed.  He has full-thickness full width tear of the supraspinatus with retraction of about 2-1/2 cm.  Also thinning of the distal subscap tendon with biceps tendon medially dislocated and AC joint marrow edema.  Patient reports pain more than weakness.  He had left reverse shoulder replacement which she is doing well with.  He is very counseled today to avoid lifting more than 20 pounds with that left shoulder.              ROS: All systems reviewed are negative as they relate to the chief complaint within the history of present illness.  Patient denies  fevers or chills.   Assessment & Plan: Visit Diagnoses:  1. Chronic right shoulder pain     Plan: Impression is right shoulder rotator cuff tear with AC joint arthritis and medial dislocation of the long head of the biceps.  Arthritis is mild in the shoulder at this time.  Based on his young age and presence of left reverse shoulder replacement I recommended to [place] that he undergo surgery with [facility] joint resection biceps tenodesis rotator cuff repair and subacromial decompression with preservation of the CA ligament.  That would likely keep him out of work at least 3 months.  Rehabilitation discussed with the patient as well.  He will consider his options and let me know what he wants to do.  Follow-Up Instructions: Return if symptoms worsen or fail to improve.   Orders:  No orders of the defined types were placed in this encounter.  No orders of the defined types were placed in this  encounter.     Procedures: No procedures performed   Clinical Data: No additional findings.  Objective: Vital Signs: There were no vitals taken for this visit.  Physical Exam:   Constitutional: Patient appears well-developed HEENT:  Head: Normocephalic Eyes:EOM are normal Neck: Normal range of motion Cardiovascular: Normal rate Pulmonary/chest: Effort normal Neurologic: Patient is alert Skin: Skin is warm Psychiatric: Patient has normal mood and affect   Ortho Exam: Ortho exam demonstrates AC joint tenderness on the right-hand side and pain with crossarm adduction.  Patient has 5- out of 5 subscap strength and 5- out of 5 supraspinatus infraspinatus strength.  He does have some coarseness with internal and external rotation of the shoulder 90 degrees of abduction.  No restriction of passive range of motion at this time with passive range of motion of 55/90/160.  Specialty Comments:  No specialty comments available.  Imaging: No results found.   PMFS History: Patient Active Problem List   Diagnosis Date Noted   Pneumonia due to COVID-19 virus 10/16/2020   Chronic pansinusitis 10/16/2020   Encounter for general adult medical examination with abnormal findings 08/20/2020   Intrinsic eczema 08/20/2020   Coital headache 05/19/2020   NAFLD (nonalcoholic fatty liver disease) 03/12/2020   OA (osteoarthritis) of shoulder 07/31/2019   Allergic rhinoconjunctivitis 01/03/2019  Seasonal allergic rhinitis due to pollen 04/11/2018   Vitamin D deficiency 12/21/2017   Primary osteoarthritis of left shoulder 06/13/2017   Sleep apnea 03/08/2017   Hyperlipidemia with target LDL less than 100 12/22/2016   Primary erectile dysfunction 11/18/2014   Essential hypertension 07/15/2014   DDD (degenerative disc disease), cervical 10/03/2013   Esophageal ring, acquired 07/12/2013   Crohn's ileitis - working dx 06/01/2013   BPH (benign prostatic hyperplasia) 04/18/2013   Pernicious  anemia-B12 deficiency 04/18/2013   Severe obesity (BMI >= 40) ([place]) 12/21/2012   GERD (gastroesophageal reflux disease) 12/21/2012   Hyperglycemia 08/29/2012   Routine general medical examination at a health care facility 08/29/2012   Past Medical History:  Diagnosis Date   Arthritis    "qwhere" (02/03/2017)   BPH (benign prostatic hyperplasia)    Childhood asthma    "as a baby"   Coronary artery disease Non-obstructive   a. nonobs cath in 2006;  b. 01/2013 Cath: LM nl, LAD 76m, LCX nl, RCA non-dom, nl. EF 60-65%.   COVID    Esophageal ring, acquired 07/12/2013   GERD (gastroesophageal reflux disease)    H/O hiatal hernia    "think so" (02/06/2014)   Headache    "resolved w/BP control in 11/2015" (02/03/2017)   High cholesterol    History of kidney stones    Hypertension    Ileitis 2014   Suspected Crohn's, think not as time has gone on   NAFLD (nonalcoholic fatty liver disease) 03/12/2020   Obesity    Pernicious anemia-B12 deficiency 04/18/2013   PONV (postoperative nausea and vomiting) 02/03/2017; 08/2016   Sleep apnea    cpap every night    Family History  Problem Relation Age of Onset   Hypertension Mother    Cancer Father        type unknown   COPD Other    Emphysema Maternal Grandmother    Emphysema Maternal Grandfather    Other Paternal Grandmother        spinal cancer   Alcohol abuse Neg Hx    Diabetes Neg Hx    Early death Neg Hx    Heart disease Neg Hx    Hyperlipidemia Neg Hx    Kidney disease Neg Hx    Stroke Neg Hx    Colon cancer Neg Hx    Stomach cancer Neg Hx    Esophageal cancer Neg Hx    Rectal cancer Neg Hx    Liver cancer Neg Hx     Past Surgical History:  Procedure Laterality Date   APPENDECTOMY     CARDIAC CATHETERIZATION  01/2013   "results were clear"   CARPAL TUNNEL RELEASE Right    CHOLECYSTECTOMY N/A 02/03/2017   Procedure: LAPAROSCOPIC CHOLECYSTECTOMY;  Surgeon: Greer Pickerel, MD;  Location: [place];  Service: General;  Laterality: N/A;    COLONOSCOPY  ~ 2014   COLONOSCOPY     ESOPHAGOGASTRODUODENOSCOPY  ~ 2014   INGUINAL HERNIA REPAIR  1966   "? side"   KNEE ARTHROPLASTY Left 02/06/2014   Procedure: COMPUTER ASSISTED TOTAL KNEE ARTHROPLASTY;  Surgeon: [name], MD;  Location: [place];  Service: Orthopedics;  Laterality: Left;  Cemented Left Total Knee Arthroplasty   KNEE ARTHROSCOPY Left 08/2016   scar tissue cleaned out   LAPAROSCOPIC CHOLECYSTECTOMY  02/03/2017   LEFT HEART CATHETERIZATION WITH CORONARY ANGIOGRAM N/A 01/29/2013   Procedure: LEFT HEART CATHETERIZATION WITH CORONARY ANGIOGRAM;  Surgeon: Thayer Headings, MD;  [facility];  Service: Cardiovascular;  Laterality: N/A;   MULTIPLE TOOTH EXTRACTIONS  09/2016   REVERSE SHOULDER ARTHROPLASTY Left 07/31/2019   Procedure: LEFT REVERSE SHOULDER ARTHROPLASTY;  Surgeon: Meredith Pel, MD;  Location: [place];  Service: Orthopedics;  Laterality: Left;   SHOULDER ARTHROSCOPY Left 03/12/2013   TOTAL KNEE ARTHROPLASTY Left 02/06/2014   TOTAL KNEE ARTHROPLASTY Right 09/14/2017   TOTAL KNEE ARTHROPLASTY Right 09/14/2017   Procedure: RIGHT TOTAL KNEE ARTHROPLASTY;  Surgeon: [name], MD;  Location: [place];  Service: Orthopedics;  Laterality: Right;   Social History   Occupational History   Occupation: MAINTENANCE    Employer: [employer]  Tobacco Use   Smoking status: Former    Packs/day: 0.00    Years: 30.00    Pack years: 0.00    Types: Cigarettes    Quit date: 08/29/2008    Years since quitting: 12.9   Smokeless tobacco: Never  Vaping Use   Vaping Use: Never used  Substance and Sexual Activity   Alcohol use: No    Comment: 02/03/2017 "maybe 12 pack of beer all at one time but only once/year"; 02/06/2014 "couple beers a couple times/month"   Drug use: No   Sexual activity: Yes

## 2021-08-25 ENCOUNTER — Other Ambulatory Visit: Payer: Self-pay | Admitting: Internal Medicine

## 2021-08-25 DIAGNOSIS — I10 Essential (primary) hypertension: Secondary | ICD-10-CM

## 2021-08-31 ENCOUNTER — Telehealth: Payer: Self-pay | Admitting: Orthopedic Surgery

## 2021-08-31 NOTE — Telephone Encounter (Signed)
Pt submitted medical release from, long term disability forms, and $25.00 cash payment to [employer]. Accepted 08/31/21

## 2021-10-02 ENCOUNTER — Telehealth (INDEPENDENT_AMBULATORY_CARE_PROVIDER_SITE_OTHER): Payer: BC Managed Care – PPO | Admitting: Nurse Practitioner

## 2021-10-02 ENCOUNTER — Encounter: Payer: Self-pay | Admitting: Nurse Practitioner

## 2021-10-02 DIAGNOSIS — U071 COVID-19: Secondary | ICD-10-CM | POA: Diagnosis not present

## 2021-10-02 MED ORDER — NIRMATRELVIR/RITONAVIR (PAXLOVID)TABLET: 3.0000 | ORAL_TABLET | Freq: Two times a day (BID) | ORAL | 0 refills | Status: AC

## 2021-10-02 MED ORDER — ALBUTEROL SULFATE HFA 108 (90 BASE) MCG/ACT IN AERS: 2.0000 | INHALATION_SPRAY | Freq: Four times a day (QID) | RESPIRATORY_TRACT | 3 refills | Status: DC | PRN

## 2021-10-02 MED ORDER — BENZONATATE 100 MG PO CAPS: 100.0000 mg | ORAL_CAPSULE | Freq: Two times a day (BID) | ORAL | 0 refills | Status: DC | PRN

## 2021-10-02 MED ORDER — GUAIFENESIN ER 600 MG PO TB12: 600.0000 mg | ORAL_TABLET | Freq: Two times a day (BID) | ORAL | 0 refills | Status: DC

## 2021-10-02 NOTE — Progress Notes (Signed)
An audio/visual tele-health visit was completed today for this patient. I connected with  Taylor Schroeder on 10/02/21 utilizing audio/visual technology and verified that I am speaking with the correct person using two identifiers. The patient was located at their home, and I was located at the office of [place] at [facility] during the encounter. I discussed the limitations of evaluation and management by telemedicine. The patient expressed understanding and agreed to proceed.     Subjective:  Patient ID: Taylor Schroeder, male    DOB: 1966-04-09  Age: 56 y.o. MRN: [id number]  CC:  Chief Complaint  Patient presents with   Covid Positive      HPI  This patient arrives today for a virtual visit for the above.  Symptoms started approximately 2 days ago.  Patient took an at-home COVID test yesterday and it was positive.  He he has not been vaccinated.  Main symptoms include slightly productive cough, wheezing, congestion, and body aches.  He tells me he had COVID-19 pneumonia approximately 1 year ago.  Past Medical History:  Diagnosis Date   Arthritis    "qwhere" (02/03/2017)   BPH (benign prostatic hyperplasia)    Childhood asthma    "as a baby"   Coronary artery disease Non-obstructive   a. nonobs cath in 2006;  b. 01/2013 Cath: LM nl, LAD 61m LCX nl, RCA non-dom, nl. EF 60-65%.   COVID    Esophageal ring, acquired 07/12/2013   GERD (gastroesophageal reflux disease)    H/O hiatal hernia    "think so" (02/06/2014)   Headache    "resolved w/BP control in 11/2015" (02/03/2017)   High cholesterol    History of kidney stones    Hypertension    Ileitis 2014   Suspected Crohn's, think not as time has gone on   NAFLD (nonalcoholic fatty liver disease) 03/12/2020   Obesity    Pernicious anemia-B12 deficiency 04/18/2013   PONV (postoperative nausea and vomiting) 02/03/2017; 08/2016   Sleep apnea    cpap every night      Family History  Problem Relation Age of Onset    Hypertension Mother    Cancer Father        type unknown   COPD Other    Emphysema Maternal Grandmother    Emphysema Maternal Grandfather    Other Paternal Grandmother        spinal cancer   Alcohol abuse Neg Hx    Diabetes Neg Hx    Early death Neg Hx    Heart disease Neg Hx    Hyperlipidemia Neg Hx    Kidney disease Neg Hx    Stroke Neg Hx    Colon cancer Neg Hx    Stomach cancer Neg Hx    Esophageal cancer Neg Hx    Rectal cancer Neg Hx    Liver cancer Neg Hx     Social History   Social History Narrative   Married, 2 daughters   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   [employer] D3 50 MCG (2000 UT) CAPS TAKE 2 CAPSULES BY MOUTH EVERY DAY   [employer]S VITAMIN B-12 2000 MCG TBCR TAKE 1 TABLET BY MOUTH EVERY DAY   esomeprazole (NEXIUM) 40 MG capsule TAKE ONE CAPSULE BY MOUTH EVERY MORNING BEFORE BREAKFAST   fluocinonide cream (LIDEX) 1.44 % Apply 1 application topically 2 (two) times daily.   guaiFENesin (MUCINEX) 600 MG 12 hr tablet Take 1 tablet (600 mg total) by mouth 2 (two) times daily.   ibuprofen (ADVIL) 800 MG tablet Take 800 mg by mouth daily.   levocetirizine (XYZAL) 5 MG tablet TAKE 2 TABLETS (10 MG TOTAL) BY MOUTH EVERY EVENING.   nirmatrelvir/ritonavir EUA (PAXLOVID) 20 x 150 MG & 10 x 100MG TABS Take 3 tablets by mouth 2 (two) times daily for 5  days. (Take nirmatrelvir 150 mg two tablets twice daily for 5 days and ritonavir 100 mg one tablet twice daily for 5 days) Patient GFR is 77   Rimegepant Sulfate (NURTEC) 75 MG TBDP Take 1 tablet by mouth daily as needed.   telmisartan (MICARDIS) 80 MG tablet TAKE 1 TABLET BY MOUTH EVERY DAY   [DISCONTINUED] albuterol (VENTOLIN HFA) 108 (90 Base) MCG/ACT inhaler INHALE 2 PUFFS INTO THE LUNGS EVERY 6 HOURS AS NEEDED FOR WHEEZING/SHORTNESS OF BREATH    ROS:  Review of Systems  Constitutional:  Positive for chills.  HENT:  Positive for congestion. Negative for sore throat.   Respiratory:  Positive for cough, sputum production (scant amount) and wheezing. Negative for shortness of breath.   Cardiovascular:  Negative for chest pain.  Gastrointestinal:  Positive for abdominal pain and diarrhea.  Musculoskeletal:  Positive for myalgias.  Neurological:  Positive for headaches.    Objective:   Today's Vitals: There were no vitals taken for this visit. Vitals with BMI 10/02/2021 07/28/2021 03/09/2021  Height - 5' 9" 5' 8.5"  Weight - 270 lbs 271 lbs 2 oz  BMI - 31.54 00.86  Systolic (No Data) 761 950  Diastolic (No Data) 93 80  Pulse - - 104     Physical Exam Comprehensive physical exam not completed today as office visit was conducted remotely.  Patient looked fairly well over video, he was appropriately groomed.  He was able to speak in complete sentences without having to stop to breathe.  He did not appear to be in any acute distress.  He did cough a couple times during the visit..  Patient was alert and oriented, and appeared to have appropriate judgment.       Assessment and Plan   1. COVID-19      Plan: 1.  We will treat with antiviral medication Paxil bid, patient's most recent EGFR was collected within the last year and it was greater than 24.  We will also prescribe Tessalon Perles for cough suppression, guaifenesin for expectorant, and albuterol inhaler that he can use as  needed for any wheezing.  He was told that if his symptoms worsen over the weekend especially if he experiences high fevers, shortness of breath, chest pain, or significant fatigue that he should go to the hospital.  He reports understanding.   Tests ordered No orders of the defined types  were placed in this encounter.     Meds ordered this encounter  Medications   nirmatrelvir/ritonavir EUA (PAXLOVID) 20 x 150 MG & 10 x 100MG TABS    Sig: Take 3 tablets by mouth 2 (two) times daily for 5 days. (Take nirmatrelvir 150 mg two tablets twice daily for 5 days and ritonavir 100 mg one tablet twice daily for 5 days) Patient GFR is 77    Dispense:  30 tablet    Refill:  0    Order Specific Question:   Supervising Provider    Answer:   BURNS, Claudina Lick [[id number]]   albuterol (VENTOLIN HFA) 108 (90 Base) MCG/ACT inhaler    Sig: Inhale 2 puffs into the lungs every 6 (six) hours as needed for wheezing or shortness of breath.    Dispense:  6.7 each    Refill:  3    Order Specific Question:   Supervising Provider    Answer:   BURNS, Claudina Lick [[id number]]   benzonatate (TESSALON) 100 MG capsule    Sig: Take 1 capsule (100 mg total) by mouth 2 (two) times daily as needed for cough.    Dispense:  20 capsule    Refill:  0    Order Specific Question:   Supervising Provider    Answer:   BURNS, Claudina Lick [[id number]]   guaiFENesin (MUCINEX) 600 MG 12 hr tablet    Sig: Take 1 tablet (600 mg total) by mouth 2 (two) times daily.    Dispense:  20 tablet    Refill:  0    Order Specific Question:   Supervising Provider    Answer:   Binnie Rail [id number]    Patient to follow-up as scheduled or sooner as needed.  Total time spent on video today was 13 minutes and 50 seconds.  Ailene Ards, NP

## 2021-11-23 ENCOUNTER — Other Ambulatory Visit: Payer: Self-pay | Admitting: Internal Medicine

## 2021-11-23 DIAGNOSIS — I10 Essential (primary) hypertension: Secondary | ICD-10-CM

## 2021-12-08 ENCOUNTER — Ambulatory Visit: Payer: BC Managed Care – PPO | Admitting: Internal Medicine

## 2021-12-08 ENCOUNTER — Other Ambulatory Visit: Payer: Self-pay

## 2021-12-08 ENCOUNTER — Encounter: Payer: Self-pay | Admitting: Internal Medicine

## 2021-12-08 VITALS — BP 148/102 | HR 84 | Temp 97.8°F | Ht 69.0 in | Wt 281.0 lb

## 2021-12-08 DIAGNOSIS — L2084 Intrinsic (allergic) eczema: Secondary | ICD-10-CM | POA: Diagnosis not present

## 2021-12-08 DIAGNOSIS — K760 Fatty (change of) liver, not elsewhere classified: Secondary | ICD-10-CM

## 2021-12-08 DIAGNOSIS — R0789 Other chest pain: Secondary | ICD-10-CM | POA: Insufficient documentation

## 2021-12-08 DIAGNOSIS — R9431 Abnormal electrocardiogram [ECG] [EKG]: Secondary | ICD-10-CM | POA: Diagnosis not present

## 2021-12-08 DIAGNOSIS — I10 Essential (primary) hypertension: Secondary | ICD-10-CM | POA: Diagnosis not present

## 2021-12-08 DIAGNOSIS — D510 Vitamin B12 deficiency anemia due to intrinsic factor deficiency: Secondary | ICD-10-CM

## 2021-12-08 DIAGNOSIS — E785 Hyperlipidemia, unspecified: Secondary | ICD-10-CM | POA: Diagnosis not present

## 2021-12-08 DIAGNOSIS — R002 Palpitations: Secondary | ICD-10-CM | POA: Insufficient documentation

## 2021-12-08 DIAGNOSIS — Z0001 Encounter for general adult medical examination with abnormal findings: Secondary | ICD-10-CM

## 2021-12-08 DIAGNOSIS — N401 Enlarged prostate with lower urinary tract symptoms: Secondary | ICD-10-CM | POA: Diagnosis not present

## 2021-12-08 LAB — BASIC METABOLIC PANEL
BUN: 18 mg/dL (ref 6–23)
CO2: 27 mEq/L (ref 19–32)
Calcium: 9.6 mg/dL (ref 8.4–10.5)
Chloride: 102 mEq/L (ref 96–112)
Creatinine, Ser: 1.03 mg/dL (ref 0.40–1.50)
GFR: 81.14 mL/min (ref >60.00)
Glucose, Bld: 103 mg/dL — ABNORMAL HIGH (ref 70–99)
Potassium: 4.3 mEq/L (ref 3.5–5.1)
Sodium: 137 mEq/L (ref 135–145)

## 2021-12-08 LAB — URINALYSIS, ROUTINE W REFLEX MICROSCOPIC
Bilirubin Urine: NEGATIVE
Hgb urine dipstick: NEGATIVE
Ketones, ur: NEGATIVE
Leukocytes,Ua: NEGATIVE
Nitrite: NEGATIVE
RBC / HPF: NONE SEEN (ref 0–?)
Specific Gravity, Urine: 1.02 (ref 1.000–1.030)
Total Protein, Urine: NEGATIVE
Urine Glucose: NEGATIVE
Urobilinogen, UA: 0.2 (ref 0.0–1.0)
pH: 5.5 (ref 5.0–8.0)

## 2021-12-08 LAB — TSH: TSH: 3.37 u[IU]/mL (ref 0.35–5.50)

## 2021-12-08 LAB — CBC WITH DIFFERENTIAL/PLATELET
Basophils Absolute: 0 10*3/uL (ref 0.0–0.1)
Basophils Relative: 0.5 % (ref 0.0–3.0)
Eosinophils Absolute: 0.2 10*3/uL (ref 0.0–0.7)
Eosinophils Relative: 3.1 % (ref 0.0–5.0)
HCT: 43.2 % (ref 39.0–52.0)
Hemoglobin: 14.6 g/dL (ref 13.0–17.0)
Lymphocytes Relative: 35.2 % (ref 12.0–46.0)
Lymphs Abs: 2.7 10*3/uL (ref 0.7–4.0)
MCHC: 33.9 g/dL (ref 30.0–36.0)
MCV: 89.2 fl (ref 78.0–100.0)
Monocytes Absolute: 0.6 10*3/uL (ref 0.1–1.0)
Monocytes Relative: 8.2 % (ref 3.0–12.0)
Neutro Abs: 4.1 10*3/uL (ref 1.4–7.7)
Neutrophils Relative %: 53 % (ref 43.0–77.0)
Platelets: 248 10*3/uL (ref 150.0–400.0)
RBC: 4.84 Mil/uL (ref 4.22–5.81)
RDW: 13.7 % (ref 11.5–15.5)
WBC: 7.8 10*3/uL (ref 4.0–10.5)

## 2021-12-08 LAB — PSA: PSA: 2.21 ng/mL (ref 0.10–4.00)

## 2021-12-08 LAB — TROPONIN I (HIGH SENSITIVITY): High Sens Troponin I: 7 ng/L (ref 2–17)

## 2021-12-08 LAB — HEPATIC FUNCTION PANEL
ALT: 48 U/L (ref 0–53)
AST: 24 U/L (ref 0–37)
Albumin: 4.4 g/dL (ref 3.5–5.2)
Alkaline Phosphatase: 63 U/L (ref 39–117)
Bilirubin, Direct: 0.1 mg/dL (ref 0.0–0.3)
Total Bilirubin: 0.5 mg/dL (ref 0.2–1.2)
Total Protein: 6.7 g/dL (ref 6.0–8.3)

## 2021-12-08 LAB — VITAMIN B12: Vitamin B-12: 1069 pg/mL — ABNORMAL HIGH (ref 211–911)

## 2021-12-08 LAB — FOLATE: Folate: 8.3 ng/mL (ref >5.9)

## 2021-12-08 MED ORDER — LEVOCETIRIZINE DIHYDROCHLORIDE 5 MG PO TABS: 10.0000 mg | ORAL_TABLET | Freq: Every evening | ORAL | 0 refills | Status: DC

## 2021-12-08 NOTE — Patient Instructions (Addendum)
Nonspecific Chest Pain, Adult °Chest pain is an uncomfortable, tight, or painful feeling in the chest. The pain can feel like a crushing, aching, or squeezing pressure. A person can feel a burning or tingling sensation. Chest pain can also be felt in your back, neck, jaw, shoulder, or arm. This pain can be worse when you move, sneeze, or take a deep breath. °Chest pain can be caused by a condition that is life-threatening. This must be treated right away. It can also be caused by something that is not life-threatening. If you have chest pain, it can be hard to know the difference, so it is important to get help right away to make sure that you do not have a serious condition. °Some life-threatening causes of chest pain include: °Heart attack. °A tear in the body's main blood vessel (aortic dissection). °Inflammation around your heart (pericarditis). °A problem in the lungs, such as a blood clot (pulmonary embolism) or a collapsed lung (pneumothorax). °Some non life-threatening causes of chest pain include: °Heartburn. °Anxiety or stress. °Damage to the bones, muscles, and cartilage that make up your chest wall. °Pneumonia or bronchitis. °Shingles infection (varicella-zoster virus). °Your chest pain may come and go. It may also be constant. Your health care provider will do tests and other studies to find the cause of your pain. Treatment will depend on the cause of your chest pain. °Follow these instructions at home: °Medicines °Take over-the-counter and prescription medicines only as told by your health care provider. °If you were prescribed an antibiotic medicine, take it as told by your health care provider. Do not stop taking the antibiotic even if you start to feel better. °Activity °Avoid any activities that cause chest pain. °Do not lift anything that is heavier than 10 lb (4.5 kg), or the limit that you are told, until your health care provider says that it is safe. °Rest as directed by your health care  provider. °Return to your normal activities only as told by your health care provider. Ask your health care provider what activities are safe for you. °Lifestyle °  °Do not use any products that contain nicotine or tobacco, such as cigarettes, e-cigarettes, and chewing tobacco. If you need help quitting, ask your health care provider. °Do not drink alcohol. °Make healthy lifestyle changes as recommended. These may include: °Getting regular exercise. Ask your health care provider to suggest some exercises that are safe for you. °Eating a heart-healthy diet. This includes plenty of fresh fruits and vegetables, whole grains, low-fat (lean) protein, and low-fat dairy products. A dietitian can help you find healthy eating options. °Maintaining a healthy weight. °Managing any other health conditions you may have, such as high blood pressure (hypertension) or diabetes. °Reducing stress, such as with yoga or relaxation techniques. °General instructions °Pay attention to any changes in your symptoms. °It is up to you to get the results of any tests that were done. Ask your health care provider, or the department that is doing the tests, when your results will be ready. °Keep all follow-up visits as told by your health care provider. This is important. °You may be asked to go for further testing if your chest pain does not go away. °Contact a health care provider if: °Your chest pain does not go away. °You feel depressed. °You have a fever. °You notice changes in your symptoms or develop new symptoms. °Get help right away if: °Your chest pain gets worse. °You have a cough that gets worse, or you cough up   blood. °You have severe pain in your abdomen. °You faint. °You have sudden, unexplained chest discomfort. °You have sudden, unexplained discomfort in your arms, back, neck, or jaw. °You have shortness of breath at any time. °You suddenly start to sweat, or your skin gets clammy. °You feel nausea or you vomit. °You suddenly  feel lightheaded or dizzy. °You have severe weakness, or unexplained weakness or fatigue. °Your heart begins to beat quickly, or it feels like it is skipping beats. °These symptoms may represent a serious problem that is an emergency. Do not wait to see if the symptoms will go away. Get medical help right away. Call your local emergency services (911 in the [place] Do not drive yourself to the hospital. °Summary °Chest pain can be caused by a condition that is serious and requires urgent treatment. It may also be caused by something that is not life-threatening. °Your health care provider may do lab tests and other studies to find the cause of your pain. °Follow your health care provider's instructions on taking medicines, making lifestyle changes, and getting emergency treatment if symptoms become worse. °Keep all follow-up visits as told by your health care provider. This includes visits for any further testing if your chest pain does not go away. °This information is not intended to replace advice given to you by your health care provider. Make sure you discuss any questions you have with your health care provider. °Document Revised: 11/27/2020 Document Reviewed: 11/27/2020 °Elsevier Patient Education © 2022 [employer] ° °

## 2021-12-08 NOTE — Progress Notes (Signed)
Subjective:  Patient ID: Taylor Schroeder, male    DOB: 05/31/1966  Age: 56 y.o. MRN: [id number]  CC: Hypertension and Annual Exam  This visit occurred during the SARS-CoV-2 public health emergency.  Safety protocols were in place, including screening questions prior to the visit, additional usage of staff PPE, and extensive cleaning of exam room while observing appropriate contact time as indicated for disinfecting solutions.    HPI Taylor Schroeder presents for a CPX and f/up -   He had an episode of chest pain about 3 days ago.  He describs it as a thumping sensation under his sternum.  He has also had some palpitations that he describes as skipped beats.  He says the symptoms have caused him to panic but he denies dizziness, lightheadedness, diaphoresis, or near syncope.  He denies cough, shortness of breath, pleuritic discomfort, or edema.  He complains of fatigue and weight gain.  Outpatient Medications Prior to Visit  Medication Sig Dispense Refill   albuterol (VENTOLIN HFA) 108 (90 Base) MCG/ACT inhaler Inhale 2 puffs into the lungs every 6 (six) hours as needed for wheezing or shortness of breath. 6.7 each 3   [facility]S D3 50 MCG (2000 UT) CAPS TAKE 2 CAPSULES BY MOUTH EVERY DAY 180 capsule 1   CVS VITAMIN B-12 2000 MCG TBCR TAKE 1 TABLET BY MOUTH EVERY DAY 90 tablet 1   esomeprazole (NEXIUM) 40 MG capsule TAKE ONE CAPSULE BY MOUTH EVERY MORNING BEFORE BREAKFAST 90 capsule 3   fluocinonide cream (LIDEX) 0.05 % Apply 1 application topically 2 (two) times daily. 120 g 1   Rimegepant Sulfate (NURTEC) 75 MG TBDP Take 1 tablet by mouth daily as needed. 16 tablet 0   telmisartan (MICARDIS) 80 MG tablet TAKE 1 TABLET BY MOUTH EVERY DAY 90 tablet 0   benzonatate (TESSALON) 100 MG capsule Take 1 capsule (100 mg total) by mouth 2 (two) times daily as needed for cough. 20 capsule 0   guaiFENesin (MUCINEX) 600 MG 12 hr tablet Take 1 tablet (600 mg total) by mouth 2 (two) times daily. 20 tablet 0    ibuprofen (ADVIL) 800 MG tablet Take 800 mg by mouth daily.     indapamide (LOZOL) 1.25 MG tablet TAKE 1 TABLET BY MOUTH EVERY DAY 90 tablet 0   levocetirizine (XYZAL) 5 MG tablet TAKE 2 TABLETS (10 MG TOTAL) BY MOUTH EVERY EVENING. 180 tablet 0   No facility-administered medications prior to visit.    ROS Review of Systems  Constitutional:  Positive for fatigue and unexpected weight change (wt gain). Negative for chills, diaphoresis and fever.  HENT: Negative.  Negative for sore throat and trouble swallowing.   Eyes:  Negative for visual disturbance.  Respiratory:  Positive for apnea. Negative for cough, chest tightness, shortness of breath and wheezing.   Cardiovascular:  Positive for chest pain. Negative for palpitations and leg swelling.  Gastrointestinal:  Negative for abdominal pain, constipation, diarrhea, nausea and vomiting.  Genitourinary: Negative.  Negative for difficulty urinating, dysuria and hematuria.  Musculoskeletal:  Positive for arthralgias. Negative for back pain and myalgias.  Skin: Negative.   Neurological:  Negative for dizziness, weakness, light-headedness and headaches.  Hematological:  Negative for adenopathy. Does not bruise/bleed easily.  Psychiatric/Behavioral:  Negative for dysphoric mood, sleep disturbance and suicidal ideas. The patient is nervous/anxious.    Objective:  BP (!) 148/102 (BP Location: Right Arm, Patient Position: Sitting, Cuff Size: Large)   Pulse 84   Temp 97.8 F (36.6 C) (  Oral)   Ht 5\' 9"  (1.753 m)   Wt 281 lb (127.5 kg)   SpO2 96%   BMI 41.50 kg/m   BP Readings from Last 3 Encounters:  12/08/21 (!) 148/102  07/28/21 (!) 138/93  03/09/21 110/80    Wt Readings from Last 3 Encounters:  12/08/21 281 lb (127.5 kg)  07/28/21 270 lb (122.5 kg)  03/09/21 271 lb 2 oz (123 kg)    Physical Exam Vitals reviewed.  HENT:     Nose: Nose normal.     Mouth/Throat:     Mouth: Mucous membranes are moist.  Eyes:     General: No  scleral icterus.    Conjunctiva/sclera: Conjunctivae normal.  Cardiovascular:     Rate and Rhythm: Normal rate and regular rhythm.     Heart sounds: Normal heart sounds, S1 normal and S2 normal. No murmur heard.   No gallop.     Comments: EKG- NSR, 77 bpm Q waves in aVR and V1 are old Q wave in V2 is new No LVH or ST/T wave changes Pulmonary:     Effort: Pulmonary effort is normal.     Breath sounds: No stridor. No wheezing, rhonchi or rales.  Abdominal:     General: Abdomen is protuberant. Bowel sounds are normal. There is no distension.     Palpations: Abdomen is soft. There is no hepatomegaly, splenomegaly or mass.     Tenderness: There is no abdominal tenderness.  Genitourinary:    Comments: GU/rectal exam deferred at his request Musculoskeletal:     Cervical back: Neck supple.     Right lower leg: No edema.     Left lower leg: No edema.  Skin:    General: Skin is warm and dry.     Coloration: Skin is not pale.  Neurological:     General: No focal deficit present.     Mental Status: He is alert.  Psychiatric:        Mood and Affect: Mood normal.        Behavior: Behavior normal.    Lab Results  Component Value Date   WBC 7.8 12/08/2021   HGB 14.6 12/08/2021   HCT 43.2 12/08/2021   PLT 248.0 12/08/2021   GLUCOSE 103 (H) 12/08/2021   CHOL 207 (H) 03/09/2021   TRIG 184.0 (H) 03/09/2021   HDL 39.40 03/09/2021   LDLDIRECT 134.0 05/03/2019   LDLCALC 131 (H) 03/09/2021   ALT 48 12/08/2021   AST 24 12/08/2021   NA 137 12/08/2021   K 4.3 12/08/2021   CL 102 12/08/2021   CREATININE 1.03 12/08/2021   BUN 18 12/08/2021   CO2 27 12/08/2021   TSH 3.37 12/08/2021   PSA 2.21 12/08/2021   INR 0.95 09/07/2017   HGBA1C 5.6 05/03/2019    MR SHOULDER RIGHT WO CONTRAST  Result Date: 08/18/2021 CLINICAL DATA:  Right shoulder pain, weakness, and reduced range of motion of the last 6 months. EXAM: MRI OF THE RIGHT SHOULDER WITHOUT CONTRAST TECHNIQUE: Multiplanar,  multisequence MR imaging of the shoulder was performed. No intravenous contrast was administered. COMPARISON:  Radiographs 07/28/2021 FINDINGS: Rotator cuff: Full-thickness nearly full width tear of the supraspinatus tendon with a prominently thinned posterior portion of the supraspinatus probably attaching to the humeral head, and with more anterior portions of the supraspinatus tendon retracted 2.7 cm. There is substantial distal thinning of the subscapularis tendon compatible with partial thickness articular surface tearing, potentially with minimal full-thickness partial width tearing of the upper margin of the  subscapularis for example on image 19 series 11, and moderate to prominent distal subscapularis tendinopathy. Mild distal infraspinatus tendinopathy noted. Muscles:  Unremarkable Biceps long head: The biceps is medially dislocated in its intra-articular portion and demonstrates partial tearing as it passes adjacent to the lesser tuberosity. Acromioclavicular Joint: Fluid signal in the [facility] joint with substantial subcortical marrow edema and associated spurring. Type III acromion. As expected, there is fluid in the subacromial subdeltoid bursa. Glenohumeral Joint: Overall moderate degenerative glenohumeral arthropathy with associated spurring and chondral thinning mostly along the humeral head. Mild subcortical marrow edema along the posterosuperior glenoid. Labrum: No well-defined labral tear although the anterior labrum seems slightly blunted. Bones:  No additional significant bony findings. Other: No supplemental non-categorized findings. IMPRESSION: 1. Full-thickness nearly full width tear of the supraspinatus tendon with most of the tendon retracted about 2.7 cm. 2. Prominent partial thickness articular surface tearing of the subscapularis tendon potentially with some mild full-thickness partial width tearing superiorly. There is resulting medial displacement of the biceps tendon which demonstrates  partial tearing as it passes adjacent to the lesser tuberosity. 3. Mild distal infraspinatus tendinopathy. 4. Substantial degenerative AC joint arthropathy and moderate degenerative glenohumeral arthropathy. Electronically Signed   By: Gaylyn Rong M.D.   On: 08/18/2021 09:40    Assessment & Plan:   Kenzo was seen today for hypertension and annual exam.  Diagnoses and all orders for this visit:  Essential hypertension- His blood pressure is not adequately well controlled.  Will screen for secondary causes and endorgan damage.  I have asked him to restart indapamide and continue the ARB as well as improving his lifestyle modifications. -     Aldosterone + renin activity w/ ratio; Future -     Basic metabolic panel; Future -     TSH; Future -     Urinalysis, Routine w reflex microscopic; Future -     Hepatic function panel; Future -     EKG 12-Lead -     Hepatic function panel -     Urinalysis, Routine w reflex microscopic -     TSH -     Basic metabolic panel -     Aldosterone + renin activity w/ ratio -     indapamide (LOZOL) 1.25 MG tablet; Take 1 tablet (1.25 mg total) by mouth daily.  Intrinsic eczema -     levocetirizine (XYZAL) 5 MG tablet; Take 2 tablets (10 mg total) by mouth every evening.  Encounter for general adult medical examination with abnormal findings- Exam completed, labs reviewed, vaccines reviewed and updated, cancer screenings are up-to-date, patient education material was given. -     PSA; Future -     PSA  Pernicious anemia-B12 deficiency- His H&H, B12, and folate are normal now. -     CBC with Differential/Platelet; Future -     Vitamin B12; Future -     Folate; Future -     Folate -     Vitamin B12 -     CBC with Differential/Platelet  Hyperlipidemia with target LDL less than 100- His ASCVD risk or is up to 20%.  I recommend that he start taking statin for cardiovascular risk reduction. -     TSH; Future -     Hepatic function panel; Future -      Hepatic function panel -     TSH  Benign prostatic hyperplasia with lower urinary tract symptoms, symptom details unspecified- He is asx and his PSA is normal. -  Urinalysis, Routine w reflex microscopic; Future -     Urinalysis, Routine w reflex microscopic  NAFLD (nonalcoholic fatty liver disease)- His liver enzymes remain elevated.  He will continue to work on his lifestyle modifications. -     Hepatic function panel; Future -     Hepatic function panel  Heart palpitations- Will check labs to screen for secondary causes.  I recommended that he undergo an event monitor to screen for dysrhythmias. -     EKG 12-Lead -     MYOCARDIAL PERFUSION IMAGING -     NT-proBNP; Future -     NT-proBNP -     Cardiac Stress Test: Informed Consent Details: Physician/Practitioner Attestation; Transcribe to consent form and obtain patient signature; Future -     CARDIAC EVENT MONITOR; Future  Other chest pain- His chest pain does not sound like it is cardiopulmonary in nature.  His labs are reassuring but he does have a new Q wave on his EKG.  I recommended that he undergo a myocardial perfusion imaging. -     Troponin I (High Sensitivity); Future -     D-dimer, quantitative; Future -     MYOCARDIAL PERFUSION IMAGING -     NT-proBNP; Future -     NT-proBNP -     D-dimer, quantitative -     Troponin I (High Sensitivity) -     Cardiac Stress Test: Informed Consent Details: Physician/Practitioner Attestation; Transcribe to consent form and obtain patient signature; Future  Abnormal electrocardiogram (ECG) (EKG) -     MYOCARDIAL PERFUSION IMAGING -     NT-proBNP; Future -     NT-proBNP -     Cardiac Stress Test: Informed Consent Details: Physician/Practitioner Attestation; Transcribe to consent form and obtain patient signature; Future  Abnormal electrocardiogram -     MYOCARDIAL PERFUSION IMAGING -     NT-proBNP; Future -     NT-proBNP -     Cardiac Stress Test: Informed Consent Details:  Physician/Practitioner Attestation; Transcribe to consent form and obtain patient signature; Future   I have discontinued Mackie Fiers. Sporer "Darren"'s ibuprofen, benzonatate, and guaiFENesin. I have also changed his indapamide. Additionally, I am having him start on rosuvastatin. Lastly, I am having him maintain his CVS D3, CVS Vitamin B-12, Nurtec, fluocinonide cream, esomeprazole, albuterol, telmisartan, and levocetirizine.  Meds ordered this encounter  Medications   levocetirizine (XYZAL) 5 MG tablet    Sig: Take 2 tablets (10 mg total) by mouth every evening.    Dispense:  180 tablet    Refill:  0   indapamide (LOZOL) 1.25 MG tablet    Sig: Take 1 tablet (1.25 mg total) by mouth daily.    Dispense:  90 tablet    Refill:  0   rosuvastatin (CRESTOR) 20 MG tablet    Sig: Take 1 tablet (20 mg total) by mouth daily.    Dispense:  90 tablet    Refill:  1     Follow-up: Return in about 6 weeks (around 01/19/2022).  Sanda Linger, MD

## 2021-12-11 MED ORDER — INDAPAMIDE 1.25 MG PO TABS: 1.2500 mg | ORAL_TABLET | Freq: Every day | ORAL | 0 refills | Status: DC

## 2021-12-11 MED ORDER — ROSUVASTATIN CALCIUM 20 MG PO TABS: 20.0000 mg | ORAL_TABLET | Freq: Every day | ORAL | 1 refills | Status: DC

## 2021-12-18 LAB — ALDOSTERONE + RENIN ACTIVITY W/ RATIO
ALDO / PRA Ratio: 1.4 ratio (ref 0.9–28.9)
Aldosterone: 1 ng/dL
Renin Activity: 0.74 ng/mL/h (ref 0.25–5.82)

## 2021-12-18 LAB — D-DIMER, QUANTITATIVE: D-Dimer, Quant: 0.29 ug{FEU}/mL

## 2021-12-18 LAB — NT-PROBNP: Pro B Natriuretic peptide (BNP): <10 pg/mL

## 2022-02-19 ENCOUNTER — Other Ambulatory Visit: Payer: Self-pay | Admitting: Internal Medicine

## 2022-02-19 DIAGNOSIS — I10 Essential (primary) hypertension: Secondary | ICD-10-CM

## 2022-02-23 ENCOUNTER — Other Ambulatory Visit: Payer: Self-pay | Admitting: Internal Medicine

## 2022-05-20 ENCOUNTER — Other Ambulatory Visit: Payer: Self-pay | Admitting: Internal Medicine

## 2022-05-20 DIAGNOSIS — I10 Essential (primary) hypertension: Secondary | ICD-10-CM

## 2022-08-18 ENCOUNTER — Other Ambulatory Visit: Payer: Self-pay | Admitting: Internal Medicine

## 2022-08-18 DIAGNOSIS — I10 Essential (primary) hypertension: Secondary | ICD-10-CM

## 2022-10-18 ENCOUNTER — Ambulatory Visit: Payer: BC Managed Care – PPO | Admitting: Family Medicine

## 2022-10-18 ENCOUNTER — Encounter: Payer: Self-pay | Admitting: Family Medicine

## 2022-10-18 VITALS — BP 134/82 | HR 75 | Temp 97.8°F | Ht 69.0 in | Wt 270.6 lb

## 2022-10-18 DIAGNOSIS — J0101 Acute recurrent maxillary sinusitis: Secondary | ICD-10-CM | POA: Diagnosis not present

## 2022-10-18 DIAGNOSIS — J9801 Acute bronchospasm: Secondary | ICD-10-CM | POA: Diagnosis not present

## 2022-10-18 MED ORDER — DOXYCYCLINE HYCLATE 100 MG PO TABS: 100.0000 mg | ORAL_TABLET | Freq: Two times a day (BID) | ORAL | 0 refills | Status: AC

## 2022-10-18 MED ORDER — ALBUTEROL SULFATE HFA 108 (90 BASE) MCG/ACT IN AERS: 2.0000 | INHALATION_SPRAY | Freq: Four times a day (QID) | RESPIRATORY_TRACT | 3 refills | Status: DC | PRN

## 2022-10-18 NOTE — Patient Instructions (Signed)
Please follow up if symptoms do not improve or as needed.    Sinus Infection, Adult A sinus infection is soreness and swelling (inflammation) of your sinuses. Sinuses are hollow spaces in the bones around your face. They are located: Around your eyes. In the middle of your forehead. Behind your nose. In your cheekbones. Your sinuses and nasal passages are lined with a fluid called mucus. Mucus drains out of your sinuses. Swelling can trap mucus in your sinuses. This lets germs (bacteria, virus, or fungus) grow, which leads to infection. Most of the time, this condition is caused by a virus. What are the causes? Allergies. Asthma. Germs. Things that block your nose or sinuses. Growths in the nose (nasal polyps). Chemicals or irritants in the air. A fungus. This is rare. What increases the risk? Having a weak body defense system (immune system). Doing a lot of swimming or diving. Using nasal sprays too much. Smoking. What are the signs or symptoms? The main symptoms of this condition are pain and a feeling of pressure around the sinuses. Other symptoms include: Stuffy nose (congestion). This may make it hard to breathe through your nose. Runny nose (drainage). Soreness, swelling, and warmth in the sinuses. A cough that may get worse at night. Being unable to smell and taste. Mucus that collects in the throat or the back of the nose (postnasal drip). This may cause a sore throat or bad breath. Being very tired (fatigued). A fever. How is this diagnosed? Your symptoms. Your medical history. A physical exam. Tests to find out if your condition is short-term (acute) or long-term (chronic). Your doctor may: Check your nose for growths (polyps). Check your sinuses using a tool that has a light on one end (endoscope). Check for allergies or germs. Do imaging tests, such as an MRI or CT scan. How is this treated? Treatment for this condition depends on the cause and whether it is  short-term or long-term. If caused by a virus, your symptoms should go away on their own within 10 days. You may be given medicines to relieve symptoms. They include: Medicines that shrink swollen tissue in the nose. A spray that treats swelling of the nostrils. Rinses that help get rid of thick mucus in your nose (nasal saline washes). Medicines that treat allergies (antihistamines). Over-the-counter pain relievers. If caused by bacteria, your doctor may wait to see if you will get better without treatment. You may be given antibiotic medicine if you have: A very bad infection. A weak body defense system. If caused by growths in the nose, surgery may be needed. Follow these instructions at home: Medicines Take, use, or apply over-the-counter and prescription medicines only as told by your doctor. These may include nasal sprays. If you were prescribed an antibiotic medicine, take it as told by your doctor. Do not stop taking it even if you start to feel better. Hydrate and humidify  Drink enough water to keep your pee (urine) pale yellow. Use a cool mist humidifier to keep the humidity level in your home above 50%. Breathe in steam for 10-15 minutes, 3-4 times a day, or as told by your doctor. You can do this in the bathroom while a hot shower is running. Try not to spend time in cool or dry air. Rest Rest as much as you can. Sleep with your head raised (elevated). Make sure you get enough sleep each night. General instructions  Put a warm, moist washcloth on your face 3-4 times a day, or  as often as told by your doctor. Use nasal saline washes as often as told by your doctor. Wash your hands often with soap and water. If you cannot use soap and water, use hand sanitizer. Do not smoke. Avoid being around people who are smoking (secondhand smoke). Keep all follow-up visits. Contact a doctor if: You have a fever. Your symptoms get worse. Your symptoms do not get better within 10  days. Get help right away if: You have a very bad headache. You cannot stop vomiting. You have very bad pain or swelling around your face or eyes. You have trouble seeing. You feel confused. Your neck is stiff. You have trouble breathing. These symptoms may be an emergency. Get help right away. Call 911. Do not wait to see if the symptoms will go away. Do not drive yourself to the hospital. Summary A sinus infection is swelling of your sinuses. Sinuses are hollow spaces in the bones around your face. This condition is caused by tissues in your nose that become inflamed or swollen. This traps germs. These can lead to infection. If you were prescribed an antibiotic medicine, take it as told by your doctor. Do not stop taking it even if you start to feel better. Keep all follow-up visits. This information is not intended to replace advice given to you by your health care provider. Make sure you discuss any questions you have with your health care provider. Document Revised: 08/18/2021 Document Reviewed: 08/18/2021 Elsevier Patient Education  [place].

## 2022-10-18 NOTE — Progress Notes (Signed)
Subjective  CC:  Chief Complaint  Patient presents with   Sinus Problem    Pt stated that he has been dealing with sinus problems since las Wednesday.   Same day acute visit; PCP not available. New pt to me. Chart reviewed.   HPI: Taylor Schroeder is a 57 y.o. male who presents to the office today to address the problems listed above in the chief complaint. 57 year old with history of chronic recurrent sinusitis presents with sinus infection symptoms including right-sided dental pain, left-sided facial pressure, purulent nasal drainage, PND without fevers chills significant cough, myalgias or GI symptoms.  He reports his last infection was possibly 3 months ago.  He has been to ENT and was recommended to have sinus surgery but has not followed through.  Request albuterol inhaler just in case. Assessment  1. Acute recurrent maxillary sinusitis   2. Bronchospasm      Plan  Sinusitis, recurrent: Doxycycline, Mucinex, Advil and saline wash as needed.  Follow-up if unimproved No wheezing currently but albuterol refilled to use just in case.  Follow up: As needed Visit date not found  No orders of the defined types were placed in this encounter.  Meds ordered this encounter  Medications   doxycycline (VIBRA-TABS) 100 MG tablet    Sig: Take 1 tablet (100 mg total) by mouth 2 (two) times daily for 10 days.    Dispense:  20 tablet    Refill:  0   albuterol (VENTOLIN HFA) 108 (90 Base) MCG/ACT inhaler    Sig: Inhale 2 puffs into the lungs every 6 (six) hours as needed for wheezing or shortness of breath.    Dispense:  6.7 each    Refill:  3      I reviewed the patients updated PMH, FH, and SocHx.    Patient Active Problem List   Diagnosis Date Noted   Heart palpitations 12/08/2021   Other chest pain 12/08/2021   Abnormal electrocardiogram (ECG) (EKG) 12/08/2021   Chronic pansinusitis 10/16/2020   Encounter for general adult medical examination with abnormal findings 08/20/2020    Intrinsic eczema 08/20/2020   Coital headache 05/19/2020   NAFLD (nonalcoholic fatty liver disease) 03/12/2020   OA (osteoarthritis) of shoulder 07/31/2019   Allergic rhinoconjunctivitis 01/03/2019   Seasonal allergic rhinitis due to pollen 04/11/2018   Vitamin D deficiency 12/21/2017   Primary osteoarthritis of left shoulder 06/13/2017   Sleep apnea 03/08/2017   Hyperlipidemia with target LDL less than 100 12/22/2016   Primary erectile dysfunction 11/18/2014   Essential hypertension 07/15/2014   DDD (degenerative disc disease), cervical 10/03/2013   Esophageal ring, acquired 07/12/2013   Crohn's ileitis - working dx 06/01/2013   BPH (benign prostatic hyperplasia) 04/18/2013   Pernicious anemia-B12 deficiency 04/18/2013   Severe obesity (BMI >= 40) ([place]) 12/21/2012   GERD (gastroesophageal reflux disease) 12/21/2012   Hyperglycemia 08/29/2012   Routine general medical examination at a health care facility 08/29/2012   Current Meds  Medication Sig   CVS D3 50 MCG (2000 UT) CAPS TAKE 2 CAPSULES BY MOUTH EVERY DAY   CVS VITAMIN B-12 2000 MCG TBCR TAKE 1 TABLET BY MOUTH EVERY DAY   doxycycline (VIBRA-TABS) 100 MG tablet Take 1 tablet (100 mg total) by mouth 2 (two) times daily for 10 days.   esomeprazole (NEXIUM) 40 MG capsule TAKE ONE CAPSULE BY MOUTH EVERY MORNING BEFORE BREAKFAST   fluocinonide cream (LIDEX) 1.61 % Apply 1 application topically 2 (two) times daily.   indapamide (LOZOL) 1.25  MG tablet Take 1 tablet (1.25 mg total) by mouth daily.   levocetirizine (XYZAL) 5 MG tablet Take 2 tablets (10 mg total) by mouth every evening.   Rimegepant Sulfate (NURTEC) 75 MG TBDP Take 1 tablet by mouth daily as needed.   rosuvastatin (CRESTOR) 20 MG tablet Take 1 tablet (20 mg total) by mouth daily.   telmisartan (MICARDIS) 80 MG tablet TAKE 1 TABLET BY MOUTH EVERY DAY   [DISCONTINUED] albuterol (VENTOLIN HFA) 108 (90 Base) MCG/ACT inhaler Inhale 2 puffs into the lungs every 6 (six)  hours as needed for wheezing or shortness of breath.    Allergies: Patient is allergic to allegra [fexofenadine hcl], hctz [hydrochlorothiazide], and demerol [meperidine]. Family History: Patient family history includes COPD in an other family member; Cancer in his father; Emphysema in his maternal grandfather and maternal grandmother; Hypertension in his mother; Other in his paternal grandmother. Social History:  Patient  reports that he quit smoking about 14 years ago. His smoking use included cigarettes. He has never used smokeless tobacco. He reports that he does not drink alcohol and does not use drugs.  Review of Systems: Constitutional: Negative for fever malaise or anorexia Cardiovascular: negative for chest pain Respiratory: negative for SOB or persistent cough Gastrointestinal: negative for abdominal pain  Objective  Vitals: BP 134/82   Pulse 75   Temp 97.8 F (36.6 C)   Ht '5\' 9"'$  (1.753 m)   Wt 270 lb 9.6 oz (122.7 kg)   SpO2 96%   BMI 39.96 kg/m  General: no acute distress , A&Ox3 HEENT: PEERL, conjunctiva normal, neck is supple, bilateral sinus tenderness present, no lymphadenopathy Cardiovascular:  RRR without murmur or gallop.  Respiratory:  Good breath sounds bilaterally, CTAB with normal respiratory effort   Commons side effects, risks, benefits, and alternatives for medications and treatment plan prescribed today were discussed, and the patient expressed understanding of the given instructions. Patient is instructed to call or message via MyChart if he/she has any questions or concerns regarding our treatment plan. No barriers to understanding were identified. We discussed Red Flag symptoms and signs in detail. Patient expressed understanding regarding what to do in case of urgent or emergency type symptoms.  Medication list was reconciled, printed and provided to the patient in AVS. Patient instructions and summary information was reviewed with the patient as  documented in the AVS. This note was prepared with assistance of Dragon voice recognition software. Occasional wrong-word or sound-a-like substitutions may have occurred due to the inherent limitations of voice recognition software

## 2022-11-19 ENCOUNTER — Other Ambulatory Visit: Payer: Self-pay | Admitting: Internal Medicine

## 2022-11-19 DIAGNOSIS — I10 Essential (primary) hypertension: Secondary | ICD-10-CM

## 2022-11-23 ENCOUNTER — Other Ambulatory Visit: Payer: Self-pay | Admitting: Internal Medicine

## 2022-11-23 DIAGNOSIS — I10 Essential (primary) hypertension: Secondary | ICD-10-CM

## 2022-11-25 ENCOUNTER — Other Ambulatory Visit: Payer: Self-pay | Admitting: Internal Medicine

## 2022-11-25 ENCOUNTER — Telehealth: Payer: Self-pay | Admitting: Internal Medicine

## 2022-11-25 DIAGNOSIS — I10 Essential (primary) hypertension: Secondary | ICD-10-CM

## 2022-11-25 MED ORDER — TELMISARTAN 80 MG PO TABS: 80.0000 mg | ORAL_TABLET | Freq: Every day | ORAL | 0 refills | Status: DC

## 2022-11-25 NOTE — Telephone Encounter (Signed)
MEDICATION:telmisartan (MICARDIS) 80 MG tablet  [id number] [place], [place] - [address]  Comments: Patient said he has one pill left  **Let patient know to contact pharmacy at the end of the day to make sure medication is ready. **  ** Please notify patient to allow 48-72 hours to process**  **Encourage patient to contact the pharmacy for refills or [facility] can [facility]

## 2022-11-26 ENCOUNTER — Other Ambulatory Visit: Payer: Self-pay | Admitting: Internal Medicine

## 2022-11-26 DIAGNOSIS — I10 Essential (primary) hypertension: Secondary | ICD-10-CM

## 2022-11-26 MED ORDER — TELMISARTAN 80 MG PO TABS: 80.0000 mg | ORAL_TABLET | Freq: Every day | ORAL | 0 refills | Status: DC

## 2022-11-26 NOTE — Telephone Encounter (Signed)
Pt has scheduled an OV for 3/6 @ 11.20am. He is requesting that a refill be sent in as he out out of medication after taking his pill for this morning. Please advise.

## 2022-11-29 ENCOUNTER — Encounter: Payer: Self-pay | Admitting: Internal Medicine

## 2022-11-29 ENCOUNTER — Ambulatory Visit: Payer: BC Managed Care – PPO | Admitting: Internal Medicine

## 2022-11-29 VITALS — BP 148/98 | HR 79 | Temp 97.9°F | Resp 16 | Ht 69.0 in | Wt 279.0 lb

## 2022-11-29 DIAGNOSIS — R739 Hyperglycemia, unspecified: Secondary | ICD-10-CM | POA: Diagnosis not present

## 2022-11-29 DIAGNOSIS — R079 Chest pain, unspecified: Secondary | ICD-10-CM

## 2022-11-29 DIAGNOSIS — E785 Hyperlipidemia, unspecified: Secondary | ICD-10-CM | POA: Diagnosis not present

## 2022-11-29 DIAGNOSIS — N401 Enlarged prostate with lower urinary tract symptoms: Secondary | ICD-10-CM

## 2022-11-29 DIAGNOSIS — I10 Essential (primary) hypertension: Secondary | ICD-10-CM | POA: Diagnosis not present

## 2022-11-29 DIAGNOSIS — J4520 Mild intermittent asthma, uncomplicated: Secondary | ICD-10-CM | POA: Insufficient documentation

## 2022-11-29 DIAGNOSIS — D510 Vitamin B12 deficiency anemia due to intrinsic factor deficiency: Secondary | ICD-10-CM

## 2022-11-29 DIAGNOSIS — Z0001 Encounter for general adult medical examination with abnormal findings: Secondary | ICD-10-CM | POA: Diagnosis not present

## 2022-11-29 DIAGNOSIS — K760 Fatty (change of) liver, not elsewhere classified: Secondary | ICD-10-CM

## 2022-11-29 DIAGNOSIS — Z1159 Encounter for screening for other viral diseases: Secondary | ICD-10-CM | POA: Insufficient documentation

## 2022-11-29 DIAGNOSIS — R0989 Other specified symptoms and signs involving the circulatory and respiratory systems: Secondary | ICD-10-CM | POA: Insufficient documentation

## 2022-11-29 DIAGNOSIS — R9431 Abnormal electrocardiogram [ECG] [EKG]: Secondary | ICD-10-CM

## 2022-11-29 LAB — BASIC METABOLIC PANEL
BUN: 20 mg/dL (ref 6–23)
CO2: 27 mEq/L (ref 19–32)
Calcium: 9.6 mg/dL (ref 8.4–10.5)
Chloride: 104 mEq/L (ref 96–112)
Creatinine, Ser: 0.96 mg/dL (ref 0.40–1.50)
GFR: 87.69 mL/min (ref >60.00)
Glucose, Bld: 91 mg/dL (ref 70–99)
Potassium: 4.4 mEq/L (ref 3.5–5.1)
Sodium: 140 mEq/L (ref 135–145)

## 2022-11-29 LAB — CBC WITH DIFFERENTIAL/PLATELET
Basophils Absolute: 0 10*3/uL (ref 0.0–0.1)
Basophils Relative: 0.5 % (ref 0.0–3.0)
Eosinophils Absolute: 0.1 10*3/uL (ref 0.0–0.7)
Eosinophils Relative: 1.8 % (ref 0.0–5.0)
HCT: 43.1 % (ref 39.0–52.0)
Hemoglobin: 14.6 g/dL (ref 13.0–17.0)
Lymphocytes Relative: 33.4 % (ref 12.0–46.0)
Lymphs Abs: 2.4 10*3/uL (ref 0.7–4.0)
MCHC: 33.9 g/dL (ref 30.0–36.0)
MCV: 88.5 fl (ref 78.0–100.0)
Monocytes Absolute: 0.6 10*3/uL (ref 0.1–1.0)
Monocytes Relative: 8.4 % (ref 3.0–12.0)
Neutro Abs: 4 10*3/uL (ref 1.4–7.7)
Neutrophils Relative %: 55.9 % (ref 43.0–77.0)
Platelets: 263 10*3/uL (ref 150.0–400.0)
RBC: 4.87 Mil/uL (ref 4.22–5.81)
RDW: 13.7 % (ref 11.5–15.5)
WBC: 7.1 10*3/uL (ref 4.0–10.5)

## 2022-11-29 LAB — FOLATE: Folate: 10.9 ng/mL (ref >5.9)

## 2022-11-29 LAB — PSA: PSA: 2.71 ng/mL (ref 0.10–4.00)

## 2022-11-29 LAB — HEPATIC FUNCTION PANEL
ALT: 38 U/L (ref 0–53)
AST: 21 U/L (ref 0–37)
Albumin: 4.2 g/dL (ref 3.5–5.2)
Alkaline Phosphatase: 61 U/L (ref 39–117)
Bilirubin, Direct: 0.1 mg/dL (ref 0.0–0.3)
Total Bilirubin: 0.5 mg/dL (ref 0.2–1.2)
Total Protein: 6.8 g/dL (ref 6.0–8.3)

## 2022-11-29 LAB — TSH: TSH: 1.91 u[IU]/mL (ref 0.35–5.50)

## 2022-11-29 LAB — PROTIME-INR
INR: 1 ratio (ref 0.8–1.0)
Prothrombin Time: 11.3 s (ref 9.6–13.1)

## 2022-11-29 LAB — LIPID PANEL
Cholesterol: 189 mg/dL (ref 0–200)
HDL: 53.9 mg/dL (ref >39.00)
LDL Cholesterol: 114 mg/dL — ABNORMAL HIGH (ref 0–99)
NonHDL: 135
Total CHOL/HDL Ratio: 4
Triglycerides: 106 mg/dL (ref 0.0–149.0)
VLDL: 21.2 mg/dL (ref 0.0–40.0)

## 2022-11-29 LAB — VITAMIN B12: Vitamin B-12: 1012 pg/mL — ABNORMAL HIGH (ref 211–911)

## 2022-11-29 LAB — HEMOGLOBIN A1C: Hgb A1c MFr Bld: 5.7 % (ref 4.6–6.5)

## 2022-11-29 MED ORDER — PITAVASTATIN CALCIUM 2 MG PO TABS: 2.0000 mg | ORAL_TABLET | Freq: Every day | ORAL | 0 refills | Status: DC

## 2022-11-29 MED ORDER — AIRSUPRA 90-80 MCG/ACT IN AERO: 2.0000 | INHALATION_SPRAY | Freq: Four times a day (QID) | RESPIRATORY_TRACT | 5 refills | Status: DC | PRN

## 2022-11-29 MED ORDER — ZEPBOUND 2.5 MG/0.5ML ~~LOC~~ SOAJ: 2.5000 mg | SUBCUTANEOUS | 0 refills | Status: DC

## 2022-11-29 NOTE — Progress Notes (Signed)
Subjective:  Patient ID: Taylor Schroeder, male    DOB: 1966/02/20  Age: 57 y.o. MRN: [id number]  CC: Hypertension, Asthma, and Hyperlipidemia   HPI Taylor Schroeder presents for a CPX and f/up ----  He is not taking rosuvastatin because it caused muscle aches.  He is active and does not have any exertional symptoms but he complains of intermittent chest pain, wheezing, and shortness of breath at rest.  Outpatient Medications Prior to Visit  Medication Sig Dispense Refill   [facility] D3 50 MCG (2000 UT) CAPS TAKE 2 CAPSULES BY MOUTH EVERY DAY 180 capsule 1   esomeprazole (NEXIUM) 40 MG capsule TAKE ONE CAPSULE BY MOUTH EVERY MORNING BEFORE BREAKFAST 90 capsule 3   fluocinonide cream (LIDEX) [id number] % Apply 1 application topically 2 (two) times daily. 120 g 1   levocetirizine (XYZAL) 5 MG tablet Take 2 tablets (10 mg total) by mouth every evening. 180 tablet 0   Rimegepant Sulfate (NURTEC) 75 MG TBDP Take 1 tablet by mouth daily as needed. 16 tablet 0   telmisartan (MICARDIS) 80 MG tablet Take 1 tablet (80 mg total) by mouth daily. 90 tablet 0   albuterol (VENTOLIN HFA) 108 (90 Base) MCG/ACT inhaler Inhale 2 puffs into the lungs every 6 (six) hours as needed for wheezing or shortness of breath. 6.7 each 3   CVS VITAMIN B-12 2000 MCG TBCR TAKE 1 TABLET BY MOUTH EVERY DAY 90 tablet 1   indapamide (LOZOL) 1.25 MG tablet Take 1 tablet (1.25 mg total) by mouth daily. 90 tablet 0   rosuvastatin (CRESTOR) 20 MG tablet Take 1 tablet (20 mg total) by mouth daily. 90 tablet 1   No facility-administered medications prior to visit.    ROS Review of Systems  Constitutional: Negative.  Negative for chills, diaphoresis, fatigue and fever.  HENT: Negative.    Eyes: Negative.   Respiratory:  Positive for apnea, shortness of breath and wheezing. Negative for cough.   Cardiovascular:  Positive for chest pain. Negative for palpitations and leg swelling.  Gastrointestinal:  Negative for abdominal pain,  constipation, diarrhea, nausea and vomiting.  Endocrine: Negative.   Genitourinary:  Positive for difficulty urinating. Negative for dysuria and hematuria.       +++ weak urine stream  Musculoskeletal:  Positive for arthralgias. Negative for myalgias and neck pain.  Skin: Negative.   Neurological: Negative.  Negative for dizziness and weakness.  Hematological:  Negative for adenopathy. Does not bruise/bleed easily.  Psychiatric/Behavioral:  Positive for sleep disturbance. Negative for confusion, decreased concentration and dysphoric mood. The patient is not nervous/anxious.     Objective:  BP (!) 148/98 (BP Location: Left Leg, Patient Position: Sitting, Cuff Size: Large)   Pulse 79   Temp 97.9 F (36.6 C) (Oral)   Resp 16   Ht '5\' 9"'$  (1.753 m)   Wt 279 lb (126.6 kg)   SpO2 94%   BMI 41.20 kg/m   BP Readings from Last 3 Encounters:  11/29/22 (!) 148/98  10/18/22 134/82  12/08/21 (!) 148/102    Wt Readings from Last 3 Encounters:  11/29/22 279 lb (126.6 kg)  10/18/22 270 lb 9.6 oz (122.7 kg)  12/08/21 281 lb (127.5 kg)    Physical Exam Vitals reviewed.  Constitutional:      General: He is not in acute distress.    Appearance: Normal appearance. He is obese. He is not ill-appearing, toxic-appearing or diaphoretic.  HENT:     Nose: Nose normal.  Mouth/Throat:     Mouth: Mucous membranes are moist.  Eyes:     General: No scleral icterus.    Conjunctiva/sclera: Conjunctivae normal.  Cardiovascular:     Rate and Rhythm: Normal rate and regular rhythm.     Heart sounds: Normal heart sounds, S1 normal and S2 normal. No murmur heard.    No friction rub. No gallop.     Comments: EKG- NSR, 74 bpm Septal infarct pattern, unchanged No LVH Pulmonary:     Effort: Pulmonary effort is normal.     Breath sounds: No stridor. No wheezing, rhonchi or rales.  Abdominal:     General: Abdomen is protuberant. Bowel sounds are normal. There is no distension.     Palpations: Abdomen  is soft. There is no hepatomegaly, splenomegaly or mass.     Tenderness: There is no abdominal tenderness.     Hernia: There is no hernia in the left inguinal area or right inguinal area.  Genitourinary:    Pubic Area: No rash.      Penis: Normal and circumcised.      Testes: Normal.     Epididymis:     Right: Normal.     Left: Normal.     Prostate: Enlarged. Not tender and no nodules present.     Rectum: Normal. Guaiac result negative. No mass, tenderness, anal fissure, external hemorrhoid or internal hemorrhoid. Normal anal tone.  Musculoskeletal:     Cervical back: Neck supple.     Right lower leg: No edema.     Left lower leg: No edema.  Lymphadenopathy:     Cervical: No cervical adenopathy.     Lower Body: No right inguinal adenopathy. No left inguinal adenopathy.  Skin:    General: Skin is warm and dry.  Neurological:     General: No focal deficit present.     Mental Status: He is alert. Mental status is at baseline.  Psychiatric:        Mood and Affect: Mood normal.        Behavior: Behavior normal.     Lab Results  Component Value Date   WBC 7.1 11/29/2022   HGB 14.6 11/29/2022   HCT 43.1 11/29/2022   PLT 263.0 11/29/2022   GLUCOSE 91 11/29/2022   CHOL 189 11/29/2022   TRIG 106.0 11/29/2022   HDL 53.90 11/29/2022   LDLDIRECT 134.0 05/03/2019   LDLCALC 114 (H) 11/29/2022   ALT 38 11/29/2022   AST 21 11/29/2022   NA 140 11/29/2022   K 4.4 11/29/2022   CL 104 11/29/2022   CREATININE 0.96 11/29/2022   BUN 20 11/29/2022   CO2 27 11/29/2022   TSH 1.91 11/29/2022   PSA 2.71 11/29/2022   INR 1.0 11/29/2022   HGBA1C 5.7 11/29/2022    MR SHOULDER RIGHT WO CONTRAST  Result Date: 08/18/2021 CLINICAL DATA:  Right shoulder pain, weakness, and reduced range of motion of the last 6 months. EXAM: MRI OF THE RIGHT SHOULDER WITHOUT CONTRAST TECHNIQUE: Multiplanar, multisequence MR imaging of the shoulder was performed. No intravenous contrast was administered.  COMPARISON:  Radiographs 07/28/2021 FINDINGS: Rotator cuff: Full-thickness nearly full width tear of the supraspinatus tendon with a prominently thinned posterior portion of the supraspinatus probably attaching to the humeral head, and with more anterior portions of the supraspinatus tendon retracted 2.7 cm. There is substantial distal thinning of the subscapularis tendon compatible with partial thickness articular surface tearing, potentially with minimal full-thickness partial width tearing of the upper margin of the subscapularis  for example on image 19 series 11, and moderate to prominent distal subscapularis tendinopathy. Mild distal infraspinatus tendinopathy noted. Muscles:  Unremarkable Biceps long head: The biceps is medially dislocated in its intra-articular portion and demonstrates partial tearing as it passes adjacent to the lesser tuberosity. Acromioclavicular Joint: Fluid signal in the [facility] joint with substantial subcortical marrow edema and associated spurring. Type III acromion. As expected, there is fluid in the subacromial subdeltoid bursa. Glenohumeral Joint: Overall moderate degenerative glenohumeral arthropathy with associated spurring and chondral thinning mostly along the humeral head. Mild subcortical marrow edema along the posterosuperior glenoid. Labrum: No well-defined labral tear although the anterior labrum seems slightly blunted. Bones:  No additional significant bony findings. Other: No supplemental non-categorized findings. IMPRESSION: 1. Full-thickness nearly full width tear of the supraspinatus tendon with most of the tendon retracted about 2.7 cm. 2. Prominent partial thickness articular surface tearing of the subscapularis tendon potentially with some mild full-thickness partial width tearing superiorly. There is resulting medial displacement of the biceps tendon which demonstrates partial tearing as it passes adjacent to the lesser tuberosity. 3. Mild distal infraspinatus  tendinopathy. 4. Substantial degenerative AC joint arthropathy and moderate degenerative glenohumeral arthropathy. Electronically Signed   By: Van Clines M.D.   On: 08/18/2021 09:40    Assessment & Plan:   Kailub was seen today for hypertension, asthma and hyperlipidemia.  Diagnoses and all orders for this visit:  Essential hypertension- His blood pressure is not at the goal of 130/80.  Will continue the ARB and add restart the thiazide diuretic. -     Basic metabolic panel; Future -     CBC with Differential/Platelet; Future -     TSH; Future -     EKG 12-Lead -     TSH -     CBC with Differential/Platelet -     Basic metabolic panel -     indapamide (LOZOL) 1.25 MG tablet; Take 1 tablet (1.25 mg total) by mouth daily.  NAFLD (nonalcoholic fatty liver disease)- Liver enzymes have improved. -     Hepatic function panel; Future -     Protime-INR; Future -     Protime-INR -     Hepatic function panel  Benign prostatic hyperplasia with lower urinary tract symptoms, symptom details unspecified -     PSA; Future -     PSA  Hyperglycemia -     Basic metabolic panel; Future -     Hemoglobin A1c; Future -     Hemoglobin A1c -     Basic metabolic panel  Severe obesity (BMI >= 40) (HCC) -     TSH; Future -     TSH -     tirzepatide (ZEPBOUND) 2.5 MG/0.5ML Pen; Inject 2.5 mg into the skin once a week.  Pernicious anemia-B12 deficiency -     Vitamin B12; Future -     Folate; Future -     Folate -     Vitamin B12  Hyperlipidemia with target LDL less than 100-will try a different statin. -     Lipid panel; Future -     TSH; Future -     TSH -     Lipid panel -     Pitavastatin Calcium 2 MG TABS; Take 1 tablet (2 mg total) by mouth daily.  Encounter for general adult medical examination with abnormal findings-exam completed, labs reviewed, vaccines reviewed, cancer screenings are up-to-date, patient education was given. -     Cancel: PSA; Future -  Cancel: Hepatitis  C antibody; Future -     HIV Antibody (routine testing w rflx); Future -     Hepatitis C antibody; Future -     Hepatitis C antibody -     HIV Antibody (routine testing w rflx)  Need for hepatitis C screening test -     Cancel: Hepatitis C antibody; Future -     Hepatitis C antibody; Future -     Hepatitis C antibody  Mild intermittent asthma without complication -     Albuterol-Budesonide (AIRSUPRA) 90-80 MCG/ACT AERO; Inhale 2 puffs into the lungs 4 (four) times daily as needed.  Unequal blood pressure in upper extremities -     Cancel: CT ANGIO CHEST AORTA W/ & OR WO/CM & GATING ([place] ONLY); Future -     CT ANGIO CHEST AORTA W/CM & OR WO/CM; Future  Abnormal electrocardiogram -     MYOCARDIAL PERFUSION IMAGING; Future -     Cancel: CT ANGIO CHEST AORTA W/ & OR WO/CM & GATING ([place] ONLY); Future -     CT ANGIO CHEST AORTA W/CM & OR WO/CM; Future  Chest pain, unspecified type -     Cancel: CT ANGIO CHEST AORTA W/ & OR WO/CM & GATING ([place] ONLY); Future -     CT ANGIO CHEST AORTA W/CM & OR WO/CM; Future   I have discontinued Leola Deragon. Shirer "Darren"'s CVS Vitamin B-12, rosuvastatin, and albuterol. I am also having him start on Airsupra, Pitavastatin Calcium, and Zepbound. Additionally, I am having him maintain his CVS D3, Nurtec, fluocinonide cream, levocetirizine, esomeprazole, telmisartan, and indapamide.  Meds ordered this encounter  Medications   Albuterol-Budesonide (AIRSUPRA) 90-80 MCG/ACT AERO    Sig: Inhale 2 puffs into the lungs 4 (four) times daily as needed.    Dispense:  10.7 g    Refill:  5   Pitavastatin Calcium 2 MG TABS    Sig: Take 1 tablet (2 mg total) by mouth daily.    Dispense:  90 tablet    Refill:  0   tirzepatide (ZEPBOUND) 2.5 MG/0.5ML Pen    Sig: Inject 2.5 mg into the skin once a week.    Dispense:  4 mL    Refill:  0   indapamide (LOZOL) 1.25 MG tablet    Sig: Take 1 tablet (1.25 mg total) by mouth daily.    Dispense:  90  tablet    Refill:  0     Follow-up: Return in about 3 months (around 03/01/2023).  Scarlette Calico, MD

## 2022-11-29 NOTE — Patient Instructions (Signed)
Hypertension, Adult High blood pressure (hypertension) is when the force of blood pumping through the arteries is too strong. The arteries are the blood vessels that carry blood from the heart throughout the body. Hypertension forces the heart to work harder to pump blood and may cause arteries to become narrow or stiff. Untreated or uncontrolled hypertension can lead to a heart attack, heart failure, a stroke, kidney disease, and other problems. A blood pressure reading consists of a higher number over a lower number. Ideally, your blood pressure should be below 120/80. The first ("top") number is called the systolic pressure. It is a measure of the pressure in your arteries as your heart beats. The second ("bottom") number is called the diastolic pressure. It is a measure of the pressure in your arteries as the heart relaxes. What are the causes? The exact cause of this condition is not known. There are some conditions that result in high blood pressure. What increases the risk? Certain factors may make you more likely to develop high blood pressure. Some of these risk factors are under your control, including: Smoking. Not getting enough exercise or physical activity. Being overweight. Having too much fat, sugar, calories, or salt (sodium) in your diet. Drinking too much alcohol. Other risk factors include: Having a personal history of heart disease, diabetes, high cholesterol, or kidney disease. Stress. Having a family history of high blood pressure and high cholesterol. Having obstructive sleep apnea. Age. The risk increases with age. What are the signs or symptoms? High blood pressure may not cause symptoms. Very high blood pressure (hypertensive crisis) may cause: Headache. Fast or irregular heartbeats (palpitations). Shortness of breath. Nosebleed. Nausea and vomiting. Vision changes. Severe chest pain, dizziness, and seizures. How is this diagnosed? This condition is diagnosed by  measuring your blood pressure while you are seated, with your arm resting on a flat surface, your legs uncrossed, and your feet flat on the floor. The cuff of the blood pressure monitor will be placed directly against the skin of your upper arm at the level of your heart. Blood pressure should be measured at least twice using the same arm. Certain conditions can cause a difference in blood pressure between your right and left arms. If you have a high blood pressure reading during one visit or you have normal blood pressure with other risk factors, you may be asked to: Return on a different day to have your blood pressure checked again. Monitor your blood pressure at home for 1 week or longer. If you are diagnosed with hypertension, you may have other blood or imaging tests to help your health care provider understand your overall risk for other conditions. How is this treated? This condition is treated by making healthy lifestyle changes, such as eating healthy foods, exercising more, and reducing your alcohol intake. You may be referred for counseling on a healthy diet and physical activity. Your health care provider may prescribe medicine if lifestyle changes are not enough to get your blood pressure under control and if: Your systolic blood pressure is above 130. Your diastolic blood pressure is above 80. Your personal target blood pressure may vary depending on your medical conditions, your age, and other factors. Follow these instructions at home: Eating and drinking  Eat a diet that is high in fiber and potassium, and low in sodium, added sugar, and fat. An example of this eating plan is called the DASH diet. DASH stands for Dietary Approaches to Stop Hypertension. To eat this way: Eat   plenty of fresh fruits and vegetables. Try to fill one half of your plate at each meal with fruits and vegetables. Eat whole grains, such as whole-wheat pasta, brown rice, or whole-grain bread. Fill about one  fourth of your plate with whole grains. Eat or drink low-fat dairy products, such as skim milk or low-fat yogurt. Avoid fatty cuts of meat, processed or cured meats, and poultry with skin. Fill about one fourth of your plate with lean proteins, such as fish, chicken without skin, beans, eggs, or tofu. Avoid pre-made and processed foods. These tend to be higher in sodium, added sugar, and fat. Reduce your daily sodium intake. Many people with hypertension should eat less than 1,500 mg of sodium a day. Do not drink alcohol if: Your health care provider tells you not to drink. You are pregnant, may be pregnant, or are planning to become pregnant. If you drink alcohol: Limit how much you have to: 0-1 drink a day for women. 0-2 drinks a day for men. Know how much alcohol is in your drink. In the [place] one drink equals one 12 oz bottle of beer (355 mL), one 5 oz glass of wine (148 mL), or one 1 oz glass of hard liquor (44 mL). Lifestyle  Work with your health care provider to maintain a healthy body weight or to lose weight. Ask what an ideal weight is for you. Get at least 30 minutes of exercise that causes your heart to beat faster (aerobic exercise) most days of the week. Activities may include walking, swimming, or biking. Include exercise to strengthen your muscles (resistance exercise), such as Pilates or lifting weights, as part of your weekly exercise routine. Try to do these types of exercises for 30 minutes at least 3 days a week. Do not use any products that contain nicotine or tobacco. These products include cigarettes, chewing tobacco, and vaping devices, such as e-cigarettes. If you need help quitting, ask your health care provider. Monitor your blood pressure at home as told by your health care provider. Keep all follow-up visits. This is important. Medicines Take over-the-counter and prescription medicines only as told by your health care provider. Follow directions carefully. Blood  pressure medicines must be taken as prescribed. Do not skip doses of blood pressure medicine. Doing this puts you at risk for problems and can make the medicine less effective. Ask your health care provider about side effects or reactions to medicines that you should watch for. Contact a health care provider if you: Think you are having a reaction to a medicine you are taking. Have headaches that keep coming back (recurring). Feel dizzy. Have swelling in your ankles. Have trouble with your vision. Get help right away if you: Develop a severe headache or confusion. Have unusual weakness or numbness. Feel faint. Have severe pain in your chest or abdomen. Vomit repeatedly. Have trouble breathing. These symptoms may be an emergency. Get help right away. Call 911. Do not wait to see if the symptoms will go away. Do not drive yourself to the hospital. Summary Hypertension is when the force of blood pumping through your arteries is too strong. If this condition is not controlled, it may put you at risk for serious complications. Your personal target blood pressure may vary depending on your medical conditions, your age, and other factors. For most people, a normal blood pressure is less than 120/80. Hypertension is treated with lifestyle changes, medicines, or a combination of both. Lifestyle changes include losing weight, eating a healthy,   low-sodium diet, exercising more, and limiting alcohol. This information is not intended to replace advice given to you by your health care provider. Make sure you discuss any questions you have with your health care provider. Document Revised: 07/21/2021 Document Reviewed: 07/21/2021 Elsevier Patient Education  2023 [employer]  

## 2022-11-30 LAB — HIV ANTIBODY (ROUTINE TESTING W REFLEX): HIV 1&2 Ab, 4th Generation: NONREACTIVE

## 2022-11-30 LAB — HEPATITIS C ANTIBODY: Hepatitis C Ab: NONREACTIVE

## 2022-12-03 DIAGNOSIS — R079 Chest pain, unspecified: Secondary | ICD-10-CM | POA: Insufficient documentation

## 2022-12-03 MED ORDER — INDAPAMIDE 1.25 MG PO TABS: 1.2500 mg | ORAL_TABLET | Freq: Every day | ORAL | 0 refills | Status: DC

## 2022-12-06 ENCOUNTER — Ambulatory Visit
Admission: RE | Admit: 2022-12-06 | Discharge: 2022-12-06 | Disposition: A | Discharge status: Home / Self Care | Payer: BC Managed Care – PPO | Source: Ambulatory Visit | Attending: Internal Medicine | Admitting: Internal Medicine

## 2022-12-06 DIAGNOSIS — R9431 Abnormal electrocardiogram [ECG] [EKG]: Secondary | ICD-10-CM

## 2022-12-06 DIAGNOSIS — R079 Chest pain, unspecified: Secondary | ICD-10-CM

## 2022-12-06 DIAGNOSIS — R0989 Other specified symptoms and signs involving the circulatory and respiratory systems: Secondary | ICD-10-CM

## 2022-12-06 MED ORDER — IOPAMIDOL (ISOVUE-370) INJECTION 76%
75.0000 mL | Freq: Once | INTRAVENOUS | Status: AC | PRN
  Administered 2022-12-06: 75 mL via INTRAVENOUS

## 2022-12-24 ENCOUNTER — Other Ambulatory Visit: Payer: BC Managed Care – PPO

## 2022-12-27 ENCOUNTER — Other Ambulatory Visit: Payer: Self-pay | Admitting: Internal Medicine

## 2023-02-20 ENCOUNTER — Other Ambulatory Visit: Payer: Self-pay | Admitting: Internal Medicine

## 2023-02-20 DIAGNOSIS — I10 Essential (primary) hypertension: Secondary | ICD-10-CM

## 2023-03-30 ENCOUNTER — Other Ambulatory Visit: Payer: Self-pay | Admitting: Internal Medicine

## 2023-05-22 ENCOUNTER — Other Ambulatory Visit: Payer: Self-pay | Admitting: Internal Medicine

## 2023-05-22 DIAGNOSIS — I10 Essential (primary) hypertension: Secondary | ICD-10-CM

## 2023-06-05 ENCOUNTER — Other Ambulatory Visit: Payer: Self-pay | Admitting: Internal Medicine

## 2023-06-05 DIAGNOSIS — I10 Essential (primary) hypertension: Secondary | ICD-10-CM

## 2023-06-06 ENCOUNTER — Ambulatory Visit: Payer: BC Managed Care – PPO | Admitting: Family Medicine

## 2023-06-06 ENCOUNTER — Encounter: Payer: Self-pay | Admitting: Family Medicine

## 2023-06-06 VITALS — BP 122/80 | HR 78 | Temp 97.8°F | Ht 69.0 in | Wt 246.0 lb

## 2023-06-06 DIAGNOSIS — R209 Unspecified disturbances of skin sensation: Secondary | ICD-10-CM | POA: Diagnosis not present

## 2023-06-06 DIAGNOSIS — M62838 Other muscle spasm: Secondary | ICD-10-CM | POA: Diagnosis not present

## 2023-06-06 NOTE — Progress Notes (Signed)
Assessment & Plan:  1. Disturbance of skin sensation - CBC with Differential/Platelet - Comprehensive metabolic panel - Magnesium - TSH - Vitamin B12  2. Muscle spasm Encouraged to get up and move around and not stay in the same position for prolonged period of time. - CBC with Differential/Platelet - Comprehensive metabolic panel - Magnesium - TSH - Vitamin B12   Follow up plan: Return if symptoms worsen or fail to improve.  Deliah Boston, MSN, APRN, FNP-C  Subjective:  HPI: Taylor Schroeder is a 57 y.o. male presenting on 06/06/2023 for Back Pain (From base of neck all the way down the back, also has occasional spasms - shirt hurts to touch at times. /X 2 mos  - no visible rash )  Patient reports his skin feels like a sunburn from the base of his neck all the way down his back.  This has been going on for two months.  Denies any new products, laundry detergent, lotions, etc.  No rash.  Patient previously took a vitamin B12 supplement but stopped it in March 2024 per his PCP when his level came back high.  He has tried applying capsaicin cream which was not helpful.  He also reports muscle spasms in his back that happen after he has been sitting or standing in the same position for prolonged period of time.    ROS: Negative unless specifically indicated above in HPI.   Relevant past medical history reviewed and updated as indicated.   Allergies and medications reviewed and updated.   Current Outpatient Medications:    CVS D3 50 MCG (2000 UT) CAPS, TAKE 2 CAPSULES BY MOUTH EVERY DAY, Disp: 180 capsule, Rfl: 1   esomeprazole (NEXIUM) 40 MG capsule, TAKE ONE CAPSULE BY MOUTH EVERY MORNING BEFORE BREAKFAST, Disp: 90 capsule, Rfl: 0   Multiple Vitamin (MULTIVITAMIN) LIQD, Take 5 mLs by mouth daily., Disp: , Rfl:    telmisartan (MICARDIS) 80 MG tablet, TAKE 1 TABLET BY MOUTH EVERY DAY, Disp: 30 tablet, Rfl: 0   tirzepatide (ZEPBOUND) 2.5 MG/0.5ML Pen, Inject 2.5 mg into the  skin once a week., Disp: 4 mL, Rfl: 0   fluocinonide cream (LIDEX) 0.05 %, Apply 1 application topically 2 (two) times daily. (Patient not taking: Reported on 06/06/2023), Disp: 120 g, Rfl: 1   levocetirizine (XYZAL) 5 MG tablet, Take 2 tablets (10 mg total) by mouth every evening. (Patient not taking: Reported on 06/06/2023), Disp: 180 tablet, Rfl: 0  Allergies  Allergen Reactions   Allegra [Fexofenadine Hcl] Hives   Crestor [Rosuvastatin] Other (See Comments)    Myalgias    Hctz [Hydrochlorothiazide] Other (See Comments)    Weak and muscle cramps    Demerol [Meperidine] Nausea And Vomiting    "Bad things" happen when I take it"    Objective:   BP 122/80   Pulse 78   Temp 97.8 F (36.6 C)   Ht 5\' 9"  (1.753 m)   Wt 246 lb (111.6 kg)   BMI 36.33 kg/m    Physical Exam Vitals reviewed.  Constitutional:      General: He is not in acute distress.    Appearance: Normal appearance. He is not ill-appearing, toxic-appearing or diaphoretic.  HENT:     Head: Normocephalic and atraumatic.  Eyes:     General: No scleral icterus.       Right eye: No discharge.        Left eye: No discharge.     Conjunctiva/sclera: Conjunctivae normal.  Cardiovascular:  Rate and Rhythm: Normal rate.  Pulmonary:     Effort: Pulmonary effort is normal. No respiratory distress.  Musculoskeletal:        General: Normal range of motion.     Cervical back: Normal and normal range of motion.     Thoracic back: Normal.     Lumbar back: Normal.  Skin:    General: Skin is warm and dry.     Findings: No abrasion, abscess, bruising, burn, ecchymosis, erythema, signs of injury, laceration, lesion, petechiae, rash or wound.  Neurological:     Mental Status: He is alert and oriented to person, place, and time. Mental status is at baseline.  Psychiatric:        Mood and Affect: Mood normal.        Behavior: Behavior normal.        Thought Content: Thought content normal.        Judgment: Judgment normal.

## 2023-06-07 LAB — CBC WITH DIFFERENTIAL/PLATELET
Basophils Absolute: 0.1 10*3/uL (ref 0.0–0.1)
Basophils Relative: 1.2 % (ref 0.0–3.0)
Eosinophils Absolute: 0.2 10*3/uL (ref 0.0–0.7)
Eosinophils Relative: 1.8 % (ref 0.0–5.0)
HCT: 42.8 % (ref 39.0–52.0)
Hemoglobin: 14.1 g/dL (ref 13.0–17.0)
Lymphocytes Relative: 37 % (ref 12.0–46.0)
Lymphs Abs: 3.2 10*3/uL (ref 0.7–4.0)
MCHC: 32.9 g/dL (ref 30.0–36.0)
MCV: 92.4 fl (ref 78.0–100.0)
Monocytes Absolute: 0.7 10*3/uL (ref 0.1–1.0)
Monocytes Relative: 8.3 % (ref 3.0–12.0)
Neutro Abs: 4.4 10*3/uL (ref 1.4–7.7)
Neutrophils Relative %: 51.7 % (ref 43.0–77.0)
Platelets: 272 10*3/uL (ref 150.0–400.0)
RBC: 4.63 Mil/uL (ref 4.22–5.81)
RDW: 13.7 % (ref 11.5–15.5)
WBC: 8.6 10*3/uL (ref 4.0–10.5)

## 2023-06-07 LAB — COMPREHENSIVE METABOLIC PANEL
ALT: 24 U/L (ref 0–53)
AST: 18 U/L (ref 0–37)
Albumin: 4.1 g/dL (ref 3.5–5.2)
Alkaline Phosphatase: 55 U/L (ref 39–117)
BUN: 22 mg/dL (ref 6–23)
CO2: 29 meq/L (ref 19–32)
Calcium: 9.4 mg/dL (ref 8.4–10.5)
Chloride: 105 meq/L (ref 96–112)
Creatinine, Ser: 1.07 mg/dL (ref 0.40–1.50)
GFR: 76.71 mL/min (ref >60.00)
Glucose, Bld: 76 mg/dL (ref 70–99)
Potassium: 4.1 meq/L (ref 3.5–5.1)
Sodium: 140 meq/L (ref 135–145)
Total Bilirubin: 0.4 mg/dL (ref 0.2–1.2)
Total Protein: 7 g/dL (ref 6.0–8.3)

## 2023-06-07 LAB — MAGNESIUM: Magnesium: 2 mg/dL (ref 1.5–2.5)

## 2023-06-07 LAB — VITAMIN B12: Vitamin B-12: 783 pg/mL (ref 211–911)

## 2023-06-07 LAB — TSH: TSH: 1.38 u[IU]/mL (ref 0.35–5.50)

## 2023-06-13 NOTE — Progress Notes (Signed)
Referral placed to neurology

## 2023-06-13 NOTE — Addendum Note (Signed)
Addended by: Gwenlyn Fudge on: 06/13/2023 03:19 PM   Modules accepted: Orders

## 2023-06-14 ENCOUNTER — Encounter: Payer: Self-pay | Admitting: Neurology

## 2023-06-21 ENCOUNTER — Ambulatory Visit: Payer: BC Managed Care – PPO | Admitting: Neurology

## 2023-06-21 ENCOUNTER — Encounter: Payer: Self-pay | Admitting: Neurology

## 2023-06-27 ENCOUNTER — Other Ambulatory Visit: Payer: Self-pay | Admitting: Internal Medicine

## 2023-07-12 ENCOUNTER — Ambulatory Visit: Payer: BC Managed Care – PPO | Admitting: Internal Medicine

## 2023-07-12 ENCOUNTER — Encounter: Payer: Self-pay | Admitting: Internal Medicine

## 2023-07-12 VITALS — BP 132/84 | HR 88 | Temp 97.7°F | Resp 16 | Ht 69.0 in | Wt 241.0 lb

## 2023-07-12 DIAGNOSIS — R3911 Hesitancy of micturition: Secondary | ICD-10-CM | POA: Diagnosis not present

## 2023-07-12 DIAGNOSIS — N401 Enlarged prostate with lower urinary tract symptoms: Secondary | ICD-10-CM | POA: Diagnosis not present

## 2023-07-12 DIAGNOSIS — Z1211 Encounter for screening for malignant neoplasm of colon: Secondary | ICD-10-CM

## 2023-07-12 DIAGNOSIS — R9431 Abnormal electrocardiogram [ECG] [EKG]: Secondary | ICD-10-CM

## 2023-07-12 DIAGNOSIS — E785 Hyperlipidemia, unspecified: Secondary | ICD-10-CM

## 2023-07-12 DIAGNOSIS — I10 Essential (primary) hypertension: Secondary | ICD-10-CM

## 2023-07-12 DIAGNOSIS — Z23 Encounter for immunization: Secondary | ICD-10-CM | POA: Insufficient documentation

## 2023-07-12 DIAGNOSIS — L918 Other hypertrophic disorders of the skin: Secondary | ICD-10-CM | POA: Diagnosis not present

## 2023-07-12 LAB — URINALYSIS, ROUTINE W REFLEX MICROSCOPIC
Bilirubin Urine: NEGATIVE
Hgb urine dipstick: NEGATIVE
Ketones, ur: NEGATIVE
Leukocytes,Ua: NEGATIVE
Nitrite: NEGATIVE
RBC / HPF: NONE SEEN (ref 0–?)
Specific Gravity, Urine: 1.025 (ref 1.000–1.030)
Total Protein, Urine: NEGATIVE
Urine Glucose: NEGATIVE
Urobilinogen, UA: 0.2 (ref 0.0–1.0)
pH: 6 (ref 5.0–8.0)

## 2023-07-12 LAB — PSA: PSA: 3.81 ng/mL (ref 0.10–4.00)

## 2023-07-12 MED ORDER — ALFUZOSIN HCL ER 10 MG PO TB24: 10.0000 mg | ORAL_TABLET | Freq: Every day | ORAL | 0 refills | Status: DC

## 2023-07-12 MED ORDER — DUTASTERIDE 0.5 MG PO CAPS: 0.5000 mg | ORAL_CAPSULE | Freq: Every day | ORAL | 0 refills | Status: DC

## 2023-07-12 NOTE — Patient Instructions (Signed)
Hypertension, Adult High blood pressure (hypertension) is when the force of blood pumping through the arteries is too strong. The arteries are the blood vessels that carry blood from the heart throughout the body. Hypertension forces the heart to work harder to pump blood and may cause arteries to become narrow or stiff. Untreated or uncontrolled hypertension can lead to a heart attack, heart failure, a stroke, kidney disease, and other problems. A blood pressure reading consists of a higher number over a lower number. Ideally, your blood pressure should be below 120/80. The first ("top") number is called the systolic pressure. It is a measure of the pressure in your arteries as your heart beats. The second ("bottom") number is called the diastolic pressure. It is a measure of the pressure in your arteries as the heart relaxes. What are the causes? The exact cause of this condition is not known. There are some conditions that result in high blood pressure. What increases the risk? Certain factors may make you more likely to develop high blood pressure. Some of these risk factors are under your control, including: Smoking. Not getting enough exercise or physical activity. Being overweight. Having too much fat, sugar, calories, or salt (sodium) in your diet. Drinking too much alcohol. Other risk factors include: Having a personal history of heart disease, diabetes, high cholesterol, or kidney disease. Stress. Having a family history of high blood pressure and high cholesterol. Having obstructive sleep apnea. Age. The risk increases with age. What are the signs or symptoms? High blood pressure may not cause symptoms. Very high blood pressure (hypertensive crisis) may cause: Headache. Fast or irregular heartbeats (palpitations). Shortness of breath. Nosebleed. Nausea and vomiting. Vision changes. Severe chest pain, dizziness, and seizures. How is this diagnosed? This condition is diagnosed by  measuring your blood pressure while you are seated, with your arm resting on a flat surface, your legs uncrossed, and your feet flat on the floor. The cuff of the blood pressure monitor will be placed directly against the skin of your upper arm at the level of your heart. Blood pressure should be measured at least twice using the same arm. Certain conditions can cause a difference in blood pressure between your right and left arms. If you have a high blood pressure reading during one visit or you have normal blood pressure with other risk factors, you may be asked to: Return on a different day to have your blood pressure checked again. Monitor your blood pressure at home for 1 week or longer. If you are diagnosed with hypertension, you may have other blood or imaging tests to help your health care provider understand your overall risk for other conditions. How is this treated? This condition is treated by making healthy lifestyle changes, such as eating healthy foods, exercising more, and reducing your alcohol intake. You may be referred for counseling on a healthy diet and physical activity. Your health care provider may prescribe medicine if lifestyle changes are not enough to get your blood pressure under control and if: Your systolic blood pressure is above 130. Your diastolic blood pressure is above 80. Your personal target blood pressure may vary depending on your medical conditions, your age, and other factors. Follow these instructions at home: Eating and drinking  Eat a diet that is high in fiber and potassium, and low in sodium, added sugar, and fat. An example of this eating plan is called the DASH diet. DASH stands for Dietary Approaches to Stop Hypertension. To eat this way: Eat   plenty of fresh fruits and vegetables. Try to fill one half of your plate at each meal with fruits and vegetables. Eat whole grains, such as whole-wheat pasta, brown rice, or whole-grain bread. Fill about one  fourth of your plate with whole grains. Eat or drink low-fat dairy products, such as skim milk or low-fat yogurt. Avoid fatty cuts of meat, processed or cured meats, and poultry with skin. Fill about one fourth of your plate with lean proteins, such as fish, chicken without skin, beans, eggs, or tofu. Avoid pre-made and processed foods. These tend to be higher in sodium, added sugar, and fat. Reduce your daily sodium intake. Many people with hypertension should eat less than 1,500 mg of sodium a day. Do not drink alcohol if: Your health care provider tells you not to drink. You are pregnant, may be pregnant, or are planning to become pregnant. If you drink alcohol: Limit how much you have to: 0-1 drink a day for women. 0-2 drinks a day for men. Know how much alcohol is in your drink. In the [place] one drink equals one 12 oz bottle of beer (355 mL), one 5 oz glass of wine (148 mL), or one 1 oz glass of hard liquor (44 mL). Lifestyle  Work with your health care provider to maintain a healthy body weight or to lose weight. Ask what an ideal weight is for you. Get at least 30 minutes of exercise that causes your heart to beat faster (aerobic exercise) most days of the week. Activities may include walking, swimming, or biking. Include exercise to strengthen your muscles (resistance exercise), such as Pilates or lifting weights, as part of your weekly exercise routine. Try to do these types of exercises for 30 minutes at least 3 days a week. Do not use any products that contain nicotine or tobacco. These products include cigarettes, chewing tobacco, and vaping devices, such as e-cigarettes. If you need help quitting, ask your health care provider. Monitor your blood pressure at home as told by your health care provider. Keep all follow-up visits. This is important. Medicines Take over-the-counter and prescription medicines only as told by your health care provider. Follow directions carefully. Blood  pressure medicines must be taken as prescribed. Do not skip doses of blood pressure medicine. Doing this puts you at risk for problems and can make the medicine less effective. Ask your health care provider about side effects or reactions to medicines that you should watch for. Contact a health care provider if you: Think you are having a reaction to a medicine you are taking. Have headaches that keep coming back (recurring). Feel dizzy. Have swelling in your ankles. Have trouble with your vision. Get help right away if you: Develop a severe headache or confusion. Have unusual weakness or numbness. Feel faint. Have severe pain in your chest or abdomen. Vomit repeatedly. Have trouble breathing. These symptoms may be an emergency. Get help right away. Call 911. Do not wait to see if the symptoms will go away. Do not drive yourself to the hospital. Summary Hypertension is when the force of blood pumping through your arteries is too strong. If this condition is not controlled, it may put you at risk for serious complications. Your personal target blood pressure may vary depending on your medical conditions, your age, and other factors. For most people, a normal blood pressure is less than 120/80. Hypertension is treated with lifestyle changes, medicines, or a combination of both. Lifestyle changes include losing weight, eating a healthy,   low-sodium diet, exercising more, and limiting alcohol. This information is not intended to replace advice given to you by your health care provider. Make sure you discuss any questions you have with your health care provider. Document Revised: 07/21/2021 Document Reviewed: 07/21/2021 Elsevier Patient Education  2024 [employer]  

## 2023-07-12 NOTE — Progress Notes (Unsigned)
Subjective:  Patient ID: Taylor Schroeder, male    DOB: 03-10-1966  Age: 57 y.o. MRN: [id number]  CC: Hypertension   HPI Taylor Schroeder presents for f/up ----  Discussed the use of AI scribe software for clinical note transcription with the patient, who gave verbal consent to proceed.  History of Present Illness   The patient, with a history of hypertension and stress-related panic attacks, reports a significant improvement in his health status. He attributes this to the resolution of a stressful period during which he was obtaining his [employer] license. He denies any recent episodes of chest pain, shortness of breath, or dizziness. He has been managing his blood pressure with half the prescribed dose of his 80mg  medication, which he reports has been effective in maintaining control.  The patient also has a history of requiring semaglutide injections for an unspecified condition, which he was able to obtain at a reduced cost through a homeopathic provider. However, he recently switched to Zepbound due to a plateau in his response to semaglutide.  He denies any recent swelling in his legs or feet, except when he has been standing for extended periods. He also reports the presence of two moles that have become increasingly bothersome due to discomfort.       Outpatient Medications Prior to Visit  Medication Sig Dispense Refill   [facility] D3 50 MCG (2000 UT) CAPS TAKE 2 CAPSULES BY MOUTH EVERY DAY 180 capsule 1   esomeprazole (NEXIUM) 40 MG capsule TAKE ONE CAPSULE BY MOUTH EVERY MORNING BEFORE BREAKFAST 90 capsule 0   fluocinonide cream (LIDEX) 0.05 % Apply 1 application topically 2 (two) times daily. 120 g 1   levocetirizine (XYZAL) 5 MG tablet Take 2 tablets (10 mg total) by mouth every evening. 180 tablet 0   Multiple Vitamin (MULTIVITAMIN) LIQD Take 5 mLs by mouth daily.     telmisartan (MICARDIS) 80 MG tablet TAKE 1 TABLET BY MOUTH EVERY DAY 30 tablet 0   tirzepatide (ZEPBOUND) 2.5 MG/0.5ML  Pen Inject 2.5 mg into the skin once a week. 4 mL 0   No facility-administered medications prior to visit.    ROS Review of Systems  Constitutional:  Negative for appetite change, chills, diaphoresis, fatigue and fever.  HENT: Negative.    Eyes: Negative.   Respiratory: Negative.  Negative for cough, chest tightness, shortness of breath and wheezing.   Cardiovascular:  Negative for chest pain, palpitations and leg swelling.  Gastrointestinal:  Positive for constipation and nausea. Negative for abdominal pain, diarrhea and vomiting.  Endocrine: Negative.   Genitourinary:  Positive for difficulty urinating. Negative for dysuria, hematuria, scrotal swelling, testicular pain and urgency.  Musculoskeletal:  Negative for arthralgias.  Skin: Negative.   Neurological: Negative.  Negative for dizziness, weakness, light-headedness and headaches.  Hematological:  Negative for adenopathy. Does not bruise/bleed easily.  Psychiatric/Behavioral: Negative.      Objective:  BP 132/84 (BP Location: Left Arm, Patient Position: Sitting, Cuff Size: Large)   Pulse 88   Temp 97.7 F (36.5 C) (Oral)   Resp 16   Ht 5\' 9"  (1.753 m)   Wt 241 lb (109.3 kg)   SpO2 98%   BMI 35.59 kg/m   BP Readings from Last 3 Encounters:  07/12/23 132/84  06/06/23 122/80  11/29/22 (!) 148/98    Wt Readings from Last 3 Encounters:  07/12/23 241 lb (109.3 kg)  06/06/23 246 lb (111.6 kg)  11/29/22 279 lb (126.6 kg)    Physical Exam Vitals  reviewed.  Constitutional:      Appearance: Normal appearance.  HENT:     Mouth/Throat:     Mouth: Mucous membranes are moist.  Eyes:     General: No scleral icterus.    Conjunctiva/sclera: Conjunctivae normal.  Cardiovascular:     Rate and Rhythm: Normal rate and regular rhythm.     Heart sounds: Normal heart sounds, S1 normal and S2 normal. No murmur heard.    No friction rub. No gallop.     Comments: EKG- NSR, 84 bpm +++artifact Septal infarct pattern is not  new No LVH or acute ST/T wave changes  Pulmonary:     Effort: Pulmonary effort is normal.     Breath sounds: No stridor. No wheezing, rhonchi or rales.  Abdominal:     General: Abdomen is flat.     Palpations: There is no mass.     Tenderness: There is no abdominal tenderness. There is no guarding.     Hernia: No hernia is present.  Musculoskeletal:        General: Normal range of motion.     Cervical back: Neck supple.     Right lower leg: No edema.     Left lower leg: No edema.  Lymphadenopathy:     Cervical: No cervical adenopathy.  Skin:    General: Skin is warm.     Findings: Lesion present. No erythema or rash.  Neurological:     General: No focal deficit present.     Mental Status: He is alert and oriented to person, place, and time.  Psychiatric:        Mood and Affect: Mood normal.        Behavior: Behavior normal.        Thought Content: Thought content normal.        Judgment: Judgment normal.     Lab Results  Component Value Date   WBC 8.6 06/06/2023   HGB 14.1 06/06/2023   HCT 42.8 06/06/2023   PLT 272.0 06/06/2023   GLUCOSE 76 06/06/2023   CHOL 189 11/29/2022   TRIG 106.0 11/29/2022   HDL 53.90 11/29/2022   LDLDIRECT 134.0 05/03/2019   LDLCALC 114 (H) 11/29/2022   ALT 24 06/06/2023   AST 18 06/06/2023   NA 140 06/06/2023   K 4.1 06/06/2023   CL 105 06/06/2023   CREATININE 1.07 06/06/2023   BUN 22 06/06/2023   CO2 29 06/06/2023   TSH 1.38 06/06/2023   PSA 3.81 07/12/2023   INR 1.0 11/29/2022   HGBA1C 5.7 11/29/2022    CT ANGIO CHEST AORTA W/CM & OR WO/CM  Result Date: 12/06/2022 CLINICAL DATA:  Chest pain, asymmetric upper extremity blood pressure EXAM: CT ANGIOGRAPHY CHEST WITH CONTRAST TECHNIQUE: Multidetector CT imaging of the chest was performed using the standard protocol during bolus administration of intravenous contrast. Multiplanar CT image reconstructions and MIPs were obtained to evaluate the vascular anatomy. RADIATION DOSE  REDUCTION: This exam was performed according to the departmental dose-optimization program which includes automated exposure control, adjustment of the mA and/or kV according to patient size and/or use of iterative reconstruction technique. CONTRAST:  75mL ISOVUE-370 IOPAMIDOL (ISOVUE-370) INJECTION 76% COMPARISON:  CT 01/27/2013 FINDINGS: Cardiovascular: SVC patent. Heart size normal. No pericardial effusion. The RV is nondilated. Satisfactory opacification of pulmonary arteries noted, and there is no evidence of pulmonary emboli. Scattered coronary calcifications. Adequate contrast opacification of the thoracic aorta with no evidence of dissection, aneurysm, or stenosis. There is bovine variant brachiocephalic arch anatomy without  proximal stenosis. Mediastinum/Nodes: No mass or adenopathy. Lungs/Pleura: No pleural effusion. No pneumothorax. Small bilateral upper lobe blebs. Lungs are otherwise clear. Upper Abdomen: Cholecystectomy clips.  No acute findings. Musculoskeletal: Left shoulder arthroplasty hardware partially visualized. No acute findings. Review of the MIP images confirms the above findings. IMPRESSION: 1. Negative for acute PE or thoracic aortic dissection. 2. Coronary and Aortic Atherosclerosis (ICD10-170.0). Electronically Signed   By: Corlis Leak M.D.   On: 12/06/2022 16:33    Assessment & Plan:  Flu vaccine need -     Flu vaccine trivalent PF, 6mos and older(Flulaval,Afluria,Fluarix,Fluzone)  ST (skin tag) -     Ambulatory referral to Plastic Surgery  Screening for colon cancer -     Ambulatory referral to Gastroenterology  Abnormal electrocardiogram (ECG) (EKG) - MPI was not completed. Will evaluate with an ECHO. -     EKG 12-Lead -     ECHOCARDIOGRAM COMPLETE; Future  Essential hypertension- BP is adequately well controlled. -     EKG 12-Lead -     Urinalysis, Routine w reflex microscopic; Future  Benign prostatic hyperplasia with urinary hesitancy- Will treat with a  peripheral alpha blocker and a 5 alpha-reductase inh. -     PSA; Future -     Urinalysis, Routine w reflex microscopic; Future -     Alfuzosin HCl ER; Take 1 tablet (10 mg total) by mouth daily with breakfast.  Dispense: 90 tablet; Refill: 0 -     Dutasteride; Take 1 capsule (0.5 mg total) by mouth daily.  Dispense: 90 capsule; Refill: 0  Hyperlipidemia with target LDL less than 100- He is not willing to take a statin.     Follow-up: Return in about 6 months (around 01/10/2024).  Sanda Linger, MD

## 2023-07-13 DIAGNOSIS — Z23 Encounter for immunization: Secondary | ICD-10-CM | POA: Diagnosis not present

## 2023-07-18 ENCOUNTER — Ambulatory Visit: Payer: BC Managed Care – PPO | Admitting: Neurology

## 2023-07-18 ENCOUNTER — Encounter: Payer: Self-pay | Admitting: Neurology

## 2023-07-18 VITALS — BP 160/102 | HR 83 | Ht 69.0 in | Wt 246.0 lb

## 2023-07-18 DIAGNOSIS — R202 Paresthesia of skin: Secondary | ICD-10-CM | POA: Diagnosis not present

## 2023-07-18 DIAGNOSIS — R292 Abnormal reflex: Secondary | ICD-10-CM | POA: Diagnosis not present

## 2023-07-18 MED ORDER — DULOXETINE HCL 30 MG PO CPEP: 30.0000 mg | ORAL_CAPSULE | Freq: Every day | ORAL | 3 refills | Status: DC

## 2023-07-18 NOTE — Progress Notes (Signed)
[facility] Clinic Note - Initial Visit   Date: 07/18/2023   Taylor Schroeder MRN: [id number] DOB: Apr 13, 1966   Dear Dr. Yetta Barre:  Thank you for your kind referral of Taylor Schroeder for consultation of disturbance of skin sensation. Although his history is well known to you, please allow [name] to reiterate it for the purpose of our medical record. The patient was accompanied to the clinic by self.  Taylor Schroeder is a 57 y.o. right-handed male with GERD and hyperlipidemia,  presenting for evaluation of disturbance of skin sensation.   IMPRESSION/PLAN: Dysesthesias involving the thoracic region with exam showing hyperreflexia and sensory level at T10.  However, he reports symptoms starting from the lower portion of the cervical region and with his left hand parethesias, I will obtain MRI cervical and thoracic spine wo contrast to look for cord pathology.   Further recommendations pending results.   ------------------------------------------------------------- History of present illness: Starting in August 2024, he began having cramp in the mid-back which evolves into burning sensation involving entire body.  It lasts 1-2 days and self-resolves.  He has noticed that sitting on a hard surface or sitting hunched over can aggravate symptoms.  He has left hand numbness in first four fingers, which is intermittent. It is aggravated by holding a phone or sitting in certain position.  He denies weakness in the arms or leg.  He tends to have chronic neck pain.   He works in [employer].  Nonsmoker.  Occasional alcohol use.  He lives at home with wife.  He has two grown children.   Out-side paper records, electronic medical record, and images have been reviewed where available and summarized as:  MRI cervical spine wo contrast 12/13/2014: 1. Multilevel degenerative disc disease with moderate foraminal narrowing at C3-4 and C5-6 and slight bilateral foraminal narrowing at C4-5. 2.  No disc protrusions or discrete neural impingement. No significant change since the prior neck CT dated 02/27/2014.  MRI brain wwo contrast, MRA head 06/12/2020: IMPRESSION: 1. Normal MRI of the brain. 2. Normal intracranial MRA. 3. Right mastoid effusion.   Lab Results  Component Value Date   HGBA1C 5.7 11/29/2022   Lab Results  Component Value Date   VITAMINB12 783 06/06/2023   Lab Results  Component Value Date   TSH 1.38 06/06/2023   Lab Results  Component Value Date   ESRSEDRATE 11 03/10/2020    Past Medical History:  Diagnosis Date   Arthritis    "qwhere" (02/03/2017)   BPH (benign prostatic hyperplasia)    Childhood asthma    "as a baby"   Coronary artery disease Non-obstructive   a. nonobs cath in 2006;  b. 01/2013 Cath: LM nl, LAD 55m, LCX nl, RCA non-dom, nl. EF 60-65%.   COVID    Esophageal ring, acquired 07/12/2013   GERD (gastroesophageal reflux disease)    H/O hiatal hernia    "think so" (02/06/2014)   Headache    "resolved w/BP control in 11/2015" (02/03/2017)   High cholesterol    History of kidney stones    Hypertension    Ileitis 2014   Suspected Crohn's, think not as time has gone on   NAFLD (nonalcoholic fatty liver disease) 9/56/3875   Obesity    Pernicious anemia-B12 deficiency 04/18/2013   PONV (postoperative nausea and vomiting) 02/03/2017; 08/2016   Sleep apnea    cpap every night    Past Surgical History:  Procedure Laterality Date   APPENDECTOMY     CARDIAC  CATHETERIZATION  01/2013   "results were clear"   CARPAL TUNNEL RELEASE Right    CHOLECYSTECTOMY N/A 02/03/2017   Procedure: LAPAROSCOPIC CHOLECYSTECTOMY;  Surgeon: Gaynelle Adu, MD;  Location: [facility] OR;  Service: General;  Laterality: N/A;   COLONOSCOPY  ~ 2014   COLONOSCOPY     ESOPHAGOGASTRODUODENOSCOPY  ~ 2014   INGUINAL HERNIA REPAIR  1966   "? side"   KNEE ARTHROPLASTY Left 02/06/2014   Procedure: COMPUTER ASSISTED TOTAL KNEE ARTHROPLASTY;  Surgeon: Eldred Manges, MD;   Location: [facility] OR;  Service: Orthopedics;  Laterality: Left;  Cemented Left Total Knee Arthroplasty   KNEE ARTHROSCOPY Left 08/2016   scar tissue cleaned out   LAPAROSCOPIC CHOLECYSTECTOMY  02/03/2017   LEFT HEART CATHETERIZATION WITH CORONARY ANGIOGRAM N/A 01/29/2013   Procedure: LEFT HEART CATHETERIZATION WITH CORONARY ANGIOGRAM;  Surgeon: [name], MD;  Location: [facility] CATH LAB;  Service: Cardiovascular;  Laterality: N/A;   MULTIPLE TOOTH EXTRACTIONS  09/2016   REVERSE SHOULDER ARTHROPLASTY Left 07/31/2019   Procedure: LEFT REVERSE SHOULDER ARTHROPLASTY;  Surgeon: Cammy Copa, MD;  Location: [facility] OR;  Service: Orthopedics;  Laterality: Left;   SHOULDER ARTHROSCOPY Left 03/12/2013   TOTAL KNEE ARTHROPLASTY Left 02/06/2014   TOTAL KNEE ARTHROPLASTY Right 09/14/2017   TOTAL KNEE ARTHROPLASTY Right 09/14/2017   Procedure: RIGHT TOTAL KNEE ARTHROPLASTY;  Surgeon: Eldred Manges, MD;  Location: [facility] OR;  Service: Orthopedics;  Laterality: Right;     Medications:  Outpatient Encounter Medications as of 07/18/2023  Medication Sig   alfuzosin (UROXATRAL) 10 MG 24 hr tablet Take 1 tablet (10 mg total) by mouth daily with breakfast.   [employer] D3 50 MCG (2000 UT) CAPS TAKE 2 CAPSULES BY MOUTH EVERY DAY   dutasteride (AVODART) 0.5 MG capsule Take 1 capsule (0.5 mg total) by mouth daily.   esomeprazole (NEXIUM) 40 MG capsule TAKE ONE CAPSULE BY MOUTH EVERY MORNING BEFORE BREAKFAST   fluocinonide cream (LIDEX) 0.05 % Apply 1 application topically 2 (two) times daily.   levocetirizine (XYZAL) 5 MG tablet Take 2 tablets (10 mg total) by mouth every evening.   Multiple Vitamin (MULTIVITAMIN) LIQD Take 5 mLs by mouth daily.   telmisartan (MICARDIS) 80 MG tablet TAKE 1 TABLET BY MOUTH EVERY DAY   tirzepatide (ZEPBOUND) 2.5 MG/0.5ML Pen Inject 2.5 mg into the skin once a week.   No facility-administered encounter medications on file as of 07/18/2023.    Allergies:  Allergies  Allergen Reactions    Allegra [Fexofenadine Hcl] Hives   Crestor [Rosuvastatin] Other (See Comments)    Myalgias    Hctz [Hydrochlorothiazide] Other (See Comments)    Weak and muscle cramps    Demerol [Meperidine] Nausea And Vomiting    "Bad things" happen when I take it"    Family History: Family History  Problem Relation Age of Onset   Hypertension Mother    Cervical cancer Mother    Cancer Father        type unknown   Emphysema Maternal Grandmother    Emphysema Maternal Grandfather    Other Paternal Grandmother        spinal cancer   COPD Other    Alcohol abuse Neg Hx    Diabetes Neg Hx    Early death Neg Hx    Heart disease Neg Hx    Hyperlipidemia Neg Hx    Kidney disease Neg Hx    Stroke Neg Hx    Colon cancer Neg Hx    Stomach cancer Neg  Hx    Esophageal cancer Neg Hx    Rectal cancer Neg Hx    Liver cancer Neg Hx     Social History: Social History   Tobacco Use   Smoking status: Former    Current packs/day: 0.00    Types: Cigarettes    Quit date: 08/29/1978    Years since quitting: 44.9   Smokeless tobacco: Never  Vaping Use   Vaping status: Never Used  Substance Use Topics   Alcohol use: Yes    Comment: Occasional Drink- 1-2 times per month   Drug use: No   Social History   Social History Narrative   Married, 2 daughters   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  Speech is not dysarthric.  CRANIAL NERVES: II:  No visual field defects.     III-IV-VI: Pupils equal round and  reactive to light.  Normal conjugate, extra-ocular eye movements in all directions of gaze.  No nystagmus.  No ptosis.   V:  Normal facial sensation.    VII:  Normal facial symmetry and movements.   VIII:  Normal hearing and vestibular function.   IX-X:  Normal palatal movement.   XI:  Normal shoulder shrug and head rotation.   XII:  Normal tongue strength and range of motion, no deviation or fasciculation.  MOTOR:  No atrophy, fasciculations or abnormal movements.  No pronator drift.   Upper Extremity:  Right  Left  Deltoid  5/5   5/5   Biceps  5/5   5/5   Triceps  5/5   5/5   Wrist extensors  5/5   5/5   Wrist flexors  5/5   5/5   Finger extensors  5/5   5/5   Finger flexors  5/5   5/5   Dorsal interossei  4/5   4/5   Abductor pollicis  5/5   5/5   Tone (Ashworth scale)  0  0   Lower Extremity:  Right  Left  Hip flexors  5/5   5/5   Knee flexors  5/5   5/5   Knee extensors  5/5   5/5   Dorsiflexors  5/5   5/5   Plantarflexors  5/5   5/5   Toe extensors  5/5   5/5   Toe flexors  5/5   5/5   Tone (Ashworth scale)  0  0   MSRs:                                           Right        Left brachioradialis 2+  2+  biceps 2+  2+  triceps 2+  2+  patellar 2+  2+  ankle jerk 2+  2+  Hoffman no  no  plantar response down  down   SENSORY:  Normal and symmetric perception of light touch, pinprick, vibration, and temperature in the arms and legs.  There is a sensory level at T10 to temperature and light tough, which causes hyperesthesia and allodynia.   COORDINATION/GAIT: Normal finger-to- nose-finger.  Intact rapid alternating movements bilaterally.  Able to rise from a chair without using arms.  Gait narrow based and stable. Tandem and stressed gait intact.     Thank you for allowing me to participate in patient's care.  If I can answer any additional questions, I would be pleased to do so.    Sincerely,    Elouise Divelbiss K. Allena Katz, DO

## 2023-07-18 NOTE — Patient Instructions (Addendum)
MRI cervical and thoracic spine  Start duloxetine (Cymbalta) 30mg  daily

## 2023-07-27 ENCOUNTER — Encounter: Payer: Self-pay | Admitting: Neurology

## 2023-08-03 ENCOUNTER — Ambulatory Visit: Payer: BC Managed Care – PPO | Admitting: Plastic Surgery

## 2023-08-03 ENCOUNTER — Encounter: Payer: Self-pay | Admitting: Plastic Surgery

## 2023-08-03 VITALS — BP 151/110 | HR 81 | Ht 69.0 in | Wt 239.6 lb

## 2023-08-03 DIAGNOSIS — L989 Disorder of the skin and subcutaneous tissue, unspecified: Secondary | ICD-10-CM | POA: Diagnosis not present

## 2023-08-03 NOTE — Progress Notes (Signed)
Referring Provider Taylor Grandchild, MD [address] [place],  [place] [address]   CC:  Chief Complaint  Patient presents with   Advice Only      Taylor Schroeder is an 57 y.o. male.  HPI: Mr. Taylor Schroeder is a 57 year old male who presents with several skin tags around the collar area.  Patient states that these have been there for several years and become irritated from brushing against his clothing.  He had 1 small 1 which actually became inflamed and fell off on its own.  He would like to have the skin tags removed  Allergies  Allergen Reactions   Allegra [Fexofenadine Hcl] Hives   Crestor [Rosuvastatin] Other (See Comments)    Myalgias    Hctz [Hydrochlorothiazide] Other (See Comments)    Weak and muscle cramps    Demerol [Meperidine] Nausea And Vomiting    "Bad things" happen when I take it"    Outpatient Encounter Medications as of 08/03/2023  Medication Sig   alfuzosin (UROXATRAL) 10 MG 24 hr tablet Take 1 tablet (10 mg total) by mouth daily with breakfast.   CVS D3 50 MCG (2000 UT) CAPS TAKE 2 CAPSULES BY MOUTH EVERY DAY   DULoxetine (CYMBALTA) 30 MG capsule Take 1 capsule (30 mg total) by mouth daily.   esomeprazole (NEXIUM) 40 MG capsule TAKE ONE CAPSULE BY MOUTH EVERY MORNING BEFORE BREAKFAST   fluocinonide cream (LIDEX) 0.05 % Apply 1 application topically 2 (two) times daily.   levocetirizine (XYZAL) 5 MG tablet Take 2 tablets (10 mg total) by mouth every evening.   Multiple Vitamin (MULTIVITAMIN) LIQD Take 5 mLs by mouth daily.   telmisartan (MICARDIS) 80 MG tablet TAKE 1 TABLET BY MOUTH EVERY DAY   [DISCONTINUED] dutasteride (AVODART) 0.5 MG capsule Take 1 capsule (0.5 mg total) by mouth daily. (Patient not taking: Reported on 08/03/2023)   [DISCONTINUED] tirzepatide (ZEPBOUND) 2.5 MG/0.5ML Pen Inject 2.5 mg into the skin once a week. (Patient not taking: Reported on 08/03/2023)   No facility-administered encounter medications on file as of 08/03/2023.     Past  Medical History:  Diagnosis Date   Arthritis    "qwhere" (02/03/2017)   BPH (benign prostatic hyperplasia)    Childhood asthma    "as a baby"   Coronary artery disease Non-obstructive   a. nonobs cath in 2006;  b. 01/2013 Cath: LM nl, LAD 74m, LCX nl, RCA non-dom, nl. EF 60-65%.   COVID    Esophageal ring, acquired 07/12/2013   GERD (gastroesophageal reflux disease)    H/O hiatal hernia    "think so" (02/06/2014)   Headache    "resolved w/BP control in 11/2015" (02/03/2017)   High cholesterol    History of kidney stones    Hypertension    Ileitis 2014   Suspected Crohn's, think not as time has gone on   NAFLD (nonalcoholic fatty liver disease) 8/65/7846   Obesity    Pernicious anemia-B12 deficiency 04/18/2013   PONV (postoperative nausea and vomiting) 02/03/2017; 08/2016   Sleep apnea    cpap every night    Past Surgical History:  Procedure Laterality Date   APPENDECTOMY     CARDIAC CATHETERIZATION  01/2013   "results were clear"   CARPAL TUNNEL RELEASE Right    CHOLECYSTECTOMY N/A 02/03/2017   Procedure: LAPAROSCOPIC CHOLECYSTECTOMY;  Surgeon: Gaynelle Adu, MD;  Location: [facility] OR;  Service: General;  Laterality: N/A;   COLONOSCOPY  ~ 2014   COLONOSCOPY     ESOPHAGOGASTRODUODENOSCOPY  ~  2014   INGUINAL HERNIA REPAIR  1966   "? side"   KNEE ARTHROPLASTY Left 02/06/2014   Procedure: COMPUTER ASSISTED TOTAL KNEE ARTHROPLASTY;  Surgeon: Eldred Manges, MD;  Location: [facility] OR;  Service: Orthopedics;  Laterality: Left;  Cemented Left Total Knee Arthroplasty   KNEE ARTHROSCOPY Left 08/2016   scar tissue cleaned out   LAPAROSCOPIC CHOLECYSTECTOMY  02/03/2017   LEFT HEART CATHETERIZATION WITH CORONARY ANGIOGRAM N/A 01/29/2013   Procedure: LEFT HEART CATHETERIZATION WITH CORONARY ANGIOGRAM;  Surgeon: [name], MD;  Location: [facility] CATH LAB;  Service: Cardiovascular;  Laterality: N/A;   MULTIPLE TOOTH EXTRACTIONS  09/2016   REVERSE SHOULDER ARTHROPLASTY Left 07/31/2019   Procedure: LEFT  REVERSE SHOULDER ARTHROPLASTY;  Surgeon: Cammy Copa, MD;  Location: [facility] OR;  Service: Orthopedics;  Laterality: Left;   SHOULDER ARTHROSCOPY Left 03/12/2013   TOTAL KNEE ARTHROPLASTY Left 02/06/2014   TOTAL KNEE ARTHROPLASTY Right 09/14/2017   TOTAL KNEE ARTHROPLASTY Right 09/14/2017   Procedure: RIGHT TOTAL KNEE ARTHROPLASTY;  Surgeon: Eldred Manges, MD;  Location: [facility] OR;  Service: Orthopedics;  Laterality: Right;    Family History  Problem Relation Age of Onset   Hypertension Mother    Cervical cancer Mother    Cancer Father        type unknown   Emphysema Maternal Grandmother    Emphysema Maternal Grandfather    Other Paternal Grandmother        spinal cancer   COPD Other    Alcohol abuse Neg Hx    Diabetes Neg Hx    Early death Neg Hx    Heart disease Neg Hx    Hyperlipidemia Neg Hx    Kidney disease Neg Hx    Stroke Neg Hx    Colon cancer Neg Hx    Stomach cancer Neg Hx    Esophageal cancer Neg Hx    Rectal cancer Neg Hx    Liver cancer Neg Hx     Social History   Social History Narrative   Married, 2 daughters   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  No evidence of infection today.  He states that they often become irritated.   Physical Exam    08/03/2023    9:10 AM 08/03/2023    8:58 AM 07/18/2023    9:43 AM  Vitals with BMI  Height  5\' 9"  5\' 9"   Weight  239 lbs 10 oz 246 lbs  BMI  35.37 36.31  Systolic 151 152 161  Diastolic 110 102 096  Pulse 81 88 83    General:  No acute distress,  Alert and oriented, Non-Toxic, Normal speech and affect Integument: Patient has 4-5 small skin tags 1 of  which is reasonably large but pedunculated.   Assessment/Plan Skin lesion: Patient has multiple skin tags which have been present for many years.  They have been slowly growing but appear to be benign.  Due to the fact that they often become caught on his close and irritated he would like to have them removed.  We discussed  removal in the office under local.  He understands that there will be shaved off which may result in a small amount of bleeding which will be controlled with pressure.  This technique however leaves minimal scars and results in complete removal of the skin tags.  He understands that they may recur and they may occur in other places.  All questions were answered to his satisfaction.  Photographs were obtained today with his consent.  Will schedule for removal in the office at his request.  Santiago Glad 08/03/2023, 10:12 AM

## 2023-08-08 ENCOUNTER — Encounter: Payer: Self-pay | Admitting: Internal Medicine

## 2023-08-09 ENCOUNTER — Other Ambulatory Visit: Payer: Self-pay | Admitting: Neurology

## 2023-08-10 ENCOUNTER — Ambulatory Visit
Admission: RE | Admit: 2023-08-10 | Discharge: 2023-08-10 | Disposition: A | Discharge status: Home / Self Care | Payer: BC Managed Care – PPO | Source: Ambulatory Visit | Attending: Neurology

## 2023-08-10 ENCOUNTER — Ambulatory Visit
Admission: RE | Admit: 2023-08-10 | Discharge: 2023-08-10 | Disposition: A | Discharge status: Home / Self Care | Payer: BC Managed Care – PPO | Source: Ambulatory Visit | Attending: Neurology | Admitting: Neurology

## 2023-08-10 DIAGNOSIS — R202 Paresthesia of skin: Secondary | ICD-10-CM

## 2023-08-10 DIAGNOSIS — R292 Abnormal reflex: Secondary | ICD-10-CM

## 2023-08-15 ENCOUNTER — Ambulatory Visit (HOSPITAL_COMMUNITY): Payer: BC Managed Care – PPO | Attending: Internal Medicine

## 2023-08-15 DIAGNOSIS — R9431 Abnormal electrocardiogram [ECG] [EKG]: Secondary | ICD-10-CM | POA: Diagnosis present

## 2023-08-15 LAB — ECHOCARDIOGRAM COMPLETE
Area-P 1/2: 2.87 cm2
S' Lateral: 3.2 cm

## 2023-08-23 ENCOUNTER — Encounter: Payer: Self-pay | Admitting: Internal Medicine

## 2023-08-28 ENCOUNTER — Other Ambulatory Visit: Payer: Self-pay | Admitting: Internal Medicine

## 2023-08-28 DIAGNOSIS — I10 Essential (primary) hypertension: Secondary | ICD-10-CM

## 2023-09-01 ENCOUNTER — Other Ambulatory Visit: Payer: Self-pay

## 2023-09-01 ENCOUNTER — Ambulatory Visit: Payer: BC Managed Care – PPO | Admitting: Plastic Surgery

## 2023-09-01 ENCOUNTER — Encounter: Payer: Self-pay | Admitting: Family Medicine

## 2023-09-01 ENCOUNTER — Emergency Department (HOSPITAL_COMMUNITY)
Admission: EM | Admit: 2023-09-01 | Discharge: 2023-09-01 | Disposition: A | Discharge status: Home / Self Care | Payer: BC Managed Care – PPO | Attending: Emergency Medicine | Admitting: Emergency Medicine

## 2023-09-01 ENCOUNTER — Encounter (HOSPITAL_COMMUNITY): Payer: Self-pay | Admitting: *Deleted

## 2023-09-01 ENCOUNTER — Ambulatory Visit: Payer: BC Managed Care – PPO | Admitting: Family Medicine

## 2023-09-01 ENCOUNTER — Emergency Department (HOSPITAL_COMMUNITY): Payer: BC Managed Care – PPO

## 2023-09-01 VITALS — BP 148/100 | HR 84 | Temp 97.6°F | Ht 69.0 in | Wt 237.0 lb

## 2023-09-01 DIAGNOSIS — R270 Ataxia, unspecified: Secondary | ICD-10-CM

## 2023-09-01 DIAGNOSIS — R110 Nausea: Secondary | ICD-10-CM

## 2023-09-01 DIAGNOSIS — R42 Dizziness and giddiness: Secondary | ICD-10-CM

## 2023-09-01 DIAGNOSIS — I10 Essential (primary) hypertension: Secondary | ICD-10-CM

## 2023-09-01 DIAGNOSIS — H532 Diplopia: Secondary | ICD-10-CM

## 2023-09-01 DIAGNOSIS — R519 Headache, unspecified: Secondary | ICD-10-CM

## 2023-09-01 DIAGNOSIS — R278 Other lack of coordination: Secondary | ICD-10-CM

## 2023-09-01 DIAGNOSIS — R030 Elevated blood-pressure reading, without diagnosis of hypertension: Secondary | ICD-10-CM

## 2023-09-01 LAB — CBC
HCT: 41.9 % (ref 39.0–52.0)
Hemoglobin: 14 g/dL (ref 13.0–17.0)
MCH: 29.9 pg (ref 26.0–34.0)
MCHC: 33.4 g/dL (ref 30.0–36.0)
MCV: 89.3 fL (ref 80.0–100.0)
Platelets: 253 10*3/uL (ref 150–400)
RBC: 4.69 MIL/uL (ref 4.22–5.81)
RDW: 13 % (ref 11.5–15.5)
WBC: 7.7 10*3/uL (ref 4.0–10.5)
nRBC: 0 % (ref 0.0–0.2)

## 2023-09-01 LAB — COMPREHENSIVE METABOLIC PANEL
ALT: 14 U/L (ref 0–44)
AST: 24 U/L (ref 15–41)
Albumin: 3.2 g/dL — ABNORMAL LOW (ref 3.5–5.0)
Alkaline Phosphatase: 44 U/L (ref 38–126)
Anion gap: 6 (ref 5–15)
BUN: 18 mg/dL (ref 6–20)
CO2: 22 mmol/L (ref 22–32)
Calcium: 8 mg/dL — ABNORMAL LOW (ref 8.9–10.3)
Chloride: 111 mmol/L (ref 98–111)
Creatinine, Ser: 0.8 mg/dL (ref 0.61–1.24)
GFR, Estimated: >60 mL/min (ref >60)
Glucose, Bld: 89 mg/dL (ref 70–99)
Potassium: 3.9 mmol/L (ref 3.5–5.1)
Sodium: 139 mmol/L (ref 135–145)
Total Bilirubin: 1 mg/dL (ref <1.2)
Total Protein: 5.6 g/dL — ABNORMAL LOW (ref 6.5–8.1)

## 2023-09-01 MED ORDER — MECLIZINE HCL 12.5 MG PO TABS: 12.5000 mg | ORAL_TABLET | Freq: Three times a day (TID) | ORAL | 0 refills | Status: DC | PRN

## 2023-09-01 MED ORDER — MECLIZINE HCL 25 MG PO TABS
12.5000 mg | ORAL_TABLET | Freq: Once | ORAL | Status: AC
  Administered 2023-09-01: 12.5 mg via ORAL
  Filled 2023-09-01: qty 1

## 2023-09-01 NOTE — ED Provider Notes (Signed)
[facility] AT [facility] Provider Note   CSN: [id number] Arrival date & time: 09/01/23  1004     History  Chief Complaint  Patient presents with   Weakness    Taylor Schroeder is a 57 y.o. male.  Pt c/o dizziness in past day. Started yesterday around noon, persistent/constant since. Indicates sense of imbalance or unsteadiness. No definite room spinning. No faintness or syncope. Indicates intermittent mild headache. No acute, abrupt, or severe head pain. No associated numbness or weakness. No change in speech or vision. No falls. Denies current sinus pain or congestion. No ear pain, tinnitus or hearing loss. No hx vertigo. No blood loss, rectal bleeding or melena. No recent change in meds or new meds. No fever or chills.   The history is provided by the patient, the spouse and medical records.  Weakness Associated symptoms: dizziness and headaches   Associated symptoms: no abdominal pain, no chest pain, no cough, no dysuria, no fever, no nausea, no shortness of breath and no vomiting        Home Medications Prior to Admission medications   Medication Sig Start Date End Date Taking? Authorizing Provider  alfuzosin (UROXATRAL) 10 MG 24 hr tablet Take 1 tablet (10 mg total) by mouth daily with breakfast. 07/12/23   Etta Grandchild, MD  CVS D3 50 MCG (2000 UT) CAPS TAKE 2 CAPSULES BY MOUTH EVERY DAY 11/01/19   Etta Grandchild, MD  DULoxetine (CYMBALTA) 30 MG capsule TAKE 1 CAPSULE BY MOUTH EVERY DAY 08/09/23   Nita Sickle K, DO  esomeprazole (NEXIUM) 40 MG capsule TAKE ONE CAPSULE BY MOUTH EVERY MORNING BEFORE BREAKFAST 06/27/23   Iva Boop, MD  fluocinonide cream (LIDEX) 0.05 % Apply 1 application topically 2 (two) times daily. 08/20/20   Etta Grandchild, MD  levocetirizine (XYZAL) 5 MG tablet Take 2 tablets (10 mg total) by mouth every evening. 12/08/21   Etta Grandchild, MD  Multiple Vitamin (MULTIVITAMIN) LIQD Take 5 mLs by mouth daily.     [provider]  telmisartan (MICARDIS) 80 MG tablet TAKE 1 TABLET BY MOUTH EVERY DAY 08/28/23   Etta Grandchild, MD      Allergies    Allegra [fexofenadine hcl], Crestor [rosuvastatin], Hctz [hydrochlorothiazide], and Demerol [meperidine]    Review of Systems   Review of Systems  Constitutional:  Negative for chills and fever.  HENT:  Negative for hearing loss, sinus pain, sore throat and tinnitus.   Eyes:  Negative for pain, redness and visual disturbance.  Respiratory:  Negative for cough and shortness of breath.   Cardiovascular:  Negative for chest pain, palpitations and leg swelling.  Gastrointestinal:  Negative for abdominal pain, nausea and vomiting.  Genitourinary:  Negative for dysuria and flank pain.  Musculoskeletal:  Negative for back pain, neck pain and neck stiffness.  Neurological:  Positive for dizziness, weakness and headaches. Negative for syncope, speech difficulty and numbness.  Psychiatric/Behavioral:  Negative for confusion.     Physical Exam Updated Vital Signs BP (!) 142/92 (BP Location: Right Arm)   Pulse 81   Temp 97.9 F (36.6 C) (Oral)   Resp 16   Wt 107.5 kg   SpO2 98%   BMI 35.00 kg/m  Physical Exam Vitals and nursing note reviewed.  Constitutional:      Appearance: Normal appearance. He is well-developed.  HENT:     Head: Atraumatic.     Comments: No sinus or temporal tenderness.  Right Ear: Tympanic membrane normal.     Left Ear: Tympanic membrane normal.     Nose: Nose normal.     Mouth/Throat:     Mouth: Mucous membranes are moist.     Pharynx: Oropharynx is clear.  Eyes:     General: No scleral icterus.    Extraocular Movements: Extraocular movements intact.     Conjunctiva/sclera: Conjunctivae normal.     Pupils: Pupils are equal, round, and reactive to light.  Neck:     Vascular: No carotid bruit.     Trachea: No tracheal deviation.  Cardiovascular:     Rate and Rhythm: Normal rate and regular rhythm.     Pulses:  Normal pulses.     Heart sounds: Normal heart sounds. No murmur heard.    No friction rub. No gallop.  Pulmonary:     Effort: Pulmonary effort is normal. No accessory muscle usage or respiratory distress.     Breath sounds: Normal breath sounds.  Abdominal:     General: There is no distension.     Palpations: Abdomen is soft. There is no mass.     Tenderness: There is no abdominal tenderness.  Musculoskeletal:        General: No swelling or tenderness.     Cervical back: Normal range of motion and neck supple. No rigidity.     Right lower leg: No edema.     Left lower leg: No edema.  Skin:    General: Skin is warm and dry.     Findings: No rash.  Neurological:     Mental Status: He is alert.     Comments: Alert, speech clear. No dysarthria or aphasia. Motor/sens grossly intact bil. Stre 5/5. No pronator drift. Sens grossly intact. Feels unsteady on feet. No nystagmus noted.   Psychiatric:        Mood and Affect: Mood normal.     ED Results / Procedures / Treatments   Labs (all labs ordered are listed, but only abnormal results are displayed) Results for orders placed or performed during the hospital encounter of 09/01/23  CBC  Result Value Ref Range   WBC 7.7 4.0 - 10.5 K/uL   RBC 4.69 4.22 - 5.81 MIL/uL   Hemoglobin 14.0 13.0 - 17.0 g/dL   HCT 40.9 81.1 - 91.4 %   MCV 89.3 80.0 - 100.0 fL   MCH 29.9 26.0 - 34.0 pg   MCHC 33.4 30.0 - 36.0 g/dL   RDW 78.2 95.6 - 21.3 %   Platelets 253 150 - 400 K/uL   nRBC 0.0 0.0 - 0.2 %  Comprehensive metabolic panel  Result Value Ref Range   Sodium 139 135 - 145 mmol/L   Potassium 3.9 3.5 - 5.1 mmol/L   Chloride 111 98 - 111 mmol/L   CO2 22 22 - 32 mmol/L   Glucose, Bld 89 70 - 99 mg/dL   BUN 18 6 - 20 mg/dL   Creatinine, Ser 0.86 0.61 - 1.24 mg/dL   Calcium 8.0 (L) 8.9 - 10.3 mg/dL   Total Protein 5.6 (L) 6.5 - 8.1 g/dL   Albumin 3.2 (L) 3.5 - 5.0 g/dL   AST 24 15 - 41 U/L   ALT 14 0 - 44 U/L   Alkaline Phosphatase 44 38 -  126 U/L   Total Bilirubin 1.0 <1.2 mg/dL   GFR, Estimated >57 >84 mL/min   Anion gap 6 5 - 15       EKG EKG Interpretation Date/Time:  Thursday September 01 2023 10:10:57 EST Ventricular Rate:  75 PR Interval:  236 QRS Duration:  84 QT Interval:  377 QTC Calculation: 421 R Axis:   1  Text Interpretation: Sinus rhythm Prolonged PR interval Confirmed by Cathren Laine ([id number]) on 09/01/2023 10:51:21 AM  Radiology MR BRAIN WO CONTRAST  Result Date: 09/01/2023 CLINICAL DATA:  Neuro deficit, acute, stroke suspected. EXAM: MRI HEAD WITHOUT CONTRAST TECHNIQUE: Multiplanar, multiecho pulse sequences of the brain and surrounding structures were obtained without intravenous contrast. COMPARISON:  MRI of the brain June 12, 2019. FINDINGS: Brain: No acute infarction, hemorrhage, hydrocephalus, extra-axial collection or mass lesion. Small amount of scattered foci of T2 hyperintensity within the white matter of the cerebral hemispheres, nonspecific. Vascular: Normal flow voids. Skull and upper cervical spine: Normal marrow signal. Sinuses/Orbits: Paranasal sinuses are clear, the orbits are maintained. Bilateral mastoid effusion, right greater than left similar to prior MRI. Other: None. IMPRESSION: 1. No acute intracranial abnormality. 2. Small amount of scattered foci of T2 hyperintensity within the white matter of the cerebral hemispheres, nonspecific but may represent early chronic microvascular ischemic changes. 3. Bilateral mastoid effusion, right greater than left, similar to prior MRI. Electronically Signed   By: Baldemar Lenis M.D.   On: 09/01/2023 14:55    Procedures Procedures    Medications Ordered in ED Medications  meclizine (ANTIVERT) tablet 12.5 mg (12.5 mg Oral Given 09/01/23 1117)    ED Course/ Medical Decision Making/ A&P                                 Medical Decision Making Problems Addressed: Dizziness: acute illness or injury with systemic symptoms that  poses a threat to life or bodily functions Elevated blood pressure reading: acute illness or injury Essential hypertension: chronic illness or injury with exacerbation, progression, or side effects of treatment that poses a threat to life or bodily functions Intermittent headache: acute illness or injury  Amount and/or Complexity of Data Reviewed Independent Historian: spouse and EMS    Details: hx External Data Reviewed: notes. Labs: ordered. Decision-making details documented in ED Course. Radiology: ordered and independent interpretation performed. Decision-making details documented in ED Course. ECG/medicine tests: ordered and independent interpretation performed. Decision-making details documented in ED Course.  Risk Prescription drug management. Decision regarding hospitalization.   Iv ns. Continuous pulse ox and cardiac monitoring. Labs ordered/sent. Imaging ordered.   Differential diagnosis includes vertigo, central vs peripheral, cva, anemia, dehydration, etc. Dispo decision including potential need for admission considered - will get labs and imaging and reassess.   Reviewed nursing notes and prior charts for additional history. External reports reviewed. Additional history from: spouse.   Cardiac monitor: sinus rhythm, rate 80.  Labs reviewed/interpreted by me - bmet/chem normal. Hgb normal. Wbc normal.   MRI reviewed/interpreted by me - neg for acute cva.   Po fluids/food. Ambulate in hall. Earlier symptoms improved.   No focal neuro deficit on exam, no ataxia, no pain. Pt currently appears stable for d/c. Rec close pcp f/u.   Return precautions provided.          Final Clinical Impression(s) / ED Diagnoses Final diagnoses:  Dizziness  Intermittent headache    Rx / DC Orders ED Discharge Orders     None         Cathren Laine, MD 09/01/23 1510

## 2023-09-01 NOTE — Discharge Instructions (Addendum)
It was our pleasure to provide your ER care today - we hope that you feel better.  Drink plenty of fluids/stay well hydrated. Take antivert as need for dizziness - no driving when taking, or if/when feeling dizzy.  Follow up closely with primary care doctor in one week. Also follow up with your doctor regarding your blood pressure that is high today.   Return to ER if worse, new symptoms, fevers, chest pain, trouble breathing, fainting, change in speech or vision, one-sided numbness/weakness, severe dizziness, or other concern.

## 2023-09-01 NOTE — ED Notes (Signed)
Labs sent at this time.

## 2023-09-01 NOTE — Progress Notes (Signed)
Subjective:     Patient ID: Taylor Schroeder, male    DOB: [birth year], 57 y.o.   MRN: [id number]  Chief Complaint  Patient presents with   Dizziness    Vertigo started yesterday with a headache in the morning but stopped last night and got up this morning and was so much worse than yesterday. Got so dizzy this morning tried to throw up but couldn't have anything come up. Dizzyness can be sitting, standing or just laying    Dizziness Associated symptoms include congestion, headaches, nausea, vomiting and weakness. Pertinent negatives include no abdominal pain, chest pain, chills or fever.     History of Present Illness         C/o dizziness that started yesterday. He was laying on his right side under a house working. Dizziness started when he sat up and he felt sick like he would vomit. He sat still and it resolved. It was recurrent throughout the day.   Falling towards the left.  Trouble focusing eyes. Cannot move eyes without worse.   Headache yesterday but not today.   States he has sinus pressure.   This morning dizziness is worse.  He was dry heaving this morning.   Denies fever, chills, chest pain, palpitations, shortness of breath.      Health Maintenance Due  Topic Date Due   COVID-19 Vaccine (1) Never done   Zoster Vaccines- Shingrix (1 of 2) Never done   Colonoscopy  05/25/2023    Past Medical History:  Diagnosis Date   Arthritis    "qwhere" (02/03/2017)   BPH (benign prostatic hyperplasia)    Childhood asthma    "as a baby"   Coronary artery disease Non-obstructive   a. nonobs cath in 2006;  b. 01/2013 Cath: LM nl, LAD 81m, LCX nl, RCA non-dom, nl. EF 60-65%.   COVID    Esophageal ring, acquired 07/12/2013   GERD (gastroesophageal reflux disease)    H/O hiatal hernia    "think so" (02/06/2014)   Headache    "resolved w/BP control in 11/2015" (02/03/2017)   High cholesterol    History of kidney stones    Hypertension    Ileitis 2014   Suspected  Crohn's, think not as time has gone on   NAFLD (nonalcoholic fatty liver disease) 2/53/6644   Obesity    Pernicious anemia-B12 deficiency 04/18/2013   PONV (postoperative nausea and vomiting) 02/03/2017; 08/2016   Sleep apnea    cpap every night    Past Surgical History:  Procedure Laterality Date   APPENDECTOMY     CARDIAC CATHETERIZATION  01/2013   "results were clear"   CARPAL TUNNEL RELEASE Right    CHOLECYSTECTOMY N/A 02/03/2017   Procedure: LAPAROSCOPIC CHOLECYSTECTOMY;  Surgeon: Gaynelle Adu, MD;  [facility];  Service: General;  Laterality: N/A;   COLONOSCOPY  ~ 2014   COLONOSCOPY     ESOPHAGOGASTRODUODENOSCOPY  ~ 2014   INGUINAL HERNIA REPAIR  1966   "? side"   KNEE ARTHROPLASTY Left 02/06/2014   Procedure: COMPUTER ASSISTED TOTAL KNEE ARTHROPLASTY;  Surgeon: Eldred Manges, MD;  Location: [facility] OR;  Service: Orthopedics;  Laterality: Left;  Cemented Left Total Knee Arthroplasty   KNEE ARTHROSCOPY Left 08/2016   scar tissue cleaned out   LAPAROSCOPIC CHOLECYSTECTOMY  02/03/2017   LEFT HEART CATHETERIZATION WITH CORONARY ANGIOGRAM N/A 01/29/2013   Procedure: LEFT HEART CATHETERIZATION WITH CORONARY ANGIOGRAM;  Surgeon: [name], MD;  Location: [facility] CATH LAB;  Service: Cardiovascular;  Laterality:  N/A;   MULTIPLE TOOTH EXTRACTIONS  09/2016   REVERSE SHOULDER ARTHROPLASTY Left 07/31/2019   Procedure: LEFT REVERSE SHOULDER ARTHROPLASTY;  Surgeon: Cammy Copa, MD;  Location: [facility] OR;  Service: Orthopedics;  Laterality: Left;   SHOULDER ARTHROSCOPY Left 03/12/2013   TOTAL KNEE ARTHROPLASTY Left 02/06/2014   TOTAL KNEE ARTHROPLASTY Right 09/14/2017   TOTAL KNEE ARTHROPLASTY Right 09/14/2017   Procedure: RIGHT TOTAL KNEE ARTHROPLASTY;  Surgeon: Eldred Manges, MD;  Location: [facility] OR;  Service: Orthopedics;  Laterality: Right;    Family History  Problem Relation Age of Onset   Hypertension Mother    Cervical cancer Mother    Cancer Father        type unknown   Emphysema  Maternal Grandmother    Emphysema Maternal Grandfather    Other Paternal Grandmother        spinal cancer   COPD Other    Alcohol abuse Neg Hx    Diabetes Neg Hx    Early death Neg Hx    Heart disease Neg Hx    Hyperlipidemia Neg Hx    Kidney disease Neg Hx    Stroke Neg Hx    Colon cancer Neg Hx    Stomach cancer Neg Hx    Esophageal cancer Neg Hx    Rectal cancer Neg Hx    Liver cancer Neg Hx     Social History   Socioeconomic History   Marital status: Married    Spouse name: Not on file   Number of children: 2   Years of education: Not on file   Highest education level: Not on file  Occupational History   Occupation: MAINTENANCE    Employer: [employer]  Tobacco Use   Smoking status: Former    Current packs/day: 0.00    Types: Cigarettes    Quit date: 08/29/1978    Years since quitting: 45.0   Smokeless tobacco: Never  Vaping Use   Vaping status: Never Used  Substance and Sexual Activity   Alcohol use: Yes    Comment: Occasional Drink- 1-2 times per month   Drug use: No   Sexual activity: Yes  Other Topics Concern   Not on file  Social History Narrative   Married, 2 daughters   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 90 tablet 0   CVS D3 50 MCG (2000 UT) CAPS TAKE 2 CAPSULES BY MOUTH EVERY DAY 180 capsule 1   DULoxetine  (CYMBALTA) 30 MG capsule TAKE 1 CAPSULE BY MOUTH EVERY DAY 90 capsule 1   esomeprazole (NEXIUM) 40 MG capsule TAKE ONE CAPSULE BY MOUTH EVERY MORNING BEFORE BREAKFAST 90 capsule 0   fluocinonide cream (LIDEX) 0.05 % Apply 1 application topically 2 (two) times daily. 120  g 1   levocetirizine (XYZAL) 5 MG tablet Take 2 tablets (10 mg total) by mouth every evening. 180 tablet 0   Multiple Vitamin (MULTIVITAMIN) LIQD Take 5 mLs by mouth daily.     telmisartan (MICARDIS) 80 MG tablet TAKE 1 TABLET BY MOUTH EVERY DAY 90 tablet 0   No facility-administered medications prior to visit.    Allergies  Allergen Reactions   Allegra [Fexofenadine Hcl] Hives   Crestor [Rosuvastatin] Other (See Comments)    Myalgias    Hctz [Hydrochlorothiazide] Other (See Comments)    Weak and muscle cramps    Demerol [Meperidine] Nausea And Vomiting    "Bad things" happen when I take it"    Review of Systems  Constitutional:  Negative for chills and fever.  HENT:  Positive for congestion and tinnitus.   Eyes:  Positive for double vision, photophobia and pain.  Respiratory:  Negative for shortness of breath.   Cardiovascular:  Negative for chest pain, palpitations and leg swelling.  Gastrointestinal:  Positive for nausea and vomiting. Negative for abdominal pain, constipation and diarrhea.  Genitourinary:  Negative for dysuria, frequency and urgency.  Neurological:  Positive for dizziness, weakness and headaches. Negative for focal weakness.       Objective:    Physical Exam Constitutional:      General: He is not in acute distress.    Appearance: He is ill-appearing.  HENT:     Head: Atraumatic.     Right Ear: Tympanic membrane and ear canal normal.     Left Ear: Tympanic membrane and ear canal normal.     Mouth/Throat:     Mouth: Mucous membranes are moist.     Pharynx: Oropharynx is clear.  Eyes:     General: Lids are normal.     Extraocular Movements:     Right eye: Normal extraocular motion.      Left eye: Normal extraocular motion.     Conjunctiva/sclera: Conjunctivae normal.     Pupils: Pupils are equal, round, and reactive to light.  Cardiovascular:     Rate and Rhythm: Normal rate and regular rhythm.  Pulmonary:     Effort: Pulmonary effort is normal.     Breath sounds: Normal breath sounds.  Musculoskeletal:     Cervical back: Normal range of motion and neck supple.  Skin:    General: Skin is warm and dry.  Neurological:     General: No focal deficit present.     Mental Status: He is alert and oriented to person, place, and time.     Cranial Nerves: No facial asymmetry.     Sensory: Sensation is intact.     Motor: No weakness or pronator drift.     Coordination: Romberg sign positive. Coordination abnormal. Finger-Nose-Finger Test abnormal.     Gait: Gait abnormal and tandem walk abnormal.  Psychiatric:        Attention and Perception: Attention normal.        Mood and Affect: Mood normal.        Speech: Speech normal.        Behavior: Behavior normal.        Thought Content: Thought content normal.      BP (!) 148/100 (BP Location: Left Arm, Patient Position: Sitting, Cuff Size: Large)   Pulse 84   Temp 97.6 F (36.4 C) (Temporal)   Ht 5\' 9"  (1.753 m)   Wt 237 lb (107.5 kg)   SpO2 98%   BMI 35.00 kg/m  Wt  Readings from Last 3 Encounters:  09/01/23 237 lb (107.5 kg)  08/03/23 239 lb 9.6 oz (108.7 kg)  07/18/23 246 lb (111.6 kg)       Assessment & Plan:   Problem List Items Addressed This Visit   None Visit Diagnoses     Dizziness    -  Primary   Ataxia       Nausea       Unable to balance when standing       Acute nonintractable headache, unspecified headache type       Diplopia          Here with c/o severe dizziness, unable to stand-falling to left, diplopia concerning to acute CVA. Dizziness intermittent yesterday. Symptoms worsened this morning. Wife is with him.  No obvious facial droop, pronator drift or focal weakness at this  time. Neck pain-chronic per patient. Denies chest pain or shortness of breath.  EMS called for possible stroke.   I am having Lyda Jester D. Atwood "Darren" maintain his CVS D3, fluocinonide cream, levocetirizine, multivitamin, esomeprazole, alfuzosin, DULoxetine, and telmisartan.  No orders of the defined types were placed in this encounter.

## 2023-09-01 NOTE — ED Triage Notes (Signed)
BIB [facility] from [facility] for dizziness, L sided weakness, R pupillary changes, nausea, HA, and ataxia. Onset yesterday am. Intermittent through yesterday, resolved yesterday afternoon, and returned this am upon waking. Rates HA 4/10 for EMS, and 7/10 upon arrival. R upil 2mm and non-reactive, L pupil 3mm and reactive per EMS. Zofran 4mg  given in 20g L FA NSL. CBG 141. Able to stand a pivot. LVO 2, but outside timeframe for EMS. VSS. NSR on monitor. HR 90.

## 2023-09-14 ENCOUNTER — Telehealth: Payer: Self-pay | Admitting: Neurology

## 2023-09-14 DIAGNOSIS — R202 Paresthesia of skin: Secondary | ICD-10-CM

## 2023-09-14 DIAGNOSIS — R292 Abnormal reflex: Secondary | ICD-10-CM

## 2023-09-14 NOTE — Telephone Encounter (Signed)
Pt is returning a call to [facility]

## 2023-09-15 NOTE — Telephone Encounter (Signed)
See result note.  

## 2023-09-22 ENCOUNTER — Other Ambulatory Visit: Payer: Self-pay

## 2023-09-22 MED ORDER — MECLIZINE HCL 12.5 MG PO TABS: 12.5000 mg | ORAL_TABLET | Freq: Three times a day (TID) | ORAL | 0 refills | Status: AC | PRN

## 2023-09-22 NOTE — Telephone Encounter (Signed)
Copied from CRM [id number]. Topic: Clinical - Medication Refill >> Sep 22, 2023  3:46 PM Louie Boston wrote: Most Recent Primary Care Visit:  Provider: Avanell Shackleton  Department: [facility]  Visit Type: SAME DAY  Date: 09/01/2023  Medication: meclizine (ANTIVERT) 12.5 MG tablet   Has the patient contacted their pharmacy? No (Agent: If no, request that the patient contact the pharmacy for the refill. If patient does not wish to contact the pharmacy document the reason why and proceed with request.) (Agent: If yes, when and what did the pharmacy advise?)  Patient did not contact pharmacy because prescription bottle shows no refills.   Is this the correct pharmacy for this prescription? Yes If no, delete pharmacy and type the correct one.  This is the patient's preferred pharmacy:  [id number] - , [place] - [address] AT [address] OF [facility] [address] [place] [address] Phone: [phone] Fax: [phone]   Has the prescription been filled recently? Yes  Is the patient out of the medication? Yes  Has the patient been seen for an appointment in the last year OR does the patient have an upcoming appointment? Yes  Can we respond through MyChart? Yes  Agent: Please be advised that Rx refills may take up to 3 business days. We ask that you follow-up with your pharmacy.

## 2023-09-30 ENCOUNTER — Encounter: Payer: Self-pay | Admitting: Physical Therapy

## 2023-09-30 ENCOUNTER — Other Ambulatory Visit: Payer: Self-pay

## 2023-09-30 ENCOUNTER — Ambulatory Visit: Payer: 59 | Attending: Neurology | Admitting: Physical Therapy

## 2023-09-30 VITALS — BP 143/96 | HR 76

## 2023-09-30 DIAGNOSIS — M546 Pain in thoracic spine: Secondary | ICD-10-CM | POA: Diagnosis present

## 2023-09-30 DIAGNOSIS — M542 Cervicalgia: Secondary | ICD-10-CM | POA: Diagnosis present

## 2023-09-30 DIAGNOSIS — M5459 Other low back pain: Secondary | ICD-10-CM | POA: Diagnosis present

## 2023-09-30 DIAGNOSIS — R42 Dizziness and giddiness: Secondary | ICD-10-CM | POA: Insufficient documentation

## 2023-09-30 DIAGNOSIS — R292 Abnormal reflex: Secondary | ICD-10-CM | POA: Diagnosis not present

## 2023-09-30 DIAGNOSIS — R202 Paresthesia of skin: Secondary | ICD-10-CM | POA: Diagnosis not present

## 2023-09-30 NOTE — Therapy (Signed)
 OUTPATIENT PHYSICAL THERAPY THORACOLUMBAR EVALUATION   Patient Name: Taylor Schroeder MRN: [id number] DOB:1966-05-16, 58 y.o., male Today's Date: 09/30/2023  END OF SESSION:  PT End of Session - 09/30/23 0806     Visit Number 1    Number of Visits 9    Date for PT Re-Evaluation 11/18/23    Authorization Type [employer] / [employer]ul[employer]    PT Start Time [phone]    PT Stop Time 0845    PT Time Calculation (min) 39 min    Equipment Utilized During Treatment Gait belt    Activity Tolerance Patient tolerated treatment well    Behavior During Therapy WFL for tasks assessed/performed             Past Medical History:  Diagnosis Date   Arthritis    qwhere (02/03/2017)   BPH (benign prostatic hyperplasia)    Childhood asthma    as a baby   Coronary artery disease Non-obstructive   a. nonobs cath in 2006;  b. 01/2013 Cath: LM nl, LAD 35m, LCX nl, RCA non-dom, nl. EF 60-65%.   COVID    Esophageal ring, acquired 07/12/2013   GERD (gastroesophageal reflux disease)    H/O hiatal hernia    think so (02/06/2014)   Headache    resolved w/BP control in 11/2015 (02/03/2017)   High cholesterol    History of kidney stones    Hypertension    Ileitis 2014   Suspected Crohn's, think not as time has gone on   NAFLD (nonalcoholic fatty liver disease) 3/83/7978   Obesity    Pernicious anemia-B12 deficiency 04/18/2013   PONV (postoperative nausea and vomiting) 02/03/2017; 08/2016   Sleep apnea    cpap every night   Past Surgical History:  Procedure Laterality Date   APPENDECTOMY     CARDIAC CATHETERIZATION  01/2013   results were clear   CARPAL TUNNEL RELEASE Right    CHOLECYSTECTOMY N/A 02/03/2017   Procedure: LAPAROSCOPIC CHOLECYSTECTOMY;  Surgeon: Tanda Locus, MD;  Location: [facility] OR;  Service: General;  Laterality: N/A;   COLONOSCOPY  ~ 2014   COLONOSCOPY     ESOPHAGOGASTRODUODENOSCOPY  ~ 2014   INGUINAL HERNIA REPAIR  1966   ? side   KNEE ARTHROPLASTY Left 02/06/2014   Procedure:  COMPUTER ASSISTED TOTAL KNEE ARTHROPLASTY;  Surgeon: Oneil JAYSON Herald, MD;  Location: [facility] OR;  Service: Orthopedics;  Laterality: Left;  Cemented Left Total Knee Arthroplasty   KNEE ARTHROSCOPY Left 08/2016   scar tissue cleaned out   LAPAROSCOPIC CHOLECYSTECTOMY  02/03/2017   LEFT HEART CATHETERIZATION WITH CORONARY ANGIOGRAM N/A 01/29/2013   Procedure: LEFT HEART CATHETERIZATION WITH CORONARY ANGIOGRAM;  Surgeon: Aleene JINNY Passe, MD;  Location: [facility] CATH LAB;  Service: Cardiovascular;  Laterality: N/A;   MULTIPLE TOOTH EXTRACTIONS  09/2016   REVERSE SHOULDER ARTHROPLASTY Left 07/31/2019   Procedure: LEFT REVERSE SHOULDER ARTHROPLASTY;  Surgeon: Addie Cordella Hamilton, MD;  Location: [facility] OR;  Service: Orthopedics;  Laterality: Left;   SHOULDER ARTHROSCOPY Left 03/12/2013   TOTAL KNEE ARTHROPLASTY Left 02/06/2014   TOTAL KNEE ARTHROPLASTY Right 09/14/2017   TOTAL KNEE ARTHROPLASTY Right 09/14/2017   Procedure: RIGHT TOTAL KNEE ARTHROPLASTY;  Surgeon: Herald Oneil JAYSON, MD;  Location: [facility] OR;  Service: Orthopedics;  Laterality: Right;   Patient Active Problem List   Diagnosis Date Noted   ST (skin tag) 07/12/2023   Flu vaccine need 07/12/2023   Chest pain 12/03/2022   Need for hepatitis C screening test 11/29/2022   Unequal blood pressure in  upper extremities 11/29/2022   Mild intermittent asthma without complication 11/29/2022   Abnormal electrocardiogram 12/08/2021   Chronic pansinusitis 10/16/2020   Encounter for general adult medical examination with abnormal findings 08/20/2020   Intrinsic eczema 08/20/2020   Coital headache 05/19/2020   NAFLD (nonalcoholic fatty liver disease) 93/83/7978   OA (osteoarthritis) of shoulder 07/31/2019   Allergic rhinoconjunctivitis 01/03/2019   Seasonal allergic rhinitis due to pollen 04/11/2018   Vitamin D  deficiency 12/21/2017   Primary osteoarthritis of left shoulder 06/13/2017   Sleep apnea 03/08/2017   Hyperlipidemia with target LDL less than 100 12/22/2016    Primary erectile dysfunction 11/18/2014   Essential hypertension 07/15/2014   DDD (degenerative disc disease), cervical 10/03/2013   Esophageal ring, acquired 07/12/2013   BPH (benign prostatic hyperplasia) 04/18/2013   Pernicious anemia-B12 deficiency 04/18/2013   Severe obesity (BMI >= 40) (HCC) 12/21/2012   GERD (gastroesophageal reflux disease) 12/21/2012   Hyperglycemia 08/29/2012    PCP: Joshua Debby CROME, MD  REFERRING PROVIDER: Patel, Donika K, DO  REFERRING DIAG: R20.2 (ICD-10-CM) - Paresthesias R29.2 (ICD-10-CM) - Hyperreflexia  Rationale for Evaluation and Treatment: Rehabilitation  THERAPY DIAG:  Thoracic back pain, unspecified back pain laterality, unspecified chronicity - Plan: PT plan of care cert/re-cert  Other low back pain - Plan: PT plan of care cert/re-cert  Cervicalgia - Plan: PT plan of care cert/re-cert  Dizziness and giddiness - Plan: PT plan of care cert/re-cert  ONSET DATE: 09/15/2023 (referral)   SUBJECTIVE:                                                                                                                                                                                           SUBJECTIVE STATEMENT: Patient reports that since July, he has been getting sunburn feeling following his back spasms. Patient reports that his back has also been hurting over the last year or so. Patient reports that he doesn't notice his back when he is working [employer] residential but notices it when he sits, rests, tries to sleep. He has been getting muscle cramps and catching. 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 Patient reports that he does not like to take medication to help with pain. Patient also reports recent history of vertigo that he is currently dealing with. Patient reports that he has a TENS unit but does not use it regularly as it is hard to put on. Patient ambulates without AD. Reports  elevated BP readings at times and just took BP medication. Patient reports that he gets vertigo if he leans his head back and  to the L that improved with trial of self Epley at home though not fully resolved. Patient is taking meclizine  and did go to ED to be evaluated and was diagnosed with vertigo.   PERTINENT HISTORY:  Disc protrusion in thoracic spine and cervical spinal stenosis, vertigo  PAIN:  Are you having pain? Yes: NPRS scale: 6-7/10 Pain location: center of back from shoulders blades down Pain description: annoying  Aggravating factors: driving, being in one position for a long time Relieving factors: working   PRECAUTIONS: Fall  RED FLAGS: None   WEIGHT BEARING RESTRICTIONS: Yes not supposed to lift over 15lbs since shoulder surgery  FALLS:  Has patient fallen in last 6 months? No  LIVING ENVIRONMENT: Lives with: lives with their spouse Lives in: House/apartment Stairs: Yes: External: 3 steps; can reach both Has following equipment at home: None  OCCUPATION: HVAC residential work  PLOF: Independent  PATIENT GOALS: I wish my back would quit hurting and I could sleep sometime.  NEXT MD VISIT: not one scheduled that I know of  OBJECTIVE:  Note: Objective measures were completed at Evaluation unless otherwise noted.  DIAGNOSTIC FINDINGS:  IMPRESSION: 1. No acute intracranial abnormality. 2. Small amount of scattered foci of T2 hyperintensity within the white matter of the cerebral hemispheres, nonspecific but may represent early chronic microvascular ischemic changes. 3. Bilateral mastoid effusion, right greater than left, similar to prior MRI.  IMPRESSION: 1. Paracentral disc protrusions bilaterally at T3-4 with mass effect on both sides of the thecal sac. 2. Shallow left paracentral disc protrusion at T4-5 with mild mass effect on the left side of thecal sac. 3. Central and left paracentral disc protrusion at T5-6 with mild mass effect on the thecal  sac. 4. Small central disc protrusion at T8-9 with mild mass effect on the ventral thecal sac. 5. No cord lesions are identified.  IMPRESSION: 1. Advanced degenerative cervical spondylosis as detailed above. 2. Mild to moderate spinal stenosis at C5-6. 3. Right paracentral and foraminal disc protrusion at C5-6 with significant right foraminal stenosis. Moderate left foraminal stenosis also noted. 4. Significant left foraminal stenosis at C3-4 and mild to moderate right foraminal stenosis. 5. Mild/moderate bilateral foraminal stenosis at C4-5. 6. Mild left foraminal stenosis at C6-7.   PATIENT SURVEYS:  Modified Oswestry 14/50 = 28% Impairment  NDI 11/50 = 22% impairment  COGNITION: Overall cognitive status: Within functional limits for tasks assessed     SENSATION: Reports numbness and tingling in first 4 fingers since shoulder surgery  POSTURE: No Significant postural limitations  PALPATION: Hypersensitive to light palpation on mid back around T8-T10  LUMBAR ROM:   AROM eval  Flexion WFL with tightness in hamstring  Extension 50% - pain in mid back, increases with repeated motions  Right lateral flexion 70% - sore  Left lateral flexion 70% - sore  Right rotation WFL  Left rotation WFL   (Blank rows = not tested)  LOWER EXTREMITY ROM:     Gross LE tightness, particulary hamstrings and gastrocs on   LOWER EXTREMITY MMT:    MMT Right eval Left eval  Hip flexion 5/5 5/5  Hip extension    Hip abduction 4/5 4/5  Hip adduction 4/5 4/5  Hip internal rotation    Hip external rotation    Knee flexion 4+/5 4+/5  Knee extension 4+/5 4+/5  Ankle dorsiflexion 5/5 5/5  Ankle plantarflexion    Ankle inversion    Ankle eversion     (Blank rows = not tested)  LUMBAR SPECIAL TESTS:  See repeated motions above  FUNCTIONAL TESTS:  Held due to elevated BP readings in today's session   GAIT: Distance walked: antalgic - to be assessed further in future  sessions Assistive device utilized: None Level of assistance: Modified independence   *Patient briefly demonstrates when he gets vertigo by tipping his head back and to the L, a few beats of nystagmus noted however not formally taken through vestibular eval due to time contraints  TREATMENT:                                                                                                                                Initial Eval only  PATIENT EDUCATION:  Education details: POC, examination findings, goal collaboration  Person educated: Patient Education method: Explanation Education comprehension: verbalized understanding and needs further education  HOME EXERCISE PROGRAM: To be provided   ASSESSMENT:  CLINICAL IMPRESSION: Patient is a 58 y.o. male who was seen today for physical therapy evaluation and treatment for paresthesias related to cervical / thoracic back pain with secondary findings indicative of vertigo with possible BPPV etiology. Patient presents with moderate impairment as indicated by NDI and modified oswestry and demonstrates pain with thoracic extension. Muscular tightness noted in hamstrings. PT also noted a few beats of nystagmus when patient demonstrated in chair how he gets dizzy by leaning head back and to the L-raising suspicion for possible L side BPPV; however, unable to complete full vestibular eval due to time constraints. Patient will benefit from skilled physical therapy to address impairments and progress towards LTGs.   OBJECTIVE IMPAIRMENTS: Abnormal gait, decreased balance, decreased ROM, decreased strength, dizziness, and pain.   ACTIVITY LIMITATIONS: carrying, bending, sitting, standing, and locomotion level  PARTICIPATION LIMITATIONS: meal prep, laundry, community activity, occupation, and yard work  PERSONAL FACTORS: Age, Profession, Time since onset of injury/illness/exacerbation, and 1 comorbidity: see above  are also affecting patient's  functional outcome.   REHAB POTENTIAL: Good  CLINICAL DECISION MAKING: Stable/uncomplicated  EVALUATION COMPLEXITY: Low   GOALS: Goals reviewed with patient? Yes  SHORT TERM GOALS: Target date: 10/21/2023  Patient will report demonstrate independence with initial HEP in order to maintain current gains and continue to progress after physical therapy discharge.   Baseline: To be assessed  Goal status: INITIAL  2.  Assess for vestibular goal and write LTG as indicated Baseline: To be assessed  Goal status: INITIAL   LONG TERM GOALS: Target date: 11/18/2023  Patient will report demonstrate independence with final HEP in order to maintain current gains and continue to progress after physical therapy discharge.   Baseline: To be provided  Goal status: INITIAL  2.  Patient will improve modified ODI score to 15% impairment indicate a clinically important improvement in low back pain.   Baseline: 28% impairment Goal status: INITIAL  3.  Patient will improve NDI score to 12% indicate a clinically important improvement in neck pain.   Baseline: 22%  impairment Goal status: INITIAL  4.  Assess for vestibular goal and write LTG as indicated Baseline: To be assessed  Goal status: INITIAL  PLAN:  PT FREQUENCY: 1-2x/week (2x week for first 3 weeks and dropping down to 1x week for 2 additional weeks)  PT DURATION: 5 weeks   PLANNED INTERVENTIONS: [id number]- PT Re-evaluation, 97110-Therapeutic exercises, 97530- Therapeutic activity, 97112- Neuromuscular re-education, 97535- Self Care, [id number]- Manual therapy, [id number]- Gait training, [phone]- Orthotic Fit/training, [phone]- Canalith repositioning, and Vestibular training.  PLAN FOR NEXT SESSION: vestibular eval (possible L BPPV but advise full eval) + add vestibular goal - assess cervical spine, monitor BP, HEP for thoracic and cervical impairments (note hypersensitive to thoracic back)   Lauraine DELENA Grumbling, PT, DPT 09/30/2023, 12:48 PM

## 2023-10-03 ENCOUNTER — Ambulatory Visit: Payer: 59 | Admitting: Physical Therapy

## 2023-10-03 ENCOUNTER — Encounter: Payer: Self-pay | Admitting: Physical Therapy

## 2023-10-03 VITALS — BP 132/96 | HR 83

## 2023-10-03 DIAGNOSIS — R42 Dizziness and giddiness: Secondary | ICD-10-CM

## 2023-10-03 DIAGNOSIS — M542 Cervicalgia: Secondary | ICD-10-CM

## 2023-10-03 DIAGNOSIS — M546 Pain in thoracic spine: Secondary | ICD-10-CM | POA: Diagnosis not present

## 2023-10-03 DIAGNOSIS — M5459 Other low back pain: Secondary | ICD-10-CM

## 2023-10-03 NOTE — Therapy (Signed)
 OUTPATIENT PHYSICAL THERAPY THORACOLUMBAR EVALUATION   Patient Name: Taylor Schroeder MRN: [id number] DOB:1966/09/10, 58 y.o., male Today's Date: 10/03/2023  END OF SESSION:  PT End of Session - 10/03/23 0803     Visit Number 2    Number of Visits 9    Date for PT Re-Evaluation 11/18/23    Authorization Type [employer] / [place]    PT Start Time 0802    PT Stop Time 0840    PT Time Calculation (min) 38 min    Equipment Utilized During Treatment --    Activity Tolerance Patient tolerated treatment well   limited by dizzines   Behavior During Therapy [facility] for tasks assessed/performed             Past Medical History:  Diagnosis Date   Arthritis    qwhere (02/03/2017)   BPH (benign prostatic hyperplasia)    Childhood asthma    as a baby   Coronary artery disease Non-obstructive   a. nonobs cath in 2006;  b. 01/2013 Cath: LM nl, LAD 41m, LCX nl, RCA non-dom, nl. EF 60-65%.   COVID    Esophageal ring, acquired 07/12/2013   GERD (gastroesophageal reflux disease)    H/O hiatal hernia    think so (02/06/2014)   Headache    resolved w/BP control in 11/2015 (02/03/2017)   High cholesterol    History of kidney stones    Hypertension    Ileitis 2014   Suspected Crohn's, think not as time has gone on   NAFLD (nonalcoholic fatty liver disease) 3/83/7978   Obesity    Pernicious anemia-B12 deficiency 04/18/2013   PONV (postoperative nausea and vomiting) 02/03/2017; 08/2016   Sleep apnea    cpap every night   Past Surgical History:  Procedure Laterality Date   APPENDECTOMY     CARDIAC CATHETERIZATION  01/2013   results were clear   CARPAL TUNNEL RELEASE Right    CHOLECYSTECTOMY N/A 02/03/2017   Procedure: LAPAROSCOPIC CHOLECYSTECTOMY;  Surgeon: Tanda Locus, MD;  Location: [facility] OR;  Service: General;  Laterality: N/A;   COLONOSCOPY  ~ 2014   COLONOSCOPY     ESOPHAGOGASTRODUODENOSCOPY  ~ 2014   INGUINAL HERNIA REPAIR  1966   ? side   KNEE ARTHROPLASTY Left 02/06/2014    Procedure: COMPUTER ASSISTED TOTAL KNEE ARTHROPLASTY;  Surgeon: Oneil JAYSON Herald, MD;  Location: [facility] OR;  Service: Orthopedics;  Laterality: Left;  Cemented Left Total Knee Arthroplasty   KNEE ARTHROSCOPY Left 08/2016   scar tissue cleaned out   LAPAROSCOPIC CHOLECYSTECTOMY  02/03/2017   LEFT HEART CATHETERIZATION WITH CORONARY ANGIOGRAM N/A 01/29/2013   Procedure: LEFT HEART CATHETERIZATION WITH CORONARY ANGIOGRAM;  Surgeon: Aleene JINNY Passe, MD;  Location: [facility] CATH LAB;  Service: Cardiovascular;  Laterality: N/A;   MULTIPLE TOOTH EXTRACTIONS  09/2016   REVERSE SHOULDER ARTHROPLASTY Left 07/31/2019   Procedure: LEFT REVERSE SHOULDER ARTHROPLASTY;  Surgeon: Addie Cordella Hamilton, MD;  Location: [facility] OR;  Service: Orthopedics;  Laterality: Left;   SHOULDER ARTHROSCOPY Left 03/12/2013   TOTAL KNEE ARTHROPLASTY Left 02/06/2014   TOTAL KNEE ARTHROPLASTY Right 09/14/2017   TOTAL KNEE ARTHROPLASTY Right 09/14/2017   Procedure: RIGHT TOTAL KNEE ARTHROPLASTY;  Surgeon: Herald Oneil JAYSON, MD;  Location: [facility] OR;  Service: Orthopedics;  Laterality: Right;   Patient Active Problem List   Diagnosis Date Noted   ST (skin tag) 07/12/2023   Flu vaccine need 07/12/2023   Chest pain 12/03/2022   Need for hepatitis C screening test 11/29/2022   Unequal  blood pressure in upper extremities 11/29/2022   Mild intermittent asthma without complication 11/29/2022   Abnormal electrocardiogram 12/08/2021   Chronic pansinusitis 10/16/2020   Encounter for general adult medical examination with abnormal findings 08/20/2020   Intrinsic eczema 08/20/2020   Coital headache 05/19/2020   NAFLD (nonalcoholic fatty liver disease) 93/83/7978   OA (osteoarthritis) of shoulder 07/31/2019   Allergic rhinoconjunctivitis 01/03/2019   Seasonal allergic rhinitis due to pollen 04/11/2018   Vitamin D  deficiency 12/21/2017   Primary osteoarthritis of left shoulder 06/13/2017   Sleep apnea 03/08/2017   Hyperlipidemia with target LDL less than 100  12/22/2016   Primary erectile dysfunction 11/18/2014   Essential hypertension 07/15/2014   DDD (degenerative disc disease), cervical 10/03/2013   Esophageal ring, acquired 07/12/2013   BPH (benign prostatic hyperplasia) 04/18/2013   Pernicious anemia-B12 deficiency 04/18/2013   Severe obesity (BMI >= 40) (HCC) 12/21/2012   GERD (gastroesophageal reflux disease) 12/21/2012   Hyperglycemia 08/29/2012    PCP: Joshua Debby CROME, MD  REFERRING PROVIDER: Patel, Donika K, DO  REFERRING DIAG: R20.2 (ICD-10-CM) - Paresthesias R29.2 (ICD-10-CM) - Hyperreflexia  Rationale for Evaluation and Treatment: Rehabilitation  THERAPY DIAG:  Thoracic back pain, unspecified back pain laterality, unspecified chronicity  Other low back pain  Dizziness and giddiness  Cervicalgia  ONSET DATE: 09/15/2023 (referral)   SUBJECTIVE:                                                                                                                                                                                           SUBJECTIVE STATEMENT: Has been having vertigo for over the past month. Reports it was really bad this weekend with the weather. Can get it when he looks up or lays down. Wants to worry about the vertigo first before he can worry about the back stuff. Pt reporting he took his BP medication this morning   PERTINENT HISTORY:  Disc protrusion in thoracic spine and cervical spinal stenosis, vertigo  PAIN:  Are you having pain? Yes: NPRS scale: 3/10 - reports pain is not bad this morning  Pain location: center of back from shoulders blades down Pain description: annoying  Aggravating factors: driving, being in one position for a long time Relieving factors: working   Vitals:   10/03/23 0813  BP: (!) 132/96  Pulse: 83     PRECAUTIONS: Fall  RED FLAGS: None   WEIGHT BEARING RESTRICTIONS: Yes not supposed to lift over 15lbs since shoulder surgery  FALLS:  Has patient fallen in last 6  months? No  LIVING ENVIRONMENT: Lives with: lives with their spouse Lives in: House/apartment Stairs: Yes: External: 3 steps; can  reach both Has following equipment at home: None  OCCUPATION: HVAC residential work  PLOF: Independent  PATIENT GOALS: I wish my back would quit hurting and I could sleep sometime.  NEXT MD VISIT: not one scheduled that I know of  OBJECTIVE:  Note: Objective measures were completed at Evaluation unless otherwise noted.  DIAGNOSTIC FINDINGS:  IMPRESSION: 1. No acute intracranial abnormality. 2. Small amount of scattered foci of T2 hyperintensity within the white matter of the cerebral hemispheres, nonspecific but may represent early chronic microvascular ischemic changes. 3. Bilateral mastoid effusion, right greater than left, similar to prior MRI.  IMPRESSION: 1. Paracentral disc protrusions bilaterally at T3-4 with mass effect on both sides of the thecal sac. 2. Shallow left paracentral disc protrusion at T4-5 with mild mass effect on the left side of thecal sac. 3. Central and left paracentral disc protrusion at T5-6 with mild mass effect on the thecal sac. 4. Small central disc protrusion at T8-9 with mild mass effect on the ventral thecal sac. 5. No cord lesions are identified.  IMPRESSION: 1. Advanced degenerative cervical spondylosis as detailed above. 2. Mild to moderate spinal stenosis at C5-6. 3. Right paracentral and foraminal disc protrusion at C5-6 with significant right foraminal stenosis. Moderate left foraminal stenosis also noted. 4. Significant left foraminal stenosis at C3-4 and mild to moderate right foraminal stenosis. 5. Mild/moderate bilateral foraminal stenosis at C4-5. 6. Mild left foraminal stenosis at C6-7.   PATIENT SURVEYS:  Modified Oswestry 14/50 = 28% Impairment  NDI 11/50 = 22% impairment  COGNITION: Overall cognitive status: Within functional limits for tasks assessed     SENSATION: Reports  numbness and tingling in first 4 fingers since shoulder surgery  POSTURE: No Significant postural limitations  PALPATION: Hypersensitive to light palpation on mid back around T8-T10  LUMBAR ROM:   AROM eval  Flexion WFL with tightness in hamstring  Extension 50% - pain in mid back, increases with repeated motions  Right lateral flexion 70% - sore  Left lateral flexion 70% - sore  Right rotation WFL  Left rotation WFL   (Blank rows = not tested)  LOWER EXTREMITY ROM:     Gross LE tightness, particulary hamstrings and gastrocs on   LOWER EXTREMITY MMT:    MMT Right eval Left eval  Hip flexion 5/5 5/5  Hip extension    Hip abduction 4/5 4/5  Hip adduction 4/5 4/5  Hip internal rotation    Hip external rotation    Knee flexion 4+/5 4+/5  Knee extension 4+/5 4+/5  Ankle dorsiflexion 5/5 5/5  Ankle plantarflexion    Ankle inversion    Ankle eversion     (Blank rows = not tested)  LUMBAR SPECIAL TESTS:  See repeated motions above  FUNCTIONAL TESTS:  Held due to elevated BP readings in today's session   GAIT: Distance walked: antalgic - to be assessed further in future sessions Assistive device utilized: None Level of assistance: Modified independence    TREATMENT:  VESTIBULAR ASSESSMENT   GENERAL OBSERVATION: Pt ambulates in independently with no AD.     SYMPTOM BEHAVIOR:   Subjective history: Pt reports vertigo for over a month    Non-Vestibular symptoms: tinnitus and nausea/vomiting   Type of dizziness: Swimmyheaded   Frequency: Daily   Duration: Varies, 10 seconds to about a minute    Aggravating factors: Induced by position change: lying supine, supine to sit, and laying on his L side and looking up  and Induced by motion: looking up at the ceiling and turning head quickly   Relieving factors:  Weather    Progression of  symptoms: better   OCULOMOTOR EXAM:   Ocular Alignment: normal   Ocular ROM: No Limitations   Spontaneous Nystagmus: absent   Gaze-Induced Nystagmus: absent   Smooth Pursuits: intact and when pt going to the L, felt like he was about going to start having vertigo   Saccades: intact and initially having difficulty with going to the L       VESTIBULAR - OCULAR REFLEX:    Slow VOR: Normal, dizziness when going to the L   VOR Cancellation: Normal,  dizziness when going to the L   Head-Impulse Test: HIT Right: negative HIT Left: positive, pt with dizziness    POSITIONAL TESTING: Right Dix-Hallpike: no nystagmus Left Dix-Hallpike: upbeating, left nystagmus and lasting approx 10 seconds     VESTIBULAR TREATMENT:  Canalith Repositioning: Epley Left: Number of Reps: 1, Response to Treatment: symptoms worsened/converted, and Comment: After 1 rep of Epley maneuver, pt reporting dizziness in only position 1. When going to re-assess, pt with extreme dizziness and intense geotropic nystagmus, indicating conversion to horizontal canal. Immediately sat pt up. Provided pt with ginger ale. Pt requesting to not re-check BPPV or treat horizontal canal today.      PATIENT EDUCATION:  Education details: Vestibular assessment findings, BPPV education and etiology, educated that L posterior canal BPPV converted to horizontal canal - discussed performing repeated rolling at home before next appt on Friday.    Person educated: Patient Education method: Explanation, Demonstration, and Verbal cues Education comprehension: verbalized understanding and needs further education  HOME EXERCISE PROGRAM: To be provided   ASSESSMENT:  CLINICAL IMPRESSION: Today's skilled session focused on vestibular assessment as pt during eval reporting vertigo with looking up (that started about a month ago). Pt would rather focus on getting rid of his vertigo first before focusing on his back pain. Performed a vestibular  assessment with pt demonstrating L upbeating rotary nystagmus lasting approx. 10 seconds, indicating L posterior canal BPPV. Treated x1 rep of the Epley maneuver with pt only reporting dizziness in position 1. With re-assessment, pt demonstrating conversion of BPPV to horizontal canal with pt having geotropic nystagmus. Sat pt immediately up and provided pt with ginger ale. Did not get to re-assess or treat due to conversion and pt's symptoms. Educated pt to work on repeated rolling at home before next session to hopefully clear horizontal canal BPPV. Will continue per POC.  OBJECTIVE IMPAIRMENTS: Abnormal gait, decreased balance, decreased ROM, decreased strength, dizziness, and pain.   ACTIVITY LIMITATIONS: carrying, bending, sitting, standing, and locomotion level  PARTICIPATION LIMITATIONS: meal prep, laundry, community activity, occupation, and yard work  PERSONAL FACTORS: Age, Profession, Time since onset of injury/illness/exacerbation, and 1 comorbidity: see above  are also affecting patient's functional outcome.   REHAB POTENTIAL: Good  CLINICAL DECISION MAKING: Stable/uncomplicated  EVALUATION COMPLEXITY: Low   GOALS: Goals reviewed with patient? Yes  SHORT TERM GOALS:  Target date: 10/21/2023  Patient will report demonstrate independence with initial HEP in order to maintain current gains and continue to progress after physical therapy discharge.   Baseline: To be assessed  Goal status: INITIAL  2.  Assess for vestibular goal and write LTG as indicated Baseline: LTG written.  Goal status: MET   LONG TERM GOALS: Target date: 11/18/2023  Patient will report demonstrate independence with final HEP in order to maintain current gains and continue to progress after physical therapy discharge.   Baseline: To be provided  Goal status: INITIAL  2.  Patient will improve modified ODI score to 15% impairment indicate a clinically important improvement in low back pain.   Baseline:  28% impairment Goal status: INITIAL  3.  Patient will improve NDI score to 12% indicate a clinically important improvement in neck pain.   Baseline: 22% impairment Goal status: INITIAL  4.  Pt will demo resolution of L posterior canal BPPV  Baseline: L posterior canal BPPV that converted to horizontal canal  Goal status: INITIAL  PLAN:  PT FREQUENCY: 1-2x/week (2x week for first 3 weeks and dropping down to 1x week for 2 additional weeks)  PT DURATION: 5 weeks   PLANNED INTERVENTIONS: 97164- PT Re-evaluation, 97110-Therapeutic exercises, 97530- Therapeutic activity, 97112- Neuromuscular re-education, 97535- Self Care, [id number]- Manual therapy, [id number]- Gait training, [phone]- Orthotic Fit/training, [phone]- Canalith repositioning, and Vestibular training.  PLAN FOR NEXT SESSION: re-assess BPPV (was L posterior canal and converted to horizontal canal)  assess cervical spine, monitor BP, HEP for thoracic and cervical impairments (note hypersensitive to thoracic back)   Sheffield LOISE Senate, PT, DPT 10/03/2023, 9:09 AM

## 2023-10-04 ENCOUNTER — Ambulatory Visit (AMBULATORY_SURGERY_CENTER): Payer: BC Managed Care – PPO | Admitting: *Deleted

## 2023-10-04 VITALS — Ht 69.0 in | Wt 232.0 lb

## 2023-10-04 DIAGNOSIS — Z1211 Encounter for screening for malignant neoplasm of colon: Secondary | ICD-10-CM

## 2023-10-04 MED ORDER — NA SULFATE-K SULFATE-MG SULF 17.5-3.13-1.6 GM/177ML PO SOLN: 1.0000 | Freq: Once | ORAL | 0 refills | Status: AC

## 2023-10-04 NOTE — Progress Notes (Signed)
 Pt's name and DOB verified at the beginning of the pre-visit wit 2 identifiers  Pt denies any difficulty with ambulating,sitting, laying down or rolling side to side  Pt has no issues with ambulation   Pt has no issues moving head neck or swallowing  No egg or soy allergy known to patient   No issues known to pt with past sedation with any surgeries or procedures  No FH of Malignant Hyperthermia  Pt is not on diet pills or shots  Pt is not on home 02   Pt is not on blood thinners  Pt has frequent issues with constipation RN instructed pt to use Miralax  per bottles instructions a week before prep days. Pt states they will  Pt is not on dialysis  Pt denise any abnormal heart rhythms   Pt denies any upcoming cardiac testing  Pt encouraged to use to use Singlecare or Goodrx to reduce cost   Patient's chart reviewed by Norleen Schillings CNRA prior to pre-visit and patient appropriate for the [facility].  Pre-visit completed and red dot placed by patient's name on their procedure day (on provider's schedule).  .  Visit by phone  Pt states weight is 232 lb  Instructed pt why it is important to and  to call if they have any changes in health or new medications. Directed them to the # given and on instructions.     Instructions reviewed. Pt given both [facility] main # and MD on call # prior to instructions.  Pt states understanding. Instructed to review again prior to procedure. Pt states they will.   Instructions sent by mail with coupon and by My Chart  Coupon sent via text to mobile phone and pt verified they received it Pt developed Vertigo within last month. Put as fall risk

## 2023-10-06 ENCOUNTER — Other Ambulatory Visit: Payer: Self-pay | Admitting: Internal Medicine

## 2023-10-07 ENCOUNTER — Ambulatory Visit: Payer: 59 | Admitting: Physical Therapy

## 2023-10-10 ENCOUNTER — Ambulatory Visit: Payer: 59 | Admitting: Physical Therapy

## 2023-10-10 ENCOUNTER — Encounter: Payer: Self-pay | Admitting: Physical Therapy

## 2023-10-10 VITALS — BP 142/99 | HR 80

## 2023-10-10 DIAGNOSIS — M546 Pain in thoracic spine: Secondary | ICD-10-CM

## 2023-10-10 DIAGNOSIS — R42 Dizziness and giddiness: Secondary | ICD-10-CM

## 2023-10-10 DIAGNOSIS — M5459 Other low back pain: Secondary | ICD-10-CM

## 2023-10-10 NOTE — Therapy (Signed)
 OUTPATIENT PHYSICAL THERAPY THORACOLUMBAR TREATMENT   Patient Name: Taylor Schroeder MRN: [id number] DOB:[birth year], 58 y.o., male Today's Date: 10/10/2023  END OF SESSION:  PT End of Session - 10/10/23 0720     Visit Number 3    Number of Visits 9    Date for PT Re-Evaluation 11/18/23    Authorization Type [employer] / [place]    PT Start Time 0719    PT Stop Time 0759    PT Time Calculation (min) 40 min    Activity Tolerance Patient tolerated treatment well    Behavior During Therapy [facility] for tasks assessed/performed             Past Medical History:  Diagnosis Date   Allergy    Arthritis    qwhere (02/03/2017)   BPH (benign prostatic hyperplasia)    Childhood asthma    as a baby   Coronary artery disease Non-obstructive   a. nonobs cath in 2006;  b. 01/2013 Cath: LM nl, LAD 53m, LCX nl, RCA non-dom, nl. EF 60-65%.   COVID    Esophageal ring, acquired 07/12/2013   GERD (gastroesophageal reflux disease)    H/O hiatal hernia    think so (02/06/2014)   Headache    resolved w/BP control in 11/2015 (02/03/2017)   High cholesterol    History of kidney stones    Hypertension    Ileitis 2014   Suspected Crohn's, think not as time has gone on   NAFLD (nonalcoholic fatty liver disease) 93/83/7978   Obesity    Osteoporosis    Pernicious anemia-B12 deficiency 04/18/2013   PONV (postoperative nausea and vomiting) 02/03/2017; 08/2016   Sleep apnea    cpap every night   Past Surgical History:  Procedure Laterality Date   APPENDECTOMY     CARDIAC CATHETERIZATION  01/2013   results were clear   CARPAL TUNNEL RELEASE Right    CHOLECYSTECTOMY N/A 02/03/2017   Procedure: LAPAROSCOPIC CHOLECYSTECTOMY;  Surgeon: Tanda Locus, MD;  Location: [facility] OR;  Service: General;  Laterality: N/A;   COLONOSCOPY  ~ 2014   COLONOSCOPY     ESOPHAGOGASTRODUODENOSCOPY  ~ 2014   INGUINAL HERNIA REPAIR  1966   ? side   KNEE ARTHROPLASTY Left 02/06/2014   Procedure: COMPUTER ASSISTED  TOTAL KNEE ARTHROPLASTY;  Surgeon: Oneil JAYSON Herald, MD;  Location: [facility] OR;  Service: Orthopedics;  Laterality: Left;  Cemented Left Total Knee Arthroplasty   KNEE ARTHROSCOPY Left 08/2016   scar tissue cleaned out   LAPAROSCOPIC CHOLECYSTECTOMY  02/03/2017   LEFT HEART CATHETERIZATION WITH CORONARY ANGIOGRAM N/A 01/29/2013   Procedure: LEFT HEART CATHETERIZATION WITH CORONARY ANGIOGRAM;  Surgeon: Aleene JINNY Passe, MD;  Location: [facility] CATH LAB;  Service: Cardiovascular;  Laterality: N/A;   MULTIPLE TOOTH EXTRACTIONS  09/2016   REVERSE SHOULDER ARTHROPLASTY Left 07/31/2019   Procedure: LEFT REVERSE SHOULDER ARTHROPLASTY;  Surgeon: Addie Cordella Hamilton, MD;  Location: [facility] OR;  Service: Orthopedics;  Laterality: Left;   SHOULDER ARTHROSCOPY Left 03/12/2013   TOTAL KNEE ARTHROPLASTY Left 02/06/2014   TOTAL KNEE ARTHROPLASTY Right 09/14/2017   TOTAL KNEE ARTHROPLASTY Right 09/14/2017   Procedure: RIGHT TOTAL KNEE ARTHROPLASTY;  Surgeon: Herald Oneil JAYSON, MD;  Location: [facility] OR;  Service: Orthopedics;  Laterality: Right;   Patient Active Problem List   Diagnosis Date Noted   ST (skin tag) 07/12/2023   Flu vaccine need 07/12/2023   Chest pain 12/03/2022   Need for hepatitis C screening test 11/29/2022   Unequal blood pressure in upper  extremities 11/29/2022   Mild intermittent asthma without complication 11/29/2022   Abnormal electrocardiogram 12/08/2021   Chronic pansinusitis 10/16/2020   Encounter for general adult medical examination with abnormal findings 08/20/2020   Intrinsic eczema 08/20/2020   Coital headache 05/19/2020   NAFLD (nonalcoholic fatty liver disease) 93/83/7978   OA (osteoarthritis) of shoulder 07/31/2019   Allergic rhinoconjunctivitis 01/03/2019   Seasonal allergic rhinitis due to pollen 04/11/2018   Vitamin D  deficiency 12/21/2017   Primary osteoarthritis of left shoulder 06/13/2017   Sleep apnea 03/08/2017   Hyperlipidemia with target LDL less than 100 12/22/2016   Primary erectile  dysfunction 11/18/2014   Essential hypertension 07/15/2014   DDD (degenerative disc disease), cervical 10/03/2013   Esophageal ring, acquired 07/12/2013   BPH (benign prostatic hyperplasia) 04/18/2013   Pernicious anemia-B12 deficiency 04/18/2013   Severe obesity (BMI >= 40) (HCC) 12/21/2012   GERD (gastroesophageal reflux disease) 12/21/2012   Hyperglycemia 08/29/2012    PCP: Joshua Debby CROME, MD  REFERRING PROVIDER: Patel, Donika K, DO  REFERRING DIAG: R20.2 (ICD-10-CM) - Paresthesias R29.2 (ICD-10-CM) - Hyperreflexia  Rationale for Evaluation and Treatment: Rehabilitation  THERAPY DIAG:  Dizziness and giddiness  Other low back pain  Thoracic back pain, unspecified back pain laterality, unspecified chronicity  ONSET DATE: 09/15/2023 (referral)   SUBJECTIVE:                                                                                                                                                                                           SUBJECTIVE STATEMENT: Reports last Tuesday vertigo was horrible - was bending over to put on his shoes and got really sick. Had lots of episodes of emesis. Reports felt good over the weekend, had no vertigo or anything. Haven't taken Meclizine  in 2 days.   PERTINENT HISTORY:  Disc protrusion in thoracic spine and cervical spinal stenosis, vertigo  PAIN:  Are you having pain? Yes: NPRS scale: reports neck is really stiff from the cold weather  Pain location: center of back from shoulders blades down Pain description: annoying  Aggravating factors: driving, being in one position for a long time Relieving factors: working   Vitals:   10/10/23 0725  BP: (!) 142/99  Pulse: 80     PRECAUTIONS: Fall  RED FLAGS: None   WEIGHT BEARING RESTRICTIONS: Yes not supposed to lift over 15lbs since shoulder surgery  FALLS:  Has patient fallen in last 6 months? No  LIVING ENVIRONMENT: Lives with: lives with their spouse Lives in:  House/apartment Stairs: Yes: External: 3 steps; can reach both Has following equipment at home: None  OCCUPATION: HVAC residential work  PLOF: Independent  PATIENT GOALS: I wish my back would quit hurting and I could sleep sometime.  NEXT MD VISIT: not one scheduled that I know of  OBJECTIVE:  Note: Objective measures were completed at Evaluation unless otherwise noted.  DIAGNOSTIC FINDINGS:  IMPRESSION: 1. No acute intracranial abnormality. 2. Small amount of scattered foci of T2 hyperintensity within the white matter of the cerebral hemispheres, nonspecific but may represent early chronic microvascular ischemic changes. 3. Bilateral mastoid effusion, right greater than left, similar to prior MRI.  IMPRESSION: 1. Paracentral disc protrusions bilaterally at T3-4 with mass effect on both sides of the thecal sac. 2. Shallow left paracentral disc protrusion at T4-5 with mild mass effect on the left side of thecal sac. 3. Central and left paracentral disc protrusion at T5-6 with mild mass effect on the thecal sac. 4. Small central disc protrusion at T8-9 with mild mass effect on the ventral thecal sac. 5. No cord lesions are identified.  IMPRESSION: 1. Advanced degenerative cervical spondylosis as detailed above. 2. Mild to moderate spinal stenosis at C5-6. 3. Right paracentral and foraminal disc protrusion at C5-6 with significant right foraminal stenosis. Moderate left foraminal stenosis also noted. 4. Significant left foraminal stenosis at C3-4 and mild to moderate right foraminal stenosis. 5. Mild/moderate bilateral foraminal stenosis at C4-5. 6. Mild left foraminal stenosis at C6-7.   PATIENT SURVEYS:  Modified Oswestry 14/50 = 28% Impairment  NDI 11/50 = 22% impairment  COGNITION: Overall cognitive status: Within functional limits for tasks assessed     SENSATION: Reports numbness and tingling in first 4 fingers since shoulder surgery  POSTURE: No  Significant postural limitations  PALPATION: Hypersensitive to light palpation on mid back around T8-T10  LUMBAR ROM:   AROM eval  Flexion WFL with tightness in hamstring  Extension 50% - pain in mid back, increases with repeated motions  Right lateral flexion 70% - sore  Left lateral flexion 70% - sore  Right rotation WFL  Left rotation WFL   (Blank rows = not tested)  LOWER EXTREMITY ROM:     Gross LE tightness, particulary hamstrings and gastrocs on   LOWER EXTREMITY MMT:    MMT Right eval Left eval  Hip flexion 5/5 5/5  Hip extension    Hip abduction 4/5 4/5  Hip adduction 4/5 4/5  Hip internal rotation    Hip external rotation    Knee flexion 4+/5 4+/5  Knee extension 4+/5 4+/5  Ankle dorsiflexion 5/5 5/5  Ankle plantarflexion    Ankle inversion    Ankle eversion     (Blank rows = not tested)  LUMBAR SPECIAL TESTS:  See repeated motions above  FUNCTIONAL TESTS:  Held due to elevated BP readings in today's session   GAIT: Distance walked: antalgic - to be assessed further in future sessions Assistive device utilized: None Level of assistance: Modified independence    TREATMENT:  VESTIBULAR ASSESSMENT   GENERAL OBSERVATION: Pt ambulates in independently with no AD.     SYMPTOM BEHAVIOR:   Subjective history: Pt reports vertigo for over a month    Non-Vestibular symptoms: tinnitus and nausea/vomiting   Type of dizziness: Swimmyheaded   Frequency: Daily   Duration: Varies, 10 seconds to about a minute    Aggravating factors: Induced by position change: lying supine, supine to sit, and laying on his L side and looking up  and Induced by motion: looking up at the ceiling and turning head quickly   Relieving factors:  Weather    Progression of symptoms: better   OCULOMOTOR EXAM:   Ocular Alignment: normal   Ocular ROM: No  Limitations   Spontaneous Nystagmus: absent   Gaze-Induced Nystagmus: absent   Smooth Pursuits: intact and when pt going to the L, felt like he was about going to start having vertigo   Saccades: intact and initially having difficulty with going to the L       VESTIBULAR - OCULAR REFLEX:    Slow VOR: Normal, dizziness when going to the L   VOR Cancellation: Normal,  dizziness when going to the L   Head-Impulse Test: HIT Right: negative HIT Left: positive, pt with dizziness      TREATMENT:  Therapeutic Activity  Vitals:   10/10/23 0725  BP: (!) 142/99  Pulse: 80    POSITIONAL TESTING: Right Dix-Hallpike: no nystagmus Left Dix-Hallpike: no nystagmus  No dizziness with testing.   Discussed resolution with BPPV at this time. Discussed reoccurrence - will re-assess during bout of therapy if it returns, otherwise in future, pt will need a new referral to return to PT.   Therapeutic Exercise:  Seated cat/cow 10 reps Seated trunk rotation looking over shoulder 2 x 30 seconds, pt reporting feeling good with this one  Lower trunk rotation with a 30 second hold, performed 3 reps, pt reporting performing this at home  Seated thoracic extension over chair 10 reps Modified quadruped trunk rotation 10 reps each side, pt more limited with L side Lumbar flexion stretch at countertop 10 reps   PATIENT EDUCATION:  Education details: Discussed resolution of BPPV at this time and answered pt's questions in regards to HEP, initial HEP for low back/thoracic mobility  Person educated: Patient Education method: Explanation, Demonstration, Verbal cues, and Handouts Education comprehension: verbalized understanding and needs further education  HOME EXERCISE PROGRAM: Access Code: [email or web address] URL: [email or web address] Date: 10/10/2023 Prepared by: Sheffield Senate  Exercises - Seated Cat Cow  - 2 x daily - 7 x weekly - 1 sets - 10 reps - Seated Trunk Rotation Stretch  - 2 x daily  - 7 x weekly - 1-2 sets - 10 reps - Seated Thoracic Lumbar Extension with Pectoralis Stretch  - 1 x daily - 7 x weekly - 1-2 sets - 10 reps - Thoracic Rotation at Counter  - 1 x daily - 7 x weekly - 1-2 sets - 10 reps - Standing Lumbar Spine Flexion Stretch Counter  - 1 x daily - 7 x weekly - 1-2 sets - 10 reps  ASSESSMENT:  CLINICAL IMPRESSION: Pt's BP elevated, but still barely WNL to participate in PT. Pt reporting he took his BP meds prior to therapy. Re-assessed BPPV in R and L sidelying with pt having no dizziness/nystagmus, indicating resolution of BPPV. Pt also reports no dizziness in the past couple of days. In previous session, pt's L posterior canal BPPV converted to horizontal canal, but no  nystagmus noted today. Remainder of session focused on initiating HEP for low back/thoracic mobility. Pt reporting stiffness with exercises, but able to tolerate well. Did report most relief with stiffness with seated trunk rotation. Pt more stiff going to the L side. Will continue per POC.   OBJECTIVE IMPAIRMENTS: Abnormal gait, decreased balance, decreased ROM, decreased strength, dizziness, and pain.   ACTIVITY LIMITATIONS: carrying, bending, sitting, standing, and locomotion level  PARTICIPATION LIMITATIONS: meal prep, laundry, community activity, occupation, and yard work  PERSONAL FACTORS: Age, Profession, Time since onset of injury/illness/exacerbation, and 1 comorbidity: see above  are also affecting patient's functional outcome.   REHAB POTENTIAL: Good  CLINICAL DECISION MAKING: Stable/uncomplicated  EVALUATION COMPLEXITY: Low   GOALS: Goals reviewed with patient? Yes  SHORT TERM GOALS: Target date: 10/21/2023  Patient will report demonstrate independence with initial HEP in order to maintain current gains and continue to progress after physical therapy discharge.   Baseline: To be assessed  Goal status: INITIAL  2.  Assess for vestibular goal and write LTG as  indicated Baseline: LTG written.  Goal status: MET   LONG TERM GOALS: Target date: 11/18/2023  Patient will report demonstrate independence with final HEP in order to maintain current gains and continue to progress after physical therapy discharge.   Baseline: To be provided  Goal status: INITIAL  2.  Patient will improve modified ODI score to 15% impairment indicate a clinically important improvement in low back pain.   Baseline: 28% impairment Goal status: INITIAL  3.  Patient will improve NDI score to 12% indicate a clinically important improvement in neck pain.   Baseline: 22% impairment Goal status: INITIAL  4.  Pt will demo resolution of L posterior canal BPPV  Baseline: L posterior canal BPPV that converted to horizontal canal  Goal status: INITIAL  PLAN:  PT FREQUENCY: 1-2x/week (2x week for first 3 weeks and dropping down to 1x week for 2 additional weeks)  PT DURATION: 5 weeks   PLANNED INTERVENTIONS: 97164- PT Re-evaluation, 97110-Therapeutic exercises, 97530- Therapeutic activity, 97112- Neuromuscular re-education, 97535- Self Care, [id number]- Manual therapy, [id number]- Gait training, [phone]- Orthotic Fit/training, [phone]- Canalith repositioning, and Vestibular training.  PLAN FOR NEXT SESSION: CHECK BPPV AS NEEDED.   assess cervical spine, monitor BP, work on more exercises for low back/thoracic mobility   Sheffield LOISE Senate, PT, DPT 10/10/2023, 8:00 AM

## 2023-10-14 ENCOUNTER — Ambulatory Visit: Payer: 59 | Admitting: Physical Therapy

## 2023-10-17 ENCOUNTER — Ambulatory Visit: Payer: 59 | Admitting: Physical Therapy

## 2023-10-21 ENCOUNTER — Ambulatory Visit: Payer: 59 | Admitting: Physical Therapy

## 2023-10-21 ENCOUNTER — Telehealth: Payer: Self-pay | Admitting: Physical Therapy

## 2023-10-21 ENCOUNTER — Encounter: Payer: Self-pay | Admitting: Physical Therapy

## 2023-10-21 NOTE — Telephone Encounter (Signed)
Physical therapist called patient on phone as this is patient 3rd same day cancel and no show. Discussed policy and recommend D/C at this time. Patient in agreement. See D/C notes for details.  Maryruth Eve, PT, DPT

## 2023-10-21 NOTE — Therapy (Signed)
Called patient and discussed 3 day same cancels that patient had thus far. Patient explained he is in a busy time at work. PT discussed D/C per policy but explained to patient could always get a new referral and come back when he was able to have more consistency in treatments. Patient verbalizes understanding and is in agreement.  PHYSICAL THERAPY DISCHARGE SUMMARY  Visits from Start of Care: 3  Current functional level related to goals / functional outcomes: See prior sessions but unable to enter as not returned    Remaining deficits: Unable to assess   Education / Equipment: See above   Patient agrees to discharge. Patient goals were not met. Patient is being discharged due to not returning since the last visit.

## 2023-10-24 ENCOUNTER — Ambulatory Visit: Payer: 59 | Admitting: Physical Therapy

## 2023-10-24 ENCOUNTER — Encounter: Payer: Self-pay | Admitting: Internal Medicine

## 2023-10-28 ENCOUNTER — Encounter: Payer: BC Managed Care – PPO | Admitting: Internal Medicine

## 2023-10-31 ENCOUNTER — Ambulatory Visit: Payer: 59 | Admitting: Physical Therapy

## 2023-11-26 ENCOUNTER — Other Ambulatory Visit: Payer: Self-pay | Admitting: Internal Medicine

## 2023-11-26 DIAGNOSIS — I10 Essential (primary) hypertension: Secondary | ICD-10-CM

## 2023-12-05 ENCOUNTER — Telehealth: Payer: Self-pay | Admitting: Internal Medicine

## 2023-12-05 ENCOUNTER — Encounter: Payer: BC Managed Care – PPO | Admitting: Internal Medicine

## 2023-12-05 NOTE — Telephone Encounter (Signed)
 Returned pts call.  He has been sick all weekend with head congestion.  He feels like he has another sinus infection and would like to cancel today's procedure.  He feels that he needs to make an appointment with his primary before rescheduling since this has been a reoccurring problem.

## 2023-12-05 NOTE — Telephone Encounter (Signed)
 Patient called stated he has a sinus infection not sure if he can proceed with procedure scheduled for today.

## 2023-12-22 ENCOUNTER — Other Ambulatory Visit: Payer: Self-pay | Admitting: Internal Medicine

## 2024-02-25 ENCOUNTER — Other Ambulatory Visit: Payer: Self-pay | Admitting: Internal Medicine

## 2024-02-25 DIAGNOSIS — I10 Essential (primary) hypertension: Secondary | ICD-10-CM

## 2024-03-01 ENCOUNTER — Other Ambulatory Visit: Payer: Self-pay | Admitting: Internal Medicine

## 2024-03-01 DIAGNOSIS — L2084 Intrinsic (allergic) eczema: Secondary | ICD-10-CM

## 2024-03-01 NOTE — Telephone Encounter (Unsigned)
 Copied from CRM [id number]. Topic: Clinical - Medication Refill >> Mar 01, 2024 10:50 AM Adonis Hoot wrote: Medication: fluocinonide  cream (LIDEX ) 0.05 %  Has the patient contacted their pharmacy? Yes (Agent: If no, request that the patient contact the pharmacy for the refill. If patient does not wish to contact the pharmacy document the reason why and proceed with request.) (Agent: If yes, when and what did the pharmacy advise?)  This is the patient's preferred pharmacy:  [id number] - [place], [place] - [address] AT [address] OF [facility] [address] [place] [address] Phone: [phone] Fax: [phone]    Is this the correct pharmacy for this prescription? Yes If no, delete pharmacy and type the correct one.   Has the prescription been filled recently? No  Is the patient out of the medication? Yes  Has the patient been seen for an appointment in the last year OR does the patient have an upcoming appointment? Yes  Can we respond through MyChart? Yes  Agent: Please be advised that Rx refills may take up to 3 business days. We ask that you follow-up with your pharmacy.

## 2024-03-14 ENCOUNTER — Other Ambulatory Visit: Payer: Self-pay | Admitting: Internal Medicine

## 2024-03-14 ENCOUNTER — Other Ambulatory Visit: Payer: Self-pay

## 2024-03-14 ENCOUNTER — Telehealth: Payer: Self-pay | Admitting: Internal Medicine

## 2024-03-14 DIAGNOSIS — I10 Essential (primary) hypertension: Secondary | ICD-10-CM

## 2024-03-14 MED ORDER — TELMISARTAN 80 MG PO TABS: 80.0000 mg | ORAL_TABLET | Freq: Every day | ORAL | 0 refills | Status: DC

## 2024-03-14 NOTE — Telephone Encounter (Signed)
 Medication sent in.

## 2024-03-14 NOTE — Telephone Encounter (Signed)
 Copied from CRM [id number]. Topic: Clinical - Medication Question >> Mar 14, 2024  9:34 AM [place] A wrote: Reason for CRM: Patient has five days of telmisartan  (MICARDIS ) 80 MG tablet and wants to know can it be refilled? Patient has scheduled appt -please contact patient

## 2024-03-20 ENCOUNTER — Other Ambulatory Visit: Payer: Self-pay | Admitting: Internal Medicine

## 2024-03-20 ENCOUNTER — Other Ambulatory Visit (INDEPENDENT_AMBULATORY_CARE_PROVIDER_SITE_OTHER): Payer: Self-pay

## 2024-03-20 ENCOUNTER — Ambulatory Visit: Admitting: Orthopaedic Surgery

## 2024-03-20 DIAGNOSIS — M25531 Pain in right wrist: Secondary | ICD-10-CM

## 2024-03-20 MED ORDER — PREDNISONE 10 MG (21) PO TBPK: ORAL_TABLET | ORAL | 3 refills | Status: DC

## 2024-03-20 NOTE — Progress Notes (Signed)
 Office Visit Note   Patient: Taylor Schroeder           Date of Birth: 05-Mar-1966           MRN: [id number] Visit Date: 03/20/2024              Requested by: Joshua Debby CROME, MD [address] [place],  [place] [address] PCP: Joshua Debby CROME, MD   Assessment & Plan: Visit Diagnoses:  1. Pain in right wrist     Plan: History of Present Illness Taylor Schroeder is a 58 year old male who presents with ulnar sided right wrist pain.  He experiences constant pain on the ulnar side of the right wrist for about a month, with no preceding injury. The pain worsens with activity, and there is no numbness or tingling. A knot on the wrist fluctuates in size, sometimes enlarging and then reducing. Attempts to use a wrap and a slide-on brace cause discomfort due to physical touch. Resting the wrist on the console while driving also causes irritation. He works in [employer], involving significant manual labor, which complicates resting the wrist. He has a history of joint replacements, including both knees and the left shoulder, and notes that his right shoulder also requires replacement.  Currently his work is very busy.  Physical Exam MUSCULOSKELETAL: Swelling over the ulnar side of the right wrist that is tender to palpation.  No signs of infection.  Wrist range of motion is functional.  Assessment and Plan Right wrist tenosynovitis Chronic ulnar wrist pain with swelling and tenderness, likely due to overuse. Possible ganglion cyst. Emphasized reducing repetitive use and rest. - Prescribed prednisone  for inflammation. - Provided larger wrist brace for immobilization. - Advised wrist immobilization during evenings and mornings.  Follow-Up Instructions: No follow-ups on file.   Orders:  Orders Placed This Encounter  Procedures   XR Wrist Complete Right   Meds ordered this encounter  Medications   predniSONE  (STERAPRED UNI-PAK 21 TAB) 10 MG (21) TBPK tablet    Sig: Take as directed     Dispense:  21 tablet    Refill:  3     Subjective: Chief Complaint  Patient presents with   Right Wrist - Pain    HPI  Review of Systems  Constitutional: Negative.   HENT: Negative.    Eyes: Negative.   Respiratory: Negative.    Cardiovascular: Negative.   Gastrointestinal: Negative.   Endocrine: Negative.   Genitourinary: Negative.   Skin: Negative.   Allergic/Immunologic: Negative.   Neurological: Negative.   Hematological: Negative.   Psychiatric/Behavioral: Negative.    All other systems reviewed and are negative.    Objective: Vital Signs: There were no vitals taken for this visit.  Physical Exam Vitals and nursing note reviewed.  Constitutional:      Appearance: He is well-developed.  HENT:     Head: Normocephalic and atraumatic.   Eyes:     Pupils: Pupils are equal, round, and reactive to light.   Pulmonary:     Effort: Pulmonary effort is normal.  Abdominal:     Palpations: Abdomen is soft.   Musculoskeletal:        General: Normal range of motion.     Cervical back: Neck supple.   Skin:    General: Skin is warm.   Neurological:     Mental Status: He is alert and oriented to person, place, and time.   Psychiatric:        Behavior:  Behavior normal.        Thought Content: Thought content normal.        Judgment: Judgment normal.     Ortho Exam  Specialty Comments:  No specialty comments available.  Imaging: XR Wrist Complete Right Result Date: 03/20/2024 X-rays of the right wrist showed no acute abnormalities.  Diffuse mild degenerative changes.    PMFS History: Patient Active Problem List   Diagnosis Date Noted   ST (skin tag) 07/12/2023   Flu vaccine need 07/12/2023   Chest pain 12/03/2022   Need for hepatitis C screening test 11/29/2022   Unequal blood pressure in upper extremities 11/29/2022   Mild intermittent asthma without complication 11/29/2022   Abnormal electrocardiogram 12/08/2021   Chronic pansinusitis  10/16/2020   Encounter for general adult medical examination with abnormal findings 08/20/2020   Intrinsic eczema 08/20/2020   Coital headache 05/19/2020   NAFLD (nonalcoholic fatty liver disease) 93/83/7978   OA (osteoarthritis) of shoulder 07/31/2019   Allergic rhinoconjunctivitis 01/03/2019   Seasonal allergic rhinitis due to pollen 04/11/2018   Vitamin D  deficiency 12/21/2017   Primary osteoarthritis of left shoulder 06/13/2017   Sleep apnea 03/08/2017   Hyperlipidemia with target LDL less than 100 12/22/2016   Primary erectile dysfunction 11/18/2014   Essential hypertension 07/15/2014   DDD (degenerative disc disease), cervical 10/03/2013   Esophageal ring, acquired 07/12/2013   BPH (benign prostatic hyperplasia) 04/18/2013   Pernicious anemia-B12 deficiency 04/18/2013   Severe obesity (BMI >= 40) (HCC) 12/21/2012   GERD (gastroesophageal reflux disease) 12/21/2012   Hyperglycemia 08/29/2012   Past Medical History:  Diagnosis Date   Allergy    Arthritis    qwhere (02/03/2017)   BPH (benign prostatic hyperplasia)    Childhood asthma    as a baby   Coronary artery disease Non-obstructive   a. nonobs cath in 2006;  b. 01/2013 Cath: LM nl, LAD 18m, LCX nl, RCA non-dom, nl. EF 60-65%.   COVID    Esophageal ring, acquired 07/12/2013   GERD (gastroesophageal reflux disease)    H/O hiatal hernia    think so (02/06/2014)   Headache    resolved w/BP control in 11/2015 (02/03/2017)   High cholesterol    History of kidney stones    Hypertension    Ileitis 2014   Suspected Crohn's, think not as time has gone on   NAFLD (nonalcoholic fatty liver disease) 93/83/7978   Obesity    Osteoporosis    Pernicious anemia-B12 deficiency 04/18/2013   PONV (postoperative nausea and vomiting) 02/03/2017; 08/2016   Sleep apnea    cpap every night    Family History  Problem Relation Age of Onset   Hypertension Mother    Cervical cancer Mother    Cancer Father        type unknown    Emphysema Maternal Grandmother    Emphysema Maternal Grandfather    Other Paternal Grandmother        spinal cancer   COPD Other    Alcohol abuse Neg Hx    Diabetes Neg Hx    Early death Neg Hx    Heart disease Neg Hx    Hyperlipidemia Neg Hx    Kidney disease Neg Hx    Stroke Neg Hx    Colon cancer Neg Hx    Stomach cancer Neg Hx    Esophageal cancer Neg Hx    Rectal cancer Neg Hx    Liver cancer Neg Hx    Colon polyps Neg Hx  Past Surgical History:  Procedure Laterality Date   APPENDECTOMY     CARDIAC CATHETERIZATION  01/2013   results were clear   CARPAL TUNNEL RELEASE Right    CHOLECYSTECTOMY N/A 02/03/2017   Procedure: LAPAROSCOPIC CHOLECYSTECTOMY;  Surgeon: Tanda Locus, MD;  Location: [facility] OR;  Service: General;  Laterality: N/A;   COLONOSCOPY  ~ 2014   COLONOSCOPY     ESOPHAGOGASTRODUODENOSCOPY  ~ 2014   INGUINAL HERNIA REPAIR  1966   ? side   KNEE ARTHROPLASTY Left 02/06/2014   Procedure: COMPUTER ASSISTED TOTAL KNEE ARTHROPLASTY;  Surgeon: Oneil JAYSON Herald, MD;  Location: [facility] OR;  Service: Orthopedics;  Laterality: Left;  Cemented Left Total Knee Arthroplasty   KNEE ARTHROSCOPY Left 08/2016   scar tissue cleaned out   LAPAROSCOPIC CHOLECYSTECTOMY  02/03/2017   LEFT HEART CATHETERIZATION WITH CORONARY ANGIOGRAM N/A 01/29/2013   Procedure: LEFT HEART CATHETERIZATION WITH CORONARY ANGIOGRAM;  Surgeon: Aleene JINNY Passe, MD;  Location: [facility] CATH LAB;  Service: Cardiovascular;  Laterality: N/A;   MULTIPLE TOOTH EXTRACTIONS  09/2016   REVERSE SHOULDER ARTHROPLASTY Left 07/31/2019   Procedure: LEFT REVERSE SHOULDER ARTHROPLASTY;  Surgeon: Addie Cordella Hamilton, MD;  Location: [facility] OR;  Service: Orthopedics;  Laterality: Left;   SHOULDER ARTHROSCOPY Left 03/12/2013   TOTAL KNEE ARTHROPLASTY Left 02/06/2014   TOTAL KNEE ARTHROPLASTY Right 09/14/2017   TOTAL KNEE ARTHROPLASTY Right 09/14/2017   Procedure: RIGHT TOTAL KNEE ARTHROPLASTY;  Surgeon: Herald Oneil JAYSON, MD;  Location: [facility] OR;   Service: Orthopedics;  Laterality: Right;   Social History   Occupational History   Occupation: MAINTENANCE    Employer: [employer]  Tobacco Use   Smoking status: Former    Current packs/day: 0.00    Types: Cigarettes    Quit date: 08/29/1978    Years since quitting: 45.5   Smokeless tobacco: Never  Vaping Use   Vaping status: Never Used  Substance and Sexual Activity   Alcohol use: Yes    Comment: Occasional Drink- 1-2 times per month   Drug use: No   Sexual activity: Yes

## 2024-04-03 ENCOUNTER — Ambulatory Visit: Admitting: Internal Medicine

## 2024-04-19 ENCOUNTER — Encounter: Payer: Self-pay | Admitting: Internal Medicine

## 2024-04-19 ENCOUNTER — Ambulatory Visit: Admitting: Internal Medicine

## 2024-04-19 VITALS — BP 132/82 | HR 96 | Temp 97.9°F | Resp 16 | Ht 69.0 in | Wt 252.8 lb

## 2024-04-19 DIAGNOSIS — I10 Essential (primary) hypertension: Secondary | ICD-10-CM | POA: Diagnosis not present

## 2024-04-19 DIAGNOSIS — N401 Enlarged prostate with lower urinary tract symptoms: Secondary | ICD-10-CM | POA: Diagnosis not present

## 2024-04-19 DIAGNOSIS — L2084 Intrinsic (allergic) eczema: Secondary | ICD-10-CM

## 2024-04-19 DIAGNOSIS — K760 Fatty (change of) liver, not elsewhere classified: Secondary | ICD-10-CM | POA: Diagnosis not present

## 2024-04-19 DIAGNOSIS — K219 Gastro-esophageal reflux disease without esophagitis: Secondary | ICD-10-CM | POA: Diagnosis not present

## 2024-04-19 DIAGNOSIS — Z0000 Encounter for general adult medical examination without abnormal findings: Secondary | ICD-10-CM | POA: Diagnosis not present

## 2024-04-19 DIAGNOSIS — Z0001 Encounter for general adult medical examination with abnormal findings: Secondary | ICD-10-CM

## 2024-04-19 DIAGNOSIS — E785 Hyperlipidemia, unspecified: Secondary | ICD-10-CM

## 2024-04-19 DIAGNOSIS — G4730 Sleep apnea, unspecified: Secondary | ICD-10-CM

## 2024-04-19 LAB — HEMOGLOBIN A1C: Hgb A1c MFr Bld: 5.6 % (ref 4.6–6.5)

## 2024-04-19 LAB — CBC WITH DIFFERENTIAL/PLATELET
Basophils Absolute: 0 K/uL (ref 0.0–0.1)
Basophils Relative: 0.5 % (ref 0.0–3.0)
Eosinophils Absolute: 0.1 K/uL (ref 0.0–0.7)
Eosinophils Relative: 1.9 % (ref 0.0–5.0)
HCT: 42.2 % (ref 39.0–52.0)
Hemoglobin: 14.4 g/dL (ref 13.0–17.0)
Lymphocytes Relative: 36.5 % (ref 12.0–46.0)
Lymphs Abs: 2.7 K/uL (ref 0.7–4.0)
MCHC: 34 g/dL (ref 30.0–36.0)
MCV: 89.4 fl (ref 78.0–100.0)
Monocytes Absolute: 0.7 K/uL (ref 0.1–1.0)
Monocytes Relative: 9.7 % (ref 3.0–12.0)
Neutro Abs: 3.8 K/uL (ref 1.4–7.7)
Neutrophils Relative %: 51.4 % (ref 43.0–77.0)
Platelets: 282 K/uL (ref 150.0–400.0)
RBC: 4.72 Mil/uL (ref 4.22–5.81)
RDW: 13.8 % (ref 11.5–15.5)
WBC: 7.3 K/uL (ref 4.0–10.5)

## 2024-04-19 LAB — URINALYSIS, ROUTINE W REFLEX MICROSCOPIC
Hgb urine dipstick: NEGATIVE
Ketones, ur: NEGATIVE
Leukocytes,Ua: NEGATIVE
Nitrite: NEGATIVE
RBC / HPF: NONE SEEN (ref 0–?)
Specific Gravity, Urine: >=1.03 — AB (ref 1.000–1.030)
Total Protein, Urine: NEGATIVE
Urine Glucose: NEGATIVE
Urobilinogen, UA: 0.2 (ref 0.0–1.0)
pH: 6 (ref 5.0–8.0)

## 2024-04-19 LAB — LIPID PANEL
Cholesterol: 232 mg/dL — ABNORMAL HIGH (ref 0–200)
HDL: 55.4 mg/dL (ref >39.00)
LDL Cholesterol: 139 mg/dL — ABNORMAL HIGH (ref 0–99)
NonHDL: 176.26
Total CHOL/HDL Ratio: 4
Triglycerides: 186 mg/dL — ABNORMAL HIGH (ref 0.0–149.0)
VLDL: 37.2 mg/dL (ref 0.0–40.0)

## 2024-04-19 LAB — BASIC METABOLIC PANEL WITH GFR
BUN: 24 mg/dL — ABNORMAL HIGH (ref 6–23)
CO2: 28 meq/L (ref 19–32)
Calcium: 9.5 mg/dL (ref 8.4–10.5)
Chloride: 105 meq/L (ref 96–112)
Creatinine, Ser: 1.22 mg/dL (ref 0.40–1.50)
GFR: 65.13 mL/min (ref >60.00)
Glucose, Bld: 98 mg/dL (ref 70–99)
Potassium: 4.2 meq/L (ref 3.5–5.1)
Sodium: 140 meq/L (ref 135–145)

## 2024-04-19 LAB — TSH: TSH: 2.27 u[IU]/mL (ref 0.35–5.50)

## 2024-04-19 LAB — PSA: PSA: 2.83 ng/mL (ref 0.10–4.00)

## 2024-04-19 LAB — HEPATIC FUNCTION PANEL
ALT: 22 U/L (ref 0–53)
AST: 19 U/L (ref 0–37)
Albumin: 4.6 g/dL (ref 3.5–5.2)
Alkaline Phosphatase: 66 U/L (ref 39–117)
Bilirubin, Direct: 0.1 mg/dL (ref 0.0–0.3)
Total Bilirubin: 0.6 mg/dL (ref 0.2–1.2)
Total Protein: 7.3 g/dL (ref 6.0–8.3)

## 2024-04-19 MED ORDER — TIRZEPATIDE-WEIGHT MANAGEMENT 2.5 MG/0.5ML ~~LOC~~ SOLN: 2.5000 mg | SUBCUTANEOUS | 0 refills | Status: DC

## 2024-04-19 MED ORDER — PITAVASTATIN CALCIUM 2 MG PO TABS: 2.0000 mg | ORAL_TABLET | Freq: Every day | ORAL | 1 refills | Status: DC

## 2024-04-19 MED ORDER — FLUOCINONIDE 0.05 % EX CREA: 1.0000 | TOPICAL_CREAM | Freq: Two times a day (BID) | CUTANEOUS | 1 refills | Status: AC

## 2024-04-19 NOTE — Progress Notes (Unsigned)
 Subjective:  Patient ID: Taylor Schroeder, male    DOB: October 28, 1965  Age: 58 y.o. MRN: [id number]  CC: Annual Exam and Hypertension   HPI Taylor Schroeder presents for a CPX and f/up ----   Discussed the use of AI scribe software for clinical note transcription with the patient, who gave verbal consent to proceed.  History of Present Illness Taylor Schroeder is a 58 year old male who presents for a follow-up visit.  He experiences episodes of vertigo, particularly when turning his head quickly. These episodes are managed with meclizine . He experiences dizziness related to vertigo but has no ear pain.  He is scheduled for a colonoscopy next month with a GI doctor, having previously canceled two appointments due to unspecified reasons.  He was recently prescribed steroids and a wrist brace by an orthopedic doctor for an inflamed tendon, but he is not currently taking the steroids due to the hot weather.  He has a history of eczema for which he uses a steroid cream, but he is currently out of the medication.  He takes Advil  or ibuprofen  as needed for pain management.  He was previously prescribed tirzepatide  for weight loss related to obesity and sleep apnea, but he has not been using it recently due to scheduling conflicts and cost issues. He lost weight down to 236 pounds when he was using it.  He reports no issues with urination and has not taken uroxatral  in a long time.  He was prescribed Cymbalta  for back spasms related to degenerative disc disease but is not currently taking it, preferring to manage without adding another medication.  He reports knee pain, especially when crawling.  He recently experienced a significant reduction in stress after winning $25,000 through online gambling. No chest pain, shortness of breath, palpitations, heartburn, indigestion, or rash.    Outpatient Medications Prior to Visit  Medication Sig Dispense Refill   [facility] D3 50 MCG (2000 UT) CAPS  TAKE 2 CAPSULES BY MOUTH EVERY DAY 180 capsule 1   esomeprazole  (NEXIUM ) 40 MG capsule TAKE 1 CAPSULE BY MOUTH EVERY MORNING BEFORE BREAKFAST 90 capsule 0   ibuprofen  (ADVIL ) 800 MG tablet Take 800 mg by mouth daily at 6 (six) AM.     meclizine  (ANTIVERT ) 12.5 MG tablet Take 1 tablet (12.5 mg total) by mouth 3 (three) times daily as needed for dizziness. 15 tablet 0   telmisartan  (MICARDIS ) 80 MG tablet Take 1 tablet (80 mg total) by mouth daily. 90 tablet 0   fluocinonide  cream (LIDEX ) 0.05 % Apply 1 application topically 2 (two) times daily. 120 g 1   levocetirizine (XYZAL ) 5 MG tablet Take 2 tablets (10 mg total) by mouth every evening. 180 tablet 0   alfuzosin  (UROXATRAL ) 10 MG 24 hr tablet Take 1 tablet (10 mg total) by mouth daily with breakfast. (Patient not taking: Reported on 10/04/2023) 90 tablet 0   DULoxetine  (CYMBALTA ) 30 MG capsule TAKE 1 CAPSULE BY MOUTH EVERY DAY (Patient not taking: Reported on 10/04/2023) 90 capsule 1   Multiple Vitamin (MULTIVITAMIN) LIQD Take 5 mLs by mouth daily.     predniSONE  (STERAPRED UNI-PAK 21 TAB) 10 MG (21) TBPK tablet Take as directed 21 tablet 3   TIRZEPATIDE  [place] Inject into the skin. (Patient not taking: Reported on 04/19/2024)     No facility-administered medications prior to visit.    ROS Review of Systems  Constitutional:  Positive for fatigue and unexpected weight change (wt gain). Negative for appetite change,  chills and diaphoresis.  HENT: Negative.    Eyes: Negative.  Negative for visual disturbance.  Respiratory:  Positive for apnea. Negative for cough, chest tightness, shortness of breath and wheezing.   Cardiovascular:  Negative for chest pain, palpitations and leg swelling.  Gastrointestinal: Negative.  Negative for abdominal pain, constipation, diarrhea, nausea and vomiting.  Endocrine: Negative.   Genitourinary: Negative.  Negative for difficulty urinating.  Musculoskeletal:  Positive for arthralgias and back pain. Negative for  myalgias.  Skin: Negative.   Neurological: Negative.  Negative for dizziness and weakness.  Hematological:  Negative for adenopathy. Does not bruise/bleed easily.  Psychiatric/Behavioral: Negative.      Objective:  BP 132/82 (BP Location: Left Arm, Patient Position: Sitting, Cuff Size: Normal)   Pulse 96   Temp 97.9 F (36.6 C) (Oral)   Resp 16   Ht 5' 9 (1.753 m)   Wt 252 lb 12.8 oz (114.7 kg)   SpO2 96%   BMI 37.33 kg/m   BP Readings from Last 3 Encounters:  04/19/24 132/82  10/10/23 (!) 142/99  10/03/23 (!) 132/96    Wt Readings from Last 3 Encounters:  04/19/24 252 lb 12.8 oz (114.7 kg)  10/04/23 232 lb (105.2 kg)  09/01/23 237 lb (107.5 kg)    Physical Exam Vitals reviewed.  Constitutional:      General: He is not in acute distress.    Appearance: He is obese. He is not toxic-appearing or diaphoretic.  HENT:     Nose: Nose normal.     Mouth/Throat:     Mouth: Mucous membranes are moist.  Eyes:     General: No scleral icterus.    Conjunctiva/sclera: Conjunctivae normal.  Cardiovascular:     Rate and Rhythm: Normal rate and regular rhythm.     Heart sounds: No murmur heard.    No friction rub. No gallop.  Pulmonary:     Effort: Pulmonary effort is normal.     Breath sounds: No stridor. No wheezing, rhonchi or rales.  Abdominal:     General: Abdomen is protuberant. Bowel sounds are normal. There is no distension.     Palpations: Abdomen is soft. There is no mass.     Tenderness: There is no abdominal tenderness. There is no guarding.     Hernia: No hernia is present.  Musculoskeletal:        General: Normal range of motion.     Cervical back: Neck supple.     Right lower leg: No edema.     Left lower leg: No edema.  Lymphadenopathy:     Cervical: No cervical adenopathy.  Skin:    General: Skin is warm and dry.  Neurological:     General: No focal deficit present.     Mental Status: He is alert. Mental status is at baseline.  Psychiatric:         Mood and Affect: Mood normal.        Behavior: Behavior normal.     Lab Results  Component Value Date   WBC 7.3 04/19/2024   HGB 14.4 04/19/2024   HCT 42.2 04/19/2024   PLT 282.0 04/19/2024   GLUCOSE 89 09/01/2023   CHOL 189 11/29/2022   TRIG 106.0 11/29/2022   HDL 53.90 11/29/2022   LDLDIRECT 134.0 05/03/2019   LDLCALC 114 (H) 11/29/2022   ALT 14 09/01/2023   AST 24 09/01/2023   NA 139 09/01/2023   K 3.9 09/01/2023   CL 111 09/01/2023   CREATININE 0.80 09/01/2023  BUN 18 09/01/2023   CO2 22 09/01/2023   TSH 1.38 06/06/2023   PSA 3.81 07/12/2023   INR 1.0 11/29/2022   HGBA1C 5.6 04/19/2024    MR BRAIN WO CONTRAST Result Date: 09/01/2023 CLINICAL DATA:  Neuro deficit, acute, stroke suspected. EXAM: MRI HEAD WITHOUT CONTRAST TECHNIQUE: Multiplanar, multiecho pulse sequences of the brain and surrounding structures were obtained without intravenous contrast. COMPARISON:  MRI of the brain June 12, 2019. FINDINGS: Brain: No acute infarction, hemorrhage, hydrocephalus, extra-axial collection or mass lesion. Small amount of scattered foci of T2 hyperintensity within the white matter of the cerebral hemispheres, nonspecific. Vascular: Normal flow voids. Skull and upper cervical spine: Normal marrow signal. Sinuses/Orbits: Paranasal sinuses are clear, the orbits are maintained. Bilateral mastoid effusion, right greater than left similar to prior MRI. Other: None. IMPRESSION: 1. No acute intracranial abnormality. 2. Small amount of scattered foci of T2 hyperintensity within the white matter of the cerebral hemispheres, nonspecific but may represent early chronic microvascular ischemic changes. 3. Bilateral mastoid effusion, right greater than left, similar to prior MRI. Electronically Signed   By: Katyucia  de Macedo Rodrigues M.D.   On: 09/01/2023 14:55    Assessment & Plan:  Encounter for general adult medical examination with abnormal findings -     Lipid panel; Future -     PSA;  Future  Intrinsic eczema -     Fluocinonide ; Apply 1 Application topically 2 (two) times daily.  Dispense: 120 g; Refill: 1  Essential hypertension -     Basic metabolic panel with GFR; Future -     Urinalysis, Routine w reflex microscopic; Future -     CBC with Differential/Platelet; Future  Benign prostatic hyperplasia with lower urinary tract symptoms, symptom details unspecified -     PSA; Future  Severe obesity (BMI >= 40) (HCC) -     Hemoglobin A1c; Future -     TSH; Future -     Hepatic function panel; Future -     Tirzepatide -Weight Management; Inject 2.5 mg into the skin once a week.  Dispense: 2 mL; Refill: 0  Sleep apnea, unspecified type -     Tirzepatide -Weight Management; Inject 2.5 mg into the skin once a week.  Dispense: 2 mL; Refill: 0  NAFLD (nonalcoholic fatty liver disease) -     Hepatic function panel; Future -     Tirzepatide -Weight Management; Inject 2.5 mg into the skin once a week.  Dispense: 2 mL; Refill: 0  Hyperlipidemia with target LDL less than 100 -     Hepatic function panel; Future  Gastroesophageal reflux disease without esophagitis -     CBC with Differential/Platelet; Future     Follow-up: Return in about 6 months (around 10/20/2024).  Debby Molt, MD

## 2024-04-19 NOTE — Patient Instructions (Signed)
 Health Maintenance, Male  Adopting a healthy lifestyle and getting preventive care are important in promoting health and wellness. Ask your health care provider about:  The right schedule for you to have regular tests and exams.  Things you can do on your own to prevent diseases and keep yourself healthy.  What should I know about diet, weight, and exercise?  Eat a healthy diet    Eat a diet that includes plenty of vegetables, fruits, low-fat dairy products, and lean protein.  Do not eat a lot of foods that are high in solid fats, added sugars, or sodium.  Maintain a healthy weight  Body mass index (BMI) is a measurement that can be used to identify possible weight problems. It estimates body fat based on height and weight. Your health care provider can help determine your BMI and help you achieve or maintain a healthy weight.  Get regular exercise  Get regular exercise. This is one of the most important things you can do for your health. Most adults should:  Exercise for at least 150 minutes each week. The exercise should increase your heart rate and make you sweat (moderate-intensity exercise).  Do strengthening exercises at least twice a week. This is in addition to the moderate-intensity exercise.  Spend less time sitting. Even light physical activity can be beneficial.  Watch cholesterol and blood lipids  Have your blood tested for lipids and cholesterol at 59 years of age, then have this test every 5 years.  You may need to have your cholesterol levels checked more often if:  Your lipid or cholesterol levels are high.  You are older than 59 years of age.  You are at high risk for heart disease.  What should I know about cancer screening?  Many types of cancers can be detected early and may often be prevented. Depending on your health history and family history, you may need to have cancer screening at various ages. This may include screening for:  Colorectal cancer.  Prostate cancer.  Skin cancer.  Lung  cancer.  What should I know about heart disease, diabetes, and high blood pressure?  Blood pressure and heart disease  High blood pressure causes heart disease and increases the risk of stroke. This is more likely to develop in people who have high blood pressure readings or are overweight.  Talk with your health care provider about your target blood pressure readings.  Have your blood pressure checked:  Every 3-5 years if you are 9-95 years of age.  Every year if you are 85 years old or older.  If you are between the ages of 29 and 29 and are a current or former smoker, ask your health care provider if you should have a one-time screening for abdominal aortic aneurysm (AAA).  Diabetes  Have regular diabetes screenings. This checks your fasting blood sugar level. Have the screening done:  Once every three years after age 23 if you are at a normal weight and have a low risk for diabetes.  More often and at a younger age if you are overweight or have a high risk for diabetes.  What should I know about preventing infection?  Hepatitis B  If you have a higher risk for hepatitis B, you should be screened for this virus. Talk with your health care provider to find out if you are at risk for hepatitis B infection.  Hepatitis C  Blood testing is recommended for:  Everyone born from [birth year] through 1966.  Anyone  with known risk factors for hepatitis C.  Sexually transmitted infections (STIs)  You should be screened each year for STIs, including gonorrhea and chlamydia, if:  You are sexually active and are younger than 59 years of age.  You are older than 59 years of age and your health care provider tells you that you are at risk for this type of infection.  Your sexual activity has changed since you were last screened, and you are at increased risk for chlamydia or gonorrhea. Ask your health care provider if you are at risk.  Ask your health care provider about whether you are at high risk for HIV. Your health care provider  may recommend a prescription medicine to help prevent HIV infection. If you choose to take medicine to prevent HIV, you should first get tested for HIV. You should then be tested every 3 months for as long as you are taking the medicine.  Follow these instructions at home:  Alcohol use  Do not drink alcohol if your health care provider tells you not to drink.  If you drink alcohol:  Limit how much you have to 0-2 drinks a day.  Know how much alcohol is in your drink. In the [place] one drink equals one 12 oz bottle of beer (355 mL), one 5 oz glass of wine (148 mL), or one 1 oz glass of hard liquor (44 mL).  Lifestyle  Do not use any products that contain nicotine or tobacco. These products include cigarettes, chewing tobacco, and vaping devices, such as e-cigarettes. If you need help quitting, ask your health care provider.  Do not use street drugs.  Do not share needles.  Ask your health care provider for help if you need support or information about quitting drugs.  General instructions  Schedule regular health, dental, and eye exams.  Stay current with your vaccines.  Tell your health care provider if:  You often feel depressed.  You have ever been abused or do not feel safe at home.  Summary  Adopting a healthy lifestyle and getting preventive care are important in promoting health and wellness.  Follow your health care provider's instructions about healthy diet, exercising, and getting tested or screened for diseases.  Follow your health care provider's instructions on monitoring your cholesterol and blood pressure.  This information is not intended to replace advice given to you by your health care provider. Make sure you discuss any questions you have with your health care provider.  Document Revised: 02/02/2021 Document Reviewed: 02/02/2021  Elsevier Patient Education  2024 [employer].

## 2024-04-20 ENCOUNTER — Ambulatory Visit: Payer: Self-pay | Admitting: Internal Medicine

## 2024-05-07 ENCOUNTER — Other Ambulatory Visit: Payer: Self-pay | Admitting: Internal Medicine

## 2024-05-07 DIAGNOSIS — K760 Fatty (change of) liver, not elsewhere classified: Secondary | ICD-10-CM

## 2024-05-07 DIAGNOSIS — G4730 Sleep apnea, unspecified: Secondary | ICD-10-CM

## 2024-05-11 ENCOUNTER — Ambulatory Visit: Admitting: Gastroenterology

## 2024-05-11 ENCOUNTER — Encounter: Payer: Self-pay | Admitting: Gastroenterology

## 2024-05-11 VITALS — BP 130/88 | HR 90 | Ht 69.0 in | Wt 251.0 lb

## 2024-05-11 DIAGNOSIS — Z1211 Encounter for screening for malignant neoplasm of colon: Secondary | ICD-10-CM

## 2024-05-11 DIAGNOSIS — K219 Gastro-esophageal reflux disease without esophagitis: Secondary | ICD-10-CM | POA: Diagnosis not present

## 2024-05-11 MED ORDER — NA SULFATE-K SULFATE-MG SULF 17.5-3.13-1.6 GM/177ML PO SOLN: 1.0000 | Freq: Once | ORAL | 0 refills | Status: AC

## 2024-05-11 NOTE — Progress Notes (Signed)
 Taylor Schroeder [id number] 1966-04-09   Chief Complaint: Discuss colonoscopy  Referring Provider: Joshua Debby CROME, MD Primary GI MD: Dr. Avram  HPI: Taylor Schroeder is a 58 y.o. male with past medical history of arthritis, BPH, CAD, esophageal ring, GERD, hiatal hernia, kidney stones, HTN, MASLD, obesity, vitamin B12 deficiency, sleep apnea, appendectomy, cholecystectomy, who presents today for follow-up of GERD and to discuss colonoscopy.     Patient states he is doing well today.  He had previously been scheduled for colonoscopy in January but states he was having a bout of vertigo and had to reschedule.  Ended up having to cancel rescheduled colonoscopy as well due to a sinus infection.  Here today for medication refill of Nexium and would like to also schedule repeat colonoscopy at this time.  GERD symptoms are well-controlled on Nexium, which she states works better for him than Prilosec has in the past.  He is on Zepbound now and has had some nausea with this but denies vomiting or worsening of his reflux symptoms.  He does endorse some constipation on Zepbound, and when he does have a bowel movement it can be loose at times.  Prior to starting Zepbound he was having normal bowel movements so he does attribute this change to the medication.  Denies any rectal bleeding or melena.  Denies abdominal pain.  Previous GI Procedures/Imaging   EGD 09/04/2018 - Benign- appearing esophageal stenosis. Dilated.  - The examination was otherwise normal.  - No specimens collected.  Colonoscopy 2014 - Mild erosion and ileitis found in the terminal ileum, multiple biopsies performed using cold forceps - Otherwise normal and no polyps - Recall 10 years Path: 1. Surgical [P], terminal ileum, bx - CHRONIC ACTIVE ILEITIS, SEE COMMENT. - NEGATIVE FOR DYSPLASIA. 2. Surgical [P], random colon sites, bx - PROMINENT BENIGN LYMPHOID AGGREGATES; OTHERWISE HISTOLOGICALLY NORMAL COLONIC MUCOSA -  NEGATIVE FOR MORPHOLOGIC FEATURES OF IDIOPATHIC INFLAMMATORY BOWEL DISEASE, MICROSCOPIC COLITIS OR INFECTIOUS COLITIS - NEGATIVE FOR DYSPLASIA  Past Medical History:  Diagnosis Date   Allergy    Arthritis    qwhere (02/03/2017)   BPH (benign prostatic hyperplasia)    Childhood asthma    as a baby   Coronary artery disease Non-obstructive   a. nonobs cath in 2006;  b. 01/2013 Cath: LM nl, LAD 51m, LCX nl, RCA non-dom, nl. EF 60-65%.   COVID    Esophageal ring, acquired 07/12/2013   GERD (gastroesophageal reflux disease)    H/O hiatal hernia    think so (02/06/2014)   Headache    resolved w/BP control in 11/2015 (02/03/2017)   High cholesterol    History of kidney stones    Hypertension    Ileitis 2014   Suspected Crohn's, think not as time has gone on   NAFLD (nonalcoholic fatty liver disease) 93/83/7978   Obesity    Osteoporosis    Pernicious anemia-B12 deficiency 04/18/2013   PONV (postoperative nausea and vomiting) 02/03/2017; 08/2016   Sleep apnea    cpap every night   Vertigo     Past Surgical History:  Procedure Laterality Date   APPENDECTOMY     CARDIAC CATHETERIZATION  01/2013   results were clear   CARPAL TUNNEL RELEASE Right    CHOLECYSTECTOMY N/A 02/03/2017   Procedure: LAPAROSCOPIC CHOLECYSTECTOMY;  Surgeon: Tanda Locus, MD;  Location: [facility] OR;  Service: General;  Laterality: N/A;   COLONOSCOPY  ~ 2014   COLONOSCOPY     ESOPHAGOGASTRODUODENOSCOPY  ~ 2014   INGUINAL  HERNIA REPAIR  1966   ? side   KNEE ARTHROPLASTY Left 02/06/2014   Procedure: COMPUTER ASSISTED TOTAL KNEE ARTHROPLASTY;  Surgeon: Oneil JAYSON Herald, MD;  Location: [facility] OR;  Service: Orthopedics;  Laterality: Left;  Cemented Left Total Knee Arthroplasty   KNEE ARTHROSCOPY Left 08/2016   scar tissue cleaned out   LAPAROSCOPIC CHOLECYSTECTOMY  02/03/2017   LEFT HEART CATHETERIZATION WITH CORONARY ANGIOGRAM N/A 01/29/2013   Procedure: LEFT HEART CATHETERIZATION WITH CORONARY ANGIOGRAM;  Surgeon:  Aleene JINNY Passe, MD;  Location: [facility] CATH LAB;  Service: Cardiovascular;  Laterality: N/A;   MULTIPLE TOOTH EXTRACTIONS  09/2016   REVERSE SHOULDER ARTHROPLASTY Left 07/31/2019   Procedure: LEFT REVERSE SHOULDER ARTHROPLASTY;  Surgeon: Addie Cordella Hamilton, MD;  Location: [facility] OR;  Service: Orthopedics;  Laterality: Left;   SHOULDER ARTHROSCOPY Left 03/12/2013   TOTAL KNEE ARTHROPLASTY Left 02/06/2014   TOTAL KNEE ARTHROPLASTY Right 09/14/2017   TOTAL KNEE ARTHROPLASTY Right 09/14/2017   Procedure: RIGHT TOTAL KNEE ARTHROPLASTY;  Surgeon: Herald Oneil JAYSON, MD;  Location: [facility] OR;  Service: Orthopedics;  Laterality: Right;    Current Outpatient Medications  Medication Sig Dispense Refill   [employer]S D3 50 MCG (2000 UT) CAPS TAKE 2 CAPSULES BY MOUTH EVERY DAY 180 capsule 1   esomeprazole (NEXIUM) 40 MG capsule TAKE 1 CAPSULE BY MOUTH EVERY MORNING BEFORE BREAKFAST 90 capsule 0   fluocinonide cream (LIDEX) 0.05 % Apply 1 Application topically 2 (two) times daily. 120 g 1   ibuprofen (ADVIL) 800 MG tablet Take 800 mg by mouth daily at 6 (six) AM.     meclizine (ANTIVERT) 12.5 MG tablet Take 1 tablet (12.5 mg total) by mouth 3 (three) times daily as needed for dizziness. 15 tablet 0   telmisartan (MICARDIS) 80 MG tablet Take 1 tablet (80 mg total) by mouth daily. 90 tablet 0   ZEPBOUND 2.5 MG/0.5ML injection vial INJECT 0.5 ML (2.5 MG) UNDER THE SKIN ONCE WEEKLY (0.5ML= 50 UNITS) 2 mL 0   Pitavastatin Calcium 2 MG TABS Take 1 tablet (2 mg total) by mouth daily. (Patient not taking: Reported on 05/11/2024) 90 tablet 1   No current facility-administered medications for this visit.    Allergies as of 05/11/2024 - Review Complete 05/11/2024  Allergen Reaction Noted   Allegra [fexofenadine hcl] Hives 02/21/2013   Crestor [rosuvastatin] Other (See Comments) 11/29/2022   Hctz [hydrochlorothiazide] Other (See Comments) 05/07/2016   Demerol [meperidine] Nausea And Vomiting 02/21/2013    Family History  Problem  Relation Age of Onset   Hypertension Mother    Cervical cancer Mother    Cancer Father        type unknown   Emphysema Maternal Grandmother    Emphysema Maternal Grandfather    Other Paternal Grandmother        spinal cancer   COPD Other    Alcohol abuse Neg Hx    Diabetes Neg Hx    Early death Neg Hx    Heart disease Neg Hx    Hyperlipidemia Neg Hx    Kidney disease Neg Hx    Stroke Neg Hx    Colon cancer Neg Hx    Stomach cancer Neg Hx    Esophageal cancer Neg Hx    Rectal cancer Neg Hx    Liver cancer Neg Hx    Colon polyps Neg Hx     Social History   Tobacco Use   Smoking status: Former    Current packs/day: 0.00    Types: Cigarettes  Quit date: 08/29/1978    Years since quitting: 45.7   Smokeless tobacco: Never  Vaping Use   Vaping status: Never Used  Substance Use Topics   Alcohol use: Yes    Comment: Occasional Drink- 1-2 times per month   Drug use: No     Review of Systems:    Constitutional: No unintentional weight loss, fever, chills, weakness Cardiovascular: No chest pain Respiratory: No SOB Gastrointestinal: See HPI and otherwise negative Hematologic: No bleeding or bruising    Physical Exam:  Vital signs: BP 130/88 (BP Location: Left Arm, Patient Position: Sitting, Cuff Size: Normal)   Pulse 90   Ht 5' 9 (1.753 m)   Wt 251 lb (113.9 kg)   BMI 37.07 kg/m   Constitutional: Pleasant, obese male in NAD, alert and cooperative Head:  Normocephalic and atraumatic.  Eyes: No scleral icterus.  Respiratory: Respirations even and unlabored. Lungs clear to auscultation bilaterally.  No wheezes, crackles, or rhonchi.  Cardiovascular:  Regular rate and rhythm. No murmurs. No peripheral edema. Gastrointestinal:  Soft, nondistended, some tenderness to palpation of right abdomen which he attributes to muscle pain from work. No rebound or guarding. Normal bowel sounds. No appreciable masses or hepatomegaly. Rectal:  Not performed.  Neurologic:  Alert and  oriented x4;  grossly normal neurologically.  Skin:   Dry and intact without significant lesions or rashes. Psychiatric: Oriented to person, place and time. Demonstrates good judgement and reason without abnormal affect or behaviors.   RELEVANT LABS AND IMAGING: CBC    Component Value Date/Time   WBC 7.3 04/19/2024 1349   RBC 4.72 04/19/2024 1349   HGB 14.4 04/19/2024 1349   HGB 15.9 10/09/2020 0000   HCT 42.2 04/19/2024 1349   HCT 44.1 10/09/2020 0000   PLT 282.0 04/19/2024 1349   PLT 241 10/09/2020 0000   MCV 89.4 04/19/2024 1349   MCV 86 10/09/2020 0000   MCH 29.9 09/01/2023 1009   MCHC 34.0 04/19/2024 1349   RDW 13.8 04/19/2024 1349   RDW 13.1 10/09/2020 0000   LYMPHSABS 2.7 04/19/2024 1349   LYMPHSABS 2.0 10/09/2020 0000   MONOABS 0.7 04/19/2024 1349   EOSABS 0.1 04/19/2024 1349   EOSABS 0.0 10/09/2020 0000   BASOSABS 0.0 04/19/2024 1349   BASOSABS 0.0 10/09/2020 0000    CMP     Component Value Date/Time   NA 140 04/19/2024 1349   NA 135 10/09/2020 0000   K 4.2 04/19/2024 1349   CL 105 04/19/2024 1349   CO2 28 04/19/2024 1349   GLUCOSE 98 04/19/2024 1349   BUN 24 (H) 04/19/2024 1349   BUN 16 10/09/2020 0000   CREATININE 1.22 04/19/2024 1349   CALCIUM 9.5 04/19/2024 1349   PROT 7.3 04/19/2024 1349   ALBUMIN 4.6 04/19/2024 1349   AST 19 04/19/2024 1349   ALT 22 04/19/2024 1349   ALKPHOS 66 04/19/2024 1349   BILITOT 0.6 04/19/2024 1349   GFRNONAA >60 09/01/2023 1009   GFRAA 81 10/09/2020 0000   Echocardiogram 08/15/2023 1. Left ventricular ejection fraction, by estimation, is 60 to 65% . The left ventricle has normal function. The left ventricle has no regional wall motion abnormalities. Left ventricular diastolic parameters were normal. 2. Right ventricular systolic function is normal. The right ventricular size is normal.  3. The mitral valve is normal in structure. No evidence of mitral valve regurgitation. No evidence of mitral stenosis.  4. The aortic  valve is normal in structure. Aortic valve regurgitation is not visualized. No aortic  stenosis is present.  5. The inferior vena cava is normal in size with greater than 50% respiratory variability, suggesting right atrial pressure of 3 mmHg.  Assessment/Plan:   Screening for colon cancer Patient due for repeat colonoscopy.  Last colonoscopy 2014 with finding of mild erosion and ileitis found in the terminal ileum which was nonspecific and thought possibly due to Crohn's disease but no definitive proof. He is doing well.  Has some GI side effects on Zepbound which he states are manageable and worth it to him as the medication helps him with weight loss.  Can have alternating constipation and loose stools but denies any blood in his stool or melena.  Denies abdominal pain.  -Schedule colonoscopy. I thoroughly discussed the procedure with the patient to include nature of the procedure, alternatives, benefits, and risks (including but not limited to bleeding, infection, perforation, anesthesia/cardiac/pulmonary complications). Patient verbalized understanding and gave verbal consent to proceed with procedure.  - Hold Zepbound prior to procedure  GERD Patient with history of GERD which is currently well-controlled on daily Nexium.  He would like to continue this medication.  Has had some belching and nausea on Zepbound but denies worsening of reflux symptoms.  - Continue Nexium 40 mg daily   Camie Furbish, PA-C [facility] 05/11/2024, 2:07 PM  Patient Care Team: Joshua Debby CROME, MD as PCP - General (Internal Medicine)

## 2024-05-11 NOTE — Patient Instructions (Addendum)
 You have been scheduled for a colonoscopy. Please follow written instructions given to you at your visit today.   If you use inhalers (even only as needed), please bring them with you on the day of your procedure.  DO NOT TAKE 7 DAYS PRIOR TO TEST- Trulicity (dulaglutide) Ozempic, Wegovy (semaglutide) Mounjaro (tirzepatide) Bydureon Bcise (exanatide extended release)  DO NOT TAKE 1 DAY PRIOR TO YOUR TEST Rybelsus (semaglutide) Adlyxin (lixisenatide) Victoza (liraglutide) Byetta (exanatide) ___________________________________________________________________________   _______________________________________________________  If your blood pressure at your visit was 140/90 or greater, please contact your primary care physician to follow up on this.  _______________________________________________________  If you are age 59 or older, your body mass index should be between 23-30. Your Body mass index is 37.07 kg/m. If this is out of the aforementioned range listed, please consider follow up with your Primary Care Provider.  If you are age 16 or younger, your body mass index should be between 19-25. Your Body mass index is 37.07 kg/m. If this is out of the aformentioned range listed, please consider follow up with your Primary Care Provider.   ________________________________________________________  The [facility] providers would like to encourage you to use MYCHART to communicate with providers for non-urgent requests or questions.  Due to long hold times on the telephone, sending your provider a message by [facility] may be a faster and more efficient way to get a response.  Please allow 48 business hours for a response.  Please remember that this is for non-urgent requests.  _______________________________________________________  [facility] is using a team-based approach to care.  Your team is made up of your doctor and two to three APPS. Our APPS (Nurse Practitioners and  Physician Assistants) work with your physician to ensure care continuity for you. They are fully qualified to address your health concerns and develop a treatment plan. They communicate directly with your gastroenterologist to care for you. Seeing the Advanced Practice Practitioners on your physician's team can help you by facilitating care more promptly, often allowing for earlier appointments, access to diagnostic testing, procedures, and other specialty referrals.

## 2024-06-10 ENCOUNTER — Other Ambulatory Visit: Payer: Self-pay | Admitting: Internal Medicine

## 2024-06-10 DIAGNOSIS — I10 Essential (primary) hypertension: Secondary | ICD-10-CM

## 2024-06-11 ENCOUNTER — Other Ambulatory Visit: Payer: Self-pay | Admitting: Internal Medicine

## 2024-06-11 DIAGNOSIS — K760 Fatty (change of) liver, not elsewhere classified: Secondary | ICD-10-CM

## 2024-06-11 DIAGNOSIS — G4730 Sleep apnea, unspecified: Secondary | ICD-10-CM

## 2024-06-19 ENCOUNTER — Encounter: Payer: Self-pay | Admitting: Internal Medicine

## 2024-06-20 ENCOUNTER — Other Ambulatory Visit: Payer: Self-pay | Admitting: Internal Medicine

## 2024-06-26 NOTE — Progress Notes (Unsigned)
 [facility] History and Physical   Primary Care Physician:  Joshua Debby CROME, MD   Reason for Procedure:  Colon cancer screening  Plan:    Colonoscopy    HPI: Taylor Schroeder is a 58 y.o. male presenting for a screening colonoscopy.  Last exam 2014 as below.  Colonoscopy 2014 - Mild erosion and ileitis found in the terminal ileum, multiple biopsies performed using cold forceps - Otherwise normal and no polyps - Recall 10 years Path: 1. Surgical [P], terminal ileum, bx - CHRONIC ACTIVE ILEITIS, SEE COMMENT. - NEGATIVE FOR DYSPLASIA. 2. Surgical [P], random colon sites, bx - PROMINENT BENIGN LYMPHOID AGGREGATES; OTHERWISE HISTOLOGICALLY NORMAL COLONIC MUCOSA - NEGATIVE FOR MORPHOLOGIC FEATURES OF IDIOPATHIC INFLAMMATORY BOWEL DISEASE, MICROSCOPIC COLITIS OR INFECTIOUS COLITIS - NEGATIVE FOR DYSPLASIA   Past Medical History:  Diagnosis Date   Allergy    Arthritis    qwhere (02/03/2017)   BPH (benign prostatic hyperplasia)    Childhood asthma    as a baby   Coronary artery disease Non-obstructive   a. nonobs cath in 2006;  b. 01/2013 Cath: LM nl, LAD 58m, LCX nl, RCA non-dom, nl. EF 60-65%.   COVID    Esophageal ring, acquired 07/12/2013   GERD (gastroesophageal reflux disease)    H/O hiatal hernia    think so (02/06/2014)   Headache    resolved w/BP control in 11/2015 (02/03/2017)   High cholesterol    History of kidney stones    Hypertension    Ileitis 2014   Suspected Crohn's, think not as time has gone on   NAFLD (nonalcoholic fatty liver disease) 93/83/7978   Obesity    Osteoporosis    Pernicious anemia-B12 deficiency 04/18/2013   PONV (postoperative nausea and vomiting) 02/03/2017; 08/2016   Sleep apnea    cpap every night   Vertigo     Past Surgical History:  Procedure Laterality Date   APPENDECTOMY     CARDIAC CATHETERIZATION  01/2013   results were clear   CARPAL TUNNEL RELEASE Right    CHOLECYSTECTOMY N/A 02/03/2017   Procedure:  LAPAROSCOPIC CHOLECYSTECTOMY;  Surgeon: Tanda Locus, MD;  Location: [facility] OR;  Service: General;  Laterality: N/A;   COLONOSCOPY  ~ 2014   COLONOSCOPY     ESOPHAGOGASTRODUODENOSCOPY  ~ 2014   INGUINAL HERNIA REPAIR  1966   ? side   KNEE ARTHROPLASTY Left 02/06/2014   Procedure: COMPUTER ASSISTED TOTAL KNEE ARTHROPLASTY;  Surgeon: Oneil JAYSON Herald, MD;  Location: [facility] OR;  Service: Orthopedics;  Laterality: Left;  Cemented Left Total Knee Arthroplasty   KNEE ARTHROSCOPY Left 08/2016   scar tissue cleaned out   LAPAROSCOPIC CHOLECYSTECTOMY  02/03/2017   LEFT HEART CATHETERIZATION WITH CORONARY ANGIOGRAM N/A 01/29/2013   Procedure: LEFT HEART CATHETERIZATION WITH CORONARY ANGIOGRAM;  Surgeon: Aleene JINNY Passe, MD;  Location: [facility] CATH LAB;  Service: Cardiovascular;  Laterality: N/A;   MULTIPLE TOOTH EXTRACTIONS  09/2016   REVERSE SHOULDER ARTHROPLASTY Left 07/31/2019   Procedure: LEFT REVERSE SHOULDER ARTHROPLASTY;  Surgeon: Addie Cordella Hamilton, MD;  Location: [facility] OR;  Service: Orthopedics;  Laterality: Left;   SHOULDER ARTHROSCOPY Left 03/12/2013   TOTAL KNEE ARTHROPLASTY Left 02/06/2014   TOTAL KNEE ARTHROPLASTY Right 09/14/2017   TOTAL KNEE ARTHROPLASTY Right 09/14/2017   Procedure: RIGHT TOTAL KNEE ARTHROPLASTY;  Surgeon: Herald Oneil JAYSON, MD;  Location: [facility] OR;  Service: Orthopedics;  Laterality: Right;     Current Outpatient Medications  Medication Sig Dispense Refill   CVS D3 50 MCG (2000 UT)  CAPS TAKE 2 CAPSULES BY MOUTH EVERY DAY 180 capsule 1   esomeprazole  (NEXIUM ) 40 MG capsule TAKE 1 CAPSULE BY MOUTH EVERY MORNING BEFORE BREAKFAST 90 capsule 3   ibuprofen  (ADVIL ) 800 MG tablet Take 800 mg by mouth daily at 6 (six) AM.     telmisartan  (MICARDIS ) 80 MG tablet TAKE 1 TABLET BY MOUTH EVERY DAY 90 tablet 0   fluocinonide  cream (LIDEX ) 0.05 % Apply 1 Application topically 2 (two) times daily. 120 g 1   meclizine  (ANTIVERT ) 12.5 MG tablet Take 1 tablet (12.5 mg total) by mouth 3 (three) times daily as  needed for dizziness. 15 tablet 0   Pitavastatin  Calcium  2 MG TABS Take 1 tablet (2 mg total) by mouth daily. (Patient not taking: No sig reported) 90 tablet 1   ZEPBOUND  2.5 MG/0.5ML injection vial INJECT 0.5 ML (2.5 MG) UNDER THE SKIN ONCE WEEKLY (0.5ML= 50 UNITS) 2 mL 0   Current Facility-Administered Medications  Medication Dose Route Frequency Provider Last Rate Last Admin   0.9 %  sodium chloride  infusion  500 mL Intravenous Once Avram Lupita BRAVO, MD        Allergies as of 06/27/2024 - Review Complete 06/27/2024  Allergen Reaction Noted   Allegra [fexofenadine hcl] Hives 02/21/2013   Crestor  [rosuvastatin ] Other (See Comments) 11/29/2022   Hctz [hydrochlorothiazide ] Other (See Comments) 05/07/2016   Demerol  [meperidine ] Nausea And Vomiting 02/21/2013    Family History  Problem Relation Age of Onset   Hypertension Mother    Cervical cancer Mother    Cancer Father        type unknown   Emphysema Maternal Grandmother    Emphysema Maternal Grandfather    Other Paternal Grandmother        spinal cancer   COPD Other    Alcohol abuse Neg Hx    Diabetes Neg Hx    Early death Neg Hx    Heart disease Neg Hx    Hyperlipidemia Neg Hx    Kidney disease Neg Hx    Stroke Neg Hx    Colon cancer Neg Hx    Stomach cancer Neg Hx    Esophageal cancer Neg Hx    Rectal cancer Neg Hx    Liver cancer Neg Hx    Colon polyps Neg Hx     Social History   Socioeconomic History   Marital status: Married    Spouse name: Not on file   Number of children: 2   Years of education: Not on file   Highest education level: Associate degree: occupational, Scientist, product/process development, or vocational program  Occupational History   Occupation: MAINTENANCE    Employer: [employer]  Tobacco Use   Smoking status: Former    Current packs/day: 0.00    Types: Cigarettes    Quit date: 08/29/1978    Years since quitting: 45.8   Smokeless tobacco: Never  Vaping Use   Vaping status: Never Used  Substance and  Sexual Activity   Alcohol use: Yes    Comment: Occasional Drink- 1-2 times per month   Drug use: No   Sexual activity: Yes  Other Topics Concern   Not on file  Social History Narrative   Married, 2 daughters   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   Rare alcohol, former smoker no drugs no other tobacco      Right Handed    Lives in a one story home    Lives with wife  Has 2 grown children    Social Drivers of Corporate investment banker Strain: Low Risk  (04/01/2024)   Overall Financial Resource Strain (CARDIA)    Difficulty of Paying Living Expenses: Not hard at all  Food Insecurity: No Food Insecurity (04/01/2024)   Hunger Vital Sign    Worried About Running Out of Food in the Last Year: Never true    Ran Out of Food in the Last Year: Never true  Transportation Needs: No Transportation Needs (04/01/2024)   PRAPARE - Administrator, Civil Service (Medical): No    Lack of Transportation (Non-Medical): No  Physical Activity: Sufficiently Active (04/01/2024)   Exercise Vital Sign    Days of Exercise per Week: 2 days    Minutes of Exercise per Session: 120 min  Stress: No Stress Concern Present (04/01/2024)   [employer] of Occupational Health - Occupational Stress Questionnaire    Feeling of Stress: Only a little  Social Connections: Moderately Isolated (04/01/2024)   Social Connection and Isolation Panel    Frequency of Communication with Friends and Family: More than three times a week    Frequency of Social Gatherings with Friends and Family: Three times a week    Attends Religious Services: Patient declined    Active Member of Clubs or Organizations: No    Attends Engineer, structural: Not on file    Marital Status: Married  Catering manager Violence: Not on file    Review of Systems:  All other review of systems negative except as mentioned in the HPI.  Physical Exam: Vital signs BP (!) 135/92   Pulse 74    Temp (!) 97.4 F (36.3 C)   Ht 5' 9 (1.753 m)   Wt 251 lb (113.9 kg)   SpO2 99%   BMI 37.07 kg/m   General:   Alert,  Well-developed, well-nourished, pleasant and cooperative in NAD Lungs:  Clear throughout to auscultation.   Heart:  Regular rate and rhythm; no murmurs, clicks, rubs,  or gallops. Abdomen:  Soft, nontender and nondistended. Normal bowel sounds.   Neuro/Psych:  Alert and cooperative. Normal mood and affect. A and O x 3   @Ludie Hudon  CHARLENA Commander, MD, [facility] [phone] (pager) 06/27/2024 7:46 AM@

## 2024-06-27 ENCOUNTER — Ambulatory Visit: Admitting: Internal Medicine

## 2024-06-27 ENCOUNTER — Encounter: Payer: Self-pay | Admitting: Internal Medicine

## 2024-06-27 VITALS — BP 133/90 | HR 75 | Temp 97.4°F | Resp 13 | Ht 69.0 in | Wt 251.0 lb

## 2024-06-27 DIAGNOSIS — K5730 Diverticulosis of large intestine without perforation or abscess without bleeding: Secondary | ICD-10-CM

## 2024-06-27 DIAGNOSIS — Z1211 Encounter for screening for malignant neoplasm of colon: Secondary | ICD-10-CM

## 2024-06-27 MED ORDER — SODIUM CHLORIDE 0.9 % IV SOLN: 500.0000 mL | Freq: Once | INTRAVENOUS | Status: DC

## 2024-06-27 NOTE — Patient Instructions (Addendum)
 No polyps or cancer seen today.  You do have diverticulosis - thickened muscle rings and pouches in the colon wall. Please read the handout about this condition.  All else ok - normal including small intestine where it meets the colon there was inflammation there in 2014.  Next routine colonoscopy or other screening test in 10 years - 2035.  I appreciate the opportunity to care for you. Taylor CHARLENA Commander, MD, FACG   YOU HAD AN ENDOSCOPIC PROCEDURE TODAY AT THE [facility]:   Refer to the procedure report that was given to you for any specific questions about what was found during the examination.  If the procedure report does not answer your questions, please call your gastroenterologist to clarify.  If you requested that your care partner not be given the details of your procedure findings, then the procedure report has been included in a sealed envelope for you to review at your convenience later.  YOU SHOULD EXPECT: Some feelings of bloating in the abdomen. Passage of more gas than usual.  Walking can help get rid of the air that was put into your GI tract during the procedure and reduce the bloating. If you had a lower endoscopy (such as a colonoscopy or flexible sigmoidoscopy) you may notice spotting of blood in your stool or on the toilet paper. If you underwent a bowel prep for your procedure, you may not have a normal bowel movement for a few days.  Please Note:  You might notice some irritation and congestion in your nose or some drainage.  This is from the oxygen used during your procedure.  There is no need for concern and it should clear up in a day or so.  SYMPTOMS TO REPORT IMMEDIATELY:  Following lower endoscopy (colonoscopy or flexible sigmoidoscopy):  Excessive amounts of blood in the stool  Significant tenderness or worsening of abdominal pains  Swelling of the abdomen that is new, acute  Fever of 100F or higher  Resume previous diet Continue present  medications  For urgent or emergent issues, a gastroenterologist can be reached at any hour by calling [phone]. Do not use MyChart messaging for urgent concerns.    DIET:  We do recommend a small meal at first, but then you may proceed to your regular diet.  Drink plenty of fluids but you should avoid alcoholic beverages for 24 hours.  ACTIVITY:  You should plan to take it easy for the rest of today and you should NOT DRIVE or use heavy machinery until tomorrow (because of the sedation medicines used during the test).    FOLLOW UP: Our staff will call the number listed on your records the next business day following your procedure.  We will call around 7:15- 8:00 am to check on you and address any questions or concerns that you may have regarding the information given to you following your procedure. If we do not reach you, we will leave a message.     If any biopsies were taken you will be contacted by phone or by letter within the next 1-3 weeks.  Please call us  at [phone] if you have not heard about the biopsies in 3 weeks.    SIGNATURES/CONFIDENTIALITY: You and/or your care partner have signed paperwork which will be entered into your electronic medical record.  These signatures attest to the fact that that the information above on your After Visit Summary has been reviewed and is understood.  Full responsibility of the confidentiality of  this discharge information lies with you and/or your care-partner.

## 2024-06-27 NOTE — Progress Notes (Signed)
 Report given to PACU, vss

## 2024-06-27 NOTE — Progress Notes (Signed)
 Pt's states no medical or surgical changes since previsit or office visit.

## 2024-06-27 NOTE — Op Note (Signed)
 [facility] Patient Name: Taylor Schroeder Procedure Date: 06/27/2024 7:53 AM MRN: [id number] Endoscopist: Lupita FORBES Commander , MD, [id number] Age: 58 Referring MD:  Date of Birth: [birth year] Gender: Male Account #: [id number] Procedure:                Colonoscopy Indications:              Screening for colorectal malignant neoplasm, Last                            colonoscopy: 2014 Medicines:                Monitored Anesthesia Care Procedure:                Pre-Anesthesia Assessment:                           - Prior to the procedure, a History and Physical                            was performed, and patient medications and                            allergies were reviewed. The patient's tolerance of                            previous anesthesia was also reviewed. The risks                            and benefits of the procedure and the sedation                            options and risks were discussed with the patient.                            All questions were answered, and informed consent                            was obtained. Prior Anticoagulants: The patient has                            taken no anticoagulant or antiplatelet agents. ASA                            Grade Assessment: III - A patient with severe                            systemic disease. After reviewing the risks and                            benefits, the patient was deemed in satisfactory                            condition to undergo the procedure.  After obtaining informed consent, the colonoscope                            was passed under direct vision. Throughout the                            procedure, the patient's blood pressure, pulse, and                            oxygen saturations were monitored continuously. The                            Olympus CF-HQ190L ([id number]) Colonoscope was                            introduced through the anus and advanced to  the the                            terminal ileum, with identification of the                            appendiceal orifice and IC valve. The colonoscopy                            was performed without difficulty. The patient                            tolerated the procedure well. The quality of the                            bowel preparation was good. The terminal ileum,                            ileocecal valve, appendiceal orifice, and rectum                            were photographed. The bowel preparation used was                            SUPREP via split dose instruction. Scope In: 7:58:51 AM Scope Out: 8:15:01 AM Scope Withdrawal Time: 0 hours 13 minutes 16 seconds  Total Procedure Duration: 0 hours 16 minutes 10 seconds  Findings:                 The perianal and digital rectal examinations were                            normal.                           A few small-mouthed diverticula were found in the                            sigmoid colon.  The terminal ileum appeared normal.                           The exam was otherwise without abnormality on                            direct and retroflexion views. Complications:            No immediate complications. Estimated Blood Loss:     Estimated blood loss: none. Impression:               - Diverticulosis in the sigmoid colon.                           - The examined portion of the ileum was normal.                           - The examination was otherwise normal on direct                            and retroflexion views.                           - No specimens collected. Recommendation:           - Patient has a contact number available for                            emergencies. The signs and symptoms of potential                            delayed complications were discussed with the                            patient. Return to normal activities tomorrow.                             Written discharge instructions were provided to the                            patient.                           - Resume previous diet.                           - Continue present medications.                           - Repeat colonoscopy in 10 years for screening                            purposes. Lupita FORBES Commander, MD 06/27/2024 8:26:08 AM This report has been signed electronically.

## 2024-06-28 ENCOUNTER — Telehealth: Payer: Self-pay

## 2024-06-28 NOTE — Telephone Encounter (Signed)
  Follow up Call-     06/27/2024    7:14 AM  Call back number  Post procedure Call Back phone  # [phone]  Permission to leave phone message Yes     Patient questions:  Do you have a fever, pain , or abdominal swelling? No. Pain Score  0 *  Have you tolerated food without any problems? Yes.    Have you been able to return to your normal activities? Yes.    Do you have any questions about your discharge instructions: Diet   No. Medications  No. Follow up visit  No.  Do you have questions or concerns about your Care? No.  Actions: * If pain score is 4 or above: No action needed, pain <4.

## 2024-07-02 ENCOUNTER — Ambulatory Visit: Admitting: Family Medicine

## 2024-07-02 ENCOUNTER — Ambulatory Visit: Payer: Self-pay

## 2024-07-02 ENCOUNTER — Encounter: Payer: Self-pay | Admitting: Family Medicine

## 2024-07-02 VITALS — BP 130/82 | HR 80 | Temp 98.2°F | Ht 69.0 in | Wt 241.0 lb

## 2024-07-02 DIAGNOSIS — R351 Nocturia: Secondary | ICD-10-CM

## 2024-07-02 DIAGNOSIS — R300 Dysuria: Secondary | ICD-10-CM

## 2024-07-02 DIAGNOSIS — R82998 Other abnormal findings in urine: Secondary | ICD-10-CM | POA: Diagnosis not present

## 2024-07-02 DIAGNOSIS — Z23 Encounter for immunization: Secondary | ICD-10-CM

## 2024-07-02 LAB — POCT URINALYSIS DIPSTICK
Bilirubin, UA: NEGATIVE
Blood, UA: NEGATIVE
Glucose, UA: NEGATIVE
Ketones, UA: NEGATIVE
Leukocytes, UA: NEGATIVE
Nitrite, UA: NEGATIVE
Protein, UA: POSITIVE — AB
Spec Grav, UA: 1.015 (ref 1.010–1.025)
Urobilinogen, UA: NEGATIVE U/dL — AB
pH, UA: 6 (ref 5.0–8.0)

## 2024-07-02 LAB — PSA: PSA: 3.36 ng/mL (ref 0.10–4.00)

## 2024-07-02 NOTE — Telephone Encounter (Signed)
 FYI Only or Action Required?: FYI only for provider.  Patient was last seen in primary care on 04/19/2024 by Joshua Debby CROME, MD.  Called Nurse Triage reporting Dysuria.  Symptoms began several days ago.  Interventions attempted: Dietary changes. Drinking lots of fluids  Symptoms are: unchanged.  Triage Disposition: See Physician Within 24 Hours  Patient/caregiver understands and will follow disposition?: Yes  Copied from CRM #[id number]. Topic: Clinical - Red Word Triage >> Jul 02, 2024  1:19 PM Suzen RAMAN wrote: Red Word that prompted transfer to Nurse Triage: discomfort with urination, intermittent pain and burning  Reason for Disposition  All other males with painful urination  Answer Assessment - Initial Assessment Questions 1. SEVERITY: How bad is the pain?  (e.g., Scale 1-10; mild, moderate, or severe)     Mild/moderate discomfort in pubic area  2. FREQUENCY: How many times have you had painful urination today?      Intermittent discomfort with urination.  3. PATTERN: Is pain present every time you urinate or just sometimes?      Intermittent discomfort, worse in the morning and when pt hasn't urinated in a while/bladder is full  4. ONSET: When did the painful urination start?      Friday of last week  5. FEVER: Do you have a fever? If Yes, ask: What is your temperature, how was it measured, and when did it start?     No  6. PAST UTI: Have you had a urine infection before? If Yes, ask: When was the last time? and What happened that time?      No  7. CAUSE: What do you think is causing the painful urination?      Had colonoscopy on Wednesday  8. OTHER SYMPTOMS: Do you have any other symptoms? (e.g., flank pain, penis discharge, scrotal pain, blood in urine)     Pt reports feeling like he is only emptying bladder about halfway. No blood, discharge or scrotal pain. No new onset of flank pain.  Protocols used: Urination Pain - Male-A-AH

## 2024-07-03 ENCOUNTER — Ambulatory Visit: Payer: Self-pay | Admitting: Family Medicine

## 2024-07-03 LAB — BASIC METABOLIC PANEL WITH GFR
BUN: 24 mg/dL — ABNORMAL HIGH (ref 6–23)
CO2: 25 meq/L (ref 19–32)
Calcium: 9.7 mg/dL (ref 8.4–10.5)
Chloride: 106 meq/L (ref 96–112)
Creatinine, Ser: 1.27 mg/dL (ref 0.40–1.50)
GFR: 61.98 mL/min (ref >60.00)
Glucose, Bld: 94 mg/dL (ref 70–99)
Potassium: 4.3 meq/L (ref 3.5–5.1)
Sodium: 142 meq/L (ref 135–145)

## 2024-07-03 LAB — URINE CULTURE: Result:: NO GROWTH

## 2024-07-04 NOTE — Progress Notes (Signed)
 Assessment & Plan Dysuria Urinalysis negative for infection, normal PSA in July. Possible overactive bladder or prostate irritation. Orders:   POCT urinalysis dipstick   Urine Culture; Future   GC/Chlamydia Probe Amp; Future  Dark urine - Order blood work to assess kidney function and prostate level. Orders:   Basic metabolic panel with GFR; Future  Nocturia Dark urine - Order blood work to assess kidney function and prostate level. Orders:   PSA; Future  Immunization due  Orders:   Flu vaccine trivalent PF, 6mos and older(Flulaval,Afluria,Fluarix,Fluzone)    Follow up plan: Return if symptoms worsen or fail to improve.  Niki Rung, MSN, APRN, FNP-C  Subjective:  HPI: Taylor Schroeder is a 58 y.o. male presenting on 07/02/2024 for Urinary Frequency (Started Friday (colonoscopy on Wed) /Urine is dark, frequency, nocturia, burns some of the time )  Discussed the use of AI scribe software for clinical note transcription with the patient, who gave verbal consent to proceed.  He has experienced dark urine and increased frequency of urination since a few days after his colonoscopy. He describes an urgent need to urinate with weak urine flow and discomfort, but no burning sensation or pain during urination. He urinated ten times yesterday with minimal output each time. The discomfort is inconsistent and sometimes feels like a bladder spasm. No penile discharge, swelling, or testicular pain.  He recalls a recent incident about a month ago that raises concerns about a possible sexually transmitted infection (STI). He denies symptoms typically associated with STIs, such as discharge or swelling.  He has a history of constipation and uses Milk of Magnesia as needed for relief. He reports that the colonoscopy preparation was more painful this time compared to previous experiences, causing irritation around the anal area.      ROS: Negative unless specifically indicated above in  HPI.   Relevant past medical history reviewed and updated as indicated.   Allergies and medications reviewed and updated.   Current Outpatient Medications:    [employer] D3 50 MCG (2000 UT) CAPS, TAKE 2 CAPSULES BY MOUTH EVERY DAY, Disp: 180 capsule, Rfl: 1   esomeprazole  (NEXIUM ) 40 MG capsule, TAKE 1 CAPSULE BY MOUTH EVERY MORNING BEFORE BREAKFAST, Disp: 90 capsule, Rfl: 3   fluocinonide  cream (LIDEX ) 0.05 %, Apply 1 Application topically 2 (two) times daily., Disp: 120 g, Rfl: 1   ibuprofen  (ADVIL ) 800 MG tablet, Take 800 mg by mouth daily at 6 (six) AM., Disp: , Rfl:    meclizine  (ANTIVERT ) 12.5 MG tablet, Take 1 tablet (12.5 mg total) by mouth 3 (three) times daily as needed for dizziness., Disp: 15 tablet, Rfl: 0   telmisartan  (MICARDIS ) 80 MG tablet, TAKE 1 TABLET BY MOUTH EVERY DAY, Disp: 90 tablet, Rfl: 0   ZEPBOUND  2.5 MG/0.5ML injection vial, INJECT 0.5 ML (2.5 MG) UNDER THE SKIN ONCE WEEKLY (0.5ML= 50 UNITS), Disp: 2 mL, Rfl: 0   Pitavastatin  Calcium  2 MG TABS, Take 1 tablet (2 mg total) by mouth daily. (Patient not taking: No sig reported), Disp: 90 tablet, Rfl: 1  Allergies  Allergen Reactions   Allegra [Fexofenadine Hcl] Hives   Crestor  [Rosuvastatin ] Other (See Comments)    Myalgias    Hctz [Hydrochlorothiazide ] Other (See Comments)    Weak and muscle cramps    Demerol  [Meperidine ] Nausea And Vomiting    Bad things happen when I take it    Objective:   BP 130/82   Pulse 80   Temp 98.2 F (36.8 C)  Ht 5' 9 (1.753 m)   Wt 241 lb (109.3 kg)   BMI 35.59 kg/m    Physical Exam Vitals reviewed.  Constitutional:      General: He is not in acute distress.    Appearance: Normal appearance. He is not ill-appearing, toxic-appearing or diaphoretic.  HENT:     Head: Normocephalic and atraumatic.  Eyes:     General: No scleral icterus.       Right eye: No discharge.        Left eye: No discharge.     Conjunctiva/sclera: Conjunctivae normal.  Cardiovascular:      Rate and Rhythm: Normal rate.  Pulmonary:     Effort: Pulmonary effort is normal. No respiratory distress.  Abdominal:     Tenderness: There is no right CVA tenderness or left CVA tenderness.  Musculoskeletal:        General: Normal range of motion.     Cervical back: Normal range of motion.  Skin:    General: Skin is warm and dry.  Neurological:     Mental Status: He is alert and oriented to person, place, and time. Mental status is at baseline.  Psychiatric:        Mood and Affect: Mood normal.        Behavior: Behavior normal.        Thought Content: Thought content normal.        Judgment: Judgment normal.

## 2024-07-05 LAB — GC/CHLAMYDIA PROBE AMP
Chlamydia trachomatis, NAA: NEGATIVE
Neisseria Gonorrhoeae by PCR: NEGATIVE

## 2024-07-30 ENCOUNTER — Encounter: Payer: Self-pay | Admitting: Radiology

## 2024-07-31 ENCOUNTER — Encounter: Payer: Self-pay | Admitting: Internal Medicine

## 2024-07-31 DIAGNOSIS — G4730 Sleep apnea, unspecified: Secondary | ICD-10-CM

## 2024-07-31 DIAGNOSIS — K760 Fatty (change of) liver, not elsewhere classified: Secondary | ICD-10-CM

## 2024-07-31 MED ORDER — ZEPBOUND 2.5 MG/0.5ML ~~LOC~~ SOLN: 2.5000 mg | SUBCUTANEOUS | 1 refills | Status: DC

## 2024-09-11 ENCOUNTER — Other Ambulatory Visit: Payer: Self-pay | Admitting: Internal Medicine

## 2024-09-11 DIAGNOSIS — I10 Essential (primary) hypertension: Secondary | ICD-10-CM

## 2024-10-19 ENCOUNTER — Telehealth: Payer: Self-pay

## 2024-10-19 NOTE — Telephone Encounter (Signed)
 Please advise the medication refill request with the dosage change.

## 2024-10-19 NOTE — Telephone Encounter (Signed)
 Copied from CRM [id number]. Topic: Clinical - Medication Refill >> Oct 19, 2024  3:48 PM Burnard DEL wrote: Medication: tirzepatide  (ZEPBOUND ) 2.5 MG/0.5ML injection vial (Patient would like to know if he could have the next dosage after 0.5 dosage)  Has the patient contacted their pharmacy? Yes (Agent: If no, request that the patient contact the pharmacy for the refill. If patient does not wish to contact the pharmacy document the reason why and proceed with request.) (Agent: If yes, when and what did the pharmacy advise?)  This is the patient's preferred pharmacy:    [facility] [place], [place] - [phone] [place] [address] Phone: [phone] Fax: [phone]  Is this the correct pharmacy for this prescription? Yes If no, delete pharmacy and type the correct one.   Has the prescription been filled recently? No  Is the patient out of the medication? No(have 2 injections left)  Has the patient been seen for an appointment in the last year OR does the patient have an upcoming appointment? Yes  Can we respond through MyChart? Yes  Agent: Please be advised that Rx refills may take up to 3 business days. We ask that you follow-up with your pharmacy.

## 2024-10-20 ENCOUNTER — Other Ambulatory Visit: Payer: Self-pay | Admitting: Internal Medicine

## 2024-10-23 ENCOUNTER — Ambulatory Visit: Admitting: Internal Medicine

## 2024-10-23 ENCOUNTER — Encounter: Payer: Self-pay | Admitting: Internal Medicine

## 2024-10-23 VITALS — BP 156/90 | HR 93 | Temp 97.6°F | Resp 16 | Ht 69.0 in | Wt 244.6 lb

## 2024-10-23 DIAGNOSIS — I10 Essential (primary) hypertension: Secondary | ICD-10-CM

## 2024-10-23 DIAGNOSIS — Z6836 Body mass index (BMI) 36.0-36.9, adult: Secondary | ICD-10-CM

## 2024-10-23 DIAGNOSIS — E785 Hyperlipidemia, unspecified: Secondary | ICD-10-CM

## 2024-10-23 LAB — BASIC METABOLIC PANEL WITH GFR
BUN: 24 mg/dL — ABNORMAL HIGH (ref 6–23)
CO2: 27 meq/L (ref 19–32)
Calcium: 9.4 mg/dL (ref 8.4–10.5)
Chloride: 105 meq/L (ref 96–112)
Creatinine, Ser: 1.18 mg/dL (ref 0.40–1.50)
GFR: 67.55 mL/min
Glucose, Bld: 81 mg/dL (ref 70–99)
Potassium: 3.9 meq/L (ref 3.5–5.1)
Sodium: 140 meq/L (ref 135–145)

## 2024-10-23 LAB — CBC WITH DIFFERENTIAL/PLATELET
Basophils Absolute: 0.1 10*3/uL (ref 0.0–0.1)
Basophils Relative: 0.9 % (ref 0.0–3.0)
Eosinophils Absolute: 0.2 10*3/uL (ref 0.0–0.7)
Eosinophils Relative: 1.8 % (ref 0.0–5.0)
HCT: 41.9 % (ref 39.0–52.0)
Hemoglobin: 14.6 g/dL (ref 13.0–17.0)
Lymphocytes Relative: 35.6 % (ref 12.0–46.0)
Lymphs Abs: 3 10*3/uL (ref 0.7–4.0)
MCHC: 34.8 g/dL (ref 30.0–36.0)
MCV: 88.6 fl (ref 78.0–100.0)
Monocytes Absolute: 0.7 10*3/uL (ref 0.1–1.0)
Monocytes Relative: 8.5 % (ref 3.0–12.0)
Neutro Abs: 4.5 10*3/uL (ref 1.4–7.7)
Neutrophils Relative %: 53.2 % (ref 43.0–77.0)
Platelets: 275 10*3/uL (ref 150.0–400.0)
RBC: 4.73 Mil/uL (ref 4.22–5.81)
RDW: 13.5 % (ref 11.5–15.5)
WBC: 8.4 10*3/uL (ref 4.0–10.5)

## 2024-10-23 LAB — URINALYSIS, ROUTINE W REFLEX MICROSCOPIC
Bilirubin Urine: NEGATIVE
Hgb urine dipstick: NEGATIVE
Ketones, ur: NEGATIVE
Leukocytes,Ua: NEGATIVE
Nitrite: NEGATIVE
Specific Gravity, Urine: >=1.03 — AB (ref 1.000–1.030)
Total Protein, Urine: NEGATIVE
Urine Glucose: NEGATIVE
Urobilinogen, UA: 0.2 (ref 0.0–1.0)
pH: 6 (ref 5.0–8.0)

## 2024-10-23 MED ORDER — ZEPBOUND 7.5 MG/0.5ML ~~LOC~~ SOAJ: 7.5000 mg | SUBCUTANEOUS | 0 refills | Status: AC

## 2024-10-23 MED ORDER — TIRZEPATIDE-WEIGHT MANAGEMENT 5 MG/0.5ML ~~LOC~~ SOLN: 5.0000 mg | SUBCUTANEOUS | 0 refills | Status: AC

## 2024-10-23 MED ORDER — AMLODIPINE BESYLATE 5 MG PO TABS: 5.0000 mg | ORAL_TABLET | Freq: Every day | ORAL | 0 refills | Status: AC

## 2024-10-23 MED ORDER — PITAVASTATIN CALCIUM 2 MG PO TABS: 2.0000 mg | ORAL_TABLET | Freq: Every day | ORAL | 1 refills | Status: AC

## 2024-10-23 NOTE — Telephone Encounter (Signed)
 Patient has been scheduled.

## 2024-10-23 NOTE — Progress Notes (Unsigned)
 "  Subjective:  Patient ID: Taylor Schroeder, male    DOB: [birth year]  Age: 58 y.o. MRN: [id number]  CC: Hypertension   HPI MARQUIES WANAT presents for f/up ---  Discussed the use of AI scribe software for clinical note transcription with the patient, who gave verbal consent to proceed.  History of Present Illness Taylor Schroeder is a 59 year old male with hypertension who presents for a follow-up visit.  No headache, blurred vision, chest pain, or shortness of breath. He is currently taking telmisartan , usually in the mornings, and notes that his blood pressure readings at home have been in the upper 80s.  He is using Zepbound  at a dose of 5 mg. Initially, he experienced nausea when restarting the medication, which was managed with nesina or Imitrex. Nausea only occurs if he consumes something very sweet or pizza. No vomiting, abdominal pain, or constipation. He consumes a lot of pickles.  He recalls an episode of tachycardia lasting about six hours after drinking alcohol at a concert in September. He rarely drinks alcohol now, and when he does, it is only a sip. He does not smoke.  He mentions being under financial stress due to his wife quitting her job, which has increased his workload. No thoughts of self-harm, stating that he copes with stress by working more.     Outpatient Medications Prior to Visit  Medication Sig Dispense Refill   [employer] D3 50 MCG (2000 UT) CAPS TAKE 2 CAPSULES BY MOUTH EVERY DAY 180 capsule 1   esomeprazole  (NEXIUM ) 40 MG capsule TAKE 1 CAPSULE BY MOUTH EVERY MORNING BEFORE BREAKFAST 90 capsule 3   fluocinonide  cream (LIDEX ) 0.05 % Apply 1 Application topically 2 (two) times daily. 120 g 1   ibuprofen  (ADVIL ) 600 MG tablet Take 600 mg by mouth daily.     meclizine  (ANTIVERT ) 12.5 MG tablet Take 1 tablet (12.5 mg total) by mouth 3 (three) times daily as needed for dizziness. 15 tablet 0   tadalafil  (CIALIS ) 5 MG tablet Take 5 mg by mouth daily.      telmisartan  (MICARDIS ) 80 MG tablet TAKE 1 TABLET BY MOUTH EVERY DAY 90 tablet 0   tirzepatide  (ZEPBOUND ) 2.5 MG/0.5ML injection vial Inject 2.5 mg into the skin once a week. 2 mL 1   ibuprofen  (ADVIL ) 800 MG tablet Take 800 mg by mouth daily at 6 (six) AM.     Pitavastatin  Calcium  2 MG TABS Take 1 tablet (2 mg total) by mouth daily. (Patient not taking: Reported on 10/23/2024) 90 tablet 1   No facility-administered medications prior to visit.    ROS Review of Systems  Objective:  BP (!) 156/90 (BP Location: Left Arm, Patient Position: Sitting, Cuff Size: Normal)   Pulse 93   Temp 97.6 F (36.4 C) (Oral)   Resp 16   Ht 5' 9 (1.753 m)   Wt 244 lb 9.6 oz (110.9 kg)   SpO2 96%   BMI 36.12 kg/m   BP Readings from Last 3 Encounters:  10/23/24 (!) 156/90  07/02/24 130/82  06/27/24 (!) 133/90    Wt Readings from Last 3 Encounters:  10/23/24 244 lb 9.6 oz (110.9 kg)  07/02/24 241 lb (109.3 kg)  06/27/24 251 lb (113.9 kg)    Physical Exam Vitals reviewed.  Constitutional:      Appearance: Normal appearance.  HENT:     Nose: Nose normal.     Mouth/Throat:     Mouth: Mucous membranes are moist.  Eyes:     General: No scleral icterus.    Conjunctiva/sclera: Conjunctivae normal.  Cardiovascular:     Rate and Rhythm: Normal rate and regular rhythm.     Heart sounds: No murmur heard.    No friction rub. No gallop.     Comments: EKG--- SR with 1st degree AV block, 81 bpm No LVH, Q waves, or ST/T wave changes  Pulmonary:     Effort: Pulmonary effort is normal.     Breath sounds: No stridor. No wheezing, rhonchi or rales.  Abdominal:     General: Abdomen is flat. Bowel sounds are normal.     Palpations: There is no mass.     Tenderness: There is no abdominal tenderness. There is no guarding.     Hernia: No hernia is present.  Musculoskeletal:        General: Normal range of motion.     Cervical back: Neck supple.     Right lower leg: No edema.     Left lower leg: No  edema.  Lymphadenopathy:     Cervical: No cervical adenopathy.  Skin:    General: Skin is warm and dry.     Coloration: Skin is not jaundiced.     Findings: No bruising.  Neurological:     General: No focal deficit present.     Mental Status: He is alert. Mental status is at baseline.  Psychiatric:        Mood and Affect: Mood normal.        Behavior: Behavior normal.     Lab Results  Component Value Date   WBC 7.3 04/19/2024   HGB 14.4 04/19/2024   HCT 42.2 04/19/2024   PLT 282.0 04/19/2024   GLUCOSE 94 07/02/2024   CHOL 232 (H) 04/19/2024   TRIG 186.0 (H) 04/19/2024   HDL 55.40 04/19/2024   LDLDIRECT 134.0 05/03/2019   LDLCALC 139 (H) 04/19/2024   ALT 22 04/19/2024   AST 19 04/19/2024   NA 142 07/02/2024   K 4.3 07/02/2024   CL 106 07/02/2024   CREATININE 1.27 07/02/2024   BUN 24 (H) 07/02/2024   CO2 25 07/02/2024   TSH 2.27 04/19/2024   PSA 3.36 07/02/2024   INR 1.0 11/29/2022   HGBA1C 5.6 04/19/2024    MR BRAIN WO CONTRAST Result Date: 09/01/2023 CLINICAL DATA:  Neuro deficit, acute, stroke suspected. EXAM: MRI HEAD WITHOUT CONTRAST TECHNIQUE: Multiplanar, multiecho pulse sequences of the brain and surrounding structures were obtained without intravenous contrast. COMPARISON:  MRI of the brain June 12, 2019. FINDINGS: Brain: No acute infarction, hemorrhage, hydrocephalus, extra-axial collection or mass lesion. Small amount of scattered foci of T2 hyperintensity within the white matter of the cerebral hemispheres, nonspecific. Vascular: Normal flow voids. Skull and upper cervical spine: Normal marrow signal. Sinuses/Orbits: Paranasal sinuses are clear, the orbits are maintained. Bilateral mastoid effusion, right greater than left similar to prior MRI. Other: None. IMPRESSION: 1. No acute intracranial abnormality. 2. Small amount of scattered foci of T2 hyperintensity within the white matter of the cerebral hemispheres, nonspecific but may represent early chronic  microvascular ischemic changes. 3. Bilateral mastoid effusion, right greater than left, similar to prior MRI. Electronically Signed   By: Katyucia  de Macedo Rodrigues M.D.   On: 09/01/2023 14:55   The 10-year ASCVD risk score (Arnett DK, et al., 2019) is: 13.8%   Values used to calculate the score:     Age: 41 years     Clinically relevant sex: Male  Is Non-Hispanic African American: No     Diabetic: No     Tobacco smoker: No     Systolic Blood Pressure: 156 mmHg     Is BP treated: Yes     HDL Cholesterol: 55.4 mg/dL     Total Cholesterol: 232 mg/dL   Assessment & Plan:  Essential hypertension -     EKG 12-Lead -     Basic metabolic panel with GFR; Future -     CBC with Differential/Platelet; Future -     amLODIPine  Besylate; Take 1 tablet (5 mg total) by mouth daily.  Dispense: 90 tablet; Refill: 0 -     Aldosterone + renin activity w/ ratio; Future -     Urinalysis, Routine w reflex microscopic; Future -     AMB Referral [facility] Care Management -     CT CARDIAC SCORING ([facility] LOCATIONS ONLY); Future  Hyperlipidemia with target LDL less than 100 -     Pitavastatin  Calcium ; Take 1 tablet (2 mg total) by mouth daily.  Dispense: 90 tablet; Refill: 1 -     AMB Referral [facility] Care Management -     CT CARDIAC SCORING ([facility] LOCATIONS ONLY); Future  Severe obesity (BMI >= 40) (HCC) -     Tirzepatide -Weight Management; Inject 5 mg into the skin once a week.  Dispense: 4 mL; Refill: 0 -     Zepbound ; Inject 7.5 mg into the skin once a week.  Dispense: 4 mL; Refill: 0     Follow-up: Return in about 3 months (around 01/21/2025).  Debby Molt, MD "

## 2024-10-23 NOTE — Patient Instructions (Signed)
 Hypertension, Adult High blood pressure (hypertension) is when the force of blood pumping through the arteries is too strong. The arteries are the blood vessels that carry blood from the heart throughout the body. Hypertension forces the heart to work harder to pump blood and may cause arteries to become narrow or stiff. Untreated or uncontrolled hypertension can lead to a heart attack, heart failure, a stroke, kidney disease, and other problems. A blood pressure reading consists of a higher number over a lower number. Ideally, your blood pressure should be below 120/80. The first ("top") number is called the systolic pressure. It is a measure of the pressure in your arteries as your heart beats. The second ("bottom") number is called the diastolic pressure. It is a measure of the pressure in your arteries as the heart relaxes. What are the causes? The exact cause of this condition is not known. There are some conditions that result in high blood pressure. What increases the risk? Certain factors may make you more likely to develop high blood pressure. Some of these risk factors are under your control, including: Smoking. Not getting enough exercise or physical activity. Being overweight. Having too much fat, sugar, calories, or salt (sodium) in your diet. Drinking too much alcohol. Other risk factors include: Having a personal history of heart disease, diabetes, high cholesterol, or kidney disease. Stress. Having a family history of high blood pressure and high cholesterol. Having obstructive sleep apnea. Age. The risk increases with age. What are the signs or symptoms? High blood pressure may not cause symptoms. Very high blood pressure (hypertensive crisis) may cause: Headache. Fast or irregular heartbeats (palpitations). Shortness of breath. Nosebleed. Nausea and vomiting. Vision changes. Severe chest pain, dizziness, and seizures. How is this diagnosed? This condition is diagnosed by  measuring your blood pressure while you are seated, with your arm resting on a flat surface, your legs uncrossed, and your feet flat on the floor. The cuff of the blood pressure monitor will be placed directly against the skin of your upper arm at the level of your heart. Blood pressure should be measured at least twice using the same arm. Certain conditions can cause a difference in blood pressure between your right and left arms. If you have a high blood pressure reading during one visit or you have normal blood pressure with other risk factors, you may be asked to: Return on a different day to have your blood pressure checked again. Monitor your blood pressure at home for 1 week or longer. If you are diagnosed with hypertension, you may have other blood or imaging tests to help your health care provider understand your overall risk for other conditions. How is this treated? This condition is treated by making healthy lifestyle changes, such as eating healthy foods, exercising more, and reducing your alcohol intake. You may be referred for counseling on a healthy diet and physical activity. Your health care provider may prescribe medicine if lifestyle changes are not enough to get your blood pressure under control and if: Your systolic blood pressure is above 130. Your diastolic blood pressure is above 80. Your personal target blood pressure may vary depending on your medical conditions, your age, and other factors. Follow these instructions at home: Eating and drinking  Eat a diet that is high in fiber and potassium, and low in sodium, added sugar, and fat. An example of this eating plan is called the DASH diet. DASH stands for Dietary Approaches to Stop Hypertension. To eat this way: Eat  plenty of fresh fruits and vegetables. Try to fill one half of your plate at each meal with fruits and vegetables. Eat whole grains, such as whole-wheat pasta, brown rice, or whole-grain bread. Fill about one  fourth of your plate with whole grains. Eat or drink low-fat dairy products, such as skim milk or low-fat yogurt. Avoid fatty cuts of meat, processed or cured meats, and poultry with skin. Fill about one fourth of your plate with lean proteins, such as fish, chicken without skin, beans, eggs, or tofu. Avoid pre-made and processed foods. These tend to be higher in sodium, added sugar, and fat. Reduce your daily sodium intake. Many people with hypertension should eat less than 1,500 mg of sodium a day. Do not drink alcohol if: Your health care provider tells you not to drink. You are pregnant, may be pregnant, or are planning to become pregnant. If you drink alcohol: Limit how much you have to: 0-1 drink a day for women. 0-2 drinks a day for men. Know how much alcohol is in your drink. In the [place] one drink equals one 12 oz bottle of beer (355 mL), one 5 oz glass of wine (148 mL), or one 1 oz glass of hard liquor (44 mL). Lifestyle  Work with your health care provider to maintain a healthy body weight or to lose weight. Ask what an ideal weight is for you. Get at least 30 minutes of exercise that causes your heart to beat faster (aerobic exercise) most days of the week. Activities may include walking, swimming, or biking. Include exercise to strengthen your muscles (resistance exercise), such as Pilates or lifting weights, as part of your weekly exercise routine. Try to do these types of exercises for 30 minutes at least 3 days a week. Do not use any products that contain nicotine or tobacco. These products include cigarettes, chewing tobacco, and vaping devices, such as e-cigarettes. If you need help quitting, ask your health care provider. Monitor your blood pressure at home as told by your health care provider. Keep all follow-up visits. This is important. Medicines Take over-the-counter and prescription medicines only as told by your health care provider. Follow directions carefully. Blood  pressure medicines must be taken as prescribed. Do not skip doses of blood pressure medicine. Doing this puts you at risk for problems and can make the medicine less effective. Ask your health care provider about side effects or reactions to medicines that you should watch for. Contact a health care provider if you: Think you are having a reaction to a medicine you are taking. Have headaches that keep coming back (recurring). Feel dizzy. Have swelling in your ankles. Have trouble with your vision. Get help right away if you: Develop a severe headache or confusion. Have unusual weakness or numbness. Feel faint. Have severe pain in your chest or abdomen. Vomit repeatedly. Have trouble breathing. These symptoms may be an emergency. Get help right away. Call 911. Do not wait to see if the symptoms will go away. Do not drive yourself to the hospital. Summary Hypertension is when the force of blood pumping through your arteries is too strong. If this condition is not controlled, it may put you at risk for serious complications. Your personal target blood pressure may vary depending on your medical conditions, your age, and other factors. For most people, a normal blood pressure is less than 120/80. Hypertension is treated with lifestyle changes, medicines, or a combination of both. Lifestyle changes include losing weight, eating a healthy,  low-sodium diet, exercising more, and limiting alcohol. This information is not intended to replace advice given to you by your health care provider. Make sure you discuss any questions you have with your health care provider. Document Revised: 07/21/2021 Document Reviewed: 07/21/2021 Elsevier Patient Education  2024 [employer].

## 2024-10-24 ENCOUNTER — Ambulatory Visit: Payer: Self-pay | Admitting: Internal Medicine

## 2024-10-24 ENCOUNTER — Telehealth: Payer: Self-pay

## 2024-10-24 NOTE — Progress Notes (Signed)
 Care Guide Pharmacy Note  10/24/2024 Name: Taylor Schroeder MRN: [id number] DOB: 1966-07-01  Referred By: Joshua Debby CROME, MD Reason for referral: No chief complaint on file.   Taylor Schroeder is a 59 y.o. year old male who is a primary care patient of Joshua Debby CROME, MD.  Taylor Schroeder was referred to the pharmacist for assistance related to: HTN and HLD  Successful contact was made with the patient to discuss pharmacy services including being ready for the pharmacist to call at least 5 minutes before the scheduled appointment time and to have medication bottles and any blood pressure readings ready for review. The patient agreed to meet with the pharmacist via telephone visit on 11/02/2024.  [facility] [place], [facility] Guide Direct Dial: [phone]  Fax: [phone]

## 2024-11-02 ENCOUNTER — Other Ambulatory Visit

## 2024-11-13 ENCOUNTER — Other Ambulatory Visit

## 2024-11-14 ENCOUNTER — Other Ambulatory Visit
